# Patient Record
Sex: Female | Born: 1955 | Race: White | Hispanic: No | Marital: Married | Smoking: Never smoker
Health system: Southern US, Community
[De-identification: ages and names within clinical notes are randomized; demographics above are authoritative.]

## PROBLEM LIST (undated history)

## (undated) DIAGNOSIS — O99419 Diseases of the circulatory system complicating pregnancy, unspecified trimester: Secondary | ICD-10-CM

## (undated) DIAGNOSIS — E785 Hyperlipidemia, unspecified: Secondary | ICD-10-CM

## (undated) DIAGNOSIS — Z9889 Other specified postprocedural states: Secondary | ICD-10-CM

## (undated) DIAGNOSIS — R112 Nausea with vomiting, unspecified: Secondary | ICD-10-CM

## (undated) DIAGNOSIS — I341 Nonrheumatic mitral (valve) prolapse: Secondary | ICD-10-CM

## (undated) DIAGNOSIS — C50919 Malignant neoplasm of unspecified site of unspecified female breast: Secondary | ICD-10-CM

## (undated) DIAGNOSIS — M549 Dorsalgia, unspecified: Secondary | ICD-10-CM

## (undated) DIAGNOSIS — C801 Malignant (primary) neoplasm, unspecified: Secondary | ICD-10-CM

## (undated) DIAGNOSIS — M1990 Unspecified osteoarthritis, unspecified site: Secondary | ICD-10-CM

## (undated) DIAGNOSIS — G43109 Migraine with aura, not intractable, without status migrainosus: Secondary | ICD-10-CM

## (undated) DIAGNOSIS — K635 Polyp of colon: Secondary | ICD-10-CM

## (undated) HISTORY — DX: Polyp of colon: K63.5

## (undated) HISTORY — DX: Migraine with aura, not intractable, without status migrainosus: G43.109

## (undated) HISTORY — DX: Hyperlipidemia, unspecified: E78.5

## (undated) HISTORY — DX: Dorsalgia, unspecified: M54.9

## (undated) HISTORY — DX: Unspecified osteoarthritis, unspecified site: M19.90

---
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
## 1975-03-02 HISTORY — PX: RHINOPLASTY: SUR1284

## 1996-03-01 HISTORY — PX: ENDOMETRIAL ABLATION: SHX621

## 2001-05-04 ENCOUNTER — Other Ambulatory Visit: Admission: RE | Admit: 2001-05-04 | Discharge: 2001-05-04 | Payer: Self-pay | Admitting: Internal Medicine

## 2002-04-13 ENCOUNTER — Other Ambulatory Visit: Admission: RE | Admit: 2002-04-13 | Discharge: 2002-04-13 | Payer: Self-pay | Admitting: *Deleted

## 2003-01-28 ENCOUNTER — Ambulatory Visit (HOSPITAL_BASED_OUTPATIENT_CLINIC_OR_DEPARTMENT_OTHER): Admission: RE | Admit: 2003-01-28 | Discharge: 2003-01-28 | Payer: Self-pay | Admitting: Obstetrics and Gynecology

## 2003-01-28 ENCOUNTER — Ambulatory Visit (HOSPITAL_COMMUNITY): Admission: RE | Admit: 2003-01-28 | Discharge: 2003-01-28 | Payer: Self-pay | Admitting: Obstetrics and Gynecology

## 2003-04-18 ENCOUNTER — Other Ambulatory Visit: Admission: RE | Admit: 2003-04-18 | Discharge: 2003-04-18 | Payer: Self-pay | Admitting: *Deleted

## 2004-05-18 ENCOUNTER — Other Ambulatory Visit: Admission: RE | Admit: 2004-05-18 | Discharge: 2004-05-18 | Payer: Self-pay | Admitting: Obstetrics and Gynecology

## 2005-03-01 HISTORY — PX: KNEE SURGERY: SHX244

## 2005-05-19 ENCOUNTER — Other Ambulatory Visit: Admission: RE | Admit: 2005-05-19 | Discharge: 2005-05-19 | Payer: Self-pay | Admitting: Obstetrics & Gynecology

## 2005-06-07 ENCOUNTER — Ambulatory Visit (HOSPITAL_BASED_OUTPATIENT_CLINIC_OR_DEPARTMENT_OTHER): Admission: RE | Admit: 2005-06-07 | Discharge: 2005-06-07 | Payer: Self-pay | Admitting: Orthopedic Surgery

## 2006-06-16 ENCOUNTER — Other Ambulatory Visit: Admission: RE | Admit: 2006-06-16 | Discharge: 2006-06-16 | Payer: Self-pay | Admitting: Obstetrics & Gynecology

## 2006-08-31 ENCOUNTER — Encounter (INDEPENDENT_AMBULATORY_CARE_PROVIDER_SITE_OTHER): Payer: Self-pay | Admitting: Gastroenterology

## 2006-08-31 ENCOUNTER — Ambulatory Visit (HOSPITAL_COMMUNITY): Admission: RE | Admit: 2006-08-31 | Discharge: 2006-08-31 | Payer: Self-pay | Admitting: Gastroenterology

## 2007-06-15 ENCOUNTER — Ambulatory Visit (HOSPITAL_BASED_OUTPATIENT_CLINIC_OR_DEPARTMENT_OTHER): Admission: RE | Admit: 2007-06-15 | Discharge: 2007-06-15 | Payer: Self-pay | Admitting: Orthopedic Surgery

## 2007-09-21 ENCOUNTER — Other Ambulatory Visit: Admission: RE | Admit: 2007-09-21 | Discharge: 2007-09-21 | Payer: Self-pay | Admitting: Obstetrics and Gynecology

## 2010-07-14 NOTE — Op Note (Signed)
[name], [name]              ACCOUNT NO.:  [id number]   MEDICAL RECORD NO.:  [id number]          PATIENT TYPE:  AMB   LOCATION:  ENDO                         FACILITY:  [facility]   PHYSICIAN:  Anselmo Rod, M.D.  DATE OF BIRTH:  1955/08/11   DATE OF PROCEDURE:  08/31/2006  DATE OF DISCHARGE:  08/31/2006                               OPERATIVE REPORT   PROCEDURE PERFORMED:  Colonoscopy with snare polypectomy x 1.   ENDOSCOPIST:  Anselmo Rod MD.   INSTRUMENT USED:  Pentax video colonoscope.   INDICATIONS FOR PROCEDURE:  A 55 year old female undergoing a screening  colonoscopy to rule out colonic polyps, masses, etc.   PREPROCEDURE PREPARATION:  Informed consent was procured from the  patient. The patient was fasted for 8 hours prior to the procedure, and  prepped with a bottle of magnesium citrate and a gallon of NuLytely the  night prior to the procedure.  Risks and benefits of the procedure  including a 10% missed rate of cancer and polyp were discussed with the  patient as well.   PREPROCEDURE PHYSICAL:  VITAL SIGNS:  The patient had stable vital  signs.  NECK:  Supple.  CHEST:  Clear to auscultation.  HEART:  S1-S2 regular.  ABDOMEN:  Soft with normal bowel sounds.   DESCRIPTION OF PROCEDURE:  The patient was placed in the left lateral  decubitus position and sedated with 100 mcg of Fentanyl and 9 mg of  Versed given intravenously in slow incremental doses. Once the patient  was adequately sedated and maintained on low-flow oxygen and continuous  cardiac monitoring, the Pentax video colonoscope was advanced from the  rectum to the cecum. A small sessile polyp was removed by a hot snare  from the proximal right colon.  The appendiceal orifice and the cecal  valve were clearly visualized and photographed. The terminal ileum  appeared healthy without lesions. There was some residual stool in the  right colon and cecum. Multiple washes were done.  There was no  evidence  of diverticulosis. Retroflexion in the rectum revealed no abnormalities.  The patient tolerated the procedure well without complications.   IMPRESSION:  1. Small sessile polyp removed by hot snare from the proximal right      colon.  2. Otherwise unrevealing colonoscopy up to the terminal ileum.  3. Some residual stool in the right colon.  Multiple washes done.  4. No evidence of diverticulosis.   RECOMMENDATIONS:  1. Await pathology results.  2. Avoid all nonsteroidals including Aspirin for the next 2 weeks.  3. Repeat colonoscopy depending on pathology results.  4. Outpatient follow up as need arises in the future.      Anselmo Rod, M.D.  Electronically Signed     JNM/MEDQ  D:  11/15/2006  T:  11/15/2006  Job:  [id number]   cc:   Edwena Felty. Romine, M.D.

## 2010-07-14 NOTE — Op Note (Signed)
[name], [name]              ACCOUNT NO.:  [id number]   MEDICAL RECORD NO.:  [id number]          PATIENT TYPE:  AMB   LOCATION:  DSC                          FACILITY:  [facility]   PHYSICIAN:  Katy Fitch. Sypher, M.D. 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   POSTOPERATIVE DIAGNOSES:  1. 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  2. Identification of osteocartilaginous loose bodies within the right      thumb IP joint and 4+ synovitis.   OPERATION:  1. Debridement of mucoid cyst, dorsal aspect right thumb.  2. Right thumb interphalangeal joint arthrotomy with removal of      osteocartilaginous loose bodies, synovectomy and debridement of      marginal osteophytes.   OPERATING SURGEON:  Dr. Josephine Igo.   ASSISTANT:  No assistant.   ANESTHESIA:  General sedation/2% lidocaine metacarpal head level block  right thumb.   SUPERVISING ANESTHESIOLOGIST:  Dr. Gypsy Balsam.   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  She was referred through the  courtesy of Dr. Frazier Butt of [facility] for evaluation  of a chronic right thumb mucoid cyst.  Clinical examination revealed a  large  cyst measuring nearly 8 mm in diameter that had drained  repeatedly.  She had slight radial deviation of her right thumb IP joint  and signs of degenerative arthritis including palpable marginal  osteophytes.   X-rays at the office demonstrated an osteocartilaginous loose body in  the radial dorsal recess of the IP joint and narrowing of the radial  compartment of the IP joint.   We recommended that she proceed with joint debridement and cyst  excision.  After informed consent, she is brought to the operating room  at this time.   PROCEDURE:  [name] was brought to the operating room and placed  in supine position on the operating table.   Following light sedation, the right thumb was anesthetized with 2%  lidocaine with a block placed at the metacarpal head level.   After 10 minutes, excellent anesthesia was achieved.   The entire right hand and arm were prepped with Betadine soap solution  and sterilely draped.  The thumb was exsanguinated with a gauze wrap and  a 1/2-inch Penrose drain placed at metacarpal head  level as a digital  tourniquet.   Procedure commenced with a curvilinear incision incorporating the sinus  tract of the cyst distally.  Subcutaneous tissues were carefully divided  taking care to identify the extensor tendon, dorsal veins, and dorsal  sensory nerves.   The cyst was circumferentially dissected and removed with a micro  rongeur.  The periosteum on the dorsal aspect of the distal phalanx was  debrided.  The sinus tract was followed back to the radial aspect of the  joint.  The capsule between the extensor tendon, terminal slip, and the  radial collateral ligament was resected followed by use of a micro  rongeur and curette to remove osteocartilaginous loose bodies.  A  complete synovectomy of the dorsal aspect of the IP joint was  accomplished with micro rongeur followed by debridement of marginal  osteophytes.  A 19-gauge blunt dental needle was  then used to power  irrigate the joint with approximately 60 mL of sterile saline.   Thereafter, the wound was inspected for bleeding points followed by  repair of the skin with horizontal mattress sutures of 5-0 nylon.   The tourniquet was released and hemostasis achieved by direct pressure  for approximately 5 minutes.   The thumb was dressed with Xeroflo sterile gauze and a Coban thumb spica  dressing.  There are no apparent complications.   Aftercare, [name] got prescriptions for Keflex 500 mg 1 p.o. q.8 h  x3 days as a prophylactic antibiotic and Percocet 5/325 one p.o. q. 4-6  hours p.r.n. pain 20 tablets without refill.      Katy Fitch Sypher, M.D.  Electronically Signed     RVS/MEDQ  D:  06/15/2007  T:  06/16/2007  Job:  [id number]   cc:   Frazier Butt, D.O.

## 2010-07-17 NOTE — Op Note (Signed)
[name], [name]                          ACCOUNT NO.:  [id number]   MEDICAL RECORD NO.:  [id number]                   PATIENT TYPE:  AMB   LOCATION:  NESC                                 FACILITY:  [facility]   PHYSICIAN:  Cynthia P. Romine, M.D.             DATE OF BIRTH:  [birth year]   DATE OF PROCEDURE:  01/28/2003  DATE OF DISCHARGE:                                 OPERATIVE REPORT   PREOPERATIVE DIAGNOSIS:  Menorrhagia.   POSTOPERATIVE DIAGNOSIS:  Menorrhagia.   PROCEDURE:  Endometrial ablation using hydrothermal ablator.   SURGEON:  Cynthia P. Romine, M.D.   ANESTHESIA:  General by LMA.   ESTIMATED BLOOD LOSS:  Minimal.   COMPLICATIONS:  None.   DESCRIPTION OF PROCEDURE:  The patient was taken to the operating room and  after the induction of adequate general anesthesia was placed in the dorsal  lithotomy position and prepped and draped in the usual fashion.  The bladder  was drained with a red rubber catheter.  Posterior weighted and anterior  skin retractors were placed and the cervix was grasped on the anterior lip  with a single-tooth tenaculum.  The cervix was dilated to a #23 Shawnie Pons, the  hysteroscope was introduced, the tubal ostia were visualized, photographic  documentation was taken.  The scope was withdrawn to just inside the  internal os and endometrial ablation was carried out in the usual fashion.  There were no complications.  Upon completing the procedure, documentation  photography was taken again of the post ablated endometrial cavity and the  instruments removed from the vagina.  The patient tolerated it well and went  in satisfactory condition to post anesthesia recovery.                                               Cynthia P. Romine, M.D.    CPR/MEDQ  D:  01/28/2003  T:  01/28/2003  Job:  [id number]

## 2010-07-17 NOTE — Op Note (Signed)
[name], BALENTINE              ACCOUNT NO.:  [id number]   MEDICAL RECORD NO.:  [id number]          PATIENT TYPE:  AMB   LOCATION:  DSC                          FACILITY:  [facility]   PHYSICIAN:  Robert A. Thurston Hole, M.D. DATE OF BIRTH:  01/19/1956   DATE OF PROCEDURE:  06/07/2005  DATE OF DISCHARGE:                                 OPERATIVE REPORT   PREOPERATIVE DIAGNOSIS:  Right knee medial and lateral meniscal tears.   POSTOPERATIVE DIAGNOSIS:  Right knee medial and lateral meniscal tears.   PROCEDURE:  Right knee EUA followed by arthroscopic partial medial and  lateral meniscectomies.   SURGEON:  Elana Alm. Thurston Hole, M.D.   ASSISTANT:  Julien Girt, P.A.   ANESTHESIA:  Local MAC.   OPERATIVE TIME:  30 minutes.   COMPLICATIONS:  None.   INDICATIONS FOR PROCEDURE:  [name] is a 49-year woman who has had  significant right knee pain for the past 3-4 months increasing in nature  with exam and MRI documenting medial meniscus tear who has failed  conservative care and is now to undergo arthroscopy.   DESCRIPTION:  [name] was brought to operating room on 06/07/2005 after a  knee block was placed in holding room y anesthesia.  She was placed on the  operative table supine position.  Her right knee was examined.  She had a  full range of motion and her knee was stable to ligamentous exam with normal  patellar tracking.  The right leg was prepped using sterile DuraPrep and  draped using sterile technique.  Originally through an anterolateral portal  the arthroscope with a pump attached was placed and through an anteromedial  portal, an arthroscopic probe was placed.  On initial inspection of medial  compartment she had 20% grade 3 chondromalacia which was debrided.  Medial  meniscus tear posterior and medial horn of which 30-40% was resected back to  stable rim.  Intercondylar notch inspected.  Anterior and posterior cruciate  ligaments were normal.  Lateral compartment  inspected.  She had 25-30% grade  3 chondromalacia in the lateral tibial plateau which was debrided.  Lateral  femoral condyle articular cartilage was intact.  Lateral meniscus showed 15%  of partial tear posterolateral corner which was resected back to stable rim.  Patellofemoral joint showed mild grade 1 and 2 chondromalacia.  The patella  tracked normally.  Medial and lateral gutters were free of pathology.  After  this was done, it was felt that all pathology had been satisfactorily  addressed.  The instruments were removed.  Portals closed with 3-0 nylon  suture and injected with 0.25% Marcaine with epinephrine and 4 mg morphine.  Sterile dressings applied and the patient awakened and taken to recovery in  stable condition.   FOLLOW-UP CARE:  [name] will be followed as a outpatient on Percocet and  Naprosyn.  See her back in the office in a week for sutures out and follow-  up.      Robert A. Thurston Hole, M.D.  Electronically Signed     RAW/MEDQ  D:  06/07/2005  T:  06/07/2005  Job:  [id number] 

## 2010-11-24 LAB — POCT HEMOGLOBIN-HEMACUE: Hemoglobin: 15.5 — ABNORMAL HIGH

## 2011-07-29 ENCOUNTER — Other Ambulatory Visit: Payer: Self-pay | Admitting: Family Medicine

## 2011-07-29 DIAGNOSIS — Z1231 Encounter for screening mammogram for malignant neoplasm of breast: Secondary | ICD-10-CM

## 2011-08-09 ENCOUNTER — Ambulatory Visit: Payer: Self-pay

## 2012-05-29 ENCOUNTER — Ambulatory Visit (INDEPENDENT_AMBULATORY_CARE_PROVIDER_SITE_OTHER): Payer: 59 | Admitting: Family Medicine

## 2012-05-29 ENCOUNTER — Encounter: Payer: Self-pay | Admitting: Family Medicine

## 2012-05-29 VITALS — BP 110/75 | HR 60 | Resp 16 | Ht 66.0 in | Wt 149.0 lb

## 2012-05-29 DIAGNOSIS — M1990 Unspecified osteoarthritis, unspecified site: Secondary | ICD-10-CM

## 2012-05-29 NOTE — Progress Notes (Signed)
Subjective:     Patient ID: [name], female   DOB: 1955/12/25, 57 y.o.   MRN: [id number]  HPI [name] is here today to begin the [employer] Program.  She has tried numerous diet programs and has not been successful with them.  She feels that she needs to lose weight to improve her general health.  Her arthritis pain improves when she does the HCG diet.  She would also like to have a prescription for Voltaren Gel.  She has been using some sample tubes and says that it works very well for her. She applies it to her hands and to her back.    Review of Systems  Constitutional: Positive for unexpected weight change.  Musculoskeletal: Positive for myalgias, back pain, joint swelling and arthralgias.       Objective:   Physical Exam  Constitutional: She appears well-nourished. No distress.  Neck: No thyromegaly present.  Cardiovascular: Normal rate, regular rhythm and normal heart sounds.   Pulmonary/Chest: Effort normal and breath sounds normal.  Musculoskeletal: She exhibits tenderness. She exhibits no edema.  Skin: No rash noted.       Assessment:     Osteoarthritis    Plan:     She was given a prescription for Voltaren Gel. She is also going to do a round of the HCG diet.

## 2012-06-04 ENCOUNTER — Encounter: Payer: Self-pay | Admitting: Family Medicine

## 2012-06-04 DIAGNOSIS — M1990 Unspecified osteoarthritis, unspecified site: Secondary | ICD-10-CM | POA: Insufficient documentation

## 2012-06-04 MED ORDER — DICLOFENAC SODIUM 1 % TD GEL: 4.0000 g | Freq: Four times a day (QID) | TRANSDERMAL | Status: DC

## 2012-06-04 NOTE — Patient Instructions (Addendum)
1)  Osteoarthritis - Apply up to 4 grams of Voltaren Gel up to 2 painful areas up to 4 times per day.  2)  HCG Diet - Inject .15 cc IM daily for 30 days and eat 500 calories as per the provided food list.     Osteoarthritis Osteoarthritis is the most common form of arthritis. It is redness, soreness, and swelling (inflammation) affecting the cartilage. Cartilage acts as a cushion, covering the ends of bones where they meet to form a joint. CAUSES  Over time, the cartilage begins to wear away. This causes bone to rub on bone. This produces pain and stiffness in the affected joints. Factors that contribute to this problem are:  Excessive body weight.  Age.  Overuse of joints. SYMPTOMS   People with osteoarthritis usually experience joint pain, swelling, or stiffness.  Over time, the joint may lose its normal shape.  Small deposits of bone (osteophytes) may grow on the edges of the joint.  Bits of bone or cartilage can break off and float inside the joint space. This may cause more pain and damage.  Osteoarthritis can lead to depression, anxiety, feelings of helplessness, and limitations on daily activities. The most commonly affected joints are in the:  Ends of the fingers.  Thumbs.  Neck.  Lower back.  Knees.  Hips. DIAGNOSIS  Diagnosis is mostly based on your symptoms and exam. Tests may be helpful, including:  X-rays of the affected joint.  A computerized magnetic scan (MRI).  Blood tests to rule out other types of arthritis.  Joint fluid tests. This involves using a needle to draw fluid from the joint and examining the fluid under a microscope. TREATMENT  Goals of treatment are to control pain, improve joint function, maintain a normal body weight, and maintain a healthy lifestyle. Treatment approaches may include:  A prescribed exercise program with rest and joint relief.  Weight control with nutritional education.  Pain relief techniques such  as:  Properly applied heat and cold.  Electric pulses delivered to nerve endings under the skin (transcutaneous electrical nerve stimulation, TENS).  Massage.  Certain supplements. Ask your caregiver before using any supplements, especially in combination with prescribed drugs.  Medicines to control pain, such as:  Acetaminophen.  Nonsteroidal anti-inflammatory drugs (NSAIDs), such as naproxen.  Narcotic or central-acting agents, such as tramadol. This drug carries a risk of addiction and is generally prescribed for short-term use.  Corticosteroids. These can be given orally or as injection. This is a short-term treatment, not recommended for routine use.  Surgery to reposition the bones and relieve pain (osteotomy) or to remove loose pieces of bone and cartilage. Joint replacement may be needed in advanced states of osteoarthritis. HOME CARE INSTRUCTIONS  Your caregiver can recommend specific types of exercise. These may include:  Strengthening exercises. These are done to strengthen the muscles that support joints affected by arthritis. They can be performed with weights or with exercise bands to add resistance.  Aerobic activities. These are exercises, such as brisk walking or low-impact aerobics, that get your heart pumping. They can help keep your lungs and circulatory system in shape.  Range-of-motion activities. These keep your joints limber.  Balance and agility exercises. These help you maintain daily living skills. Learning about your condition and being actively involved in your care will help improve the course of your osteoarthritis. SEEK MEDICAL CARE IF:   You feel hot or your skin turns red.  You develop a rash in addition to your joint  pain.  You have an oral temperature above 102 F (38.9 C). FOR MORE INFORMATION  National Institute of Arthritis and Musculoskeletal and Skin Diseases: www.niams.http://www.myers.net/ [employer] on Aging: https://walker.com/ American  College of Rheumatology: www.rheumatology.org Document Released: 02/15/2005 Document Revised: 05/10/2011 Document Reviewed: 05/29/2009 [facility] Patient Information 2013 [place], [place].

## 2012-06-08 ENCOUNTER — Other Ambulatory Visit: Payer: Self-pay | Admitting: Family Medicine

## 2012-06-08 DIAGNOSIS — M1990 Unspecified osteoarthritis, unspecified site: Secondary | ICD-10-CM

## 2012-06-08 MED ORDER — DICLOFENAC SODIUM 1 % TD GEL: 4.0000 g | Freq: Four times a day (QID) | TRANSDERMAL | Status: DC

## 2012-06-23 ENCOUNTER — Telehealth: Payer: Self-pay | Admitting: *Deleted

## 2012-06-23 NOTE — Telephone Encounter (Signed)
PT WAS SEEN IN FEB DR ZANARD PRESCRIBED CREAM AND EXCERSISE FOR SCIATICA  PT SAID THAT IS GETTING DIFFICULT TO USE LEFT LEG CANT BEND OVER TO WASH FACE.  ITS GETTING WORSE PLEASE ADVISE. OR REFER. IF REFER OUT TO NEURO WANTS TO USE DR. ELSNER

## 2012-06-26 NOTE — Telephone Encounter (Signed)
Dr Zanard took care of this. PG  

## 2012-06-26 NOTE — Telephone Encounter (Signed)
Dr Alberteen Sam took care of this. PG

## 2012-06-26 NOTE — Telephone Encounter (Signed)
PT CALLED AGAIN IN REF. TO THIS CALL LAST WEEK. SHE IS WAITING TO HEAR BACK FROM YOU OR DR. ZANARD.

## 2012-07-25 ENCOUNTER — Telehealth: Payer: Self-pay | Admitting: *Deleted

## 2012-07-25 MED ORDER — OMEGA-3-ACID ETHYL ESTERS 1 G PO CAPS: 2.0000 g | ORAL_CAPSULE | Freq: Two times a day (BID) | ORAL | Status: DC

## 2012-07-25 MED ORDER — NAPROXEN 500 MG PO TABS: 500.0000 mg | ORAL_TABLET | Freq: Two times a day (BID) | ORAL | Status: DC

## 2012-07-25 NOTE — Telephone Encounter (Signed)
Patient has been notified that she will need to schedule an appointment for both her lab work and to see Dr. Camie Patience office. Mrs. Prestigiacomo stated that she will call back to schedule.

## 2012-08-21 ENCOUNTER — Other Ambulatory Visit: Payer: 59

## 2012-08-28 ENCOUNTER — Other Ambulatory Visit: Payer: Self-pay | Admitting: *Deleted

## 2012-08-28 ENCOUNTER — Other Ambulatory Visit: Payer: 59 | Admitting: Family Medicine

## 2012-08-28 LAB — CBC WITH DIFFERENTIAL/PLATELET
Basophils Absolute: 0 10*3/uL (ref 0.0–0.1)
Basophils Relative: 0 % (ref 0–1)
Eosinophils Absolute: 0.1 10*3/uL (ref 0.0–0.7)
Eosinophils Relative: 1 % (ref 0–5)
HCT: 38.7 % (ref 36.0–46.0)
Hemoglobin: 13.3 g/dL (ref 12.0–15.0)
Lymphocytes Relative: 28 % (ref 12–46)
Lymphs Abs: 1.3 10*3/uL (ref 0.7–4.0)
MCH: 32.9 pg (ref 26.0–34.0)
MCHC: 34.4 g/dL (ref 30.0–36.0)
MCV: 95.8 fL (ref 78.0–100.0)
Monocytes Absolute: 0.4 10*3/uL (ref 0.1–1.0)
Monocytes Relative: 8 % (ref 3–12)
Neutro Abs: 2.9 10*3/uL (ref 1.7–7.7)
Neutrophils Relative %: 63 % (ref 43–77)
Platelets: 213 10*3/uL (ref 150–400)
RBC: 4.04 MIL/uL (ref 3.87–5.11)
RDW: 12.4 % (ref 11.5–15.5)
WBC: 4.7 10*3/uL (ref 4.0–10.5)

## 2012-08-28 MED ORDER — PROGESTERONE MICRONIZED 100 MG PO CAPS: 100.0000 mg | ORAL_CAPSULE | Freq: Every day | ORAL | Status: DC

## 2012-08-28 NOTE — Telephone Encounter (Signed)
Patient has an appt on 8/11 for her medication refills. PG

## 2012-08-29 LAB — COMPLETE METABOLIC PANEL WITH GFR
ALT: 12 U/L (ref 0–35)
AST: 17 U/L (ref 0–37)
Albumin: 4.5 g/dL (ref 3.5–5.2)
Alkaline Phosphatase: 45 U/L (ref 39–117)
BUN: 10 mg/dL (ref 6–23)
CO2: 31 mEq/L (ref 19–32)
Calcium: 9.4 mg/dL (ref 8.4–10.5)
Chloride: 105 mEq/L (ref 96–112)
Creat: 0.66 mg/dL (ref 0.50–1.10)
GFR, Est African American: >89 mL/min
GFR, Est Non African American: >89 mL/min
Glucose, Bld: 92 mg/dL (ref 70–99)
Potassium: 4.4 mEq/L (ref 3.5–5.3)
Sodium: 141 mEq/L (ref 135–145)
Total Bilirubin: 0.8 mg/dL (ref 0.3–1.2)
Total Protein: 6 g/dL (ref 6.0–8.3)

## 2012-08-29 LAB — LIPID PANEL
Cholesterol: 204 mg/dL — ABNORMAL HIGH (ref 0–200)
HDL: 62 mg/dL (ref >39)
LDL Cholesterol: 124 mg/dL — ABNORMAL HIGH (ref 0–99)
Total CHOL/HDL Ratio: 3.3 Ratio
Triglycerides: 91 mg/dL (ref <150)
VLDL: 18 mg/dL (ref 0–40)

## 2012-08-29 LAB — TSH: TSH: 1.676 u[IU]/mL (ref 0.350–4.500)

## 2012-08-29 LAB — VITAMIN D 25 HYDROXY (VIT D DEFICIENCY, FRACTURES): Vit D, 25-Hydroxy: 36 ng/mL (ref 30–89)

## 2012-09-04 ENCOUNTER — Ambulatory Visit: Payer: 59 | Admitting: Family Medicine

## 2012-09-25 ENCOUNTER — Other Ambulatory Visit: Payer: Self-pay | Admitting: Family Medicine

## 2012-09-25 ENCOUNTER — Other Ambulatory Visit: Payer: 59

## 2012-10-02 ENCOUNTER — Ambulatory Visit (INDEPENDENT_AMBULATORY_CARE_PROVIDER_SITE_OTHER): Payer: 59 | Admitting: Family Medicine

## 2012-10-02 ENCOUNTER — Encounter: Payer: Self-pay | Admitting: Family Medicine

## 2012-10-02 VITALS — BP 103/67 | HR 65 | Wt 141.0 lb

## 2012-10-02 DIAGNOSIS — M1990 Unspecified osteoarthritis, unspecified site: Secondary | ICD-10-CM

## 2012-10-02 DIAGNOSIS — N951 Menopausal and female climacteric states: Secondary | ICD-10-CM

## 2012-10-02 DIAGNOSIS — E785 Hyperlipidemia, unspecified: Secondary | ICD-10-CM

## 2012-10-02 DIAGNOSIS — Z780 Asymptomatic menopausal state: Secondary | ICD-10-CM

## 2012-10-02 DIAGNOSIS — E559 Vitamin D deficiency, unspecified: Secondary | ICD-10-CM

## 2012-10-02 MED ORDER — PROGESTERONE MICRONIZED 100 MG PO CAPS: 100.0000 mg | ORAL_CAPSULE | Freq: Every day | ORAL | Status: DC

## 2012-10-02 MED ORDER — OMEGA-3-ACID ETHYL ESTERS 1 G PO CAPS: 2.0000 g | ORAL_CAPSULE | Freq: Two times a day (BID) | ORAL | Status: DC

## 2012-10-02 MED ORDER — NAPROXEN 500 MG PO TABS: 500.0000 mg | ORAL_TABLET | Freq: Two times a day (BID) | ORAL | Status: DC

## 2012-10-02 MED ORDER — ESTRADIOL 0.1 MG/24HR TD PTTW: 1.0000 | MEDICATED_PATCH | TRANSDERMAL | Status: DC

## 2012-10-02 NOTE — Progress Notes (Signed)
  Subjective:    Patient ID: [name], female    DOB: Oct 06, 1955, 57 y.o.   MRN: [id number]  HPI  [name] is here today to go over her most recent lab results and to discuss the conditions listed below:    1) Hyperlipidemia:  She continues taking her Lovaza and needs a prescription for it.   2) Vitamin D:  She has not been consistent with her Vitamin D OTC.  She needs to know if she should be on a prescription vs OTC.    3)  Osteoarthritis:  She continues to use a combination of Naproxen and Voltaren Gel.    4)  HRT: She is no longer wearing the Vivelle Dot.    Review of Systems  Constitutional: Negative for activity change, fatigue and unexpected weight change.  HENT: Negative.   Eyes: Negative.   Respiratory: Negative for shortness of breath.   Cardiovascular: Negative for chest pain, palpitations and leg swelling.  Gastrointestinal: Negative for diarrhea and constipation.  Endocrine: Negative.   Genitourinary: Negative for difficulty urinating.  Musculoskeletal: Negative.   Skin:       Dry Skin   Neurological: Negative.   Hematological: Negative for adenopathy. Does not bruise/bleed easily.  Psychiatric/Behavioral: Negative for sleep disturbance and dysphoric mood. The patient is not nervous/anxious.        Objective:   Physical Exam  Constitutional: She appears well-nourished. No distress.  HENT:  Head: Normocephalic.  Eyes: No scleral icterus.  Neck: No thyromegaly present.  Cardiovascular: Normal rate, regular rhythm and normal heart sounds.   Pulmonary/Chest: Effort normal and breath sounds normal.  Abdominal: There is no tenderness.  Musculoskeletal: She exhibits no edema and no tenderness.  Neurological: She is alert.  Skin: Skin is warm and dry.  Psychiatric: She has a normal mood and affect. Her behavior is normal. Judgment and thought content normal.          Assessment & Plan:

## 2012-10-08 ENCOUNTER — Encounter: Payer: Self-pay | Admitting: Family Medicine

## 2012-10-08 DIAGNOSIS — Z780 Asymptomatic menopausal state: Secondary | ICD-10-CM | POA: Insufficient documentation

## 2012-10-08 DIAGNOSIS — E785 Hyperlipidemia, unspecified: Secondary | ICD-10-CM | POA: Insufficient documentation

## 2012-10-08 DIAGNOSIS — E559 Vitamin D deficiency, unspecified: Secondary | ICD-10-CM | POA: Insufficient documentation

## 2012-10-08 NOTE — Assessment & Plan Note (Signed)
She will start taking OTC Vitamin D.

## 2012-10-08 NOTE — Assessment & Plan Note (Signed)
Refilled her arthritis medications.

## 2012-10-08 NOTE — Assessment & Plan Note (Signed)
Refilled her Vivelle Patch and the Prometrium.

## 2012-10-08 NOTE — Assessment & Plan Note (Addendum)
Her LDL is elevated at 124. It may improve with her going back on the Vivelle Dot.

## 2012-10-09 ENCOUNTER — Other Ambulatory Visit (HOSPITAL_COMMUNITY)
Admission: RE | Admit: 2012-10-09 | Discharge: 2012-10-09 | Disposition: A | Discharge status: Home / Self Care | Payer: Private Health Insurance - Indemnity | Source: Ambulatory Visit | Attending: Family Medicine | Admitting: Family Medicine

## 2012-10-09 ENCOUNTER — Encounter: Payer: Self-pay | Admitting: Family Medicine

## 2012-10-09 ENCOUNTER — Ambulatory Visit (INDEPENDENT_AMBULATORY_CARE_PROVIDER_SITE_OTHER): Payer: Private Health Insurance - Indemnity | Admitting: Family Medicine

## 2012-10-09 VITALS — BP 120/83 | HR 64 | Resp 16 | Ht 67.0 in | Wt 143.0 lb

## 2012-10-09 DIAGNOSIS — Z124 Encounter for screening for malignant neoplasm of cervix: Secondary | ICD-10-CM

## 2012-10-09 DIAGNOSIS — Z0000 Encounter for general adult medical examination without abnormal findings: Secondary | ICD-10-CM

## 2012-10-09 DIAGNOSIS — Z01419 Encounter for gynecological examination (general) (routine) without abnormal findings: Secondary | ICD-10-CM | POA: Insufficient documentation

## 2012-10-09 DIAGNOSIS — Z1151 Encounter for screening for human papillomavirus (HPV): Secondary | ICD-10-CM | POA: Insufficient documentation

## 2012-10-09 LAB — POCT URINALYSIS DIPSTICK
Bilirubin, UA: NEGATIVE
Blood, UA: NEGATIVE
Glucose, UA: NEGATIVE
Ketones, UA: NEGATIVE
Leukocytes, UA: NEGATIVE
Nitrite, UA: NEGATIVE
Protein, UA: NEGATIVE
Spec Grav, UA: 1.01
Urobilinogen, UA: NEGATIVE
pH, UA: 5

## 2012-10-09 NOTE — Progress Notes (Signed)
Subjective:    Patient ID: [name], female    DOB: Mar 06, 1955, 57 y.o.   MRN: [id number]  HPI  [name] is here today for her annual exam with PAP smear.  Overall, she feels in good health and has no medical concerns today.    Review of Systems  Constitutional: Negative.   HENT: Negative.   Eyes: Negative.   Respiratory: Negative.   Cardiovascular: Negative.   Gastrointestinal: Negative.   Endocrine: Negative.   Genitourinary: Negative.   Musculoskeletal: Positive for back pain.       She takes her naproxen when her pain worsens.   Skin: Negative.   Allergic/Immunologic: Negative.   Neurological: Negative.   Hematological: Negative.   Psychiatric/Behavioral: Negative.     Past Medical History  Diagnosis Date  . Migraine aura without headache   . Colon polyps   . Hyperlipidemia   . Arthritis   . Backache, unspecified    Family History  Problem Relation Age of Onset  . Arthritis Mother   . Cancer Mother     Colon   . Melanoma Father   . Cancer Maternal Grandmother     Breast   . Cancer Maternal Grandfather     Lung (Smoker)   . Hyperlipidemia Paternal Grandmother   . Cancer Paternal Grandfather     Lung (Smoker)     History   Social History Narrative   Marital Status:  Married Midwife)    Children:  G3 P3 Daughters (Lauren, Valma Cava, [place])    Pets:  Poodle [facility]); Cats Billey Gosling, Delorise Shiner)    Living Situation: Lives with spouse.  Her daughters live in [place].    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 She appears well-developed and well-nourished.  HENT:  Head:  Normocephalic and atraumatic.  Right Ear: External ear normal.  Left Ear: External ear normal.  Nose: Nose normal.  Mouth/Throat: Oropharynx is clear and moist.  Eyes: Conjunctivae and EOM are normal. Pupils are equal, round, and reactive to light.  Neck: Normal range of motion. No thyromegaly present.  Cardiovascular: Normal rate, regular rhythm, normal heart sounds and intact distal pulses.  Exam reveals no gallop and no friction rub.   No murmur heard. Pulmonary/Chest: Effort normal and breath sounds normal. Right breast exhibits no inverted nipple, no mass, no nipple discharge, no skin change and no tenderness. Left breast exhibits no inverted nipple, no mass, no nipple discharge, no skin change and no tenderness. Breasts are symmetrical.  Abdominal: Soft. Bowel sounds are normal. Hernia confirmed negative in the right inguinal area and confirmed negative in the left inguinal area.  Genitourinary: Vagina normal and uterus normal. Pelvic exam was performed with patient supine. There is no rash, tenderness or lesion on the right labia. There is no rash, tenderness or lesion on the left labia. No vaginal discharge found.  Musculoskeletal: Normal range of motion. She exhibits no edema and no tenderness.  Lymphadenopathy:    She has no cervical adenopathy.  Right: No inguinal adenopathy present.       Left: No inguinal adenopathy present.  Neurological: She is alert and oriented to person, place, and time. She has normal reflexes.  Skin: Skin is warm and dry.  Psychiatric: She has a normal mood and affect. Her behavior is normal. Judgment and thought content normal.          Assessment & Plan:

## 2012-11-25 DIAGNOSIS — Z124 Encounter for screening for malignant neoplasm of cervix: Secondary | ICD-10-CM | POA: Insufficient documentation

## 2012-11-25 DIAGNOSIS — Z0000 Encounter for general adult medical examination without abnormal findings: Secondary | ICD-10-CM | POA: Insufficient documentation

## 2012-11-25 DIAGNOSIS — Z719 Counseling, unspecified: Secondary | ICD-10-CM | POA: Insufficient documentation

## 2012-11-25 NOTE — Assessment & Plan Note (Signed)
Normal exam, she is up to date on her preventative issues.

## 2012-11-25 NOTE — Assessment & Plan Note (Signed)
Her pap was obtained without difficulty; Checking for HPV.

## 2013-07-03 ENCOUNTER — Ambulatory Visit (INDEPENDENT_AMBULATORY_CARE_PROVIDER_SITE_OTHER): Payer: Private Health Insurance - Indemnity | Admitting: Family Medicine

## 2013-07-03 ENCOUNTER — Encounter (INDEPENDENT_AMBULATORY_CARE_PROVIDER_SITE_OTHER): Payer: Self-pay

## 2013-07-03 ENCOUNTER — Encounter: Payer: Self-pay | Admitting: Family Medicine

## 2013-07-03 VITALS — BP 113/75 | HR 66 | Resp 16 | Ht 66.0 in | Wt 145.0 lb

## 2013-07-03 DIAGNOSIS — Z780 Asymptomatic menopausal state: Secondary | ICD-10-CM

## 2013-07-03 DIAGNOSIS — M159 Polyosteoarthritis, unspecified: Secondary | ICD-10-CM

## 2013-07-03 DIAGNOSIS — M150 Primary generalized (osteo)arthritis: Secondary | ICD-10-CM

## 2013-07-03 DIAGNOSIS — R635 Abnormal weight gain: Secondary | ICD-10-CM

## 2013-07-03 DIAGNOSIS — N951 Menopausal and female climacteric states: Secondary | ICD-10-CM

## 2013-07-03 MED ORDER — PHENDIMETRAZINE TARTRATE 35 MG PO TABS: 1.0000 | ORAL_TABLET | Freq: Three times a day (TID) | ORAL | Status: DC

## 2013-07-03 NOTE — Progress Notes (Signed)
Subjective:    Patient ID: [name], female    DOB: 1955/11/12, 58 y.o.   MRN: [id number]  HPI  [name] is here today to begin another round of the Step by Step program. She has done HCG in the past and has done well with it. She would like to lose 10 lbs.  She also needs to have her medications refilled.     Review of Systems  Constitutional: Negative for activity change, appetite change and unexpected weight change.  Cardiovascular: Negative for chest pain, palpitations and leg swelling.  All other systems reviewed and are negative.    Past Medical History  Diagnosis Date  . Migraine aura without headache   . Colon polyps   . Hyperlipidemia   . Arthritis   . Backache, unspecified      Past Surgical History  Procedure Laterality Date  . Rhinoplasty  1977  . Knee surgery Right 2007  . Tonsillectomy  1961  . Endometrial ablation  1998     History   Social History Narrative   Marital Status:  Married Psychologist, counselling)    Children:  G3 P3 Daughters (Lauren, Sherran Needs, [place])    Pets:  Poodle [facility]); Cats Eduard Clos, Shirlee Limerick)    Living Situation: Lives with spouse.  Her daughters live in [place].    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 Arthritis Mother   . Cancer Mother     Colon   . Melanoma Father   . Cancer Maternal Grandmother     Breast   . Cancer Maternal Grandfather     Lung (Smoker)   . Hyperlipidemia Paternal Grandmother   . Cancer Paternal Grandfather     Lung (Smoker)      Current Outpatient Prescriptions on File Prior to Visit  Medication Sig Dispense Refill  . estradiol (VIVELLE-DOT) 0.1 MG/24HR patch Place  1 patch (0.1 mg total) onto the skin 2 (two) times a week.  8 patch  11  . naproxen (NAPROSYN) 500 MG tablet Take 1 tablet (500 mg total) by mouth 2 (two) times daily with a meal.  60 tablet  11  . omega-3 acid ethyl esters (LOVAZA) 1 G capsule Take 2 capsules (2 g total) by mouth 2 (two) times daily.  120 capsule  11  . progesterone (PROMETRIUM) 100 MG capsule Take 1 capsule (100 mg total) by mouth daily.  30 capsule  11   No current facility-administered medications on file prior to visit.     No Known Allergies   Immunization History  Administered Date(s) Administered  . Tdap 10/13/2009       Objective:   Physical Exam  Nursing note and vitals reviewed. Constitutional: She appears well-nourished. No distress.  HENT:  Head: Normocephalic.  Eyes: No scleral icterus.  Neck: No thyromegaly present.  Cardiovascular: Normal rate, regular rhythm and normal heart sounds.   Pulmonary/Chest: Effort normal and breath sounds normal.  Abdominal: There is no tenderness.  Musculoskeletal: She exhibits no edema and no tenderness.  Neurological: She is alert.  Skin: Skin  is warm and dry.  Psychiatric: She has a normal mood and affect. Her behavior is normal. Judgment and thought content normal.       Assessment & Plan:   [name] was seen today for weight gain.  Diagnoses and associated orders for this visit:  Menopause - progesterone (PROMETRIUM) 100 MG capsule; Take 1 capsule (100 mg total) by mouth at bedtime. - estradiol (VIVELLE-DOT) 0.1 MG/24HR patch; Place 1 patch (0.1 mg total) onto the skin 2 (two) times a week.  Weight gain Comments: A prescription for HCG was faxed to [facility]. She was also given a prescription for phendimetrazine.   - Phendimetrazine Tartrate 35 MG TABS; Take 1 tablet (35 mg total) by mouth 3 (three) times daily.  Primary osteoarthritis involving multiple joints - omega-3 acid ethyl esters (LOVAZA) 1 G capsule; Take 2 capsules (2 g total)  by mouth 2 (two) times daily. - naproxen (NAPROSYN) 500 MG tablet; Take 1 tablet (500 mg total) by mouth 2 (two) times daily with a meal.

## 2013-09-03 LAB — HM MAMMOGRAPHY

## 2013-09-04 ENCOUNTER — Encounter: Payer: Self-pay | Admitting: *Deleted

## 2013-09-09 DIAGNOSIS — R635 Abnormal weight gain: Secondary | ICD-10-CM | POA: Insufficient documentation

## 2013-09-09 MED ORDER — ESTRADIOL 0.1 MG/24HR TD PTTW: 1.0000 | MEDICATED_PATCH | TRANSDERMAL | Status: AC

## 2013-09-09 MED ORDER — NAPROXEN 500 MG PO TABS: 500.0000 mg | ORAL_TABLET | Freq: Two times a day (BID) | ORAL | Status: AC

## 2013-09-09 MED ORDER — PROGESTERONE MICRONIZED 100 MG PO CAPS: 100.0000 mg | ORAL_CAPSULE | Freq: Every day | ORAL | Status: DC

## 2013-09-09 MED ORDER — OMEGA-3-ACID ETHYL ESTERS 1 G PO CAPS: 2.0000 g | ORAL_CAPSULE | Freq: Two times a day (BID) | ORAL | Status: DC

## 2019-01-10 ENCOUNTER — Other Ambulatory Visit: Payer: Self-pay | Admitting: Radiology

## 2019-01-12 ENCOUNTER — Other Ambulatory Visit: Payer: Self-pay | Admitting: *Deleted

## 2019-01-12 ENCOUNTER — Telehealth: Payer: Self-pay | Admitting: Oncology

## 2019-01-12 ENCOUNTER — Encounter: Payer: Self-pay | Admitting: *Deleted

## 2019-01-12 DIAGNOSIS — D0511 Intraductal carcinoma in situ of right breast: Secondary | ICD-10-CM | POA: Insufficient documentation

## 2019-01-12 NOTE — Telephone Encounter (Signed)
Called patient and sent email in reference to [facility] for 11/18, patient wants afternoon appointment, packet sent through [facility], will send reminder letter

## 2019-01-15 ENCOUNTER — Encounter: Payer: Self-pay | Admitting: Licensed Clinical Social Worker

## 2019-01-16 NOTE — Progress Notes (Signed)
[name]  [phone] [phone]) [phone]     ID: [name] DOB: [birth year]  MR#: [id number]  [id number]  Patient Care Team: Jonathon Resides, MD as PCP - General (Family Medicine) Mauro Kaufmann, RN as Oncology Nurse Navigator Rockwell Germany, RN as Oncology Nurse Navigator [name], MD as Consulting Physician (General Surgery) Magrinat, Virgie Dad, MD as Consulting Physician (Oncology) Eppie Gibson, MD as Attending Physician (Radiation Oncology) Juanita Craver, MD as Consulting Physician (Gastroenterology) Allyn Kenner, MD as Consulting Physician (Dermatology) Chauncey Cruel, MD OTHER MD:  CHIEF COMPLAINT: 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 She underwent right diagnostic mammography with tomography at [facility] on 01/02/2019 showing: breast density category C; 0.8 cm grouped amorphous calcifications in the upper-outer right breast; 1.6 cm grouped pleomorphic calcifications at 7 o'clock in the right breast. Right axilla ultrasound performed on 01/10/2019 at [facility] was negative for malignancy.  Accordingly on 01/10/2019 she proceeded to biopsy of the lower-outer right breast 1.6 cm area in question. The pathology from this procedure [id number]) showed: ductal carcinoma in situ with calcifications and necrosis, high grade. Prognostic indicators significant for: estrogen receptor, 0% negative and progesterone receptor, 0% negative.  This second smaller area was normal.  Biopsy and on review it was felt to be "likely benign".  MRI was suggested for further evaluation.  The patient's subsequent history is as detailed below.   INTERVAL HISTORY: Ozell was evaluated in the multidisciplinary breast cancer clinic on 01/17/2019  accompanied by her daughter Raynaldo Opitz. Her case was also presented at the multidisciplinary breast cancer conference on the same day. 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 Danielle Dess.   REVIEW OF SYSTEMS: Kairy reports wearing glasses/contacts and irregular heartbeat. There were no specific symptoms leading to the original mammogram, which was routinely scheduled. The patient denies unusual headaches, visual changes, nausea, vomiting, stiff neck, dizziness, or gait imbalance. There has been no cough, phlegm production, or pleurisy, no chest pain or pressure, and no change in bowel or bladder habits. The patient denies fever, rash, bleeding, unexplained fatigue or unexplained weight loss. A detailed review of systems was otherwise entirely negative.   PAST MEDICAL HISTORY: Past Medical History:  Diagnosis Date  . Arthritis   . Backache, unspecified   . Colon polyps   . Hyperlipidemia   . Migraine aura without headache     PAST SURGICAL HISTORY: Past Surgical History:  Procedure Laterality Date  . ENDOMETRIAL ABLATION  1998  . KNEE SURGERY Right 2007  . RHINOPLASTY  1977  . TONSILLECTOMY  1961    FAMILY HISTORY: Family History  Problem Relation Age of Onset  . Arthritis Mother   . Cancer Mother        Colon   . Melanoma Father   . Cancer Maternal Grandmother        Breast   . Colon cancer Maternal Grandmother   . Cancer Maternal Grandfather        Lung (Smoker)   . Hyperlipidemia Paternal Grandmother   . Cancer Paternal Grandfather  Lung (Smoker)    Patient's parents are both currently living at age 63 (as of 12/2018). The patient reports her maternal grandmother was diagnosed with breast cancer in her late 44s or  early 40s. The patient denies a family hx of ovarian, pancreatic, and prostate cancer. She does report colon cancer in her maternal grandmother (age of dx unknown), colon cancer in her mother at age 38, and melanoma in her father around age 37. She has 2 sisters, no brothers.   GYNECOLOGIC HISTORY:  No LMP recorded. Patient has had an ablation. Menarche: 63 years old Age at first live birth: 63 years old [place] P 3 LMP in 2002 Contraceptive: used for 14-15 years with no complications HRT: used for approximately 2 years and stopped approximately 2010 Hysterectomy? no BSO? no   SOCIAL HISTORY: (updated 12/2018)  Consepcion works as a Biochemist, clinical for [employer], an Location manager. She is married. Husband Eduard Clos "Harrie Jeans" is also a Biochemist, clinical for [employer].  At home is just she and her husband.  Daughter Pearson Grippe, age 10, lives in [place] as a Psychologist, counselling. Daughter Georges Mouse, age 80, also lives in [place] and is studying towards her MS in counseling. Daughter Derian Urdahl, age 53, lives in [place], [place] and is working towards her PhD in [employer] (sharks).  The patient attends [employer] DIRECTIVES: In the absence of any documents to the contrary the patient's husband is her healthcare power of attorney   HEALTH MAINTENANCE: Social History   Tobacco Use  . Smoking status: Never Smoker  . Smokeless tobacco: Never Used  Substance Use Topics  . Alcohol use: Yes    Comment: She drinks occasionally   . Drug use: No     Colonoscopy: 11/2017, Dr. Collene Mares, repeat in 5 years  PAP: 12/2018  Bone density: 12/2016, normal   No Known Allergies  Current Outpatient Medications  Medication Sig Dispense Refill  . Cholecalciferol (VITAMIN D-3) 125 MCG (5000 UT) TABS Take by mouth daily.    . famotidine (PEPCID) 40 MG tablet Take 40 mg by mouth daily.    Marland Kitchen omega-3 acid ethyl esters (LOVAZA) 1 g capsule Take 1 g by mouth 2 (two) times daily.    . Multiple  Vitamin (MULTI-VITAMIN DAILY PO) Take by mouth.    . omega-3 acid ethyl esters (LOVAZA) 1 G capsule Take 2 capsules (2 g total) by mouth 2 (two) times daily. 120 capsule 11   No current facility-administered medications for this visit.     OBJECTIVE: Middle-aged white woman in no acute distress  Vitals:   01/17/19 1316  BP: 128/80  Pulse: 99  Resp: 18  Temp: 98 F (36.7 C)  SpO2: 100%     Body mass index is 23.89 kg/m.   Wt Readings from Last 3 Encounters:  01/17/19 148 lb (67.1 kg)  07/03/13 145 lb (65.8 kg)  10/09/12 143 lb (64.9 kg)      ECOG FS:1 - Symptomatic but completely ambulatory  Ocular: Sclerae unicteric, pupils round and equal Ear-nose-throat: Wearing a mask Lymphatic: No cervical or supraclavicular adenopathy Lungs no rales or rhonchi Heart regular rate and rhythm Abd soft, nontender, positive bowel sounds MSK no focal spinal tenderness, no joint edema Neuro: non-focal, well-oriented, appropriate affect Breasts: The right breast is status post recent biopsy.  There is a mild ecchymosis.  The left breast is lumpy particularly in the upper inner quadrant, but not otherwise remarkable.  Both axillae are  benign   LAB RESULTS:  CMP     Component Value Date/Time   NA 143 01/17/2019 1301   K 4.4 01/17/2019 1301   CL 103 01/17/2019 1301   CO2 28 01/17/2019 1301   GLUCOSE 93 01/17/2019 1301   BUN 15 01/17/2019 1301   CREATININE 0.91 01/17/2019 1301   CREATININE 0.66 08/28/2012 1400   CALCIUM 9.8 01/17/2019 1301   PROT 7.2 01/17/2019 1301   ALBUMIN 4.6 01/17/2019 1301   AST 22 01/17/2019 1301   ALT 17 01/17/2019 1301   ALKPHOS 69 01/17/2019 1301   BILITOT 0.9 01/17/2019 1301   GFRNONAA >60 01/17/2019 1301   GFRNONAA >89 08/28/2012 1400   GFRAA >60 01/17/2019 1301   GFRAA >89 08/28/2012 1400    No results found for: TOTALPROTELP, ALBUMINELP, A1GS, A2GS, BETS, BETA2SER, GAMS, MSPIKE, SPEI  No results found for: KPAFRELGTCHN, LAMBDASER, KAPLAMBRATIO   Lab Results  Component Value Date   WBC 8.8 01/17/2019   NEUTROABS 6.6 01/17/2019   HGB 14.3 01/17/2019   HCT 41.7 01/17/2019   MCV 101.0 (H) 01/17/2019   PLT 224 01/17/2019    No results found for: LABCA2  No components found for: [id number]  No results for input(s): INR in the last 168 hours.  No results found for: LABCA2  No results found for: [id number]  No results found for: [id number]  No results found for: [id number]  No results found for: CA2729  No components found for: HGQUANT  No results found for: CEA1 / No results found for: CEA1   No results found for: AFPTUMOR  No results found for: [place]  No results found for: PSA1  Appointment on 01/17/2019  Component Date Value Ref Range Status  . Sodium 01/17/2019 143  135 - 145 mmol/L Final  . Potassium 01/17/2019 4.4  3.5 - 5.1 mmol/L Final  . Chloride 01/17/2019 103  98 - 111 mmol/L Final  . CO2 01/17/2019 28  22 - 32 mmol/L Final  . Glucose, Bld 01/17/2019 93  70 - 99 mg/dL Final  . BUN 01/17/2019 15  8 - 23 mg/dL Final  . Creatinine 01/17/2019 0.91  0.44 - 1.00 mg/dL Final  . Calcium 01/17/2019 9.8  8.9 - 10.3 mg/dL Final  . Total Protein 01/17/2019 7.2  6.5 - 8.1 g/dL Final  . Albumin 01/17/2019 4.6  3.5 - 5.0 g/dL Final  . AST 01/17/2019 22  15 - 41 U/L Final  . ALT 01/17/2019 17  0 - 44 U/L Final  . Alkaline Phosphatase 01/17/2019 69  38 - 126 U/L Final  . Total Bilirubin 01/17/2019 0.9  0.3 - 1.2 mg/dL Final  . GFR, Est Non Af Am 01/17/2019 >60  >60 mL/min Final  . GFR, Est AFR Am 01/17/2019 >60  >60 mL/min Final  . Anion gap 01/17/2019 12  5 - 15 Final   Performed at [facility], [place] [address], [place], [place] [address]  . WBC Count 01/17/2019 8.8  4.0 - 10.5 K/uL Final  . RBC 01/17/2019 4.13  3.87 - 5.11 MIL/uL Final  . Hemoglobin 01/17/2019 14.3  12.0 - 15.0 g/dL Final  . HCT 01/17/2019 41.7  36.0 - 46.0 % Final  . MCV 01/17/2019 101.0* 80.0 - 100.0 fL Final  . MCH  01/17/2019 34.6* 26.0 - 34.0 pg Final  . MCHC 01/17/2019 34.3  30.0 - 36.0 g/dL Final  . RDW 01/17/2019 11.7  11.5 - 15.5 % Final  . Platelet Count 01/17/2019 224  150 -  400 K/uL Final  . nRBC 01/17/2019 0.0  0.0 - 0.2 % Final  . Neutrophils Relative % 01/17/2019 76  % Final  . Neutro Abs 01/17/2019 6.6  1.7 - 7.7 K/uL Final  . Lymphocytes Relative 01/17/2019 17  % Final  . Lymphs Abs 01/17/2019 1.5  0.7 - 4.0 K/uL Final  . Monocytes Relative 01/17/2019 7  % Final  . Monocytes Absolute 01/17/2019 0.6  0.1 - 1.0 K/uL Final  . Eosinophils Relative 01/17/2019 0  % Final  . Eosinophils Absolute 01/17/2019 0.0  0.0 - 0.5 K/uL Final  . Basophils Relative 01/17/2019 0  % Final  . Basophils Absolute 01/17/2019 0.0  0.0 - 0.1 K/uL Final  . Immature Granulocytes 01/17/2019 0  % Final  . Abs Immature Granulocytes 01/17/2019 0.02  0.00 - 0.07 K/uL Final   Performed at [facility], [address] [place], [place] [address]    (this displays the last labs from the last 3 days)  No results found for: TOTALPROTELP, ALBUMINELP, A1GS, A2GS, BETS, BETA2SER, GAMS, MSPIKE, SPEI (this displays SPEP labs)  No results found for: KPAFRELGTCHN, LAMBDASER, KAPLAMBRATIO (kappa/lambda light chains)  No results found for: HGBA, HGBA2QUANT, HGBFQUANT, HGBSQUAN (Hemoglobinopathy evaluation)   No results found for: LDH  No results found for: IRON, TIBC, IRONPCTSAT (Iron and TIBC)  No results found for: FERRITIN  Urinalysis    Component Value Date/Time   BILIRUBINUR Neg 10/09/2012 1206   PROTEINUR Neg 10/09/2012 1206   UROBILINOGEN negative 10/09/2012 1206   NITRITE Neg 10/09/2012 1206   LEUKOCYTESUR Negative 10/09/2012 1206     STUDIES: No results found.  ELIGIBLE FOR AVAILABLE RESEARCH PROTOCOL: no  ASSESSMENT: 63 y.o. 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 Specifically we reviewed the biology of the patient's diagnosis and the specifics of her situation.  Chanika understands that in noninvasive ductal carcinoma, also called ductal carcinoma in situ ("DCIS") the breast cancer cells remain trapped in the ducts were they started. They cannot travel to a vital organ. For that reason these cancers in themselves are not life-threatening.  If the whole breast is removed then all the ducts are removed and since the cancer cells are trapped in the ducts, the cure rate with mastectomy for noninvasive breast cancer is approximately 99%. Nevertheless we usually recommend lumpectomy, because there is no survival advantage to mastectomy and because the cosmetic result is generally superior with breast conservation.  However in Christinea's case, with the second area that may need to be removed, it may be that for cosmesis reasons a mastectomy will be necessary  If the patient does keep her breast, there will be some risk of recurrence. The recurrence can only be in the same breast since, again, the cells are trapped in the ducts. There is no connection from one breast to the other. The risk of local recurrence is cut by more than half with radiation, which is standard in this situation.  In estrogen receptor positive cancers anti-estrogens can also be considered.  That is  not the case here however.  The patient also understands that we never use chemotherapy and noninvasive breast cancer.  Accordingly the overall plan is for surgery, followed by radiation, then observation.  I  plan to see her at least 1 more time sometime in May 2021, to clear out any residual questions she may also discussed the adjustments that even patients with noninvasive disease experience following the diagnosis and treatment of breast cancer.  Zuleimy has a good understanding of the overall plan. She agrees with it. She knows the goal of treatment in her case is cure. She will call with any problems that may develop before her next visit here.   Chauncey Cruel, MD   01/17/2019 2:37 PM Medical Oncology and Hematology [facility] [place], [place] [address] Tel. [phone]    Fax. [phone]   This document serves as a record of services personally performed by Lurline Del, MD. It was created on his behalf by Wilburn Mylar, a trained medical scribe. The creation of this record is based on the scribe's personal observations and the provider's statements to them.   I, Lurline Del MD, have reviewed the above documentation for accuracy and completeness, and I agree with the above.

## 2019-01-17 ENCOUNTER — Ambulatory Visit: Payer: Private Health Insurance - Indemnity | Admitting: Radiation Oncology

## 2019-01-17 ENCOUNTER — Telehealth: Payer: Self-pay | Admitting: Oncology

## 2019-01-17 ENCOUNTER — Inpatient Hospital Stay: Payer: PRIVATE HEALTH INSURANCE | Attending: Oncology | Admitting: Oncology

## 2019-01-17 ENCOUNTER — Ambulatory Visit: Payer: Self-pay | Admitting: Physical Therapy

## 2019-01-17 ENCOUNTER — Inpatient Hospital Stay: Payer: PRIVATE HEALTH INSURANCE

## 2019-01-17 ENCOUNTER — Ambulatory Visit
Admission: RE | Admit: 2019-01-17 | Discharge: 2019-01-17 | Disposition: A | Discharge status: Home / Self Care | Payer: PRIVATE HEALTH INSURANCE | Source: Ambulatory Visit | Attending: Radiation Oncology | Admitting: Radiation Oncology

## 2019-01-17 ENCOUNTER — Encounter: Payer: Self-pay | Admitting: Radiation Oncology

## 2019-01-17 ENCOUNTER — Other Ambulatory Visit: Payer: Self-pay

## 2019-01-17 ENCOUNTER — Other Ambulatory Visit: Payer: Self-pay | Admitting: *Deleted

## 2019-01-17 ENCOUNTER — Encounter: Payer: Self-pay | Admitting: Oncology

## 2019-01-17 ENCOUNTER — Encounter: Payer: Self-pay | Admitting: General Practice

## 2019-01-17 VITALS — BP 128/80 | HR 99 | Temp 98.0°F | Resp 18 | Ht 66.0 in | Wt 148.0 lb

## 2019-01-17 DIAGNOSIS — D0511 Intraductal carcinoma in situ of right breast: Secondary | ICD-10-CM

## 2019-01-17 LAB — CBC WITH DIFFERENTIAL (CANCER CENTER ONLY)
Abs Immature Granulocytes: 0.02 10*3/uL (ref 0.00–0.07)
Basophils Absolute: 0 10*3/uL (ref 0.0–0.1)
Basophils Relative: 0 %
Eosinophils Absolute: 0 10*3/uL (ref 0.0–0.5)
Eosinophils Relative: 0 %
HCT: 41.7 % (ref 36.0–46.0)
Hemoglobin: 14.3 g/dL (ref 12.0–15.0)
Immature Granulocytes: 0 %
Lymphocytes Relative: 17 %
Lymphs Abs: 1.5 10*3/uL (ref 0.7–4.0)
MCH: 34.6 pg — ABNORMAL HIGH (ref 26.0–34.0)
MCHC: 34.3 g/dL (ref 30.0–36.0)
MCV: 101 fL — ABNORMAL HIGH (ref 80.0–100.0)
Monocytes Absolute: 0.6 10*3/uL (ref 0.1–1.0)
Monocytes Relative: 7 %
Neutro Abs: 6.6 10*3/uL (ref 1.7–7.7)
Neutrophils Relative %: 76 %
Platelet Count: 224 10*3/uL (ref 150–400)
RBC: 4.13 MIL/uL (ref 3.87–5.11)
RDW: 11.7 % (ref 11.5–15.5)
WBC Count: 8.8 10*3/uL (ref 4.0–10.5)
nRBC: 0 % (ref 0.0–0.2)

## 2019-01-17 LAB — CMP (CANCER CENTER ONLY)
ALT: 17 U/L (ref 0–44)
AST: 22 U/L (ref 15–41)
Albumin: 4.6 g/dL (ref 3.5–5.0)
Alkaline Phosphatase: 69 U/L (ref 38–126)
Anion gap: 12 (ref 5–15)
BUN: 15 mg/dL (ref 8–23)
CO2: 28 mmol/L (ref 22–32)
Calcium: 9.8 mg/dL (ref 8.9–10.3)
Chloride: 103 mmol/L (ref 98–111)
Creatinine: 0.91 mg/dL (ref 0.44–1.00)
GFR, Est AFR Am: >60 mL/min (ref >60)
GFR, Estimated: >60 mL/min (ref >60)
Glucose, Bld: 93 mg/dL (ref 70–99)
Potassium: 4.4 mmol/L (ref 3.5–5.1)
Sodium: 143 mmol/L (ref 135–145)
Total Bilirubin: 0.9 mg/dL (ref 0.3–1.2)
Total Protein: 7.2 g/dL (ref 6.5–8.1)

## 2019-01-17 NOTE — Progress Notes (Signed)
Radiation Oncology         [phone] ________________________________  Initial outpatient Consultation  Name: [name] MRN: [id number]  Date: 01/17/2019  DOB: 07-15-1955  [id number], [name], MD  [name], MD   REFERRING PHYSICIAN: [name], MD  DIAGNOSIS:    ICD-10-CM   1. Ductal carcinoma in situ (DCIS) of right breast  D05.11   Cancer Staging Ductal carcinoma in situ (DCIS) of right breast Staging form: Breast, AJCC 8th Edition - Clinical stage from 01/17/2019: Stage 0 (cTis (DCIS), cN0, cM0, ER-, PR-) - Unsigned   CHIEF COMPLAINT: Here to discuss management of pre-breast cancer  HISTORY OF PRESENT ILLNESS::[name] is a 63 y.o. female who presented with 2 areas of suspicious calcifications in the right breast on mammography, the area in the right breast upper outer quadrant measuring 8 mm, and the area at 7:00 of the right breast measures 1.6 centimeters.  Right axillary ultrasound was negative. Biopsy in the lower outer quadrant revealed DCIS with necrosis which is ER/PR negative, grade 3.     PREVIOUS RADIATION THERAPY: No  PAST MEDICAL HISTORY:  has a past medical history of Arthritis, Backache, unspecified, Colon polyps, Hyperlipidemia, and Migraine aura without headache.    PAST SURGICAL HISTORY: Past Surgical History:  Procedure Laterality Date  . ENDOMETRIAL ABLATION  1998  . KNEE SURGERY Right 2007  . RHINOPLASTY  1977  . TONSILLECTOMY  1961    FAMILY HISTORY: family history includes Arthritis in her mother; Cancer in her maternal grandfather, maternal grandmother, mother, and paternal grandfather; Colon cancer in her maternal grandmother; Hyperlipidemia in her paternal grandmother; Melanoma in her father.  Patient reports that her maternal grandmother had breast cancer postmenopausally;  her mother and her maternal great grandmother had colon cancer postmenopausally.  Our nurse navigators are discussing this with genetics to see if the  patient is eligible for consultation/testing.  SOCIAL HISTORY:  reports that she has never smoked. She has never used smokeless tobacco. She reports current alcohol use. She reports that she does not use drugs.  ALLERGIES: Patient has no known allergies.  MEDICATIONS:  Current Outpatient Medications  Medication Sig Dispense Refill  . Cholecalciferol (VITAMIN D-3) 125 MCG (5000 UT) TABS Take by mouth daily.    . famotidine (PEPCID) 40 MG tablet Take 40 mg by mouth daily.    . Multiple Vitamin (MULTI-VITAMIN DAILY PO) Take by mouth.    . omega-3 acid ethyl esters (LOVAZA) 1 G capsule Take 2 capsules (2 g total) by mouth 2 (two) times daily. 120 capsule 11  . omega-3 acid ethyl esters (LOVAZA) 1 g capsule Take 1 g by mouth 2 (two) times daily.     No current facility-administered medications for this encounter.     REVIEW OF SYSTEMS: As above  PHYSICAL EXAM:   General: Alert and oriented, in no acute distress Psychiatric: Judgment and insight are intact. Affect is appropriate. Breasts: post-biopsy mass palpated at lower outer right breast . No other palpable masses appreciated in the breasts or axillae bilaterally.  ECOG = 0  0 - Asymptomatic (Fully active, able to carry on all predisease activities without restriction)  1 - Symptomatic but completely ambulatory (Restricted in physically strenuous activity but ambulatory and able to carry out work of a light or sedentary nature. For example, light housework, office work)  2 - Symptomatic, <50% in bed during the day (Ambulatory and capable of all self care but unable to carry out any work activities. Up  and about more than 50% of waking hours)  3 - Symptomatic, >50% in bed, but not bedbound (Capable of only limited self-care, confined to bed or chair 50% or more of waking hours)  4 - Bedbound (Completely disabled. Cannot carry on any self-care. Totally confined to bed or chair)  5 - Death   Eustace Pen MM, Creech RH, Tormey DC, et al.  [phone]). "Toxicity and response criteria of the [facility]". [place] Oncol. 5 (6): 649-55   LABORATORY DATA:  Lab Results  Component Value Date   WBC 8.8 01/17/2019   HGB 14.3 01/17/2019   HCT 41.7 01/17/2019   MCV 101.0 (H) 01/17/2019   PLT 224 01/17/2019   CMP     Component Value Date/Time   NA 143 01/17/2019 1301   K 4.4 01/17/2019 1301   CL 103 01/17/2019 1301   CO2 28 01/17/2019 1301   GLUCOSE 93 01/17/2019 1301   BUN 15 01/17/2019 1301   CREATININE 0.91 01/17/2019 1301   CREATININE 0.66 08/28/2012 1400   CALCIUM 9.8 01/17/2019 1301   PROT 7.2 01/17/2019 1301   ALBUMIN 4.6 01/17/2019 1301   AST 22 01/17/2019 1301   ALT 17 01/17/2019 1301   ALKPHOS 69 01/17/2019 1301   BILITOT 0.9 01/17/2019 1301   GFRNONAA >60 01/17/2019 1301   GFRNONAA >89 08/28/2012 1400   GFRAA >60 01/17/2019 1301   GFRAA >89 08/28/2012 1400      RADIOGRAPHY:  As above  IMPRESSION/PLAN: DCIS, right breast, ER negative  It was a pleasure meeting the patient today. She is undergoing MRI to figure out if breast conserving surgery will be feasible. She is interested in this, if possible, but also still considering mastectomy.    Our nurse navigators will circle back to genetics to see if a consulation is warranted, given her family history.  After I answered her questions today, she seems more comfortable about considering breast conservation and adjuvant RT.  We discussed the risks, benefits, and side effects of radiotherapy after lumpectomy(s).  We discussed that radiation would take approximately 4 weeks to complete and that I would give the patient a few weeks to heal following surgery before starting treatment planning. We spoke about acute effects including skin irritation and fatigue as well as much less common late effects including internal organ injury or irritation. We spoke about the latest technology that is used to minimize the risk of late effects for  patients undergoing radiotherapy to the breast or chest wall. No guarantees of treatment were given.   I will await her referral back to me for postoperative follow-up and eventual CT simulation/treatment planning as warranted.  I spent 20 minutes  face to face with the patient and more than 50% of that time was spent in counseling and/or coordination of care.   __________________________________________   Eppie Gibson, MD

## 2019-01-17 NOTE — Telephone Encounter (Signed)
I left a message regarding schedule  

## 2019-01-17 NOTE — Progress Notes (Signed)
[place] Psychosocial Distress Screening Spiritual Care  Met with [name] and her daughter [name] (sp?), who lives in [place], in [facility] to introduce [facility] team/resources, reviewing distress screen per protocol.  The patient scored a 7 on the Psychosocial Distress Thermometer which indicates severe distress. Also assessed for distress and other psychosocial needs.   ONCBCN DISTRESS SCREENING 01/17/2019  Screening Type Initial Screening  Distress experienced in past week (1-10) 7  Practical problem type Insurance  Emotional problem type Adjusting to illness;Adjusting to appearance changes  Information Concerns Type Lack of info about diagnosis;Lack of info about treatment;Lack of info about complementary therapy choices;Lack of info about maintaining fitness  Referral to support programs Yes   Ms Deboy and her daughter were very impressed with [facility], noting significantly reduced distress now that they have met team and understand plan. Provided emotional support, normalization of feelings, introduction to [facility] team/programming resources.   Follow up needed: No. Placing referral for [facility] [employer] peer mentor per patient request. She plans to contact team as needed, but please also page if needs arise or circumstances change. Thank you.   [place], [place], [facility] Pager [phone] Voicemail [phone]

## 2019-01-18 LAB — GENETIC SCREENING ORDER

## 2019-01-19 ENCOUNTER — Other Ambulatory Visit: Payer: Self-pay

## 2019-01-19 ENCOUNTER — Encounter: Payer: Self-pay | Admitting: Plastic Surgery

## 2019-01-19 ENCOUNTER — Telehealth: Payer: Self-pay | Admitting: *Deleted

## 2019-01-19 ENCOUNTER — Ambulatory Visit (INDEPENDENT_AMBULATORY_CARE_PROVIDER_SITE_OTHER): Payer: PRIVATE HEALTH INSURANCE | Admitting: Plastic Surgery

## 2019-01-19 VITALS — BP 128/87 | HR 59 | Temp 97.5°F | Ht 67.0 in | Wt 145.0 lb

## 2019-01-19 DIAGNOSIS — D0511 Intraductal carcinoma in situ of right breast: Secondary | ICD-10-CM | POA: Diagnosis not present

## 2019-01-19 NOTE — Telephone Encounter (Signed)
Left vm for pt regarding [place] from 01/17/19. Contact information provided for questions or concerns.

## 2019-01-19 NOTE — Progress Notes (Addendum)
Patient ID: [name], female    DOB: December 22, 1955, 63 y.o.   MRN: [id number]   Chief Complaint  Patient presents with  . Breast Cancer    The patient is a 63 years old female here for a consultation for breast reconstruction.  She was recently diagnosed with right breast cancer.  There is 1 area positive and another area of concern.  She is scheduled for an MRI the Friday after Thanksgiving.  She is 5 feet 7 inches tall and weighs 145 pounds.  Her preoperative bra is a 34 A/B.  She would like to be around the same size or thicker if possible.  She has a maternal grandmother that had breast cancer in her 53s.  She saw Dr. Marlou Starks in the breast clinic.  She is thinking about a lumpectomy if possible pending the MRI.  Otherwise she would like to have a right mastectomy.  She has several daughters who will be support to her.  She has had several surgeries in the past including knee surgery, rhinoplasty and tonsillectomy.   Review of Systems  Constitutional: Negative for activity change and appetite change.  HENT: Negative.   Eyes: Negative.   Respiratory: Negative.  Negative for chest tightness and shortness of breath.   Cardiovascular: Negative.   Gastrointestinal: Negative.  Negative for abdominal distention and abdominal pain.  Endocrine: Negative.   Genitourinary: Negative.   Musculoskeletal: Negative.   Hematological: Negative.   Psychiatric/Behavioral: Negative.     Past Medical History:  Diagnosis Date  . Arthritis   . Backache, unspecified   . Colon polyps   . Hyperlipidemia   . Migraine aura without headache     Past Surgical History:  Procedure Laterality Date  . ENDOMETRIAL ABLATION  1998  . KNEE SURGERY Right 2007  . RHINOPLASTY  1977  . TONSILLECTOMY  1961      Current Outpatient Medications:  .  Cholecalciferol (VITAMIN D-3) 125 MCG (5000 UT) TABS, Take by mouth daily., Disp: , Rfl:  .  famotidine (PEPCID) 40 MG tablet, Take 40 mg by mouth daily., Disp: ,  Rfl:  .  Multiple Vitamin (MULTI-VITAMIN DAILY PO), Take by mouth., Disp: , Rfl:  .  omega-3 acid ethyl esters (LOVAZA) 1 G capsule, Take 2 capsules (2 g total) by mouth 2 (two) times daily., Disp: 120 capsule, Rfl: 11 .  omega-3 acid ethyl esters (LOVAZA) 1 g capsule, Take 1 g by mouth 2 (two) times daily., Disp: , Rfl:    Objective:   Vitals:   01/19/19 1258  BP: 128/87  Pulse: (!) 59  Temp: (!) 97.5 F (36.4 C)  SpO2: 100%    Physical Exam Vitals signs and nursing note reviewed.  Constitutional:      Appearance: Normal appearance.  HENT:     Head: Normocephalic and atraumatic.  Eyes:     Extraocular Movements: Extraocular movements intact.  Neck:     Musculoskeletal: Normal range of motion.  Cardiovascular:     Rate and Rhythm: Normal rate.     Pulses: Normal pulses.  Pulmonary:     Effort: Pulmonary effort is normal. No respiratory distress.  Abdominal:     General: Abdomen is flat. There is no distension.  Skin:    General: Skin is warm.     Capillary Refill: Capillary refill takes less than 2 seconds.  Neurological:     General: No focal deficit present.     Mental Status: She is alert and oriented to  person, place, and time.  Psychiatric:        Mood and Affect: Mood normal.        Behavior: Behavior normal.        Thought Content: Thought content normal.     Assessment & Plan:  Ductal carcinoma in situ (DCIS) of right breast We had a detailed conversation about the patient's options for breast reconstruction. Several reconstruction options were explained to the patient.  It is important to remember that breast reconstruction is an optional procedure. Reconstruction often requires several stages of surgery and this means more than one operation.  The surgeries are often done several months apart.  The entire process from start to finish can take a year or more. The major goal of breast reconstruction is to look normal in clothing. There will always be scars and  a difference noticeable without clothes.  This is true for asymmetries where both breasts will not be identical.  Surgery may be needed or desired to the non-cancerous breast in order to achieve better symmetry and satisfactory results.  Regardless of the reconstructive method, there is always risks and the possibility that the procedure will fail or have complications.  This couls required additional surgeries.    We discussed the available methods of breast reconstruction and included:  1. Tissue expander with Acellular dermal matrix followed by implant based reconstruction. This can be done as one surgery or multiple surgeries.  2. Autologous reconstruction can include using a muscle or tissue from another area of the body for the reconstruction.  3. Combined procedures like the latissismus dorsi flaps that often uses the muscle with an expander or implant.  For each of the method discussed the risks, benefits, scars and recovery time were discussed in detail. Specific risks included bleeding, infection, hematoma, seroma, scarring, pain, wound healing complications, flap loss, fat necrosis, capsular contracture, need for implant removal, donor site complications, bulge, hernia, umbilical necrosis, need for urgent reoperation, and need for dressing changes.   After the options were discussed we focused on the patient's desires and the procedure that was best for her based on all the information.  A total of 50 minutes of face-to-face time was spent in this encounter, of which >50% was spent in counseling.    The patient has an MRI scheduled for the day after Thanksgiving.  If anything is revealed making her cancer more extensive she will have a right mastectomy.  Otherwise she will go with a lumpectomy and follow-up with [place] as needed.  If she has the mastectomy she would like to have an expander placed at the same time.  I wait to hear back from her once she has the mammogram and possible biopsy.   Pictures were obtained of the patient and placed in the chart with the patient's or guardian's permission. I have spoken with Dr. Marlou Starks about the above information.  I have also looked at her chart and reviewed the pertinent information and included above.  [place], DO

## 2019-01-26 ENCOUNTER — Other Ambulatory Visit: Payer: Self-pay

## 2019-01-26 ENCOUNTER — Ambulatory Visit (HOSPITAL_COMMUNITY)
Admission: RE | Admit: 2019-01-26 | Discharge: 2019-01-26 | Disposition: A | Discharge status: Home / Self Care | Payer: PRIVATE HEALTH INSURANCE | Source: Ambulatory Visit | Attending: General Surgery | Admitting: General Surgery

## 2019-01-26 DIAGNOSIS — D0511 Intraductal carcinoma in situ of right breast: Secondary | ICD-10-CM | POA: Diagnosis not present

## 2019-01-26 MED ORDER — GADOBUTROL 1 MMOL/ML IV SOLN
7.0000 mL | Freq: Once | INTRAVENOUS | Status: AC | PRN
  Administered 2019-01-26: 7 mL via INTRAVENOUS

## 2019-01-29 ENCOUNTER — Other Ambulatory Visit: Payer: Self-pay | Admitting: General Surgery

## 2019-01-29 ENCOUNTER — Other Ambulatory Visit: Payer: Self-pay | Admitting: Oncology

## 2019-01-29 DIAGNOSIS — R9389 Abnormal findings on diagnostic imaging of other specified body structures: Secondary | ICD-10-CM

## 2019-02-01 ENCOUNTER — Telehealth: Payer: Self-pay | Admitting: *Deleted

## 2019-02-01 NOTE — Telephone Encounter (Signed)
Spoke to pt concerning MRI bx and post mammo appt for 02/02/19. Denies further needs or questions at this time.

## 2019-02-02 ENCOUNTER — Ambulatory Visit
Admission: RE | Admit: 2019-02-02 | Discharge: 2019-02-02 | Disposition: A | Discharge status: Home / Self Care | Payer: PRIVATE HEALTH INSURANCE | Source: Ambulatory Visit | Attending: General Surgery | Admitting: General Surgery

## 2019-02-02 ENCOUNTER — Other Ambulatory Visit: Payer: Self-pay | Admitting: General Surgery

## 2019-02-02 ENCOUNTER — Ambulatory Visit: Payer: PRIVATE HEALTH INSURANCE

## 2019-02-02 ENCOUNTER — Telehealth: Payer: PRIVATE HEALTH INSURANCE | Admitting: Plastic Surgery

## 2019-02-02 ENCOUNTER — Encounter: Payer: Self-pay | Admitting: Plastic Surgery

## 2019-02-02 ENCOUNTER — Telehealth (INDEPENDENT_AMBULATORY_CARE_PROVIDER_SITE_OTHER): Payer: PRIVATE HEALTH INSURANCE | Admitting: Plastic Surgery

## 2019-02-02 ENCOUNTER — Other Ambulatory Visit: Payer: Self-pay

## 2019-02-02 DIAGNOSIS — D0511 Intraductal carcinoma in situ of right breast: Secondary | ICD-10-CM | POA: Diagnosis not present

## 2019-02-02 DIAGNOSIS — R9389 Abnormal findings on diagnostic imaging of other specified body structures: Secondary | ICD-10-CM

## 2019-02-02 MED ORDER — GADOBUTROL 1 MMOL/ML IV SOLN
7.0000 mL | Freq: Once | INTRAVENOUS | Status: AC | PRN
  Administered 2019-02-02: 7 mL via INTRAVENOUS

## 2019-02-06 ENCOUNTER — Encounter: Payer: Self-pay | Admitting: Plastic Surgery

## 2019-02-06 NOTE — Progress Notes (Signed)
The patient is a 63 year old female joining me by a telemetry visit for further discussion on breast reconstruction.  She was diagnosed with right breast cancer with concerns of a couple other areas.  She was getting the MRI the day after Thanksgiving when she had nausea and vomiting.  The MRI was stopped.  She will now have to repeat this.  Her decision for treatment is still based on the results of the MRI.  She will let us know what her decision is after she knows if the other areas of concern are cancerous or not.  She will give [place] a call if the office is that she gets those results.  We remain available for any questions or further discussion.  The patient gave consent to have this visit done by telemedicine / virtual visit.  This is also consent for access the chart and treat the patient via this visit. The patient is located at home.  I, the provider, am at the office.  We spent 10 minutes together for the visit.

## 2019-02-08 ENCOUNTER — Telehealth: Payer: Self-pay | Admitting: *Deleted

## 2019-02-08 NOTE — Telephone Encounter (Signed)
Received call from pt stating she is covid +. She is currently scheduled for MR bx on 12/15. Informed pt that the bx will need to be moved to 2 wks after the + covid test. GI informed- will call pt with new appt. Dr. Marlou Starks informed.

## 2019-02-12 ENCOUNTER — Encounter: Payer: Self-pay | Admitting: *Deleted

## 2019-02-13 ENCOUNTER — Other Ambulatory Visit: Payer: PRIVATE HEALTH INSURANCE

## 2019-02-16 ENCOUNTER — Other Ambulatory Visit: Payer: PRIVATE HEALTH INSURANCE

## 2019-02-21 ENCOUNTER — Ambulatory Visit
Admission: RE | Admit: 2019-02-21 | Discharge: 2019-02-21 | Disposition: A | Discharge status: Home / Self Care | Payer: PRIVATE HEALTH INSURANCE | Source: Ambulatory Visit | Attending: General Surgery | Admitting: General Surgery

## 2019-02-21 ENCOUNTER — Ambulatory Visit
Admission: RE | Admit: 2019-02-21 | Discharge: 2019-02-21 | Disposition: A | Discharge status: Home / Self Care | Payer: PRIVATE HEALTH INSURANCE | Source: Ambulatory Visit

## 2019-02-21 ENCOUNTER — Other Ambulatory Visit (HOSPITAL_COMMUNITY): Payer: Self-pay | Admitting: Diagnostic Radiology

## 2019-02-21 ENCOUNTER — Ambulatory Visit: Admission: RE | Admit: 2019-02-21 | Payer: PRIVATE HEALTH INSURANCE | Source: Ambulatory Visit

## 2019-02-21 ENCOUNTER — Other Ambulatory Visit: Payer: Self-pay

## 2019-02-21 ENCOUNTER — Other Ambulatory Visit: Payer: Self-pay | Admitting: General Surgery

## 2019-02-21 DIAGNOSIS — R9389 Abnormal findings on diagnostic imaging of other specified body structures: Secondary | ICD-10-CM

## 2019-02-21 MED ORDER — GADOBUTROL 1 MMOL/ML IV SOLN
7.0000 mL | Freq: Once | INTRAVENOUS | Status: AC | PRN
  Administered 2019-02-21: 7 mL via INTRAVENOUS

## 2019-02-27 ENCOUNTER — Ambulatory Visit: Payer: Self-pay | Admitting: General Surgery

## 2019-02-27 DIAGNOSIS — D0511 Intraductal carcinoma in situ of right breast: Secondary | ICD-10-CM

## 2019-03-06 ENCOUNTER — Encounter: Payer: Self-pay | Admitting: *Deleted

## 2019-03-08 ENCOUNTER — Encounter: Payer: Self-pay | Admitting: *Deleted

## 2019-03-09 ENCOUNTER — Other Ambulatory Visit: Payer: Self-pay

## 2019-03-09 ENCOUNTER — Ambulatory Visit: Payer: PRIVATE HEALTH INSURANCE | Admitting: Plastic Surgery

## 2019-03-09 ENCOUNTER — Ambulatory Visit (INDEPENDENT_AMBULATORY_CARE_PROVIDER_SITE_OTHER): Payer: PRIVATE HEALTH INSURANCE | Admitting: Plastic Surgery

## 2019-03-09 ENCOUNTER — Telehealth: Payer: Self-pay | Admitting: *Deleted

## 2019-03-09 ENCOUNTER — Encounter: Payer: Self-pay | Admitting: Plastic Surgery

## 2019-03-09 VITALS — BP 122/85 | HR 56 | Temp 98.0°F | Ht 67.0 in | Wt 149.6 lb

## 2019-03-09 DIAGNOSIS — D0511 Intraductal carcinoma in situ of right breast: Secondary | ICD-10-CM | POA: Diagnosis not present

## 2019-03-09 NOTE — Telephone Encounter (Signed)
Pt called to discuss final sx plan. Would like to speak to Dr. Marlou Starks regarding L NSM and Right Mastectomy with nipple tattooing.  Msg sent to Dr. Marlou Starks and his nurse.

## 2019-03-09 NOTE — Progress Notes (Addendum)
Subjective:    Patient ID: [name], female    DOB: 24-Dec-1955, 64 y.o.   MRN: [id number]  The patient is a 64 year old female here for further discussion on her breast reconstruction.  She was able to get the MRI and has decided on bilateral mastectomies.  Her diagnosis is still right sided ductal carcinoma in situ.  She is 5 feet 7 inches tall weighs 145 pounds.  Her preoperative bra is a 34 A/B.  She would like to be around the same size.  She does have a family history of breast cancer.  She has good family support with her husband and her daughters.  She has had surgery in the past including knee, rhinoplasty and tonsillectomy.  She had Covid exposure and is doing well since then.     Review of Systems  Constitutional: Negative.   HENT: Negative.   Eyes: Negative.   Respiratory: Negative.  Negative for chest tightness.   Cardiovascular: Negative.  Negative for leg swelling.  Gastrointestinal: Negative.  Negative for abdominal pain.  Endocrine: Negative.   Genitourinary: Negative.   Musculoskeletal: Negative.  Negative for back pain.  Hematological: Negative.   Psychiatric/Behavioral: Negative.        Objective:   Physical Exam Vitals and nursing note reviewed.  Constitutional:      Appearance: Normal appearance.  HENT:     Head: Normocephalic and atraumatic.  Cardiovascular:     Rate and Rhythm: Normal rate.     Pulses: Normal pulses.  Pulmonary:     Effort: Pulmonary effort is normal.  Musculoskeletal:     Cervical back: Normal range of motion.  Skin:    General: Skin is warm.  Neurological:     General: No focal deficit present.     Mental Status: She is alert and oriented to person, place, and time.  Psychiatric:        Mood and Affect: Mood normal.        Behavior: Behavior normal.        Assessment & Plan:     ICD-10-CM   1. Ductal carcinoma in situ (DCIS) of right breast  D05.11     We discussed the risks of surgery again and the  limitations.  She does have the option of salvaging her left nipple areola.  She is not sure what she wants to do yet.  She is going to think about it and let us know before surgery.  She is aware that the right nipple areola will not be salvaged.  So she will have bilateral mastectomies with right nipple areola resected.  She is in agreement for expander placement with Flex HD.  She understands she will have the drains for 2 weeks.  The reconstruction will be approximately 3 months after the first surgery.  The nipple areola tattooing will happen after the second surgery.  It is advised that she take about 2 weeks off from work if possible.  So the plan will be bilateral immediate breast reconstruction with skin sparing on the right and possible nipple sparing on the left.  The [place] was signed into law in 2016 which includes the topic of electronic health records.  This provides immediate access to information in MyChart.  This includes consultation notes, operative notes, office notes, lab results and pathology reports.  If you have any questions about what you read please let us know at your next visit or call us at the office.  We  are right here with you.   03/20/2019 Patient has decided on bilateral standard mastectomies.

## 2019-03-15 ENCOUNTER — Encounter: Payer: Self-pay | Admitting: *Deleted

## 2019-03-29 ENCOUNTER — Encounter (HOSPITAL_BASED_OUTPATIENT_CLINIC_OR_DEPARTMENT_OTHER): Payer: Self-pay | Admitting: General Surgery

## 2019-03-29 ENCOUNTER — Other Ambulatory Visit: Payer: Self-pay

## 2019-03-29 ENCOUNTER — Encounter: Payer: PRIVATE HEALTH INSURANCE | Admitting: Surgical

## 2019-04-02 ENCOUNTER — Other Ambulatory Visit (HOSPITAL_COMMUNITY): Payer: PRIVATE HEALTH INSURANCE

## 2019-04-02 ENCOUNTER — Other Ambulatory Visit: Payer: Self-pay

## 2019-04-02 ENCOUNTER — Encounter: Payer: Self-pay | Admitting: Surgical

## 2019-04-02 ENCOUNTER — Ambulatory Visit (INDEPENDENT_AMBULATORY_CARE_PROVIDER_SITE_OTHER): Payer: BC Managed Care – PPO | Admitting: Surgical

## 2019-04-02 VITALS — BP 127/87 | HR 69 | Temp 98.6°F | Ht 67.0 in | Wt 145.0 lb

## 2019-04-02 DIAGNOSIS — D0511 Intraductal carcinoma in situ of right breast: Secondary | ICD-10-CM

## 2019-04-02 MED ORDER — DIAZEPAM 2 MG PO TABS: 2.0000 mg | ORAL_TABLET | Freq: Two times a day (BID) | ORAL | 0 refills | Status: DC | PRN

## 2019-04-02 MED ORDER — CEPHALEXIN 500 MG PO CAPS: 500.0000 mg | ORAL_CAPSULE | Freq: Four times a day (QID) | ORAL | 0 refills | Status: AC

## 2019-04-02 MED ORDER — HYDROCODONE-ACETAMINOPHEN 5-325 MG PO TABS: 1.0000 | ORAL_TABLET | Freq: Four times a day (QID) | ORAL | 0 refills | Status: AC | PRN

## 2019-04-02 MED ORDER — ONDANSETRON HCL 4 MG PO TABS: 4.0000 mg | ORAL_TABLET | Freq: Three times a day (TID) | ORAL | 0 refills | Status: DC | PRN

## 2019-04-02 NOTE — Progress Notes (Signed)

## 2019-04-02 NOTE — Progress Notes (Signed)
Patient ID: [name], female    DOB: 1955-06-26, 64 y.o.   MRN: [id number]  Chief Complaint  Patient presents with   Pre-op Exam    Pre op for bilateral reconstruction with immediate expander       ICD-10-CM   1. Ductal carcinoma in situ (DCIS) of right breast  D05.11    History of Present Illness: [name] is a 64 y.o.  female  With right sided ductal carcinoma in situ. She has decided to move forward with bilateral mastectomies, non-nipple sparing. She presents for preoperative evaluation for upcoming procedure, bilateral breast reconstruction with placement of tissue expanders and flex HD ADM, scheduled for 04/05/19 with Dr. Marla Roe after bilateral mastectomies and right sentinel lymph node bx by Dr. Marlou Starks .  She reports a hx of PONV, but reports it did not occur with her more recent surgery in 2007.  She does not smoke. No HRT. At this time, no plan for chemo/radiation, pending lymph node biopsy.  She reports her mother had a DVT when she was taking OCP, but reports no personal hx or any other famhx.  No recent colds. No hx of MI, CVA, lung disorders. She is generally healthy.   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 She will have help at home from family and friends.  Past Medical History: Allergies: No Known Allergies  Current Medications:  Current Outpatient Medications:    Cholecalciferol (VITAMIN D-3) 125 MCG (5000 UT) TABS, Take by mouth daily., Disp: , Rfl:    famotidine (PEPCID) 40 MG tablet, Take 40 mg by mouth daily., Disp: , Rfl:    Multiple Vitamin (MULTI-VITAMIN DAILY PO), Take by mouth., Disp: , Rfl:    omega-3 acid ethyl esters (LOVAZA) 1 g capsule, Take 1 g by mouth 2 (two) times daily., Disp: , Rfl:   Past Medical Problems: Past Medical History:  Diagnosis Date   Arthritis    Backache, unspecified    Colon polyps    Hyperlipidemia    PONV (postoperative nausea and  vomiting)     Past Surgical History: Past Surgical History:  Procedure Laterality Date   ENDOMETRIAL ABLATION  1998   KNEE SURGERY Right 2007   [place]    Social History: Social History   Socioeconomic History   Marital status: Married    Spouse name: Psychologist, prison and probation services   Number of children: 3   Years of education: 16+   Highest education level: Not on file  Occupational History    Employer: [employer]  Tobacco Use   Smoking status: Never Smoker   Smokeless tobacco: Never Used  Substance and Sexual Activity   Alcohol use: Yes    Comment: 2-3/week   Drug use: No   Sexual activity: Yes    Partners: Male  Other Topics Concern   Not on file  Social History Narrative   Marital Status:  Married Psychologist, counselling)    Children:  G3 P3 Daughters ([name], [name], [place])    Pets:  Poodle ([name]); Cats [name], Shirlee Limerick)    Living Situation: Lives with spouse.  Her daughters live in [place].    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[employer])    Education:  Facilities manager)    Tobacco Use/Exposure:  None    Alcohol Use:  Occasional   Drug Use:  None   Diet:  Regular   Exercise:  Walking   Hobbies:  Gardening, Actor              Social Determinants of Health   Financial Resource Strain:    Difficulty of Paying Living Expenses: Not on file  Food Insecurity:    Worried About Charity fundraiser in the Last Year: Not on file   [employer] of Food in the Last Year: Not on file  Transportation Waters:    Lack of Transportation (Medical): Not on file   Lack of Transportation (Non-Medical): Not on file  Physical Activity:    Days of Exercise per Week: Not on file   Minutes of Exercise per Session: Not on file  Stress:    Feeling of Stress : Not on file  Social Connections:    Frequency of Communication with Friends and Family: Not on file   Frequency of  Social Gatherings with Friends and Family: Not on file   Attends Religious Services: Not on file   Active Member of Clubs or Organizations: Not on file   Attends Archivist Meetings: Not on file   Marital Status: Not on file  Intimate Partner Violence:    Fear of Current or Ex-Partner: Not on file   Emotionally Abused: Not on file   Physically Abused: Not on file   Sexually Abused: Not on file    Family History: Family History  Problem Relation Age of Onset   Arthritis Mother    Cancer Mother        Colon    Melanoma Father    Cancer Maternal Grandmother        Breast    Colon cancer Maternal Grandmother    Cancer Maternal Grandfather        Lung (Smoker)    Hyperlipidemia Paternal Grandmother    Cancer Paternal Grandfather        Lung (Smoker)     Review of Systems: Review of Systems  Constitutional: Negative.   Respiratory: Negative.   Cardiovascular: Negative.   Gastrointestinal: Negative.   Musculoskeletal: Negative.   Neurological: Negative.    Physical Exam: Vital Signs BP 127/87 (BP Location: Left Arm, Patient Position: Sitting, Cuff Size: Normal)    Pulse 69    Temp 98.6 F (37 C) (Temporal)    Ht 5\' 7"  (1.702 m)    Wt 145 lb (65.8 kg)    SpO2 100%    BMI 22.71 kg/m  Physical Exam Exam conducted with a chaperone present.  Constitutional:      General: She is not in acute distress.    Appearance: Normal appearance. She is not ill-appearing.  HENT:     Head: Normocephalic and atraumatic.  Eyes:     Pupils: Pupils are equal, round Neck:     Musculoskeletal: Normal range of motion.  Cardiovascular:     Rate and Rhythm: Normal rate and regular rhythm.     Pulses: Normal pulses.     Heart sounds: Normal heart sounds. No murmur.  Pulmonary:     Effort: Pulmonary effort is normal. No respiratory distress.     Breath sounds: Normal breath sounds. No wheezing.  Abdominal:     General: Abdomen is flat. There is no distension.      Palpations: Abdomen is soft.     Tenderness: There is no abdominal tenderness.  Musculoskeletal: Normal range of motion.  Skin:    General: Skin is warm and dry.  Findings: No erythema or rash.  Neurological:     General: No focal deficit present.     Mental Status: She is alert and oriented to person, place, and time. Mental status is at baseline.     Motor: No weakness.  Psychiatric:        Mood and Affect: Mood normal.        Behavior: Behavior normal.    Assessment/Plan: The patient is scheduled for bilateral breast reconstruction with placement of tissue expanders and flex hd (ADM) with Dr. Marla Roe on 04/05/19.   Caprini score of 6, recommend mechanical ppx, early ambulation, possible chemoppx with LDH or LMWH.  Rx sent to pharmacy. All of patients questions were answered thoroughly, recommended calling if she has any further questions or concerns.  The risks that can be encountered with and after placement of a breast expander placement were discussed and include the following but not limited to these: bleeding, infection, delayed healing, anesthesia risks, skin sensation changes, injury to structures including nerves, blood vessels, and muscles which may be temporary or permanent, allergies to tape, suture materials and glues, blood products, topical preparations or injected agents, skin contour irregularities, skin discoloration and swelling, deep vein thrombosis, cardiac and pulmonary complications, pain, which may persist, fluid accumulation, wrinkling of the skin over the expander, changes in nipple or breast sensation, expander leakage or rupture, faulty position of the expander, persistent pain, formation of tight scar tissue around the expander (capsular contracture), possible need for revisional surgery or staged procedures.   Electronically signed by: Carola Rhine Maleiyah Releford, PA-C 04/02/2019 9:22 AM

## 2019-04-02 NOTE — H&P (View-Only) (Signed)
Patient ID: [name], female    DOB: [birth year], 64 y.o.   MRN: [id number]  Chief Complaint  Patient presents with   Pre-op Exam    Pre op for bilateral reconstruction with immediate expander       ICD-10-CM   1. Ductal carcinoma in situ (DCIS) of right breast  D05.11    History of Present Illness: [name] is a 64 y.o.  female  With right sided ductal carcinoma in situ. She has decided to move forward with bilateral mastectomies, non-nipple sparing. She presents for preoperative evaluation for upcoming procedure, bilateral breast reconstruction with placement of tissue expanders and flex HD ADM, scheduled for 04/05/19 with Dr. Marla Roe after bilateral mastectomies and right sentinel lymph node bx by Dr. Marlou Starks .  She reports a hx of PONV, but reports it did not occur with her more recent surgery in 2007.  She does not smoke. No HRT. At this time, no plan for chemo/radiation, pending lymph node biopsy.  She reports her mother had a DVT when she was taking OCP, but reports no personal hx or any other famhx.  No recent colds. No hx of MI, CVA, lung disorders. She is generally healthy.   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 She will have help at home from family and friends.  Past Medical History: Allergies: No Known Allergies  Current Medications:  Current Outpatient Medications:    Cholecalciferol (VITAMIN D-3) 125 MCG (5000 UT) TABS, Take by mouth daily., Disp: , Rfl:    famotidine (PEPCID) 40 MG tablet, Take 40 mg by mouth daily., Disp: , Rfl:    Multiple Vitamin (MULTI-VITAMIN DAILY PO), Take by mouth., Disp: , Rfl:    omega-3 acid ethyl esters (LOVAZA) 1 g capsule, Take 1 g by mouth 2 (two) times daily., Disp: , Rfl:   Past Medical Problems: Past Medical History:  Diagnosis Date   Arthritis    Backache, unspecified    Colon polyps    Hyperlipidemia    PONV (postoperative nausea and  vomiting)     Past Surgical History: Past Surgical History:  Procedure Laterality Date   ENDOMETRIAL ABLATION  1998   KNEE SURGERY Right 2007   [place]    Social History: Social History   Socioeconomic History   Marital status: Married    Spouse name: Psychologist, prison and probation services   Number of children: 3   Years of education: 16+   Highest education level: Not on file  Occupational History    Employer: [employer]  Tobacco Use   Smoking status: Never Smoker   Smokeless tobacco: Never Used  Substance and Sexual Activity   Alcohol use: Yes    Comment: 2-3/week   Drug use: No   Sexual activity: Yes    Partners: Male  Other Topics Concern   Not on file  Social History Narrative   Marital Status:  Married Psychologist, counselling)    Children:  G3 P3 Daughters (Lauren, Sherran Needs, [place])    Pets:  Poodle (Mollie); Cats Eduard Clos, Shirlee L[place])    Living Situation: Lives with spouse.  Her daughters live in [place].    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[employer])    Education:  Facilities manager)    Tobacco Use/Exposure:  None    Alcohol Use:  Occasional   Drug Use:  None   Diet:  Regular   Exercise:  Walking   Hobbies:  Gardening, Actor              Social Determinants of Health   Financial Resource Strain:    Difficulty of Paying Living Expenses: Not on file  Food Insecurity:    Worried About Charity fundraiser in the Last Year: Not on file   [employer] of Food in the Last Year: Not on file  Transportation Needs:    Lack of Transportation (Medical): Not on file   Lack of Transportation (Non-Medical): Not on file  Physical Activity:    Days of Exercise per Week: Not on file   Minutes of Exercise per Session: Not on file  Stress:    Feeling of Stress : Not on file  Social Connections:    Frequency of Communication with Friends and Family: Not on file   Frequency of  Social Gatherings with Friends and Family: Not on file   Attends Religious Services: Not on file   Active Member of Clubs or Organizations: Not on file   Attends Archivist Meetings: Not on file   Marital Status: Not on file  Intimate Partner Violence:    Fear of Current or Ex-Partner: Not on file   Emotionally Abused: Not on file   Physically Abused: Not on file   Sexually Abused: Not on file    Family History: Family History  Problem Relation Age of Onset   Arthritis Mother    Cancer Mother        Colon    Melanoma Father    Cancer Maternal Grandmother        Breast    Colon cancer Maternal Grandmother    Cancer Maternal Grandfather        Lung (Smoker)    Hyperlipidemia Paternal Grandmother    Cancer Paternal Grandfather        Lung (Smoker)     Review of Systems: Review of Systems  Constitutional: Negative.   Respiratory: Negative.   Cardiovascular: Negative.   Gastrointestinal: Negative.   Musculoskeletal: Negative.   Neurological: Negative.    Physical Exam: Vital Signs BP 127/87 (BP Location: Left Arm, Patient Position: Sitting, Cuff Size: Normal)    Pulse 69    Temp 98.6 F (37 C) (Temporal)    Ht 5\' 7"  (1.702 m)    Wt 145 lb (65.8 kg)    SpO2 100%    BMI 22.71 kg/m  Physical Exam Exam conducted with a chaperone present.  Constitutional:      General: She is not in acute distress.    Appearance: Normal appearance. She is not ill-appearing.  HENT:     Head: Normocephalic and atraumatic.  Eyes:     Pupils: Pupils are equal, round Neck:     Musculoskeletal: Normal range of motion.  Cardiovascular:     Rate and Rhythm: Normal rate and regular rhythm.     Pulses: Normal pulses.     Heart sounds: Normal heart sounds. No murmur.  Pulmonary:     Effort: Pulmonary effort is normal. No respiratory distress.     Breath sounds: Normal breath sounds. No wheezing.  Abdominal:     General: Abdomen is flat. There is no distension.      Palpations: Abdomen is soft.     Tenderness: There is no abdominal tenderness.  Musculoskeletal: Normal range of motion.  Skin:    General: Skin is warm and dry.  Findings: No erythema or rash.  Neurological:     General: No focal deficit present.     Mental Status: She is alert and oriented to person, place, and time. Mental status is at baseline.     Motor: No weakness.  Psychiatric:        Mood and Affect: Mood normal.        Behavior: Behavior normal.    Assessment/Plan: The patient is scheduled for bilateral breast reconstruction with placement of tissue expanders and flex hd (ADM) with Dr. Marla Roe on 04/05/19.   Caprini score of 6, recommend mechanical ppx, early ambulation, possible chemoppx with LDH or LMWH.  Rx sent to pharmacy. All of patients questions were answered thoroughly, recommended calling if she has any further questions or concerns.  The risks that can be encountered with and after placement of a breast expander placement were discussed and include the following but not limited to these: bleeding, infection, delayed healing, anesthesia risks, skin sensation changes, injury to structures including nerves, blood vessels, and muscles which may be temporary or permanent, allergies to tape, suture materials and glues, blood products, topical preparations or injected agents, skin contour irregularities, skin discoloration and swelling, deep vein thrombosis, cardiac and pulmonary complications, pain, which may persist, fluid accumulation, wrinkling of the skin over the expander, changes in nipple or breast sensation, expander leakage or rupture, faulty position of the expander, persistent pain, formation of tight scar tissue around the expander (capsular contracture), possible need for revisional surgery or staged procedures.   Electronically signed by: Carola Rhine Aldena Worm, PA-C 04/02/2019 9:22 AM

## 2019-04-04 NOTE — Anesthesia Preprocedure Evaluation (Addendum)
Anesthesia Evaluation  Patient identified by MRN, date of birth, ID band Patient awake    Reviewed: Allergy & Precautions, H&P , NPO status , Patient's Chart, lab work & pertinent test results  History of Anesthesia Complications (+) PONV and history of anesthetic complications  Airway Mallampati: I  TM Distance: >3 FB Neck ROM: Full    Dental no notable dental hx. (+) Teeth Intact, Caps, Dental Advisory Given   Pulmonary neg pulmonary ROS,    Pulmonary exam normal breath sounds clear to auscultation       Cardiovascular Exercise Tolerance: Good negative cardio ROS Normal cardiovascular exam Rhythm:Regular Rate:Normal     Neuro/Psych negative neurological ROS  negative psych ROS   GI/Hepatic negative GI ROS, Neg liver ROS,   Endo/Other  negative endocrine ROS  Renal/GU negative Renal ROS  negative genitourinary   Musculoskeletal negative musculoskeletal ROS (+) Arthritis , Osteoarthritis,    Abdominal   Peds negative pediatric ROS (+)  Hematology negative hematology ROS (+)   Anesthesia Other Findings   Reproductive/Obstetrics negative OB ROS                            Anesthesia Physical Anesthesia Plan  ASA: II  Anesthesia Plan: General   Post-op Pain Management:    Induction: Intravenous  PONV Risk Score and Plan: 3 and Ondansetron, Dexamethasone, Treatment may vary due to age or medical condition, Midazolam and Propofol infusion  Airway Management Planned: Oral ETT and LMA  Additional Equipment:   Intra-op Plan:   Post-operative Plan: Extubation in OR  Informed Consent: I have reviewed the patients History and Physical, chart, labs and discussed the procedure including the risks, benefits and alternatives for the proposed anesthesia with the patient or authorized representative who has indicated his/her understanding and acceptance.       Plan Discussed with:  Anesthesiologist  Anesthesia Plan Comments: (  )        Anesthesia Quick Evaluation

## 2019-04-05 ENCOUNTER — Encounter (HOSPITAL_COMMUNITY)
Admission: RE | Admit: 2019-04-05 | Discharge: 2019-04-05 | Disposition: A | Discharge status: Home / Self Care | Payer: BC Managed Care – PPO | Source: Ambulatory Visit | Attending: General Surgery | Admitting: General Surgery

## 2019-04-05 ENCOUNTER — Encounter (HOSPITAL_BASED_OUTPATIENT_CLINIC_OR_DEPARTMENT_OTHER): Payer: Self-pay | Admitting: General Surgery

## 2019-04-05 ENCOUNTER — Ambulatory Visit (HOSPITAL_BASED_OUTPATIENT_CLINIC_OR_DEPARTMENT_OTHER): Payer: BC Managed Care – PPO | Admitting: Anesthesiology

## 2019-04-05 ENCOUNTER — Other Ambulatory Visit: Payer: Self-pay

## 2019-04-05 ENCOUNTER — Encounter (HOSPITAL_BASED_OUTPATIENT_CLINIC_OR_DEPARTMENT_OTHER)
Admission: RE | Disposition: A | Discharge status: Home / Self Care | Payer: Self-pay | Source: Home / Self Care | Attending: Plastic Surgery

## 2019-04-05 ENCOUNTER — Observation Stay (HOSPITAL_BASED_OUTPATIENT_CLINIC_OR_DEPARTMENT_OTHER)
Admission: RE | Admit: 2019-04-05 | Discharge: 2019-04-06 | Disposition: A | Discharge status: Home / Self Care | Payer: BC Managed Care – PPO | Attending: Plastic Surgery | Admitting: Plastic Surgery

## 2019-04-05 DIAGNOSIS — N6012 Diffuse cystic mastopathy of left breast: Secondary | ICD-10-CM | POA: Diagnosis not present

## 2019-04-05 DIAGNOSIS — E785 Hyperlipidemia, unspecified: Secondary | ICD-10-CM | POA: Insufficient documentation

## 2019-04-05 DIAGNOSIS — C50919 Malignant neoplasm of unspecified site of unspecified female breast: Secondary | ICD-10-CM | POA: Diagnosis present

## 2019-04-05 DIAGNOSIS — Z79899 Other long term (current) drug therapy: Secondary | ICD-10-CM | POA: Diagnosis not present

## 2019-04-05 DIAGNOSIS — C50911 Malignant neoplasm of unspecified site of right female breast: Secondary | ICD-10-CM | POA: Diagnosis present

## 2019-04-05 DIAGNOSIS — M1990 Unspecified osteoarthritis, unspecified site: Secondary | ICD-10-CM | POA: Insufficient documentation

## 2019-04-05 DIAGNOSIS — D0511 Intraductal carcinoma in situ of right breast: Secondary | ICD-10-CM

## 2019-04-05 DIAGNOSIS — K219 Gastro-esophageal reflux disease without esophagitis: Secondary | ICD-10-CM | POA: Diagnosis not present

## 2019-04-05 HISTORY — PX: MASTECTOMY W/ SENTINEL NODE BIOPSY: SHX2001

## 2019-04-05 HISTORY — DX: Nausea with vomiting, unspecified: R11.2

## 2019-04-05 HISTORY — PX: BREAST RECONSTRUCTION WITH PLACEMENT OF TISSUE EXPANDER AND FLEX HD (ACELLULAR HYDRATED DERMIS): SHX6295

## 2019-04-05 HISTORY — DX: Other specified postprocedural states: Z98.890

## 2019-04-05 SURGERY — MASTECTOMY WITH SENTINEL LYMPH NODE BIOPSY
Anesthesia: General | Site: Breast | Laterality: Bilateral

## 2019-04-05 MED ORDER — SENNA 8.6 MG PO TABS
1.0000 | ORAL_TABLET | Freq: Two times a day (BID) | ORAL | Status: DC
  Administered 2019-04-05: 8.6 mg via ORAL
  Filled 2019-04-05: qty 1

## 2019-04-05 MED ORDER — CHLORHEXIDINE GLUCONATE CLOTH 2 % EX PADS: 6.0000 | MEDICATED_PAD | Freq: Once | CUTANEOUS | Status: DC

## 2019-04-05 MED ORDER — FENTANYL CITRATE (PF) 100 MCG/2ML IJ SOLN
INTRAMUSCULAR | Status: AC
  Filled 2019-04-05: qty 2

## 2019-04-05 MED ORDER — FENTANYL CITRATE (PF) 100 MCG/2ML IJ SOLN
50.0000 ug | INTRAMUSCULAR | Status: DC | PRN
  Administered 2019-04-05 (×2): 100 ug via INTRAVENOUS

## 2019-04-05 MED ORDER — DIPHENHYDRAMINE HCL 12.5 MG/5ML PO ELIX: 12.5000 mg | ORAL_SOLUTION | Freq: Four times a day (QID) | ORAL | Status: DC | PRN

## 2019-04-05 MED ORDER — POLYETHYLENE GLYCOL 3350 17 G PO PACK: 17.0000 g | PACK | Freq: Every day | ORAL | Status: DC | PRN

## 2019-04-05 MED ORDER — PHENYLEPHRINE 40 MCG/ML (10ML) SYRINGE FOR IV PUSH (FOR BLOOD PRESSURE SUPPORT)
PREFILLED_SYRINGE | INTRAVENOUS | Status: AC
  Filled 2019-04-05: qty 10

## 2019-04-05 MED ORDER — SUCCINYLCHOLINE CHLORIDE 200 MG/10ML IV SOSY
PREFILLED_SYRINGE | INTRAVENOUS | Status: AC
  Filled 2019-04-05: qty 10

## 2019-04-05 MED ORDER — IBUPROFEN 600 MG PO TABS
600.0000 mg | ORAL_TABLET | Freq: Four times a day (QID) | ORAL | Status: DC
  Administered 2019-04-05 – 2019-04-06 (×3): 600 mg via ORAL
  Filled 2019-04-05 (×3): qty 1

## 2019-04-05 MED ORDER — HYDROMORPHONE HCL 1 MG/ML IJ SOLN
INTRAMUSCULAR | Status: AC
  Filled 2019-04-05: qty 0.5

## 2019-04-05 MED ORDER — SUGAMMADEX SODIUM 200 MG/2ML IV SOLN
INTRAVENOUS | Status: DC | PRN
  Administered 2019-04-05: 200 mg via INTRAVENOUS

## 2019-04-05 MED ORDER — EPHEDRINE SULFATE 50 MG/ML IJ SOLN
INTRAMUSCULAR | Status: DC | PRN
  Administered 2019-04-05 (×4): 10 mg via INTRAVENOUS

## 2019-04-05 MED ORDER — CEFAZOLIN SODIUM-DEXTROSE 2-4 GM/100ML-% IV SOLN
INTRAVENOUS | Status: AC
  Filled 2019-04-05: qty 100

## 2019-04-05 MED ORDER — PHENYLEPHRINE HCL (PRESSORS) 10 MG/ML IV SOLN
INTRAVENOUS | Status: DC | PRN
  Administered 2019-04-05: 40 ug via INTRAVENOUS

## 2019-04-05 MED ORDER — ACETAMINOPHEN 500 MG PO TABS
ORAL_TABLET | ORAL | Status: AC
  Filled 2019-04-05: qty 2

## 2019-04-05 MED ORDER — ACETAMINOPHEN 500 MG PO TABS
1000.0000 mg | ORAL_TABLET | ORAL | Status: AC
  Administered 2019-04-05: 1000 mg via ORAL

## 2019-04-05 MED ORDER — MEPERIDINE HCL 25 MG/ML IJ SOLN: 6.2500 mg | INTRAMUSCULAR | Status: DC | PRN

## 2019-04-05 MED ORDER — SODIUM CHLORIDE 0.9 % IV SOLN
INTRAVENOUS | Status: DC | PRN
  Administered 2019-04-05: 11:00:00 500 mL

## 2019-04-05 MED ORDER — OXYCODONE HCL 5 MG/5ML PO SOLN: 5.0000 mg | Freq: Once | ORAL | Status: DC | PRN

## 2019-04-05 MED ORDER — MIDAZOLAM HCL 2 MG/2ML IJ SOLN
INTRAMUSCULAR | Status: AC
  Filled 2019-04-05: qty 2

## 2019-04-05 MED ORDER — BUPIVACAINE-EPINEPHRINE (PF) 0.25% -1:200000 IJ SOLN
INTRAMUSCULAR | Status: DC | PRN
  Administered 2019-04-05: 30 mL

## 2019-04-05 MED ORDER — KCL IN DEXTROSE-NACL 20-5-0.45 MEQ/L-%-% IV SOLN
INTRAVENOUS | Status: DC
  Filled 2019-04-05: qty 1000

## 2019-04-05 MED ORDER — MIDAZOLAM HCL 2 MG/2ML IJ SOLN
1.0000 mg | INTRAMUSCULAR | Status: DC | PRN
  Administered 2019-04-05 (×2): 2 mg via INTRAVENOUS

## 2019-04-05 MED ORDER — EPHEDRINE 5 MG/ML INJ
INTRAVENOUS | Status: AC
  Filled 2019-04-05: qty 10

## 2019-04-05 MED ORDER — DEXAMETHASONE SODIUM PHOSPHATE 4 MG/ML IJ SOLN
INTRAMUSCULAR | Status: DC | PRN
  Administered 2019-04-05: 10 mg via INTRAVENOUS

## 2019-04-05 MED ORDER — DIAZEPAM 2 MG PO TABS
2.0000 mg | ORAL_TABLET | Freq: Two times a day (BID) | ORAL | Status: DC | PRN
  Administered 2019-04-05: 2 mg via ORAL
  Filled 2019-04-05: qty 1

## 2019-04-05 MED ORDER — CELECOXIB 200 MG PO CAPS
ORAL_CAPSULE | ORAL | Status: AC
  Filled 2019-04-05: qty 1

## 2019-04-05 MED ORDER — GABAPENTIN 300 MG PO CAPS
300.0000 mg | ORAL_CAPSULE | ORAL | Status: AC
  Administered 2019-04-05: 300 mg via ORAL

## 2019-04-05 MED ORDER — DEXAMETHASONE SODIUM PHOSPHATE 10 MG/ML IJ SOLN
INTRAMUSCULAR | Status: AC
  Filled 2019-04-05: qty 1

## 2019-04-05 MED ORDER — OXYCODONE HCL 5 MG PO TABS: 5.0000 mg | ORAL_TABLET | Freq: Once | ORAL | Status: DC | PRN

## 2019-04-05 MED ORDER — PROPOFOL 500 MG/50ML IV EMUL
INTRAVENOUS | Status: AC
  Filled 2019-04-05: qty 50

## 2019-04-05 MED ORDER — ONDANSETRON HCL 4 MG/2ML IJ SOLN: 4.0000 mg | Freq: Once | INTRAMUSCULAR | Status: DC | PRN

## 2019-04-05 MED ORDER — ROCURONIUM BROMIDE 10 MG/ML (PF) SYRINGE
PREFILLED_SYRINGE | INTRAVENOUS | Status: AC
  Filled 2019-04-05: qty 10

## 2019-04-05 MED ORDER — CEFAZOLIN SODIUM-DEXTROSE 2-4 GM/100ML-% IV SOLN
2.0000 g | Freq: Three times a day (TID) | INTRAVENOUS | Status: DC
  Administered 2019-04-05 – 2019-04-06 (×2): 2 g via INTRAVENOUS
  Filled 2019-04-05: qty 100

## 2019-04-05 MED ORDER — HYDROMORPHONE HCL 1 MG/ML IJ SOLN
0.5000 mg | INTRAMUSCULAR | Status: DC | PRN
  Administered 2019-04-05 – 2019-04-06 (×2): 0.5 mg via INTRAVENOUS
  Filled 2019-04-05: qty 0.5

## 2019-04-05 MED ORDER — ACETAMINOPHEN 160 MG/5ML PO SOLN: 325.0000 mg | ORAL | Status: DC | PRN

## 2019-04-05 MED ORDER — PROPOFOL 10 MG/ML IV BOLUS
INTRAVENOUS | Status: DC | PRN
  Administered 2019-04-05: 150 mg via INTRAVENOUS

## 2019-04-05 MED ORDER — BUPIVACAINE LIPOSOME 1.3 % IJ SUSP
INTRAMUSCULAR | Status: DC | PRN
  Administered 2019-04-05: 10 mL

## 2019-04-05 MED ORDER — GABAPENTIN 300 MG PO CAPS
ORAL_CAPSULE | ORAL | Status: AC
  Filled 2019-04-05: qty 1

## 2019-04-05 MED ORDER — LIDOCAINE 2% (20 MG/ML) 5 ML SYRINGE
INTRAMUSCULAR | Status: AC
  Filled 2019-04-05: qty 5

## 2019-04-05 MED ORDER — LIDOCAINE HCL (CARDIAC) PF 100 MG/5ML IV SOSY
PREFILLED_SYRINGE | INTRAVENOUS | Status: DC | PRN
  Administered 2019-04-05: 75 mg via INTRAVENOUS

## 2019-04-05 MED ORDER — ONDANSETRON HCL 4 MG/2ML IJ SOLN
INTRAMUSCULAR | Status: AC
  Filled 2019-04-05: qty 2

## 2019-04-05 MED ORDER — CELECOXIB 200 MG PO CAPS
200.0000 mg | ORAL_CAPSULE | ORAL | Status: AC
  Administered 2019-04-05: 200 mg via ORAL

## 2019-04-05 MED ORDER — ACETAMINOPHEN 325 MG PO TABS: 325.0000 mg | ORAL_TABLET | ORAL | Status: DC | PRN

## 2019-04-05 MED ORDER — CEFAZOLIN SODIUM-DEXTROSE 2-4 GM/100ML-% IV SOLN
2.0000 g | INTRAVENOUS | Status: AC
  Administered 2019-04-05: 2 g via INTRAVENOUS

## 2019-04-05 MED ORDER — FENTANYL CITRATE (PF) 100 MCG/2ML IJ SOLN
25.0000 ug | INTRAMUSCULAR | Status: DC | PRN
  Administered 2019-04-05: 25 ug via INTRAVENOUS
  Administered 2019-04-05: 50 ug via INTRAVENOUS
  Administered 2019-04-05: 25 ug via INTRAVENOUS

## 2019-04-05 MED ORDER — ONDANSETRON HCL 4 MG/2ML IJ SOLN: 4.0000 mg | Freq: Four times a day (QID) | INTRAMUSCULAR | Status: DC | PRN

## 2019-04-05 MED ORDER — PROPOFOL 500 MG/50ML IV EMUL
INTRAVENOUS | Status: DC | PRN
  Administered 2019-04-05: 15 ug/kg/min via INTRAVENOUS

## 2019-04-05 MED ORDER — ACETAMINOPHEN 325 MG PO TABS
325.0000 mg | ORAL_TABLET | Freq: Four times a day (QID) | ORAL | Status: DC
  Administered 2019-04-05 – 2019-04-06 (×3): 325 mg via ORAL
  Filled 2019-04-05 (×3): qty 1

## 2019-04-05 MED ORDER — ONDANSETRON 4 MG PO TBDP: 4.0000 mg | ORAL_TABLET | Freq: Four times a day (QID) | ORAL | Status: DC | PRN

## 2019-04-05 MED ORDER — ONDANSETRON HCL 4 MG/2ML IJ SOLN
INTRAMUSCULAR | Status: DC | PRN
  Administered 2019-04-05: 4 mg via INTRAVENOUS

## 2019-04-05 MED ORDER — TECHNETIUM TC 99M SULFUR COLLOID FILTERED
1.0000 | Freq: Once | INTRAVENOUS | Status: AC | PRN
  Administered 2019-04-05: 1 via INTRADERMAL

## 2019-04-05 MED ORDER — ROCURONIUM BROMIDE 100 MG/10ML IV SOLN
INTRAVENOUS | Status: DC | PRN
  Administered 2019-04-05: 50 mg via INTRAVENOUS

## 2019-04-05 MED ORDER — HYDROCODONE-ACETAMINOPHEN 5-325 MG PO TABS
1.0000 | ORAL_TABLET | ORAL | Status: DC | PRN
  Administered 2019-04-05 – 2019-04-06 (×2): 1 via ORAL
  Filled 2019-04-05 (×2): qty 1

## 2019-04-05 MED ORDER — LACTATED RINGERS IV SOLN: INTRAVENOUS | Status: DC

## 2019-04-05 MED ORDER — DIPHENHYDRAMINE HCL 50 MG/ML IJ SOLN: 12.5000 mg | Freq: Four times a day (QID) | INTRAMUSCULAR | Status: DC | PRN

## 2019-04-05 SURGICAL SUPPLY — 87 items
ADH SKN CLS APL DERMABOND .7 (GAUZE/BANDAGES/DRESSINGS) ×2
APL PRP STRL LF DISP 70% ISPRP (MISCELLANEOUS) ×2
APPLIER CLIP 9.375 MED OPEN (MISCELLANEOUS) ×3
APR CLP MED 9.3 20 MLT OPN (MISCELLANEOUS) ×1
BAG DECANTER FOR FLEXI CONT (MISCELLANEOUS) ×3 IMPLANT
BINDER BREAST LRG (GAUZE/BANDAGES/DRESSINGS) ×2 IMPLANT
BINDER BREAST MEDIUM (GAUZE/BANDAGES/DRESSINGS) IMPLANT
BINDER BREAST XLRG (GAUZE/BANDAGES/DRESSINGS) IMPLANT
BINDER BREAST XXLRG (GAUZE/BANDAGES/DRESSINGS) IMPLANT
BIOPATCH RED 1 DISK 7.0 (GAUZE/BANDAGES/DRESSINGS) ×3 IMPLANT
BIOPATCH RED 1IN DISK 7.0MM (GAUZE/BANDAGES/DRESSINGS) ×2
BLADE HEX COATED 2.75 (ELECTRODE) ×3 IMPLANT
BLADE SURG 10 STRL SS (BLADE) ×3 IMPLANT
BLADE SURG 15 STRL LF DISP TIS (BLADE) ×1 IMPLANT
BLADE SURG 15 STRL SS (BLADE) ×3
BNDG GAUZE ELAST 4 BULKY (GAUZE/BANDAGES/DRESSINGS) ×6 IMPLANT
CANISTER SUCT 1200ML W/VALVE (MISCELLANEOUS) ×3 IMPLANT
CHLORAPREP W/TINT 26 (MISCELLANEOUS) ×5 IMPLANT
CLIP APPLIE 9.375 MED OPEN (MISCELLANEOUS) ×1 IMPLANT
COVER BACK TABLE 60X90IN (DRAPES) ×3 IMPLANT
COVER MAYO STAND STRL (DRAPES) ×5 IMPLANT
COVER PROBE W GEL 5X96 (DRAPES) ×3 IMPLANT
COVER WAND RF STERILE (DRAPES) IMPLANT
DECANTER SPIKE VIAL GLASS SM (MISCELLANEOUS) IMPLANT
DERMABOND ADVANCED (GAUZE/BANDAGES/DRESSINGS) ×4
DERMABOND ADVANCED .7 DNX12 (GAUZE/BANDAGES/DRESSINGS) ×1 IMPLANT
DEVICE DISSECT PLASMABLAD 3.0S (MISCELLANEOUS) ×1 IMPLANT
DRAIN CHANNEL 19F RND (DRAIN) ×5 IMPLANT
DRAPE LAPAROSCOPIC ABDOMINAL (DRAPES) ×3 IMPLANT
DRAPE SURG 17X23 STRL (DRAPES) ×12 IMPLANT
DRAPE UTILITY XL STRL (DRAPES) ×3 IMPLANT
DRSG PAD ABDOMINAL 8X10 ST (GAUZE/BANDAGES/DRESSINGS) ×6 IMPLANT
ELECT BLADE 4.0 EZ CLEAN MEGAD (MISCELLANEOUS) ×3
ELECT COATED BLADE 2.86 ST (ELECTRODE) ×3 IMPLANT
ELECT REM PT RETURN 9FT ADLT (ELECTROSURGICAL) ×6
ELECTRODE BLDE 4.0 EZ CLN MEGD (MISCELLANEOUS) ×1 IMPLANT
ELECTRODE REM PT RTRN 9FT ADLT (ELECTROSURGICAL) ×1 IMPLANT
EVACUATOR SILICONE 100CC (DRAIN) ×5 IMPLANT
GAUZE SPONGE 4X4 12PLY STRL LF (GAUZE/BANDAGES/DRESSINGS) ×3 IMPLANT
GLOVE BIO SURGEON STRL SZ 6.5 (GLOVE) ×7 IMPLANT
GLOVE BIO SURGEON STRL SZ7.5 (GLOVE) ×3 IMPLANT
GLOVE BIO SURGEONS STRL SZ 6.5 (GLOVE) ×5
GOWN STRL REUS W/ TWL LRG LVL3 (GOWN DISPOSABLE) ×3 IMPLANT
GOWN STRL REUS W/TWL LRG LVL3 (GOWN DISPOSABLE) ×15
GRAFT FLEX HD 6X16 PLIABLE (Tissue) ×4 IMPLANT
ILLUMINATOR WAVEGUIDE N/F (MISCELLANEOUS) IMPLANT
IMPL BREAST 300CC (Breast) IMPLANT
IMPLANT BREAST 300CC (Breast) ×6 IMPLANT
IV NS 1000ML (IV SOLUTION)
IV NS 1000ML BAXH (IV SOLUTION) IMPLANT
IV NS 500ML (IV SOLUTION) ×3
IV NS 500ML BAXH (IV SOLUTION) ×1 IMPLANT
KIT FILL SYSTEM UNIVERSAL (SET/KITS/TRAYS/PACK) IMPLANT
LIGHT WAVEGUIDE WIDE FLAT (MISCELLANEOUS) IMPLANT
NDL HYPO 25X1 1.5 SAFETY (NEEDLE) ×1 IMPLANT
NEEDLE HYPO 25X1 1.5 SAFETY (NEEDLE) ×3 IMPLANT
NS IRRIG 1000ML POUR BTL (IV SOLUTION) ×3 IMPLANT
PACK BASIN DAY SURGERY FS (CUSTOM PROCEDURE TRAY) ×3 IMPLANT
PENCIL SMOKE EVACUATOR (MISCELLANEOUS) ×3 IMPLANT
PIN SAFETY STERILE (MISCELLANEOUS) ×3 IMPLANT
PLASMABLADE 3.0S (MISCELLANEOUS) ×6
SET ASEPTIC TRANSFER (MISCELLANEOUS) ×4 IMPLANT
SLEEVE SCD COMPRESS KNEE MED (MISCELLANEOUS) ×3 IMPLANT
SPONGE LAP 18X18 RF (DISPOSABLE) ×6 IMPLANT
STRIP SUTURE WOUND CLOSURE 1/2 (MISCELLANEOUS) IMPLANT
SUT ETHILON 2 0 FS 18 (SUTURE) ×3 IMPLANT
SUT MNCRL AB 4-0 PS2 18 (SUTURE) ×5 IMPLANT
SUT MON AB 3-0 SH 27 (SUTURE) ×6
SUT MON AB 3-0 SH27 (SUTURE) ×1 IMPLANT
SUT MON AB 5-0 PS2 18 (SUTURE) ×5 IMPLANT
SUT PDS 3-0 CT2 (SUTURE)
SUT PDS AB 2-0 CT2 27 (SUTURE) ×12 IMPLANT
SUT PDS II 3-0 CT2 27 ABS (SUTURE) IMPLANT
SUT SILK 2 0 SH (SUTURE) IMPLANT
SUT SILK 3 0 PS 1 (SUTURE) ×5 IMPLANT
SUT VIC AB 3-0 SH 27 (SUTURE)
SUT VIC AB 3-0 SH 27X BRD (SUTURE) IMPLANT
SUT VICRYL 3-0 CR8 SH (SUTURE) ×2 IMPLANT
SYR BULB IRRIGATION 50ML (SYRINGE) ×3 IMPLANT
SYR CONTROL 10ML LL (SYRINGE) ×3 IMPLANT
TOWEL GREEN STERILE FF (TOWEL DISPOSABLE) ×6 IMPLANT
TRAY DSU PREP LF (CUSTOM PROCEDURE TRAY) ×3 IMPLANT
TRAY FOLEY W/BAG SLVR 14FR LF (SET/KITS/TRAYS/PACK) IMPLANT
TUBE CONNECTING 20'X1/4 (TUBING) ×1
TUBE CONNECTING 20X1/4 (TUBING) ×2 IMPLANT
UNDERPAD 30X36 HEAVY ABSORB (UNDERPADS AND DIAPERS) ×6 IMPLANT
YANKAUER SUCT BULB TIP NO VENT (SUCTIONS) ×3 IMPLANT

## 2019-04-05 NOTE — Progress Notes (Signed)
Assisted Dr. Oddono with right, ultrasound guided, pectoralis block. Side rails up, monitors on throughout procedure. See vital signs in flow sheet. Tolerated Procedure well. 

## 2019-04-05 NOTE — Op Note (Signed)
04/05/2019  10:21 AM  PATIENT:  [name]  64 y.o. female  PRE-OPERATIVE DIAGNOSIS:  RIGHT BREAST DCIS  POST-OPERATIVE DIAGNOSIS:  RIGHT BREAST DCIS  PROCEDURE:  Procedure(s): RIGHT MASTECTOMY WITH DEEP RIGHT AXILLARY SENTINEL LYMPH NODE BIOPSY  LEFT PROPHYLACTIC MASTECTOMY  SURGEON:  Surgeon(s) and Role: Panel 1:    * [name], MD - Primary   PHYSICIAN ASSISTANT:   ASSISTANTS: none   ANESTHESIA:   general  EBL:  5 mL   BLOOD ADMINISTERED:none  DRAINS: none   LOCAL MEDICATIONS USED:  NONE  SPECIMEN:  Source of Specimen:  LEFT MASTECTOMY, RIGHT MASTECTOMY AND SENTINEL NODE  DISPOSITION OF SPECIMEN:  PATHOLOGY  COUNTS:  YES  TOURNIQUET:  * No tourniquets in log *  DICTATION: .Dragon Dictation   After informed consent was obtained the patient was brought to the operating room and placed in the supine position on the operating table.  After adequate induction of general anesthesia the patient's bilateral chest, breast, and axillary areas were prepped with ChloraPrep, allowed to dry, and draped in usual sterile manner.  An appropriate timeout was performed.  Earlier in the day the patient underwent injection of 1 mCi of technetium sulfur colloid in the subareolar position on the right.  The neoprobe was set to technetium and an area of radioactivity was readily identified in the right axilla.  Attention was first turned to the left breast.  An elliptical incision was made around the nipple and areola complex in order to spare the skin.  The incision was carried through the skin and subcutaneous tissue sharply with the PlasmaBlade.  Skin hooks were then used to elevate the skin flaps anteriorly towards the ceiling.  Thin skin flaps were then created circumferentially by dissecting between the breast tissue and the subcutaneous fat.  This dissection was carried all the way to the chest wall.  Next the breast was removed from the pectoralis muscle with the  pectoralis fascia.  Once this was accomplished the entire breast was removed.  It was oriented with a stitch on the lateral skin and sent to pathology for further evaluation.  Hemostasis was achieved using the PlasmaBlade.  The wound was irrigated with saline and packed with a moistened lap sponge.  Attention was then turned to the right breast.  A similar elliptical incision was made around the nipple and areole out to spare the skin.  The incision was carried through the skin and subcutaneous tissue sharply with the PlasmaBlade.  Skin hooks were then used to elevate the skin flaps anteriorly towards the saline and thin skin flaps were then created circumferentially by dissection between the breast tissue and the subcutaneous fat.  This dissection was carried all the way to the chest wall.  Next the breast was removed from the pectoralis muscle with the pectoralis fashion.  Once this was accomplished the entire right breast was removed and was oriented with a stitch in the lateral skin.  It was sent to pathology for further evaluation.  Dissection was then carried into the deep right axillary space under the direction of the neoprobe.  I was able to identify a hot lymph node.  This lymph node was excised sharply with the PlasmaBlade and the surrounding lymphatics and small vessels were controlled with clips.  Ex vivo counts on this node were approximately 300.  No other hot or palpable lymph nodes were identified in the right axilla.  Hemostasis was achieved using the PlasmaBlade.  The wound was  irrigated with saline and packed with a moistened lap sponge.  At this point the operation was turned over to Dr. Wynona Luna for the reconstruction.  Her portion will be dictated separately.  The patient was tolerating the operation well.  All needle sponge and instrument counts were correct.  The patient was in stable condition.  PLAN OF CARE: Admit for overnight observation  PATIENT DISPOSITION:  PACU -  hemodynamically stable.   Delay start of Pharmacological VTE agent (>24hrs) due to surgical blood loss or risk of bleeding: not applicable

## 2019-04-05 NOTE — Addendum Note (Signed)
Addendum  created 04/05/19 1542 by Willa Frater, CRNA   Charge Capture section accepted

## 2019-04-05 NOTE — Interval H&P Note (Signed)
History and Physical Interval Note:  04/05/2019 8:14 AM  [name]  has presented today for surgery, with the diagnosis of RIGHT BREAST DCIS.  The various methods of treatment have been discussed with the patient and family. After consideration of risks, benefits and other options for treatment, the patient has consented to  Procedure(s): BILATERAL MASTECTOMIES WITH RIGHT SENTINEL LYMPH NODE BIOPSY (Bilateral) BILATERAL BREAST RECONSTRUCTION WITH PLACEMENT OF TISSUE EXPANDER AND FLEX HD (ACELLULAR HYDRATED DERMIS) (Bilateral) as a surgical intervention.  The patient's history has been reviewed, patient examined, no change in status, stable for surgery.  I have reviewed the patient's chart and labs.  Questions were answered to the patient's satisfaction.     Loel Lofty Cailen Mihalik

## 2019-04-05 NOTE — Anesthesia Procedure Notes (Signed)
Anesthesia Regional Block: Pectoralis block   Pre-Anesthetic Checklist: ,, timeout performed, Correct Patient, Correct Site, Correct Laterality, Correct Procedure, Correct Position, site marked, Risks and benefits discussed,  Surgical consent,  Pre-op evaluation,  At surgeon's request and post-op pain management  Laterality: Right  Prep: chloraprep       Needles:  Injection technique: Single-shot  Needle Type: Echogenic Stimulator Needle     Needle Length: 5cm  Needle Gauge: 22     Additional Needles:   Procedures:, nerve stimulator,,, ultrasound used (permanent image in chart),,,,  Narrative:  Start time: 04/05/2019 7:35 AM End time: 04/05/2019 7:40 AM Injection made incrementally with aspirations every 5 mL.  Performed by: Personally  Anesthesiologist: Janeece Riggers, MD  Additional Notes: Functioning IV was confirmed and monitors were applied.  A 31mm 22ga Arrow echogenic stimulator needle was used. Sterile prep and drape,hand hygiene and sterile gloves were used. Ultrasound guidance: relevant anatomy identified, needle position confirmed, local anesthetic spread visualized around nerve(s)., vascular puncture avoided.  Image printed for medical record. Negative aspiration and negative test dose prior to incremental administration of local anesthetic. The patient tolerated the procedure well.

## 2019-04-05 NOTE — Anesthesia Procedure Notes (Signed)
Procedure Name: Intubation Date/Time: 04/05/2019 8:47 AM Performed by: Willa Frater, CRNA Pre-anesthesia Checklist: Patient identified, Emergency Drugs available, Suction available and Patient being monitored Patient Re-evaluated:Patient Re-evaluated prior to induction Oxygen Delivery Method: Circle system utilized Preoxygenation: Pre-oxygenation with 100% oxygen Induction Type: IV induction Ventilation: Mask ventilation without difficulty Laryngoscope Size: Mac and 3 Grade View: Grade I Tube type: Oral Number of attempts: 1 Airway Equipment and Method: Stylet and Oral airway Placement Confirmation: ETT inserted through vocal cords under direct vision,  positive ETCO2 and breath sounds checked- equal and bilateral Tube secured with: Tape Dental Injury: Teeth and Oropharynx as per pre-operative assessment

## 2019-04-05 NOTE — Interval H&P Note (Signed)
History and Physical Interval Note:  04/05/2019 8:10 AM  [name]  has presented today for surgery, with the diagnosis of RIGHT BREAST DCIS.  The various methods of treatment have been discussed with the patient and family. After consideration of risks, benefits and other options for treatment, the patient has consented to  Procedure(s): BILATERAL MASTECTOMIES WITH RIGHT SENTINEL LYMPH NODE BIOPSY (Bilateral) BILATERAL BREAST RECONSTRUCTION WITH PLACEMENT OF TISSUE EXPANDER AND FLEX HD (ACELLULAR HYDRATED DERMIS) (Bilateral) as a surgical intervention.  The patient's history has been reviewed, patient examined, no change in status, stable for surgery.  I have reviewed the patient's chart and labs.  Questions were answered to the patient's satisfaction.     [name]

## 2019-04-05 NOTE — Op Note (Signed)
Op report    DATE OF OPERATION:  04/05/2019  LOCATION: [facility]  SURGICAL DIVISION: Plastic Surgery  PREOPERATIVE DIAGNOSES:  1. Right Breast cancer.    POSTOPERATIVE DIAGNOSES:  1. Right Breast cancer.   PROCEDURE:  1. Bilateral immediate breast reconstruction with placement of Acellular Dermal Matrix and tissue expanders.  SURGEON: Sameeha Rockefeller Sanger Edilia Ghuman, DO  ASSISTANT: Phoebe Sharps, PA  ANESTHESIA:  General.   COMPLICATIONS: None.   IMPLANTS: Left - Mentor 300 cc. Ref [id number].  Serial Number [id number], 200 cc of injectable saline placed in the expander. Right - Mentor 300 cc. Ref [id number].  Serial Number [id number], 200 cc of injectable saline placed in the expander. Acellular Dermal Matrix 6 x 16 cm x two Flex HD  INDICATIONS FOR PROCEDURE:  The patient, [name], is a 64 y.o. female born on 1955-09-25, is here for  immediate first stage breast reconstruction with placement of bilateral tissue expander and Acellular dermal matrix. MRN: [id number]  CONSENT:  Informed consent was obtained directly from the patient. Risks, benefits and alternatives were fully discussed. Specific risks including but not limited to bleeding, infection, hematoma, seroma, scarring, pain, implant infection, implant extrusion, capsular contracture, asymmetry, wound healing problems, and need for further surgery were all discussed. The patient did have an ample opportunity to have her questions answered to her satisfaction.   DESCRIPTION OF PROCEDURE:  The patient was taken to the operating room by the general surgery team. SCDs were placed and IV antibiotics were given. The patient's chest was prepped and draped in a sterile fashion. A time out was performed and the implants to be used were identified.  Bilateral mastectomies were performed.  Once the general surgery team had completed their portion of the case the patient was rendered to the plastic and  reconstructive surgery team.  Left:  The pectoralis major muscle was lifted from the chest wall with release of the lateral edge and lateral inframammary fold.  The pocket was irrigated with antibiotic solution and hemostasis was achieved with electrocautery.  The ADM was then prepared according to the manufacture guidelines and slits placed to help with postoperative fluid management.  The ADM was then sutured to the inferior and lateral edge of the inframammary fold with 2-0 PDS starting with an interrupted stitch and then a running stitch.  The lateral portion was sutured to with interrupted sutures after the expander was placed.  The expander was prepared according to the manufacture guidelines, the air evacuated and then it was placed under the ADM and pectoralis major muscle.  The inferior and lateral tabs were used to secure the expander to the chest wall with 2-0 PDS.  The drain was placed at the inframammary fold over the ADM and secured to the skin with 3-0 Silk.  The deep layers were closed with 3-0 Monocryl followed by 4-0 Monocryl.  The skin was closed with 5-0 Monocryl and then dermabond was applied.    Right:  The pectoralis major muscle was lifted from the chest wall with release of the lateral edge and lateral inframammary fold.  The pocket was irrigated with antibiotic solution and hemostasis was achieved with electrocautery.  The ADM was then prepared according to the manufacture guidelines and slits placed to help with postoperative fluid management.  The ADM was then sutured to the inferior and lateral edge of the inframammary fold with 2-0 PDS starting with an interrupted stitch and then a running stitch.  The lateral portion was sutured to  with interrupted sutures after the expander was placed.  The expander was prepared according to the manufacture guidelines, the air evacuated and then it was placed under the ADM and pectoralis major muscle.  The inferior and lateral tabs were used to  secure the expander to the chest wall with 2-0 PDS.  The drain was placed at the inframammary fold over the ADM and secured to the skin with 3-0 Silk.    The deep layers were closed with 3-0 Monocryl followed by 4-0 Monocryl.  The skin was closed with 5-0 Monocryl and then dermabond was applied.  The ABDs and breast binder were placed.  The patient tolerated the procedure well and there were no complications.  The patient was allowed to wake from anesthesia and taken to the recovery room in satisfactory condition.

## 2019-04-05 NOTE — Anesthesia Postprocedure Evaluation (Signed)
Anesthesia Post Note  Patient: Kyanne Eskin  Procedure(s) Performed: BILATERAL MASTECTOMIES WITH RIGHT SENTINEL LYMPH NODE BIOPSY (Bilateral Breast) BILATERAL BREAST RECONSTRUCTION WITH PLACEMENT OF TISSUE EXPANDER AND FLEX HD (ACELLULAR HYDRATED DERMIS) (Bilateral Breast)     Patient location during evaluation: PACU Anesthesia Type: General Level of consciousness: awake and alert Pain management: pain level controlled Vital Signs Assessment: post-procedure vital signs reviewed and stable Respiratory status: spontaneous breathing, nonlabored ventilation, respiratory function stable and patient connected to nasal cannula oxygen Cardiovascular status: blood pressure returned to baseline and stable Postop Assessment: no apparent nausea or vomiting Anesthetic complications: no    Last Vitals:  Vitals:   04/05/19 1400 04/05/19 1510  BP:    Pulse: 98 94  Resp: 20 16  Temp:    SpO2: 98% 97%    Last Pain:  Vitals:   04/05/19 1400  TempSrc:   PainSc: 4                  Leeam Cedrone

## 2019-04-05 NOTE — Transfer of Care (Signed)
Immediate Anesthesia Transfer of Care Note  Patient: Sharyle Vanderberg  Procedure(s) Performed: BILATERAL MASTECTOMIES WITH RIGHT SENTINEL LYMPH NODE BIOPSY (Bilateral Breast) BILATERAL BREAST RECONSTRUCTION WITH PLACEMENT OF TISSUE EXPANDER AND FLEX HD (ACELLULAR HYDRATED DERMIS) (Bilateral Breast)  Patient Location: PACU  Anesthesia Type:General  Level of Consciousness: sedated  Airway & Oxygen Therapy: Patient Spontanous Breathing and Patient connected to face mask oxygen  Post-op Assessment: Report given to RN and Post -op Vital signs reviewed and stable  Post vital signs: Reviewed and stable  Last Vitals:  Vitals Value Taken Time  BP    Temp    Pulse 91 04/05/19 1125  Resp 15 04/05/19 1125  SpO2 99 % 04/05/19 1125  Vitals shown include unvalidated device data.  Last Pain:  Vitals:   04/05/19 0718  TempSrc: Temporal  PainSc: 0-No pain      Patients Stated Pain Goal: 7 ([id number])  Complications: No apparent anesthesia complications

## 2019-04-05 NOTE — Discharge Instructions (Signed)
INSTRUCTIONS FOR AFTER BREAST SURGERY   You are getting ready to undergo breast surgery.  You will likely have some questions about what to expect following your operation.  The following information will help you and your family understand what to expect when you are discharged from the hospital.  Following these guidelines will help ensure a smooth recovery and reduce risks of complications.   Postoperative instructions include information on: diet, wound care, medications and physical activity.  AFTER SURGERY Expect to go home after the procedure.  In some cases, you may need to spend one night in the hospital for observation.  DIET Breast surgery does not require a specific diet.  However, the healthier you eat the better your body can start healing. It is important to increasing your protein intake.  This means limiting the foods with sugar and carbohydrates.  Focus on vegetables and some meat.  If you have any liposuction during your procedure be sure to drink water.  If your urine is bright yellow, then it is concentrated, and you need to drink more water.  As a general rule after surgery, you should have 8 ounces of water every hour while awake.  If you find you are persistently nauseated or unable to take in liquids let us know.  NO TOBACCO USE or EXPOSURE.  This will slow your healing process and increase the risk of a wound.  WOUND CARE If you have a drain: You can shower five days after surgery.  Clean with baby wipes until the drain is removed.    If you have steri-strips / tape directly attached to your skin leave them in place. It is OK to get these wet.  No baths, pools or hot tubs for two weeks. We close your incision to leave the smallest and best-looking scar. No ointment or creams on your incisions until given the go ahead.  Especially not Neosporin (Too many skin reactions with this one).  A few weeks after surgery you can use Mederma and start massaging the scar. We ask you to  wear your binder or sports bra for the first 6 weeks around the clock, including while sleeping. This provides added comfort and helps reduce the fluid accumulation at the surgery site.  ACTIVITY No heavy lifting until cleared by the doctor.  This usually means no more than a half-gallon of milk.  It is OK to walk and climb stairs. In fact, moving your legs is very important to decrease your risk of a blood clot.  It will also help keep you from getting deconditioned.  Every 1 to 2 hours get up and walk for 5 minutes. This will help with a quicker recovery back to normal.  Let pain be your guide so you don't do too much.  This is not the time for spring cleaning and don't plan on taking care of anyone else.  This time is for you to recover,  You will be more comfortable if you sleep and rest with your head elevated either with a few pillows under you or in a recliner.  No stomach sleeping for a three months.  WORK Everyone returns to work at different times. As a rough guide, most people take at least 1 - 2 weeks off prior to returning to work. If you need documentation for your job, bring the forms to your postoperative follow up visit.  DRIVING Arrange for someone to bring you home from the hospital.  You may be able to drive a  few days after surgery but not while taking any narcotics or valium.  BOWEL MOVEMENTS Constipation can occur after anesthesia and while taking pain medication.  It is important to stay ahead for your comfort.  We recommend taking Milk of Magnesia (2 tablespoons; twice a day) while taking the pain pills.  SEROMA This is fluid your body tried to put in the surgical site.  This is normal but if it creates tight skinny skin let us know.  It usually decreases in a few weeks.  MEDICATIONS and PAIN CONTROL At your preoperative visit for you history and physical you were given the following medications: 1. An antibiotic: Start this medication when you get home and take according  to the instructions on the bottle. 2. Zofran 4 mg:  This is to treat nausea and vomiting.  You can take this every 6 hours as needed and only if needed. 3. Valium 2 mg: This is for muscle tightness if you have an implant or expander. This will help relax your muscle which also helps with pain control.  This can be taken every 12 hours as needed.  Don't drive after taking this medication. 4. Norco (hydrocodone/acetaminophen) 5/325 mg:  This is only to be used after you have taken the motrin or the tylenol. Every 8 hours as needed. Over the counter Medication to take: 5. Ibuprofen (Motrin) 600 mg:  Take this every 6 hours.  If you have additional pain then take 500 mg of the tylenol every 8 hours.  Only take the Norco after you have tried these two. 6. Miralax or stool softener of choice: Take this according to the bottle if you take the [place] Call your surgeon's office if any of the following occur: . Fever 101 degrees F or greater . Excessive bleeding or fluid from the incision site. . Pain that increases over time without aid from the medications . Redness, warmth, or pus draining from incision sites . Persistent nausea or inability to take in liquids . Severe misshapen area that underwent the operation.  Here are some resources:  1. Plastic surgery website: https://www.plasticsurgery.org/for-medical-professionals/education-and-resources/publications/breast-reconstruction-magazine 2. Breast Reconstruction Awareness Campaign:  [employer]co[employer]nz 3. Plastic surgery Implant information:  https://www.plasticsurgery.org/patient-safety/breast-implant-safety  About my Jackson-Pratt Bulb Drain  What is a Jackson-Pratt bulb? A Jackson-Pratt is a soft, round device used to collect drainage. It is connected to a long, thin drainage catheter, which is held in place by one or two small stiches near your surgical incision site. When the bulb is squeezed, it forms a vacuum,  forcing the drainage to empty into the bulb.  Emptying the Jackson-Pratt bulb- To empty the bulb: 1. Release the plug on the top of the bulb. 2. Pour the bulb's contents into a measuring container which your nurse will provide. 3. Record the time emptied and amount of drainage. Empty the drain(s) as often as your     doctor or nurse recommends.  Date                  Time                    Amount (Drain 1)                 Amount (Drain 2)  _____________________________________________________________________  _____________________________________________________________________  _____________________________________________________________________  _____________________________________________________________________  _____________________________________________________________________  _____________________________________________________________________  _____________________________________________________________________  _____________________________________________________________________  Squeezing the Jackson-Pratt Bulb- To squeeze the bulb: 1. Make sure the plug at the top of the bulb is  open. 2. Squeeze the bulb tightly in your fist. You will hear air squeezing from the bulb. 3. Replace the plug while the bulb is squeezed. 4. Use a safety pin to attach the bulb to your clothing. This will keep the catheter from     pulling at the bulb insertion site.  When to call your doctor- Call your doctor if:  Drain site becomes red, swollen or hot.  You have a fever greater than 101 degrees F.  There is oozing at the drain site.  Drain falls out (apply a guaze bandage over the drain hole and secure it with tape).  Drainage increases daily not related to activity patterns. (You will usually have more drainage when you are active than when you are resting.)  Drainage has a bad odor.  Information for Discharge Teaching: EXPAREL (bupivacaine liposome injectable  suspension)   Your surgeon or anesthesiologist gave you EXPAREL(bupivacaine) to help control your pain after surgery.   EXPAREL is a local anesthetic that provides pain relief by numbing the tissue around the surgical site.  EXPAREL is designed to release pain medication over time and can control pain for up to 72 hours.  Depending on how you respond to EXPAREL, you may require less pain medication during your recovery.  Possible side effects:  Temporary loss of sensation or ability to move in the area where bupivacaine was injected.  Nausea, vomiting, constipation  Rarely, numbness and tingling in your mouth or lips, lightheadedness, or anxiety may occur.  Call your doctor right away if you think you may be experiencing any of these sensations, or if you have other questions regarding possible side effects.  Follow all other discharge instructions given to you by your surgeon or nurse. Eat a healthy diet and drink plenty of water or other fluids.  If you return to the hospital for any reason within 96 hours following the administration of EXPAREL, it is important for health care providers to know that you have received this anesthetic. A teal colored band has been placed on your arm with the date, time and amount of EXPAREL you have received in order to alert and inform your health care providers. Please leave this armband in place for the full 96 hours following administration, and then you may remove the band.

## 2019-04-05 NOTE — H&P (Signed)
[name]  Location: [facility] Surgery Patient #: [id number] DOB: Jul 28, 1955 Undefined / Language: [name] / Race: White Female   History of Present Illness  The patient is a 64 year old female who presents with breast cancer. We are asked to see the patient in consultation by Dr. Jana Hakim to evaluate her for a new right breast cancer. The patient is a 64 year old white female who presents with a 1.6cm area of calcs in the LOQ of the right breast. There was a second 37mm area of calcs in the UOQ that was felt to look benign on followup imaging. The larger area was biopsied and came back as high grade DCIS that was ER and PR-. The axilla looked neg. She has no family history of breast cancer and is otherwise healthy. she does not smoke   Past Surgical History  Breast Biopsy  Right. Colon Polyp Removal - Colonoscopy  Oral Surgery  Tonsillectomy   Diagnostic Studies History  Colonoscopy  1-5 years ago Mammogram  within last year Pap Smear  1-5 years ago  Medication History  Medications Reconciled  Social History  Alcohol use  Moderate alcohol use. No caffeine use  No drug use  Tobacco use  Never smoker.  Family History  Arthritis  Mother. Colon Cancer  Mother.  Pregnancy / Birth History Age at menarche  72 years. Age of menopause  [age] Contraceptive History  Oral contraceptives. Gravida  3 Length (months) of breastfeeding  3-6 Maternal age  42-30 Para  3  Other Problems  Arthritis  Breast Cancer  Gastroesophageal Reflux Disease  Heart murmur     Review of Systems General Not Present- Appetite Loss, Chills, Fatigue, Fever, Night Sweats, Weight Gain and Weight Loss. Skin Not Present- Change in Wart/Mole, Dryness, Hives, Jaundice, New Lesions, Non-Healing Wounds, Rash and Ulcer. HEENT Present- Wears glasses/contact lenses. Not Present- Earache, Hearing Loss, Hoarseness, Nose Bleed, Oral Ulcers, Ringing in the Ears, Seasonal Allergies,  Sinus Pain, Sore Throat, Visual Disturbances and Yellow Eyes. Respiratory Not Present- Bloody sputum, Chronic Cough, Difficulty Breathing, Snoring and Wheezing. Cardiovascular Not Present- Chest Pain, Difficulty Breathing Lying Down, Leg Cramps, Palpitations, Rapid Heart Rate, Shortness of Breath and Swelling of Extremities. Gastrointestinal Not Present- Abdominal Pain, Bloating, Bloody Stool, Change in Bowel Habits, Chronic diarrhea, Constipation, Difficulty Swallowing, Excessive gas, Gets full quickly at meals, Hemorrhoids, Indigestion, Nausea, Rectal Pain and Vomiting. Female Genitourinary Not Present- Frequency, Nocturia, Painful Urination, Pelvic Pain and Urgency. Musculoskeletal Not Present- Back Pain, Joint Pain, Joint Stiffness, Muscle Pain, Muscle Weakness and Swelling of Extremities. Neurological Not Present- Decreased Memory, Fainting, Headaches, Numbness, Seizures, Tingling, Tremor, Trouble walking and Weakness. Psychiatric Not Present- Anxiety, Bipolar, Change in Sleep Pattern, Depression, Fearful and Frequent crying. Endocrine Not Present- Cold Intolerance, Excessive Hunger, Hair Changes, Heat Intolerance, Hot flashes and New Diabetes. Hematology Not Present- Blood Thinners, Easy Bruising, Excessive bleeding, Gland problems, HIV and Persistent Infections.   Physical Exam  General Mental Status-Alert. General Appearance-Consistent with stated age. Hydration-Well hydrated. Voice-Normal.  Head and Neck Head-normocephalic, atraumatic with no lesions or palpable masses. Trachea-midline. Thyroid Gland Characteristics - normal size and consistency.  Eye Eyeball - Bilateral-Extraocular movements intact. Sclera/Conjunctiva - Bilateral-No scleral icterus.  Chest and Lung Exam Chest and lung exam reveals -quiet, even and easy respiratory effort with no use of accessory muscles and on auscultation, normal breath sounds, no adventitious sounds and normal vocal  resonance. Inspection Chest Wall - Normal. Back - normal.  Breast Note: She does have a smaller  breast. There is no palpable mass in either breast. There is no palpable axillary, supraclavicular, or cervical lymphadenopathy   Cardiovascular Cardiovascular examination reveals -normal heart sounds, regular rate and rhythm with no murmurs and normal pedal pulses bilaterally.  Abdomen Inspection Inspection of the abdomen reveals - No Hernias. Skin - Scar - no surgical scars. Palpation/Percussion Palpation and Percussion of the abdomen reveal - Soft, Non Tender, No Rebound tenderness, No Rigidity (guarding) and No hepatosplenomegaly. Auscultation Auscultation of the abdomen reveals - Bowel sounds normal.  Neurologic Neurologic evaluation reveals -alert and oriented x 3 with no impairment of recent or remote memory. Mental Status-Normal.  Musculoskeletal Normal Exam - Left-Upper Extremity Strength Normal and Lower Extremity Strength Normal. Normal Exam - Right-Upper Extremity Strength Normal and Lower Extremity Strength Normal.  Lymphatic Head & Neck  General Head & Neck Lymphatics: Bilateral - Description - Normal. Axillary  General Axillary Region: Bilateral - Description - Normal. Tenderness - Non Tender. Femoral & Inguinal  Generalized Femoral & Inguinal Lymphatics: Bilateral - Description - Normal. Tenderness - Non Tender.    Assessment & Plan  DUCTAL CARCINOMA IN SITU (DCIS) OF RIGHT BREAST (D05.11) Impression: The patient appears to have a 1.6cm area of dcis in the LOQ of the right breast. I have discussed with her the different options for treatment and at this point she is undecided about lumpectomy vs mastectomy. I will plan to evaluate her with MRI since there was a second area of calcs in the breast. I will also refer her to Dr. Marla Roe in Plastics for consultation about reconstruction. I will call her with the results of the MRI. Current Plans Referred  to Oncology, for evaluation and follow up (Oncology). Routine.   DUCTAL CARCINOMA IN SITU (DCIS) OF RIGHT BREAST (D05.11) Impression: I reviewed her MRI and the other two areas biopsied were positive for dcis with enhancement that approaches to back of the nipple. Given these findings we discussed right mastectomy with sentinel node biopsy as a better option for surgery. She also had some enhancement show up on the left side which they could not find at the time of second look. Because of this she also favors left mastectomy. Reconstruction will be coordinated with Dr. Marla Roe. Risks and benefits of the surgery were discussed in detail and she understands and wishes to proceed

## 2019-04-06 DIAGNOSIS — D0511 Intraductal carcinoma in situ of right breast: Secondary | ICD-10-CM | POA: Diagnosis not present

## 2019-04-06 NOTE — Discharge Summary (Signed)
Physician Discharge Summary  Patient ID: [name] MRN: [id number] DOB/AGE: 1955-11-06 64 y.o.  Admit date: 04/05/2019 Discharge date: 04/06/2019  Admission Diagnoses: Breast Cancer  Discharge Diagnoses:  Active Problems:   Breast cancer [facility])   Discharged Condition: good  Hospital Course: [name] is doing very well this morning laying in bed comfortably with her husband by her bedside.  No overnight events.  Denies chest pain, shortness of breath, nausea/vomiting.  She has been able to eat, drink, and ambulate.  Bilateral drains in place with serous sanguinous fluid in the bulb.  Incisions are C/D/I.  No redness, drainage, seroma/hematoma.  Dermabond, gauze, ABD, and binder in place.  Consults: None  Significant Diagnostic Studies: none  Treatments: surgery:   Discharge Exam: Blood pressure 116/77, pulse 77, temperature 98.7 F (37.1 C), resp. rate 16, height 5\' 7"  (1.702 m), weight 68 kg, SpO2 100 %. General appearance: alert, cooperative and no distress Head: Normocephalic, without obvious abnormality, atraumatic Eyes: EOMs intact Resp: normal effort Chest wall: symmetrci rise and fall Breasts: Bilateral incisions are c/d/i. No signs of redness, drainage, or seroma/hematoma. Bilateral drains in place with serosanguinous fluid in the bulb.  Disposition: Discharge disposition: 01-Home or Self Care       Discharge Instructions    Call MD for:  difficulty breathing, headache or visual disturbances   Complete by: As directed    Call MD for:  extreme fatigue   Complete by: As directed    Call MD for:  hives   Complete by: As directed    Call MD for:  persistant dizziness or light-headedness   Complete by: As directed    Call MD for:  persistant nausea and vomiting   Complete by: As directed    Call MD for:  redness, tenderness, or signs of infection (pain, swelling, redness, odor or green/yellow discharge around incision site)   Complete by: As directed    Call MD for:  severe uncontrolled pain   Complete by: As directed    Call MD for:  temperature >100.4   Complete by: As directed    Diet - low sodium heart healthy   Complete by: As directed    Increase activity slowly   Complete by: As directed      Allergies as of 04/06/2019   No Known Allergies     Medication List    TAKE these medications   cephALEXin 500 MG capsule Commonly known as: KEFLEX Take 1 capsule (500 mg total) by mouth 4 (four) times daily for 5 days.   diazepam 2 MG tablet Commonly known as: Valium Take 1 tablet (2 mg total) by mouth every 12 (twelve) hours as needed for anxiety.   famotidine 40 MG tablet Commonly known as: PEPCID Take 40 mg by mouth daily.   HYDROcodone-acetaminophen 5-325 MG tablet Commonly known as: Norco Take 1 tablet by mouth every 6 (six) hours as needed for up to 5 days for severe pain.   MULTI-VITAMIN DAILY PO Take by mouth.   omega-3 acid ethyl esters 1 g capsule Commonly known as: LOVAZA Take 1 g by mouth 2 (two) times daily.   ondansetron 4 MG tablet Commonly known as: Zofran Take 1 tablet (4 mg total) by mouth every 8 (eight) hours as needed for nausea or vomiting.   Vitamin D-3 125 MCG (5000 UT) Tabs Take by mouth daily.      Follow-up Information    Dillingham, Loel Lofty, DO In 1 week.   Specialty:  Plastic Surgery Contact information: [address] [place] [address] [phone]          Dr. Lyndee Leo [facility] Dillingham [address] [place], [place]  [address]   Signed: Threasa Heads 04/06/2019, 7:28 AM

## 2019-04-10 ENCOUNTER — Telehealth: Payer: Self-pay | Admitting: Plastic Surgery

## 2019-04-10 ENCOUNTER — Encounter: Payer: Self-pay | Admitting: *Deleted

## 2019-04-10 LAB — SURGICAL PATHOLOGY

## 2019-04-10 NOTE — Telephone Encounter (Signed)
Pt lvm requesting call back at earliest convenience. I was unsure as to whether this call needed to be from front desk or clinical staff so I called back get more info. Call went straight to vm, but unable to lvm as vm is full.

## 2019-04-11 ENCOUNTER — Encounter: Payer: Self-pay | Admitting: *Deleted

## 2019-04-12 NOTE — Progress Notes (Signed)
Subjective:     Patient ID: [name], female    DOB: October 22, 1955, 64 y.o.   MRN: [id number]  Chief Complaint  Patient presents with  . Breast Cancer    Patient here for PO follow up from [place]: 2/4 BL breast reconstruction immediate with expander and flex HD placement    HPI: The patient is a 64 y.o. female here for follow-up after right mastectomy with deep right axillary sentinel lymph node biopsy and left prophylactic mastectomy with Dr. Marlou Starks and immediate bilateral reconstruction with placement of tissue expanders and Flex HD with Dr. Marla Roe on 04/05/2019.  Today she reports overall she's doing well.  Some sharp pain in the right axilla region where lymph nodes were removed. General tenderness bilaterally. Reports overall pain is well controlled with tylenol & ibuprofen.  Bilateral incisions c/d/i. Dermabond present. No signs of infection, redness, or seroma/hematoma.  Bilateral drains in place. Mild irritation redness around drain sites. Left drain daily output < 10-15 cc serosanguinous fluid for the last several days. Right drain daily output ~ 20-35cc serosanguinous fluid for the last several days. Wearing sports bra 24/7.  Review of Systems  Constitutional: Negative for chills and fever.  HENT: Negative for congestion, rhinorrhea, sneezing and sore throat.   Respiratory: Negative for cough and shortness of breath.   Cardiovascular: Negative for chest pain.  Gastrointestinal: Negative for constipation, diarrhea, nausea and vomiting.  Skin: Negative for color change, pallor and rash.       Sharp pains in right axilla region where lymph nodes were removed. General tenderness of breasts bilaterally.      Objective:   Vital Signs BP 107/76 (BP Location: Left Arm, Patient Position: Sitting, Cuff Size: Normal)   Pulse 72   Temp 98 F (36.7 C) (Temporal)   Ht 5\' 7"  (1.702 m)   Wt 154 lb (69.9 kg)   SpO2 99%   BMI 24.12 kg/m  Vital Signs and Nursing Note Reviewed   Physical Exam  Constitutional: She is oriented to person, place, and time and well-developed, well-nourished, and in no distress.  HENT:  Head: Normocephalic and atraumatic.  Eyes: EOM are normal.  Pulmonary/Chest: Effort normal.  Bilateral incisions c/d/i. No signs of redness, infection, drainage, seroma/hematoma. Drains in place bilaterally with serosanguinous fluid in the bulb, right ~20-35 cc daily, left < 10-15 cc daily for the last several days. Tenderness upon palpation bilateral breasts.   Musculoskeletal:        General: Normal range of motion.     Cervical back: Normal range of motion.  Neurological: She is alert and oriented to person, place, and time. Gait normal.  Skin: Skin is warm and dry. No rash noted. No erythema. No pallor.  Psychiatric: Mood, memory, affect and judgment normal.      Assessment/Plan:     ICD-10-CM   1. S/P mastectomy, bilateral  Z90.13     [name] is doing very well overall.  Bilateral incisions C/D/I.  No signs of infection, redness, drainage, seroma/hematoma.  Bilateral drains in place with serosanguineous fluid in the bulb, right daily output approximately 20 to 35 cc, left daily output less than 10 to 15 cc.  Left drain removed today anticipate being able to remove right drain next week.  Mild irritation redness at drain sites.  No signs of infection.   We did not place injectable saline in the Expander using a sterile technique due to patient tenderness/comfort: Right: 0 cc for a total of 200 /300  cc Left: 0 cc for a total of 200 /300 cc  Continue wearing sports bra or binder 24/7 may use padding to make more comfortable in axilla region.  May shower avoiding direct spray on incisions and right drain site.  Do daily dressing changes on left drain site until closed, Vaseline and gauze.  Continue to avoid heavy lifting greater than 15 pounds.  Follow up 1 week. Call office with any questions/concerns.  The [place] was signed  into law in 2016 which includes the topic of electronic health records.  This provides immediate access to information in MyChart.  This includes consultation notes, operative notes, office notes, lab results and pathology reports.  If you have any questions about what you read please let us know at your next visit or call us at the office.  We are right here with you.   Threasa Heads, PA-C 04/13/2019, 12:31 PM

## 2019-04-13 ENCOUNTER — Ambulatory Visit (INDEPENDENT_AMBULATORY_CARE_PROVIDER_SITE_OTHER): Payer: BC Managed Care – PPO | Admitting: Plastic Surgery

## 2019-04-13 ENCOUNTER — Other Ambulatory Visit: Payer: Self-pay

## 2019-04-13 ENCOUNTER — Encounter: Payer: Self-pay | Admitting: Plastic Surgery

## 2019-04-13 DIAGNOSIS — Z9013 Acquired absence of bilateral breasts and nipples: Secondary | ICD-10-CM

## 2019-04-20 ENCOUNTER — Ambulatory Visit (INDEPENDENT_AMBULATORY_CARE_PROVIDER_SITE_OTHER): Payer: BC Managed Care – PPO | Admitting: Plastic Surgery

## 2019-04-20 ENCOUNTER — Encounter: Payer: Self-pay | Admitting: Plastic Surgery

## 2019-04-20 ENCOUNTER — Other Ambulatory Visit: Payer: Self-pay

## 2019-04-20 VITALS — BP 123/81 | HR 78 | Temp 97.7°F | Ht 67.0 in | Wt 156.6 lb

## 2019-04-20 DIAGNOSIS — Z9013 Acquired absence of bilateral breasts and nipples: Secondary | ICD-10-CM

## 2019-04-20 MED ORDER — DIAZEPAM 2 MG PO TABS: 2.0000 mg | ORAL_TABLET | Freq: Two times a day (BID) | ORAL | 0 refills | Status: AC | PRN

## 2019-04-20 NOTE — Progress Notes (Signed)
   Subjective:    Patient ID: [name], female    DOB: 1956-02-23, 64 y.o.   MRN: [id number]  The patient is a 64 year old female here for follow-up after undergoing a right mastectomy with sentinel lymph node biopsy and a left prophylactic mastectomy.  She had bilateral reconstruction with expander placement on February 4.  Her drain output has been minimal on the right.  Her left drain was already removed.  There is no sign of seroma or hematoma.  Her skin incisions are healing nicely.     Review of Systems  Constitutional: Negative.   HENT: Negative.   Respiratory: Negative.   Genitourinary: Negative.        Objective:   Physical Exam Vitals and nursing note reviewed.  Constitutional:      Appearance: Normal appearance.  Cardiovascular:     Rate and Rhythm: Normal rate.  Pulmonary:     Effort: Pulmonary effort is normal.  Neurological:     General: No focal deficit present.     Mental Status: She is alert and oriented to person, place, and time.  Psychiatric:        Mood and Affect: Mood normal.        Assessment & Plan:     ICD-10-CM   1. S/P mastectomy, bilateral  Z90.13     We placed injectable saline in the Expander using a sterile technique: Right: 50 cc for a total of 250 / 300 cc Left: 50 cc for a total of 250 / 300 cc  The right drain was removed.  We will plan on expansion again in 2 to 3 weeks.

## 2019-05-03 NOTE — Progress Notes (Signed)
   Subjective:     Patient ID: [name], female    DOB: 12-22-1955, 64 y.o.   MRN: [id number]  Chief Complaint  Patient presents with  . Breast Cancer    2 weeks for s/p mastectomy (B)    HPI: The patient is a 64 y.o. female here for follow-up after right mastectomy and left prophylactic mastectomy with Dr. Marlou Starks and immediate bilateral reconstruction with placement of tissue expanders and Flex HD with Dr. Marla Roe on 04/05/2019.  Overall [name] is doing well.  She reports some pain in the pec area and under arm that is worse with activity.  Reports no issues with her last fill and is interested in additional fill today.  Incisions are healing very well C/D/I.  Small amount of Dermabond remains.  Review of Systems  Constitutional: Negative for chills and fever.  HENT: Negative for congestion, rhinorrhea, sneezing and sore throat.   Respiratory: Negative for cough and shortness of breath.   Cardiovascular: Negative for chest pain.  Gastrointestinal: Negative for constipation, diarrhea, nausea and vomiting.  Skin: Negative for color change, pallor and rash.     Objective:   Vital Signs BP 128/79 (BP Location: Left Arm, Patient Position: Sitting, Cuff Size: Normal)   Pulse 79   Temp (!) 97.5 F (36.4 C) (Temporal)   Ht 5\' 7"  (1.702 m)   Wt 152 lb (68.9 kg)   SpO2 100%   BMI 23.81 kg/m  Vital Signs and Nursing Note Reviewed  Physical Exam  Constitutional: She is oriented to person, place, and time and well-developed, well-nourished, and in no distress.  HENT:  Head: Normocephalic and atraumatic.  Eyes: EOM are normal.  Cardiovascular: Normal rate.  Pulmonary/Chest: Effort normal. No respiratory distress.  Bilaterally incisions healing very well, C/D/I.  No signs of infection, drainage, redness, seroma/hematoma. Suture knots present. Some dermabond present.  Musculoskeletal:        General: Normal range of motion.     Cervical back: Normal range of motion.    Neurological: She is alert and oriented to person, place, and time. Gait normal.  Skin: Skin is warm and dry. No rash noted. No erythema. No pallor.  Psychiatric: Mood, memory, affect and judgment normal.      Assessment/Plan:     ICD-10-CM   1. S/P mastectomy, bilateral  Z90.13   2. Ductal carcinoma in situ (DCIS) of right breast  D05.11    Incisions healing well, c/d/i. Removed suture knots. Referral sent to PT. Sent Valium Rx to pharmacy.  We placed injectable saline in the Expander using a sterile technique: Right: 50 cc for a total of 300 / 300 cc Left: 60 cc for a total of 310 / 300 cc  Follow up in 2-3 weeks for additional fill, if desired.  The [place] was signed into law in 2016 which includes the topic of electronic health records.  This provides immediate access to information in MyChart.  This includes consultation notes, operative notes, office notes, lab results and pathology reports.  If you have any questions about what you read please let us know at your next visit or call us at the office.  We are right here with you.   Threasa Heads, PA-C 05/04/2019, 12:31 PM

## 2019-05-04 ENCOUNTER — Other Ambulatory Visit: Payer: Self-pay

## 2019-05-04 ENCOUNTER — Ambulatory Visit (INDEPENDENT_AMBULATORY_CARE_PROVIDER_SITE_OTHER): Payer: BC Managed Care – PPO | Admitting: Plastic Surgery

## 2019-05-04 ENCOUNTER — Encounter: Payer: Self-pay | Admitting: Plastic Surgery

## 2019-05-04 VITALS — BP 128/79 | HR 79 | Temp 97.5°F | Ht 67.0 in | Wt 152.0 lb

## 2019-05-04 DIAGNOSIS — Z9013 Acquired absence of bilateral breasts and nipples: Secondary | ICD-10-CM

## 2019-05-04 DIAGNOSIS — D0511 Intraductal carcinoma in situ of right breast: Secondary | ICD-10-CM

## 2019-05-04 MED ORDER — DIAZEPAM 2 MG PO TABS: 2.0000 mg | ORAL_TABLET | Freq: Two times a day (BID) | ORAL | 0 refills | Status: DC | PRN

## 2019-05-17 NOTE — Progress Notes (Signed)
Patient is a 64 year old female with a history of right-sided breast cancer.  She is here for follow-up after bilateral mastectomies with Dr. Marlou Starks and immediate bilateral reconstruction with placement of tissue expanders and Flex HD with Dr. Marla Roe on 04/05/2019.  Patient reports she is doing well today.  Some mild pain/tenderness of the right breast lateral side into the axilla.  Incisions are healing well, C/D/I.  No signs of infection, redness, drainage.  Right breast and axilla area have some swelling.  Patient reports she has not been wearing sports bra/binder 24/7.  Right breast has limited skin laxity.  For this reason we will do only a small fill today.  Right breast appears larger than left breast.  Patient would like to move forward with additional fill today as she is like to be a C cup if possible.  We placed injectable saline in the Expander using a sterile technique: Right: 20 cc for a total of 320 / 300 cc Left: 30 cc for a total of 340 / 300 cc  Follow-up in 2 weeks for additional fill.  Wear sports bra or binder 24/7 for 2 more weeks.  May then wear sports bra/binder just during the daytime.  May slowly begin to increase activity.  The [place] was signed into law in 2016 which includes the topic of electronic health records.  This provides immediate access to information in MyChart.  This includes consultation notes, operative notes, office notes, lab results and pathology reports.  If you have any questions about what you read please let us know at your next visit or call us at the office.  We are right here with you.

## 2019-05-18 ENCOUNTER — Other Ambulatory Visit: Payer: Self-pay

## 2019-05-18 ENCOUNTER — Encounter: Payer: Self-pay | Admitting: Plastic Surgery

## 2019-05-18 ENCOUNTER — Ambulatory Visit (INDEPENDENT_AMBULATORY_CARE_PROVIDER_SITE_OTHER): Payer: BC Managed Care – PPO | Admitting: Plastic Surgery

## 2019-05-18 VITALS — BP 126/84 | Temp 97.7°F | Ht 67.0 in | Wt 156.6 lb

## 2019-05-18 DIAGNOSIS — Z9013 Acquired absence of bilateral breasts and nipples: Secondary | ICD-10-CM

## 2019-05-18 DIAGNOSIS — D0511 Intraductal carcinoma in situ of right breast: Secondary | ICD-10-CM

## 2019-05-30 NOTE — Progress Notes (Signed)
Patient is a 64 year old female with a history of right-sided breast cancer.  She is here for follow-up after bilateral mastectomies with Dr. Marlou Starks and immediate bilateral reconstruction with placement of tissue expanders with Dr. Marla Roe on 04/05/2019.   Ms. Zell reports she is doing well today.  Denies fever, chest pain, shortness of breath, nausea/vomiting.  She reports some pain after the last fill but was well controlled with the Valium.  She is interested in additional fill today.  Right breast limited laxity.  Incisions are healing well, C/D/I.  No signs of infection, redness, drainage, seroma/hematoma.  Reports that she has not been contacted yet by physical therapy.  Recent referral today.  Sent Rx to pharmacy for Valium.  We placed injectable saline in the Expander using a sterile technique: Right: 25 cc for a total of 345 / 300 cc Left: 25 cc for a total of 365 / 300 cc  ~8weeks PO Follow-up in 2 weeks for additional fill.  Call office with any questions/concerns. The [place] was signed into law in 2016 which includes the topic of electronic health records.  This provides immediate access to information in MyChart.  This includes consultation notes, operative notes, office notes, lab results and pathology reports.  If you have any questions about what you read please let us know at your next visit or call us at the office.  We are right here with you.

## 2019-05-31 ENCOUNTER — Ambulatory Visit (INDEPENDENT_AMBULATORY_CARE_PROVIDER_SITE_OTHER): Payer: BC Managed Care – PPO | Admitting: Plastic Surgery

## 2019-05-31 ENCOUNTER — Encounter: Payer: Self-pay | Admitting: Plastic Surgery

## 2019-05-31 ENCOUNTER — Ambulatory Visit: Payer: BC Managed Care – PPO | Admitting: Surgical

## 2019-05-31 ENCOUNTER — Other Ambulatory Visit: Payer: Self-pay

## 2019-05-31 VITALS — BP 121/85 | HR 68 | Temp 97.4°F | Ht 67.0 in | Wt 152.0 lb

## 2019-05-31 DIAGNOSIS — D0511 Intraductal carcinoma in situ of right breast: Secondary | ICD-10-CM

## 2019-05-31 DIAGNOSIS — Z9013 Acquired absence of bilateral breasts and nipples: Secondary | ICD-10-CM

## 2019-05-31 MED ORDER — DIAZEPAM 2 MG PO TABS: 2.0000 mg | ORAL_TABLET | Freq: Two times a day (BID) | ORAL | 0 refills | Status: DC | PRN

## 2019-06-13 NOTE — Progress Notes (Signed)
Patient is a 63 year old female with a history of right-sided breast cancer.  She presents for follow-up after bilateral mastectomies with Dr. Marlou Starks and immediate bilateral reconstruction with placement of tissue expanders with Dr. Marla Roe on 04/05/2019.  ~ 10 weeks PO   Today she reports she is doing well overall.  Reports she still has some pain and discomfort on the right side.  She is still not heard from PT.  Incisions are healing well, C/D/I.  No signs of infection, redness, drainage, seroma/hematoma.  There is limited skin laxity on the right side.  Patient would still like to be larger and so would prefer to have some additional fills if possible.  We will hold off on fills today to allow some additional time for the tissue expander to work.  We did not place injectable saline in the Expander using a sterile technique: Right: 0 cc for a total of 345 / 300 cc Left: 0 cc for a total of 365 / 300 cc  Re-sent PT referral.  Follow-up in 2 weeks for additional fill.  Call office with any questions/concerns.  The [place] was signed into law in 2016 which includes the topic of electronic health records.  This provides immediate access to information in MyChart.  This includes consultation notes, operative notes, office notes, lab results and pathology reports.  If you have any questions about what you read please let us know at your next visit or call us at the office.  We are right here with you.

## 2019-06-14 ENCOUNTER — Ambulatory Visit (INDEPENDENT_AMBULATORY_CARE_PROVIDER_SITE_OTHER): Payer: BC Managed Care – PPO | Admitting: Plastic Surgery

## 2019-06-14 ENCOUNTER — Encounter: Payer: Self-pay | Admitting: Plastic Surgery

## 2019-06-14 ENCOUNTER — Other Ambulatory Visit: Payer: Self-pay

## 2019-06-14 VITALS — BP 120/74 | HR 73 | Temp 97.8°F | Ht 67.0 in | Wt 148.0 lb

## 2019-06-14 DIAGNOSIS — Z9013 Acquired absence of bilateral breasts and nipples: Secondary | ICD-10-CM

## 2019-06-14 DIAGNOSIS — D0511 Intraductal carcinoma in situ of right breast: Secondary | ICD-10-CM

## 2019-06-26 NOTE — Progress Notes (Signed)
Patient is a 64 year old female with a history of right-sided breast cancer.  She presents for follow-up after bilateral mastectomies with Dr. Marlou Starks and immediate first stage reconstruction with placement of tissue expanders with Dr. Marla Roe on 04/05/2019.  ~12 weeks PO. No fill at last visit due to limited skin laxity on the right.   Re-sent PT referral on 4/15.  Patient reports she is doing very well today.  No complaints.  She has been using cocoa butter on her breasts since her last visit.  Her incisions look very good today, C/D/I.  Some improvement in skin laxity on the right side.  We placed injectable saline in the Expander using a sterile technique: Right: 30 cc for a total of 375 / 300 cc Left: 30 cc for a total of 395 / 300 cc  Follow-up in 3 weeks for additional fill.  Call office with any questions/concerns.  The [place] was signed into law in 2016 which includes the topic of electronic health records.  This provides immediate access to information in MyChart.  This includes consultation notes, operative notes, office notes, lab results and pathology reports.  If you have any questions about what you read please let us know at your next visit or call us at the office.  We are right here with you.

## 2019-06-27 ENCOUNTER — Other Ambulatory Visit: Payer: Self-pay

## 2019-06-27 ENCOUNTER — Ambulatory Visit (INDEPENDENT_AMBULATORY_CARE_PROVIDER_SITE_OTHER): Payer: BC Managed Care – PPO | Admitting: Plastic Surgery

## 2019-06-27 ENCOUNTER — Encounter: Payer: Self-pay | Admitting: Plastic Surgery

## 2019-06-27 ENCOUNTER — Inpatient Hospital Stay: Payer: BC Managed Care – PPO | Attending: Oncology | Admitting: Oncology

## 2019-06-27 VITALS — BP 126/93 | HR 77 | Temp 98.7°F | Resp 18 | Ht 67.0 in | Wt 154.2 lb

## 2019-06-27 VITALS — BP 123/85 | HR 79 | Temp 97.8°F | Ht 67.0 in | Wt 148.0 lb

## 2019-06-27 DIAGNOSIS — Z9013 Acquired absence of bilateral breasts and nipples: Secondary | ICD-10-CM | POA: Insufficient documentation

## 2019-06-27 DIAGNOSIS — Z86000 Personal history of in-situ neoplasm of breast: Secondary | ICD-10-CM | POA: Diagnosis present

## 2019-06-27 DIAGNOSIS — D0511 Intraductal carcinoma in situ of right breast: Secondary | ICD-10-CM

## 2019-06-27 NOTE — Progress Notes (Deleted)
[place]  [phone] [phone]) [phone]     ID: [name] DOB: 04/01/1955  MR#: [id number]  TMA#:[id number]  Patient Care Team: Jonathon Resides, MD as PCP - General (Family Medicine) Mauro Kaufmann, RN as Oncology Nurse Navigator Rockwell Germany, RN as Oncology Nurse Navigator [name], MD as Consulting Physician (General Surgery) Martrice Apt, Virgie Dad, MD as Consulting Physician (Oncology) Eppie Gibson, MD as Attending Physician (Radiation Oncology) Juanita Craver, MD as Consulting Physician (Gastroenterology) Allyn Kenner, MD as Consulting Physician (Dermatology) Chauncey Cruel, MD OTHER MD:  CHIEF COMPLAINT: 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 She underwent right diagnostic mammography with tomography at [facility] on 01/02/2019 showing: breast density category C; 0.8 cm grouped amorphous calcifications in the upper-outer right breast; 1.6 cm grouped pleomorphic calcifications at 7 o'clock in the right breast. Right axilla ultrasound performed on 01/10/2019 at [facility] was negative for malignancy.  Accordingly on 01/10/2019 she proceeded to biopsy of the lower-outer right breast 1.6 cm area in question. The pathology from this procedure ([id number]) showed: ductal carcinoma in situ with calcifications and necrosis, high grade. Prognostic indicators significant for: estrogen receptor, 0% negative and progesterone receptor, 0% negative.  This second smaller area was normal.  Biopsy and on review it was felt to be "likely benign".  MRI was suggested for further evaluation.  The patient's subsequent history is as detailed below.   INTERVAL HISTORY: [name] was evaluated in the multidisciplinary breast cancer clinic on 01/17/2019  accompanied by her daughter [name]. Her case was also presented at the multidisciplinary breast cancer conference on the same day. 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 Danielle Dess.   REVIEW OF SYSTEMS: [name] reports wearing glasses/contacts and irregular heartbeat. There were no specific symptoms leading to the original mammogram, which was routinely scheduled. The patient denies unusual headaches, visual changes, nausea, vomiting, stiff neck, dizziness, or gait imbalance. There has been no cough, phlegm production, or pleurisy, no chest pain or pressure, and no change in bowel or bladder habits. The patient denies fever, rash, bleeding, unexplained fatigue or unexplained weight loss. A detailed review of systems was otherwise entirely negative.   PAST MEDICAL HISTORY: Past Medical History:  Diagnosis Date  . Arthritis   . Backache, unspecified   . Colon polyps   . Hyperlipidemia   . PONV (postoperative nausea and vomiting)     PAST SURGICAL HISTORY: Past Surgical History:  Procedure Laterality Date  . BREAST RECONSTRUCTION WITH PLACEMENT OF TISSUE EXPANDER AND FLEX HD (ACELLULAR HYDRATED DERMIS) Bilateral 04/05/2019   Procedure: BILATERAL BREAST RECONSTRUCTION WITH PLACEMENT OF TISSUE EXPANDER AND FLEX HD (ACELLULAR HYDRATED DERMIS);  Surgeon: Wallace Going, DO;  Location: [place];  Service: Plastics;  Laterality: Bilateral;  . ENDOMETRIAL ABLATION  1998  . KNEE SURGERY Right 2007  . MASTECTOMY W/ SENTINEL NODE BIOPSY Bilateral 04/05/2019   Procedure: BILATERAL MASTECTOMIES WITH RIGHT SENTINEL LYMPH NODE BIOPSY;  Surgeon: [name], MD;  Location: [place];  Service: General;  Laterality:  Bilateral;  . RHINOPLASTY  1977  .  TONSILLECTOMY  1961    FAMILY HISTORY: Family History  Problem Relation Age of Onset  . Arthritis Mother   . Cancer Mother        Colon   . Melanoma Father   . Cancer Maternal Grandmother        Breast   . Colon cancer Maternal Grandmother   . Cancer Maternal Grandfather        Lung (Smoker)   . Hyperlipidemia Paternal Grandmother   . Cancer Paternal Grandfather        Lung (Smoker)    Patient's parents are both currently living at age 51 (as of 12/2018). The patient reports her maternal grandmother was diagnosed with breast cancer in her late 36s or early 25s. The patient denies a family hx of ovarian, pancreatic, and prostate cancer. She does report colon cancer in her maternal grandmother (age of dx unknown), colon cancer in her mother at age 20, and melanoma in her father around age 79. She has 2 sisters, no brothers.   GYNECOLOGIC HISTORY:  No LMP recorded. Patient is postmenopausal. Menarche: 64 years old Age at first live birth: 64 years old [place] P 3 LMP in 2002 Contraceptive: used for 14-15 years with no complications HRT: used for approximately 2 years and stopped approximately 2010 Hysterectomy? no BSO? no   SOCIAL HISTORY: (updated 12/2018)  Aziyah works as a Biochemist, clinical for [employer], an Location manager. She is married. Husband [name] "Harrie Jeans" is also a Biochemist, clinical for [employer].  At home is just she and her husband.  Daughter [name], age 23, lives in [place] as a Psychologist, counselling. Daughter [name], age 76, also lives in [place] and is studying towards her MS in counseling. Daughter [name], age 41, lives in [place], [place] and is working towards her PhD in [employer] (sh[employer]).  The patient attends [employer] DIRECTIVES: In the absence of any documents to the contrary the patient's husband is her healthcare power of attorney   HEALTH MAINTENANCE: Social History   Tobacco Use  .  Smoking status: Never Smoker  . Smokeless tobacco: Never Used  Substance Use Topics  . Alcohol use: Yes    Comment: 2-3/week  . Drug use: No     Colonoscopy: 11/2017, Dr. Collene Mares, repeat in 5 years  PAP: 12/2018  Bone density: 12/2016, normal   No Known Allergies  Current Outpatient Medications  Medication Sig Dispense Refill  . Cholecalciferol (VITAMIN D-3) 125 MCG (5000 UT) TABS Take by mouth daily.    . diazepam (VALIUM) 2 MG tablet Take 1 tablet (2 mg total) by mouth every 12 (twelve) hours as needed for muscle spasms. 20 tablet 0  . famotidine (PEPCID) 40 MG tablet Take 40 mg by mouth daily.    . Multiple Vitamin (MULTI-VITAMIN DAILY PO) Take by mouth.    . omega-3 acid ethyl esters (LOVAZA) 1 g capsule Take 1 g by mouth 2 (two) times daily.     No current facility-administered medications for this visit.    OBJECTIVE: Middle-aged white woman in no acute distress  There were no vitals filed for this visit.   There is no height or weight on file to calculate BMI.   Wt Readings from Last 3 Encounters:  06/14/19 148 lb (67.1 kg)  05/31/19 152 lb (68.9 kg)  05/18/19 156 lb 9.6 oz (71 kg)      ECOG FS:1 - Symptomatic but completely ambulatory  Ocular: Sclerae unicteric,  pupils round and equal Ear-nose-throat: Wearing a mask Lymphatic: No cervical or supraclavicular adenopathy Lungs no rales or rhonchi Heart regular rate and rhythm Abd soft, nontender, positive bowel sounds MSK no focal spinal tenderness, no joint edema Neuro: non-focal, well-oriented, appropriate affect Breasts: The right breast is status post recent biopsy.  There is a mild ecchymosis.  The left breast is lumpy particularly in the upper inner quadrant, but not otherwise remarkable.  Both axillae are benign   LAB RESULTS:  CMP     Component Value Date/Time   NA 143 01/17/2019 1301   K 4.4 01/17/2019 1301   CL 103 01/17/2019 1301   CO2 28 01/17/2019 1301   GLUCOSE 93 01/17/2019 1301   BUN 15  01/17/2019 1301   CREATININE 0.91 01/17/2019 1301   CREATININE 0.66 08/28/2012 1400   CALCIUM 9.8 01/17/2019 1301   PROT 7.2 01/17/2019 1301   ALBUMIN 4.6 01/17/2019 1301   AST 22 01/17/2019 1301   ALT 17 01/17/2019 1301   ALKPHOS 69 01/17/2019 1301   BILITOT 0.9 01/17/2019 1301   GFRNONAA >60 01/17/2019 1301   GFRNONAA >89 08/28/2012 1400   GFRAA >60 01/17/2019 1301   GFRAA >89 08/28/2012 1400    No results found for: TOTALPROTELP, ALBUMINELP, A1GS, A2GS, BETS, BETA2SER, GAMS, MSPIKE, SPEI  No results found for: KPAFRELGTCHN, LAMBDASER, KAPLAMBRATIO  Lab Results  Component Value Date   WBC 8.8 01/17/2019   NEUTROABS 6.6 01/17/2019   HGB 14.3 01/17/2019   HCT 41.7 01/17/2019   MCV 101.0 (H) 01/17/2019   PLT 224 01/17/2019    No results found for: LABCA2  No components found for: [id number]  No results for input(s): INR in the last 168 hours.  No results found for: LABCA2  No results found for: RCV893  No results found for: YBO175  No results found for: ZWC585  No results found for: CA2729  No components found for: HGQUANT  No results found for: CEA1 / No results found for: CEA1   No results found for: AFPTUMOR  No results found for: CHROMOGRNA  No results found for: PSA1  No visits with results within 3 Day(s) from this visit.  Latest known visit with results is:  Admission on 04/05/2019, Discharged on 04/06/2019  Component Date Value Ref Range Status  . SURGICAL PATHOLOGY 04/05/2019    Final-Edited                   Value:SURGICAL PATHOLOGY CASE: [id number] PATIENT: Vernia Buff Surgical Pathology Report     Clinical History: right breast DCIS (cm)     FINAL MICROSCOPIC DIAGNOSIS:  A. BREAST, LEFT, MASTECTOMY: - Fibrocystic changes with focal calcifications. - No evidence of malignancy.  B. BREAST, RIGHT, MASTECTOMY: - Microinvasive ductal carcinoma, less than 0.1 cm. - High-grade ductal carcinoma in situ with necrosis and  calcifications, 2 cm. - Margins not involved. - See oncology table and comment.  C. LYMPH NODE, RIGHT #1, SENTINEL, BIOPSY: - One lymph node with no metastatic carcinoma (0/1).  D. LYMPH NODE, RIGHT, SENTINEL, BIOPSY: - One lymph node with no metastatic carcinoma (0/1).  E. LYMPH NODE, RIGHT, SENTINEL, BIOPSY: - One lymph node with no metastatic carcinoma (0/1).  F. LYMPH NODE, RIGHT, SENTINEL, BIOPSY: - One lymph node with no metastatic carcinoma (0/1).  ONCOLOGY TABLE: INVASIVE CARCINOMA OF THE BREAST:  Resection Procedure: Bilateral mast  ectomies and four right sentinel lymph nodes. Specimen Laterality: Right breast. Tumor Size: Invasive = less than 0.1 cm.  DCIS = 2 cm. Histologic Type: Ductal. Histologic Grade:      Glandular (Acinar)/Tubular Differentiation: 2      Nuclear Pleomorphism: 1      Mitotic Rate: 1      Overall Grade: 1 Ductal Carcinoma In Situ: Present) Margins: Free of invasive carcinoma.      Distance from closest margin (millimeters): 7 mm.      Specify closest margin (required only if <35m): Anterior DCIS Margins: Free of DCIS.      Distance from closest margin (millimeters): 7 mm.      Specify closest margin (required only if <171m: Anterior. Regional Lymph Nodes:      Number of Lymph Nodes Examined: 4      Number of Sentinel Nodes Examined (if applicable): 4      Number of Lymph Nodes with Macrometastases (>2 mm): 0      Number of Lymph Nodes with Micrometastases: 0      Number of Lymph Nodes with Isolated Tumor Cells (=0.2 mm or =200 cells): 0 Treatment Effect:  No known p                         resurgical therapy Breast Biomarker Testing Performed on Previous Biopsy:       Testing Performed on Case Number: [id number]           Estrogen Receptor: 0%, negative.            Progesterone Receptor: 0%, negative.            HER2: N/A.            Ki-67: N/A. Representative Tumor Block: B9. Pathologic Stage  Classification (pTNM, AJCC 8th Edition): [id number] Comments: The DCIS focally has irregular contours and immunohistochemistry for calponin, p63 and smooth muscle myosin supports a diagnosis.  Dr. BulOrlena Sheldonviewed a selected slide (B9) from this case. (v4.3.0.2)  GROSS DESCRIPTION: A: Specimen: Left breast simple mastectomy, received fresh.  The specimen is placed in formalin at 10:40 AM on 04/05/2019. Specimen integrity (intact/disrupted): Intact Weight: 292 g Size: 17.5 x 13.5 x 4 cm Skin: There is an attached 9 x 4 cm ellipse of skin with a central unremarkable nipple.  There is a suture attached identifying the lateral edge. Tumor/cavity: There is no tum                         or present. Uninvolved parenchyma: The breast tissue consists of soft yellow adipose tissue and focally dense white fibrous tissue. Prognostic indicators: Obtained from paraffin blocks if needed Lymph nodes: There are no lymph nodes present. Block summary: 9 blocks submitted 1 = nipple 2, 3 = upper lateral 4, 5 = lower lateral 6, 7 = upper medial 8, 9 = lower medial B: Specimen: Right simple mastectomy, received fresh.  The specimen is placed in formalin at 10:40 AM on 04/05/2019. Specimen integrity (intact/disrupted): Intact Weight: 291 g Size: 17.5 x 15 x 4.5 cm Skin: There is an attached 8.5 x 5 cm ellipse of skin with a central unremarkable nipple.  There is a suture attached identifying the lateral edge. Tumor/cavity: In the lower mid-lateral breast there is a stellate area of hemorrhage and fat necrosis measuring 2 cm in greatest dimension. There are 3 biopsy clips present in this region, 2 cylindrical  clips and a barbell-shaped clip.  A discrete mass is not                          grossly identified.  The biopsy site is located 1 cm from the deep margin extends to within 0.7 cm of the anterior margin just inferior to the skin. Uninvolved parenchyma: The remainder the breast tissue consists  of soft yellow adipose tissue and focally dense white fibrous tissue. Prognostic indicators: Obtained from paraffin blocks if needed Lymph nodes: There are no lymph nodes present. Block summary: 14 blocks submitted 1 = nipple 2 = deep margin at biopsy site 3-10 = entire biopsy site with some sections to include the anterior margin (green ink) 11 = upper lateral 12 = lower lateral 13 = upper medial 14 = lower medial C: Received fresh is a 0.8 x 0.7 x 0.6 cm rubbery tan-pink nodule.  The specimen is bisected and entirely submitted in 1 cassette. D: Received fresh is a 0.6 x 0.5 x 0.5 cm rubbery tan-pink nodule.  The specimen is bisected and entirely submitted in 1 cassette. D: Received fresh is a 1.0 x 0.7 x 0.6 cm rubbery tan-pink nodule.  The specimen is                          bisected and entirely submitted in 1 cassette. F: Received fresh is a 1.6 x 1.0 x 0.7 cm rubbery tan-pink nodule.  The specimen is sectioned and entirely submitted in 1 cassette.  [facility] 04/06/2019)  Final Diagnosis performed by Claudette Laws, MD.   Electronically signed 04/10/2019 Technical component performed at [facility]. [facility], [place] [address], [place], [place] [address].  Professional component performed at [facility], [place] [address] [place], [place] [address].  Immunohistochemistry Technical component (if applicable) was performed at [facility] [address] [place], [place], [place] [address].   IMMUNOHISTOCHEMISTRY DISCLAIMER (if applicable): Some of these immunohistochemical stains may have been developed and the performance characteristics determine by [facility]. Some may not have been cleared or approved by the U.S. Food and Drug Administration. The FDA has determined that such clearance or app                         roval is not necessary. This test is used for clinical purposes. It should not be regarded as investigational  or for research. This laboratory is certified under the [place] (CLIA-88) as qualified to perform high complexity clinical laboratory testing.  The controls stained appropriately.     (this displays the last labs from the last 3 days)  No results found for: TOTALPROTELP, ALBUMINELP, A1GS, A2GS, BETS, BETA2SER, GAMS, MSPIKE, SPEI (this displays SPEP labs)  No results found for: KPAFRELGTCHN, LAMBDASER, KAPLAMBRATIO (kappa/lambda light chains)  No results found for: HGBA, HGBA2QUANT, HGBFQUANT, HGBSQUAN (Hemoglobinopathy evaluation)   No results found for: LDH  No results found for: IRON, TIBC, IRONPCTSAT (Iron and TIBC)  No results found for: FERRITIN  Urinalysis    Component Value Date/Time   BILIRUBINUR Neg 10/09/2012 1206   PROTEINUR Neg 10/09/2012 1206   UROBILINOGEN negative 10/09/2012 1206   NITRITE Neg 10/09/2012 1206   LEUKOCYTESUR Negative 10/09/2012 1206     STUDIES: No results found.  ELIGIBLE FOR AVAILABLE RESEARCH PROTOCOL: no  ASSESSMENT: 64 y.o. [name] woman status post  right breast biopsy 01/10/2019 for a clinically 1.6 cm ductal carcinoma in situ, high-grade, estrogen and progesterone receptor negative  (a) a second, smaller area of suspicious calcifications could not be safely biopsied  (1) definitive surgery pending  (2) adjuvant radiation if breast conservation  (3) no role for antiestrogens except but possibly for prophylaxis  PLAN: I spent approximately 60 minutes face to face with Georga with more than 50% of that time spent in counseling and coordination of care. Specifically we reviewed the biology of the patient's diagnosis and the specifics of her situation.  Amandalee understands that in noninvasive ductal carcinoma, also called ductal carcinoma in situ ("DCIS") the breast cancer cells remain trapped in the ducts were they started. They cannot travel to a vital organ. For that reason these  cancers in themselves are not life-threatening.  If the whole breast is removed then all the ducts are removed and since the cancer cells are trapped in the ducts, the cure rate with mastectomy for noninvasive breast cancer is approximately 99%. Nevertheless we usually recommend lumpectomy, because there is no survival advantage to mastectomy and because the cosmetic result is generally superior with breast conservation.  However in Shloka's case, with the second area that may need to be removed, it may be that for cosmesis reasons a mastectomy will be necessary  If the patient does keep her breast, there will be some risk of recurrence. The recurrence can only be in the same breast since, again, the cells are trapped in the ducts. There is no connection from one breast to the other. The risk of local recurrence is cut by more than half with radiation, which is standard in this situation.  In estrogen receptor positive cancers anti-estrogens can also be considered.  That is not the case here however.  The patient also understands that we never use chemotherapy and noninvasive breast cancer.  Accordingly the overall plan is for surgery, followed by radiation, then observation.  I plan to see her at least 1 more time sometime in May 2021, to clear out any residual questions she may also discussed the adjustments that even patients with noninvasive disease experience following the diagnosis and treatment of breast cancer.  Shahana has a good understanding of the overall plan. She agrees with it. She knows the goal of treatment in her case is cure. She will call with any problems that may develop before her next visit here.   Chauncey Cruel, MD   06/27/2019 9:46 AM Medical Oncology and Hematology [facility] [place], [place] [address] Tel. [phone]    Fax. [phone]   This document serves as a record of services personally performed by Lurline Del, MD. It  was created on his behalf by Wilburn Mylar, a trained medical scribe. The creation of this record is based on the scribe's personal observations and the provider's statements to them.   I, Lurline Del MD, have reviewed the above documentation for accuracy and completeness, and I agree with the above.

## 2019-06-27 NOTE — Progress Notes (Signed)
[facility]  [phone] [phone]     ID: [name] DOB: [birth year]  MR#: [id number]  [id number]  Patient Care Team: Jonathon Resides, MD as PCP - General (Family Medicine) Mauro Kaufmann, RN as Oncology Nurse Navigator Rockwell Germany, RN as Oncology Nurse Navigator [name], MD as Consulting Physician (General Surgery) Zakira Ressel, Virgie Dad, MD as Consulting Physician (Oncology) Eppie Gibson, MD as Attending Physician (Radiation Oncology) Juanita Craver, MD as Consulting Physician (Gastroenterology) Allyn Kenner, MD as Consulting Physician (Dermatology) Chauncey Cruel, MD OTHER MD:  CHIEF COMPLAINT: 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 She was evaluated in the multidisciplinary breast cancer clinic on 01/17/2019.  Since consultation, she underwent breast MRI on 01/26/2019 showing: breast composition C; suspicious non-mass enhancement extends anteriorly from biopsy site towards nipple by approximately 3 cm; suspicious enhancing masses/foci extending posteriorly from biopsy site toward chest wall, including an 8 mm mass and additional 4-5 mm foci abutting chest wall; indeterminate 5 mm mass in upper-outer left breast; no suspicious lymphadenopathy.  She was scheduled for biopsy of the suspicious breast areas on 02/02/2019, but she ate prior to her scan and also moved during the scan. As such, this was rescheduled.  She tested positive for Covid-19 on 02/05/2019.  She returned for biopsies of the suspicious right breast areas on 02/21/2019. The left breast mass seen on prior MRI was no longer visible. 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 Prognostic indicators  significant for: estrogen receptor 0% negative, and progesterone receptor 0% negative.  She opted to proceed with bilateral mastectomies and right sentinel lymph node biopsy on 04/05/2019 under Dr. Marlou Starks, with immediate reconstruction performed by Dr. Marla Roe. Pathology from the procedure [id number]) showed: 1. 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 Right Axillary Lymph Nodes, 4             - negative for metastatic carcinoma (0/4)  The left breast was negative for malignancy, showing fibrocystic changes with focal calcifications.   REVIEW OF SYSTEMS: [name] did well with her mastectomies.  She did not have significant issues with bleeding, fever, or pain.  She is encouraged by the initial appearance of the reconstruction although she feels the breasts seem to be very separate.  She has been reassured by Dr. Marla Roe regarding this.  Currently [name] has no specific activity restrictions and she is looking to start an exercise program.  A detailed review of systems today was otherwise stable   HISTORY OF CURRENT ILLNESS: From the original intake note:  "[name]" had routine screening mammography on 12/22/2018 at [facility] showing two groups of calcifications in the right breast. She underwent right diagnostic mammography with tomography at [facility] on 01/02/2019 showing: breast density category C; 0.8 cm grouped amorphous calcifications in the upper-outer right breast; 1.6 cm grouped pleomorphic calcifications at 7 o'clock in the right breast. Right axilla ultrasound performed on 01/10/2019 at [facility] was negative for malignancy.  Accordingly on 01/10/2019  she proceeded to biopsy of the lower-outer right breast 1.6 cm area in question. The pathology from this procedure [id number]) showed: ductal carcinoma in situ with  calcifications and necrosis, high grade. Prognostic indicators significant for: estrogen receptor, 0% negative and progesterone receptor, 0% negative.  This second smaller area was normal.  Biopsy and on review it was felt to be "likely benign".  MRI was suggested for further evaluation.  The patient's subsequent history is as detailed below.   PAST MEDICAL HISTORY: Past Medical History:  Diagnosis Date  . Arthritis   . Backache, unspecified   . Colon polyps   . Hyperlipidemia   . PONV (postoperative nausea and vomiting)     PAST SURGICAL HISTORY: Past Surgical History:  Procedure Laterality Date  . BREAST RECONSTRUCTION WITH PLACEMENT OF TISSUE EXPANDER AND FLEX HD (ACELLULAR HYDRATED DERMIS) Bilateral 04/05/2019   Procedure: BILATERAL BREAST RECONSTRUCTION WITH PLACEMENT OF TISSUE EXPANDER AND FLEX HD (ACELLULAR HYDRATED DERMIS);  Surgeon: Wallace Going, DO;  Location: [facility];  Service: Plastics;  Laterality: Bilateral;  . ENDOMETRIAL ABLATION  1998  . KNEE SURGERY Right 2007  . MASTECTOMY W/ SENTINEL NODE BIOPSY Bilateral 04/05/2019   Procedure: BILATERAL MASTECTOMIES WITH RIGHT SENTINEL LYMPH NODE BIOPSY;  Surgeon: [name], MD;  Location: [facility];  Service: General;  Laterality: Bilateral;  . RHINOPLASTY  1977  . 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 Danielle Dess.   FAMILY HISTORY: Family History  Problem Relation Age of Onset  . Arthritis Mother   . Cancer Mother        Colon   . Melanoma Father   . Cancer Maternal Grandmother        Breast   . Colon cancer Maternal Grandmother   . Cancer Maternal Grandfather        Lung (Smoker)   . Hyperlipidemia Paternal Grandmother   . Cancer Paternal Grandfather        Lung (Smoker)    Patient's parents are both age 62 (as of 12/2018). The patient reports her maternal grandmother was diagnosed with breast cancer in her late 24s  or early 62s. The patient denies a family hx of ovarian, pancreatic, and prostate cancer. She does report colon cancer in her maternal grandmother (age of dx unknown), colon cancer in her mother at age 57, and melanoma in her father around age 25. She has 2 sisters, no brothers.   GYNECOLOGIC HISTORY:  No LMP recorded. Patient is postmenopausal. Menarche: 64 years old Age at first live birth: 64 years old [place] P 3 LMP in 2002 Contraceptive: used for 14-15 years with no complications HRT: used for approximately 2 years and stopped approximately 2010 Hysterectomy? no BSO? no   SOCIAL HISTORY: (updated 12/2018)  [name] works as a Biochemist, clinical for [employer], an Location manager.  Husband Eduard Clos "Harrie Jeans" is also a Biochemist, clinical for [employer].  At home is just she and her husband.  Daughter [name], age 63, lives in [place] and works as a Psychologist, counselling. Daughter [name], age 28, also lives in [place] and is studying towards her MS in counseling. Daughter [name], age 26, lives in [place], [place] and is working towards her PhD in [employer] ([employer]).  The patient attends [employer] DIRECTIVES: In the absence of any documents to the contrary the patient's husband is her healthcare power of attorney  HEALTH MAINTENANCE: Social History   Tobacco Use  . Smoking status: Never Smoker  . Smokeless tobacco: Never Used  Substance Use Topics  . Alcohol use: Yes    Comment: 2-3/week  . Drug use: No     Colonoscopy: 11/2017, Dr. Collene Mares, repeat in 5 years  PAP: 12/2018  Bone density: 12/2016, normal   No Known Allergies  Current Outpatient Medications  Medication Sig Dispense Refill  . Cholecalciferol (VITAMIN D-3) 125 MCG (5000 UT) TABS Take by mouth daily.    . diazepam (VALIUM) 2 MG tablet Take 1 tablet (2 mg total) by mouth every 12 (twelve) hours as needed for muscle spasms. 20 tablet 0  . famotidine (PEPCID) 40 MG tablet Take 40 mg by mouth  daily.    . Multiple Vitamin (MULTI-VITAMIN DAILY PO) Take by mouth.    . omega-3 acid ethyl esters (LOVAZA) 1 g capsule Take 1 g by mouth 2 (two) times daily.     No current facility-administered medications for this visit.    OBJECTIVE:  white woman who appears younger than stated age  48:   06/27/19 1414  BP: (!) 126/93  Pulse: 77  Resp: 18  Temp: 98.7 F (37.1 C)  SpO2: 100%     Body mass index is 24.15 kg/m.   Wt Readings from Last 3 Encounters:  06/27/19 154 lb 3.2 oz (69.9 kg)  06/27/19 148 lb (67.1 kg)  06/14/19 148 lb (67.1 kg)      ECOG FS:1 - Symptomatic but completely ambulatory  Sclerae unicteric, EOMs intact Wearing a mask No cervical or supraclavicular adenopathy Lungs no rales or rhonchi Heart regular rate and rhythm Abd soft, nontender, positive bowel sounds MSK no focal spinal tenderness, no upper extremity lymphedema Neuro: nonfocal, well oriented, appropriate affect Breasts: Status post bilateral mastectomies with bilateral expanders in place.  There is no dehiscence, erythema or swelling.  The initial cosmetic appearance is symmetrical.  Both axillae are benign.   LAB RESULTS:  CMP     Component Value Date/Time   NA 143 01/17/2019 1301   K 4.4 01/17/2019 1301   CL 103 01/17/2019 1301   CO2 28 01/17/2019 1301   GLUCOSE 93 01/17/2019 1301   BUN 15 01/17/2019 1301   CREATININE 0.91 01/17/2019 1301   CREATININE 0.66 08/28/2012 1400   CALCIUM 9.8 01/17/2019 1301   PROT 7.2 01/17/2019 1301   ALBUMIN 4.6 01/17/2019 1301   AST 22 01/17/2019 1301   ALT 17 01/17/2019 1301   ALKPHOS 69 01/17/2019 1301   BILITOT 0.9 01/17/2019 1301   GFRNONAA >60 01/17/2019 1301   GFRNONAA >89 08/28/2012 1400   GFRAA >60 01/17/2019 1301   GFRAA >89 08/28/2012 1400    No results found for: TOTALPROTELP, ALBUMINELP, A1GS, A2GS, BETS, BETA2SER, GAMS, MSPIKE, SPEI  No results found for: KPAFRELGTCHN, LAMBDASER, KAPLAMBRATIO  Lab Results  Component Value  Date   WBC 8.8 01/17/2019   NEUTROABS 6.6 01/17/2019   HGB 14.3 01/17/2019   HCT 41.7 01/17/2019   MCV 101.0 (H) 01/17/2019   PLT 224 01/17/2019    No results found for: LABCA2  No components found for: [id number]  No results for input(s): INR in the last 168 hours.  No results found for: LABCA2  No results found for: [id number]  No results found for: [id number]  No results found for: [id number]  No results found for: CA2729  No components found for: HGQUANT  No results found for: CEA1 / No results found for: CEA1  No results found for: AFPTUMOR  No results found for: CHROMOGRNA  No results found for: PSA1    (this displays the last labs from the last 3 days)  No results found for: TOTALPROTELP, ALBUMINELP, A1GS, A2GS, BETS, BETA2SER, GAMS, MSPIKE, SPEI (this displays SPEP labs)  No results found for: KPAFRELGTCHN, LAMBDASER, KAPLAMBRATIO (kappa/lambda light chains)  No results found for: HGBA, HGBA2QUANT, HGBFQUANT, HGBSQUAN (Hemoglobinopathy evaluation)   No results found for: LDH  No results found for: IRON, TIBC, IRONPCTSAT (Iron and TIBC)  No results found for: FERRITIN  Urinalysis    Component Value Date/Time   BILIRUBINUR Neg 10/09/2012 1206   PROTEINUR Neg 10/09/2012 1206   UROBILINOGEN negative 10/09/2012 1206   NITRITE Neg 10/09/2012 1206   LEUKOCYTESUR Negative 10/09/2012 1206    STUDIES: No results found.   ELIGIBLE FOR AVAILABLE RESEARCH PROTOCOL: no  ASSESSMENT: 64 y.o. [place] woman status post right breast biopsy 01/10/2019 for a clinically 1.6 cm ductal carcinoma in situ, high-grade, estrogen and progesterone receptor negative  (a) a second, smaller area of suspicious calcifications could not be safely biopsied  (1) status post bilateral mastectomies and right sentinel lymph node sampling 04/05/2019, showing:  (a) left breast, no evidence of malignancy  (b) right breast, a microinvasive ductal carcinoma measuring less than 0.1 cm,  grade 1, in the setting of DCIS, with negative margins: pT1(mic) pN0, pTis   (i) a total of 4 right axillary lymph nodes were removed, all benign  (2) breast reconstruction in progress   PLAN: Jaela did very well with her surgery.  We reviewed the pathology.  She understands that the cure of noninvasive breast cancer with mastectomy approaches 100%.  She had a 1 mm area of invasive disease.  The chance of this already having spread outside the breast in her case is vanishingly small probably in the 1 or 2% range at the most.  Accordingly she is done with this breast cancer.  She does not need antiestrogens or radiation.  She had many questions regarding reconstruction and she continues to work closely with Dr. Marla Roe regarding that.  She is aware of the fact that on the right side she had 4 lymph nodes removed.  This puts her at some risk of lymphedema there.  She should use the left arm for blood pressure and blood draws if available and she can consider obtaining a compression sleeve if she is planning very long flights for example to the [place].  At this point I am comfortable releasing her from follow-up.  All she will need in terms of breast cancer screening in the future is a physician physical exam of the reconstructed breasts and chest wall.  I will be glad to see her again at any point in the future if and when the need arises but as of now are making no further routine appointments for her here.  Total encounter time 25 minutes.Chauncey Cruel, MD   06/27/2019 4:56 PM Medical Oncology and Hematology [facility] [place], [place] [address] Tel. [phone]    Fax. [phone]   This document serves as a record of services personally performed by Lurline Del, MD. It was created on his behalf by Wilburn Mylar, a trained medical scribe. The creation of this record is based on the scribe's personal observations and the provider's  statements to them.   I, Lurline Del MD, have reviewed the above documentation for accuracy and completeness, and I agree with the  above.   *Total Encounter Time as defined by the Centers for Medicare and Medicaid Services includes, in addition to the face-to-face time of a patient visit (documented in the note above) non-face-to-face time: obtaining and reviewing outside history, ordering and reviewing medications, tests or procedures, care coordination (communications with other health care professionals or caregivers) and documentation in the medical record.

## 2019-06-28 ENCOUNTER — Telehealth: Payer: Self-pay | Admitting: Oncology

## 2019-06-28 ENCOUNTER — Ambulatory Visit: Payer: BC Managed Care – PPO | Admitting: Plastic Surgery

## 2019-06-28 NOTE — Telephone Encounter (Signed)
No 4/28 los. No changes made to pt's schedule.

## 2019-06-29 ENCOUNTER — Encounter: Payer: Self-pay | Admitting: Rehabilitation

## 2019-06-29 ENCOUNTER — Ambulatory Visit: Payer: BC Managed Care – PPO | Attending: Plastic Surgery | Admitting: Rehabilitation

## 2019-06-29 ENCOUNTER — Other Ambulatory Visit: Payer: Self-pay

## 2019-06-29 DIAGNOSIS — M25611 Stiffness of right shoulder, not elsewhere classified: Secondary | ICD-10-CM

## 2019-06-29 DIAGNOSIS — M25612 Stiffness of left shoulder, not elsewhere classified: Secondary | ICD-10-CM | POA: Insufficient documentation

## 2019-06-29 DIAGNOSIS — Z483 Aftercare following surgery for neoplasm: Secondary | ICD-10-CM | POA: Insufficient documentation

## 2019-06-29 NOTE — Therapy (Signed)
[place], [place], [address] Phone: [phone]   Fax:  [phone]  Physical Therapy Evaluation  Patient Details  Name: [name] MRN: [id number] Date of Birth: Apr 08, 1955 Referring Provider (PT): Phoebe Sharps PA-C   Encounter Date: 06/29/2019  PT End of Session - 06/29/19 0958    Visit Number  1    Number of Visits  9    Date for PT Re-Evaluation  07/27/19    PT Start Time  0900    PT Stop Time  0945    PT Time Calculation (min)  45 min    Activity Tolerance  Patient tolerated treatment well    Behavior During Therapy  [facility] for tasks assessed/performed       Past Medical History:  Diagnosis Date  . Arthritis   . Backache, unspecified   . Colon polyps   . Hyperlipidemia   . PONV (postoperative nausea and vomiting)     Past Surgical History:  Procedure Laterality Date  . BREAST RECONSTRUCTION WITH PLACEMENT OF TISSUE EXPANDER AND FLEX HD (ACELLULAR HYDRATED DERMIS) Bilateral 04/05/2019   Procedure: BILATERAL BREAST RECONSTRUCTION WITH PLACEMENT OF TISSUE EXPANDER AND FLEX HD (ACELLULAR HYDRATED DERMIS);  Surgeon: Wallace Going, DO;  Location: [facility];  Service: Plastics;  Laterality: Bilateral;  . ENDOMETRIAL ABLATION  1998  . KNEE SURGERY Right 2007  . MASTECTOMY W/ SENTINEL NODE BIOPSY Bilateral 04/05/2019   Procedure: BILATERAL MASTECTOMIES WITH RIGHT SENTINEL LYMPH NODE BIOPSY;  Surgeon: [name], MD;  Location: [facility];  Service: General;  Laterality: Bilateral;  . RHINOPLASTY  1977  . TONSILLECTOMY  1961    There were no vitals filed for this visit.   Subjective Assessment - 06/29/19 0902    Subjective  I can still feel some range of motion issues.  reaching the top shelf. Rt side > Lt.  Still getting fills in the implants.    Pertinent History  bilateral mastectomy and immediate reconstruction on 04/05/19 by. Dr. Marlou Starks and  Dillingham.  4 lymph nodes removed all negative.  Rt. DCIS.  No radiation or Chemotherapy. No other health problems. Implant switch not yet scheduled.    Limitations  Lifting    Patient Stated Goals  get my ROM back    Currently in Pain?  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   Pain Score  2    at the worst   Pain Location  Chest    Pain Orientation  Right;Left    Pain Descriptors / Indicators  Aching    Pain Type  Surgical pain    Pain Onset  More than a month ago    Pain Frequency  Intermittent    Aggravating Factors   fills, driving all day         [facility] PT Assessment - 06/29/19 0001      Assessment   Medical Diagnosis  Rt breast cancer    Referring Provider (PT)  Phoebe Sharps PA-C    Onset Date/Surgical Date  04/05/19    Hand Dominance  Right    Prior Therapy  no      Precautions   Precaution Comments  lymphedema      Balance Screen   Has the patient fallen in the past 6 months  No    Has the patient had a decrease in activity level because of a fear  of falling?   No    Is the patient reluctant to leave their home because of a fear of falling?   No      Home Film/video editor residence    Living Arrangements  Spouse/significant other      Prior Function   Level of Independence  Independent    Vocation  Full time employment    Programme researcher, broadcasting/film/video rep for eyewear    Leisure  walking, I want to get back to arm weights       Cognition   Overall Cognitive Status  Within Functional Limits for tasks assessed      Observation/Other Assessments   Observations  I started wearing my sports bra more recently and it helps the Rt side;   Bil healed incisions over the expanders mid breast  Rt expander sitting higher and more lateral than the left and Rt lateral edge sharp and poking patient which Dr. Marla Roe is aware      Sensation   Additional Comments  mild numbness in the Rt axilla but I don't notice it        Coordination   Gross Motor Movements are Fluid and Coordinated  Yes      Posture/Postural Control   Posture/Postural Control  Postural limitations    Postural Limitations  Rounded Shoulders;Forward head      ROM / Strength   AROM / PROM / Strength  AROM;PROM      AROM   Overall AROM Comments  no pain just pulling in the pectoralis anterior shoulder region end range    AROM Assessment Site  Shoulder    Right/Left Shoulder  Right;Left    Right Shoulder Flexion  138 Degrees    Right Shoulder ABduction  165 Degrees    Right Shoulder Internal Rotation  80 Degrees    Right Shoulder External Rotation  80 Degrees    Right Shoulder Horizontal ABduction  30 Degrees    Left Shoulder Flexion  140 Degrees    Left Shoulder ABduction  162 Degrees    Left Shoulder Internal Rotation  80 Degrees    Left Shoulder External Rotation  85 Degrees    Left Shoulder Horizontal ABduction  26 Degrees      PROM   PROM Assessment Site  Shoulder    Right/Left Shoulder  Right;Left    Right Shoulder Flexion  116 Degrees    Right Shoulder ABduction  115 Degrees    Right Shoulder External Rotation  80 Degrees    Left Shoulder Flexion  150 Degrees    Left Shoulder ABduction  162 Degrees    Left Shoulder External Rotation  90 Degrees        LYMPHEDEMA/ONCOLOGY QUESTIONNAIRE - 06/29/19 0927      Type   Cancer Type  Rt DCIS      Surgeries   Mastectomy Date  04/05/19    Sentinel Lymph Node Biopsy Date  04/05/19    Number Lymph Nodes Removed  4      Treatment   Active Chemotherapy Treatment  No    Past Chemotherapy Treatment  No    Active Radiation Treatment  No    Past Radiation Treatment  No    Current Hormone Treatment  No    Past Hormone Therapy  No      What other symptoms do you have   Are you Having Heaviness or Tightness  Yes      Lymphedema Assessments  Lymphedema Assessments  Upper extremities      Right Upper Extremity Lymphedema   10 cm Proximal to Olecranon Process  25.5 cm     Olecranon Process  24 cm    10 cm Proximal to Ulnar Styloid Process  19.5 cm    Just Proximal to Ulnar Styloid Process  16 cm    Across Hand at [employer]  19.5 cm    At [place] of 2nd Digit  5.8 cm      Left Upper Extremity Lymphedema   10 cm Proximal to Olecranon Process  26.3 cm    Olecranon Process  24 cm    10 cm Proximal to Ulnar Styloid Process  18.2 cm    Just Proximal to Ulnar Styloid Process  15.8 cm    Across Hand at [employer]  20.5 cm    At [place] of 2nd Digit  6 cm          Quick Dash - 06/29/19 0001    Open a tight or new jar  Unable    Do heavy household chores (wash walls, wash floors)  Moderate difficulty    Carry a shopping bag or briefcase  Mild difficulty    Wash your back  Mild difficulty    Use a knife to cut food  No difficulty    Recreational activities in which you take some force or impact through your arm, shoulder, or hand (golf, hammering, tennis)  Moderate difficulty    During the past week, to what extent has your arm, shoulder or hand problem interfered with your normal social activities with family, friends, neighbors, or groups?  Not at all    During the past week, to what extent has your arm, shoulder or hand problem limited your work or other regular daily activities  Modererately    Arm, shoulder, or hand pain.  Mild    Tingling (pins and needles) in your arm, shoulder, or hand  None    Difficulty Sleeping  No difficulty    DASH Score  29.55 %        Objective measurements completed on examination: See above findings.      [facility] Adult PT Treatment/Exercise - 06/29/19 0001      Exercises   Exercises  Shoulder      Shoulder Exercises: Stretch   Corner Stretch  1 rep;20 seconds    Corner Stretch Limitations  single arm doorway stretch changing from stepping through the doorway to taking steps away     External Rotation Stretch  1 rep;20 seconds   supine behind the head   Other Shoulder Stretches  supine LTR with goalpost  arms 5" x 5 bil    Other Shoulder Stretches  seated mermaid stretch 5" x 3 bil              PT Education - 06/29/19 0956    Education Details  Turning point stretches handout indicating: mermaid stretch, LTR with goalpost arms. single arm pectoralis stretch, wall walking, and butterfly stretch added on, lymphedema risk reduction and getting a sleeve    Person(s) Educated  Patient    Methods  Explanation;Demonstration;Tactile cues;Verbal cues;Handout    Comprehension  Verbalized understanding;Returned demonstration;Verbal cues required;Tactile cues required          PT Long Term Goals - 06/29/19 1003      PT LONG TERM GOAL #1   Title  Pt will improve shoulder AROM to allow for no pulling in the  axilla when reaching into a cupboard    Time  4    Period  Weeks    Status  New      PT LONG TERM GOAL #2   Title  Pt will improve Rt PROM into flexion to at least 150degrees without axillary discomfort    Baseline  116    Time  4    Period  Weeks    Status  New      PT LONG TERM GOAL #3   Title  Pt will be ind with strength ABC program and education on return to weight lifting    Time  4    Period  Weeks    Status  New      PT LONG TERM GOAL #4   Title  Pt will be educated on lymphedema and risk reduction practices    Time  4    Period  Weeks    Status  Achieved             Plan - 06/29/19 6945    Clinical Impression Statement  Pt presents almost 8 weeks post bilateral mastectomy and expander placement with lymph node removal x 4 on the Rt with decreased shoulder ROM bilaterally.  AROM is slightly more limited on the left but PROM is more limited on the Rt with pectoralis and axillary tightness evident with flexion and abduction.  No pain overall unless getting sore from driving all day.  No evidence of lymphedema but pt was educated on risk reduction and pt may get a sleeve now for flying to [place] as her deductible has been met.  Pt is ready to maximize shoulder  AROM bilaterally and transition to home strength activities.    Personal Factors and Comorbidities  Comorbidity 1    Comorbidities  SLNB    Examination-Activity Limitations  Lift    Examination-Participation Restrictions  Cleaning    Stability/Clinical Decision Making  Stable/Uncomplicated    Clinical Decision Making  Low    Rehab Potential  Excellent    PT Frequency  2x / week    PT Duration  4 weeks    PT Treatment/Interventions  ADLs/Self Care Home Management;Therapeutic activities;Therapeutic exercise;Patient/family education;Manual techniques;Manual lymph drainage;Scar mobilization;Passive range of motion    PT Next Visit Plan  give signed script from Dr. Jana Hakim for sleeve, any questions about stretches?, bil PROM AAROM activities, manual release to the Rt axillary region, eventually strength ABC    PT Home Exercise Plan  Turning point stretches handout indicating: mermaid stretch, LTR with goalpost arms. single arm pectoralis stretch, wall walking, and butterfly stretch added    Consulted and Agree with Plan of Care  Patient       Patient will benefit from skilled therapeutic intervention in order to improve the following deficits and impairments:  Postural dysfunction, Decreased activity tolerance, Decreased range of motion, Impaired UE functional use  Visit Diagnosis: Aftercare following surgery for neoplasm  Stiffness of left shoulder, not elsewhere classified  Stiffness of right shoulder, not elsewhere classified     Problem List Patient Active Problem List   Diagnosis Date Noted  . S/P mastectomy, bilateral 04/13/2019  . 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  . Weight gain 09/09/2013  . Routine general medical examination at a health care facility 11/25/2012  . Screening for malignant neoplasm of the cervix 11/25/2012  . Menopause 10/08/2012  . Unspecified vitamin D deficiency 10/08/2012  . Hyperlipemia 10/08/2012  . Osteoarthritis 06/04/2012  Stark Bray 06/29/2019, 10:07 AM  [place], [place], [address] Phone: [phone]   Fax:  [phone]  Name: [name] MRN: [id number] Date of Birth: [birth year]

## 2019-07-06 ENCOUNTER — Other Ambulatory Visit: Payer: Self-pay

## 2019-07-06 ENCOUNTER — Encounter: Payer: Self-pay | Admitting: Rehabilitation

## 2019-07-06 ENCOUNTER — Ambulatory Visit: Payer: BC Managed Care – PPO | Attending: Plastic Surgery | Admitting: Rehabilitation

## 2019-07-06 DIAGNOSIS — M25611 Stiffness of right shoulder, not elsewhere classified: Secondary | ICD-10-CM | POA: Diagnosis present

## 2019-07-06 DIAGNOSIS — M25612 Stiffness of left shoulder, not elsewhere classified: Secondary | ICD-10-CM | POA: Insufficient documentation

## 2019-07-06 DIAGNOSIS — Z483 Aftercare following surgery for neoplasm: Secondary | ICD-10-CM | POA: Insufficient documentation

## 2019-07-06 NOTE — Therapy (Signed)
[place], [place], [address] Phone: [phone]   Fax:  [phone]  Physical Therapy Treatment  Patient Details  Name: [name] MRN: [id number] Date of Birth: Feb 22, 1956 Referring Provider (PT): Phoebe Sharps PA-C   Encounter Date: 07/06/2019  PT End of Session - 07/06/19 1219    Visit Number  2    Number of Visits  9    Date for PT Re-Evaluation  07/27/19    PT Start Time  1102    PT Stop Time  1152    PT Time Calculation (min)  50 min    Activity Tolerance  Patient tolerated treatment well    Behavior During Therapy  [facility] for tasks assessed/performed       Past Medical History:  Diagnosis Date  . Arthritis   . Backache, unspecified   . Colon polyps   . Hyperlipidemia   . PONV (postoperative nausea and vomiting)     Past Surgical History:  Procedure Laterality Date  . BREAST RECONSTRUCTION WITH PLACEMENT OF TISSUE EXPANDER AND FLEX HD (ACELLULAR HYDRATED DERMIS) Bilateral 04/05/2019   Procedure: BILATERAL BREAST RECONSTRUCTION WITH PLACEMENT OF TISSUE EXPANDER AND FLEX HD (ACELLULAR HYDRATED DERMIS);  Surgeon: Wallace Going, DO;  Location: [facility];  Service: Plastics;  Laterality: Bilateral;  . ENDOMETRIAL ABLATION  1998  . KNEE SURGERY Right 2007  . MASTECTOMY W/ SENTINEL NODE BIOPSY Bilateral 04/05/2019   Procedure: BILATERAL MASTECTOMIES WITH RIGHT SENTINEL LYMPH NODE BIOPSY;  Surgeon: [name], MD;  Location: [facility];  Service: General;  Laterality: Bilateral;  . RHINOPLASTY  1977  . TONSILLECTOMY  1961    There were no vitals filed for this visit.  Subjective Assessment - 07/06/19 1102    Subjective  I am feeling good.  I am stretching at least 1x per day.  I just feel the tightness more in the chest    Pertinent History  bilateral mastectomy and immediate reconstruction on 04/05/19 by. Dr. Marlou Starks and Dillingham.  4 lymph nodes  removed all negative.  Rt. DCIS.  No radiation or Chemotherapy. No other health problems. Implant switch not yet scheduled.    Currently in Pain?  No/denies                       [facility] Adult PT Treatment/Exercise - 07/06/19 0001      Shoulder Exercises: Pulleys   Flexion  2 minutes    Flexion Limitations  with cueing on how to perform for first session    ABduction  2 minutes    ABduction Limitations  with cueing for first session       Shoulder Exercises: Therapy Ball   Flexion  Both;10 reps      Manual Therapy   Manual Therapy  Passive ROM;Myofascial release;Edema management    Edema Management  issued pt script for bras and compression garment which pt would like to get for flying.  Scheduled pt for 07/20/19 with [facility] for the sleeve and will send info to [facility]    Myofascial Release  to the Rt axilla with arm propped with pillow and towel in overhead position.  Possible cording evident vs soft tissue tightness    Passive ROM  to bil shoulders Rt>Lt all directions to tolerance                  PT Long Term Goals - 06/29/19 1003  PT LONG TERM GOAL #1   Title  Pt will improve shoulder AROM to allow for no pulling in the axilla when reaching into a cupboard    Time  4    Period  Weeks    Status  New      PT LONG TERM GOAL #2   Title  Pt will improve Rt PROM into flexion to at least 150degrees without axillary discomfort    Baseline  116    Time  4    Period  Weeks    Status  New      PT LONG TERM GOAL #3   Title  Pt will be ind with strength ABC program and education on return to weight lifting    Time  4    Period  Weeks    Status  New      PT LONG TERM GOAL #4   Title  Pt will be educated on lymphedema and risk reduction practices    Time  4    Period  Weeks    Status  Achieved            Plan - 07/06/19 1219    Clinical Impression Statement  Pt returns for first PT visit after evaluation reporting that her mobility is already  improving with her HEP.  Pt now experiences more discomfort and tightness in the chest/expanders especially the right.  Pt tolerated all treatment well still with decrease PROM/AROM bilateral shoulders and axillary restrictions / possible cording on the right side improved greatly with PROM and MFR.    PT Frequency  2x / week    PT Duration  4 weeks    PT Treatment/Interventions  ADLs/Self Care Home Management;Therapeutic activities;Therapeutic exercise;Patient/family education;Manual techniques;Manual lymph drainage;Scar mobilization;Passive range of motion    PT Next Visit Plan  bil PROM AAROM activities, manual release to the Rt axillary region, eventually strength ABC    PT Home Exercise Plan  Turning point stretches handout indicating: mermaid stretch, LTR with goalpost arms. single arm pectoralis stretch, wall walking, and butterfly stretch added       Patient will benefit from skilled therapeutic intervention in order to improve the following deficits and impairments:     Visit Diagnosis: Aftercare following surgery for neoplasm  Stiffness of left shoulder, not elsewhere classified  Stiffness of right shoulder, not elsewhere classified     Problem List Patient Active Problem List   Diagnosis Date Noted  . S/P mastectomy, bilateral 04/13/2019  . 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  . Weight gain 09/09/2013  . Routine general medical examination at a health care facility 11/25/2012  . Screening for malignant neoplasm of the cervix 11/25/2012  . Menopause 10/08/2012  . Unspecified vitamin D deficiency 10/08/2012  . Hyperlipemia 10/08/2012  . Osteoarthritis 06/04/2012    Shan Levans, PT 07/06/2019, 12:22 PM  [place], [place], [address] Phone: [phone]   Fax:  [phone]  Name: [name] MRN: [id number] Date of Birth: December 23, 1955

## 2019-07-09 ENCOUNTER — Ambulatory Visit: Payer: BC Managed Care – PPO | Admitting: Rehabilitation

## 2019-07-09 ENCOUNTER — Encounter: Payer: Self-pay | Admitting: Rehabilitation

## 2019-07-09 ENCOUNTER — Other Ambulatory Visit: Payer: Self-pay

## 2019-07-09 DIAGNOSIS — M25612 Stiffness of left shoulder, not elsewhere classified: Secondary | ICD-10-CM

## 2019-07-09 DIAGNOSIS — Z483 Aftercare following surgery for neoplasm: Secondary | ICD-10-CM | POA: Diagnosis not present

## 2019-07-09 DIAGNOSIS — M25611 Stiffness of right shoulder, not elsewhere classified: Secondary | ICD-10-CM

## 2019-07-09 NOTE — Patient Instructions (Signed)
Access Code: [email or web address] URL: [email or web address]: 05/10/2021Prepared by: Marcene Brawn TevisExercises  Doorway Pec Stretch at 90 Degrees Abduction - 1 x daily - 7 x weekly - 10 reps - 1-3 sets - 20-30 seconds hold  Standing Overhead Triceps Stretch - 1 x daily - 7 x weekly - 10 reps - 1-3 sets - 20-30 seconds hold  Standing Row with Anchored Resistance - 1 x daily - 3-4 x weekly - 10 reps - 1-3 sets  Supine Shoulder Horizontal Abduction with Resistance - 1 x daily - 7 x weekly - 10 reps - 1-3 sets  Supine Shoulder External Rotation with Resistance - 1 x daily - 7 x weekly - 10 reps - 1-3 sets  Supine PNF D2 Flexion with Resistance - 1 x daily - 7 x weekly - 10 reps - 1-3 sets

## 2019-07-09 NOTE — Therapy (Signed)
[place], [place], [address] Phone: [phone]   Fax:  [phone]  Physical Therapy Treatment  Patient Details  Name: [name] MRN: [id number] Date of Birth: 10/17/1955 Referring Provider (PT): Phoebe Sharps PA-C   Encounter Date: 07/09/2019  PT End of Session - 07/09/19 1712    Visit Number  3    Number of Visits  9    Date for PT Re-Evaluation  07/27/19    PT Start Time  1501    PT Stop Time  1554    PT Time Calculation (min)  53 min    Activity Tolerance  Patient tolerated treatment well    Behavior During Therapy  [facility] for tasks assessed/performed       Past Medical History:  Diagnosis Date  . Arthritis   . Backache, unspecified   . Colon polyps   . Hyperlipidemia   . PONV (postoperative nausea and vomiting)     Past Surgical History:  Procedure Laterality Date  . BREAST RECONSTRUCTION WITH PLACEMENT OF TISSUE EXPANDER AND FLEX HD (ACELLULAR HYDRATED DERMIS) Bilateral 04/05/2019   Procedure: BILATERAL BREAST RECONSTRUCTION WITH PLACEMENT OF TISSUE EXPANDER AND FLEX HD (ACELLULAR HYDRATED DERMIS);  Surgeon: Wallace Going, DO;  Location: [facility];  Service: Plastics;  Laterality: Bilateral;  . ENDOMETRIAL ABLATION  1998  . KNEE SURGERY Right 2007  . MASTECTOMY W/ SENTINEL NODE BIOPSY Bilateral 04/05/2019   Procedure: BILATERAL MASTECTOMIES WITH RIGHT SENTINEL LYMPH NODE BIOPSY;  Surgeon: [name], MD;  Location: [facility];  Service: General;  Laterality: Bilateral;  . RHINOPLASTY  1977  . TONSILLECTOMY  1961    There were no vitals filed for this visit.  Subjective Assessment - 07/09/19 1504    Subjective  I am feeling good.  I will get a sleeve at [facility] due to the schedule    Pertinent History  bilateral mastectomy and immediate reconstruction on 04/05/19 by. Dr. Marlou Starks and Dillingham.  4 lymph nodes removed all negative.   Rt. DCIS.  No radiation or Chemotherapy. No other health problems. Implant switch not yet scheduled.    Patient Stated Goals  get my ROM back    Currently in Pain?  No/denies                       [facility] Adult PT Treatment/Exercise - 07/09/19 0001      Shoulder Exercises: Supine   Horizontal ABduction  Both;10 reps    Theraband Level (Shoulder Horizontal ABduction)  Level 1 (Yellow)    External Rotation  Both;10 reps    Theraband Level (Shoulder External Rotation)  Level 1 (Yellow)    Diagonals  Both;10 reps    Theraband Level (Shoulder Diagonals)  Level 1 (Yellow)      Shoulder Exercises: Standing   Row  Both;10 reps    Theraband Level (Shoulder Row)  Level 1 (Yellow)      Shoulder Exercises: Pulleys   Flexion  2 minutes    ABduction  2 minutes      Shoulder Exercises: Therapy Ball   Flexion  Both;10 reps    Flexion Limitations  cueing for correct hand placement     ABduction  10 reps;Both    ABduction Limitations  cueing for initial performance       Manual Therapy   Manual therapy comments  most likely lymph node nodule noted in Rt axilla near implant  border which pt will note to her MD next week    Myofascial Release  to the Rt axilla with arm propped with pillow in overhead position.      Passive ROM  to bil shoulders Rt>Lt all directions to tolerance                  PT Long Term Goals - 06/29/19 1003      PT LONG TERM GOAL #1   Title  Pt will improve shoulder AROM to allow for no pulling in the axilla when reaching into a cupboard    Time  4    Period  Weeks    Status  New      PT LONG TERM GOAL #2   Title  Pt will improve Rt PROM into flexion to at least 150degrees without axillary discomfort    Baseline  116    Time  4    Period  Weeks    Status  New      PT LONG TERM GOAL #3   Title  Pt will be ind with strength ABC program and education on return to weight lifting    Time  4    Period  Weeks    Status  New      PT LONG TERM  GOAL #4   Title  Pt will be educated on lymphedema and risk reduction practices    Time  4    Period  Weeks    Status  Achieved            Plan - 07/09/19 1713    Clinical Impression Statement  Pt reporting continued improved mobility and feeling good after last session.  Lt PROM now WNL with some limitations still in the Rt axilla and pectoralis border with flexion, and overhead radiation position.  Pt with hard nodule around the size of a grape in the Rt axilla near the implant which pt palpated and had not noticed before.  Pt will let her MD know about this next visit next week.  Was able to advance to supine scapular exercises without pain.    PT Frequency  2x / week    PT Duration  4 weeks    PT Treatment/Interventions  ADLs/Self Care Home Management;Therapeutic activities;Therapeutic exercise;Patient/family education;Manual techniques;Manual lymph drainage;Scar mobilization;Passive range of motion    PT Next Visit Plan  bil PROM AAROM activities, manual release to the Rt axillary region, eventually strength ABC    PT Home Exercise Plan  Turning point stretches handout indicating: mermaid stretch, LTR with goalpost arms. single arm pectoralis stretch, wall walking, and butterfly stretch added, supine scap and row every other day yellow band    Consulted and Agree with Plan of Care  Patient       Patient will benefit from skilled therapeutic intervention in order to improve the following deficits and impairments:     Visit Diagnosis: Aftercare following surgery for neoplasm  Stiffness of left shoulder, not elsewhere classified  Stiffness of right shoulder, not elsewhere classified     Problem List Patient Active Problem List   Diagnosis Date Noted  . S/P mastectomy, bilateral 04/13/2019  . 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  . Weight gain 09/09/2013  . Routine general medical examination at a health care facility 11/25/2012  . Screening for  malignant neoplasm of the cervix 11/25/2012  . Menopause 10/08/2012  . Unspecified vitamin D deficiency 10/08/2012  . Hyperlipemia 10/08/2012  .  Osteoarthritis 06/04/2012    Stark Bray 07/09/2019, 5:17 PM  [place], [place], [address] Phone: [phone]   Fax:  [phone]  Name: Argelia Thurow MRN: [id number] Date of Birth: January 21, 1956

## 2019-07-16 ENCOUNTER — Other Ambulatory Visit: Payer: Self-pay

## 2019-07-16 ENCOUNTER — Ambulatory Visit: Payer: BC Managed Care – PPO | Admitting: Rehabilitation

## 2019-07-16 ENCOUNTER — Encounter: Payer: Self-pay | Admitting: Rehabilitation

## 2019-07-16 DIAGNOSIS — M25612 Stiffness of left shoulder, not elsewhere classified: Secondary | ICD-10-CM

## 2019-07-16 DIAGNOSIS — Z483 Aftercare following surgery for neoplasm: Secondary | ICD-10-CM | POA: Diagnosis not present

## 2019-07-16 DIAGNOSIS — M25611 Stiffness of right shoulder, not elsewhere classified: Secondary | ICD-10-CM

## 2019-07-16 NOTE — Therapy (Signed)
[place], [place], [address] Phone: [phone]   Fax:  [phone]  Physical Therapy Treatment  Patient Details  Name: [name] MRN: [id number] Date of Birth: Jan 06, 1956 Referring Provider (PT): Phoebe Sharps PA-C   Encounter Date: 07/16/2019  PT End of Session - 07/16/19 2159    Visit Number  4    Number of Visits  9    Date for PT Re-Evaluation  07/27/19    PT Start Time  1603    PT Stop Time  1650    PT Time Calculation (min)  47 min    Activity Tolerance  Patient tolerated treatment well    Behavior During Therapy  [facility] for tasks assessed/performed       Past Medical History:  Diagnosis Date  . Arthritis   . Backache, unspecified   . Colon polyps   . Hyperlipidemia   . PONV (postoperative nausea and vomiting)     Past Surgical History:  Procedure Laterality Date  . BREAST RECONSTRUCTION WITH PLACEMENT OF TISSUE EXPANDER AND FLEX HD (ACELLULAR HYDRATED DERMIS) Bilateral 04/05/2019   Procedure: BILATERAL BREAST RECONSTRUCTION WITH PLACEMENT OF TISSUE EXPANDER AND FLEX HD (ACELLULAR HYDRATED DERMIS);  Surgeon: Wallace Going, DO;  Location: [place];  Service: Plastics;  Laterality: Bilateral;  . ENDOMETRIAL ABLATION  1998  . KNEE SURGERY Right 2007  . MASTECTOMY W/ SENTINEL NODE BIOPSY Bilateral 04/05/2019   Procedure: BILATERAL MASTECTOMIES WITH RIGHT SENTINEL LYMPH NODE BIOPSY;  Surgeon: [name], MD;  Location: [place];  Service: General;  Laterality: Bilateral;  . RHINOPLASTY  1977  . TONSILLECTOMY  1961    There were no vitals filed for this visit.  Subjective Assessment - 07/16/19 1602    Subjective  I was able to pick my grandchild up last weekend. I have not talked to the MD yet.  I feel like I have more work to do on the right side    Pertinent History  bilateral mastectomy and immediate reconstruction on 04/05/19 by. Dr.  Marlou Starks and Dillingham.  4 lymph nodes removed all negative.  Rt. DCIS.  No radiation or Chemotherapy. No other health problems. Implant switch not yet scheduled.    Patient Stated Goals  get my ROM back    Currently in Pain?  No/denies         [facility] PT Assessment - 07/16/19 0001      AROM   Right Shoulder Flexion  146 Degrees    Right Shoulder ABduction  172 Degrees    Right Shoulder External Rotation  80 Degrees    Right Shoulder Horizontal ABduction  35 Degrees    Left Shoulder Flexion  145 Degrees    Left Shoulder ABduction  168 Degrees    Left Shoulder Internal Rotation  90 Degrees    Left Shoulder Horizontal ABduction  25 Degrees                    [facility] Adult PT Treatment/Exercise - 07/16/19 0001      Shoulder Exercises: Standing   Protraction  Both;10 reps    Protraction Limitations  straight arm push up plus at the wall       Shoulder Exercises: Pulleys   Flexion  2 minutes    ABduction  2 minutes      Shoulder Exercises: Therapy Ball   Flexion  Both;10 reps    ABduction  5 reps;Both  Manual Therapy   Manual Therapy  Soft tissue mobilization    Soft tissue mobilization  to the Rt pectoralis, latissimus with PROM    Myofascial Release  to the Rt axilla with arm propped with pillow in overhead position.      Passive ROM  to bil shoulders Rt>Lt all directions to tolerance                  PT Long Term Goals - 06/29/19 1003      PT LONG TERM GOAL #1   Title  Pt will improve shoulder AROM to allow for no pulling in the axilla when reaching into a cupboard    Time  4    Period  Weeks    Status  New      PT LONG TERM GOAL #2   Title  Pt will improve Rt PROM into flexion to at least 150degrees without axillary discomfort    Baseline  116    Time  4    Period  Weeks    Status  New      PT LONG TERM GOAL #3   Title  Pt will be ind with strength ABC program and education on return to weight lifting    Time  4    Period  Weeks     Status  New      PT LONG TERM GOAL #4   Title  Pt will be educated on lymphedema and risk reduction practices    Time  4    Period  Weeks    Status  Achieved            Plan - 07/16/19 2200    Clinical Impression Statement  Pt was seen about 1 week ago and reports not doing much with all of the family activities with some decreased AROM/PROM of the left shoulder especially into flexion which is normally WNL.  Added more PROM of the Lt shoulder which returned to [facility] after MT and reminded pt to keep including the left shoulder with daily stretches.  Rt UE demonstrates improving AROM in all directions. Pt is scheduled for one more visit only but would like to add more visits to maximize ROM    PT Frequency  2x / week    PT Duration  4 weeks    PT Treatment/Interventions  ADLs/Self Care Home Management;Therapeutic activities;Therapeutic exercise;Patient/family education;Manual techniques;Manual lymph drainage;Scar mobilization;Passive range of motion    PT Next Visit Plan  bil PROM/AAROM activities, manual release to the Rt axillary region, eventually strength ABC    PT Home Exercise Plan  Turning point stretches handout indicating: mermaid stretch, LTR with goalpost arms. single arm pectoralis stretch, wall walking, and butterfly stretch added, supine scap and row every other day yellow band    Consulted and Agree with Plan of Care  Patient       Patient will benefit from skilled therapeutic intervention in order to improve the following deficits and impairments:  Postural dysfunction, Decreased activity tolerance, Decreased range of motion, Impaired UE functional use  Visit Diagnosis: Aftercare following surgery for neoplasm  Stiffness of left shoulder, not elsewhere classified  Stiffness of right shoulder, not elsewhere classified     Problem List Patient Active Problem List   Diagnosis Date Noted  . S/P mastectomy, bilateral 04/13/2019  . Ductal carcinoma in situ (DCIS) of  right breast 01/12/2019  . Weight gain 09/09/2013  . Routine general medical examination at a health care facility 11/25/2012  .  Screening for malignant neoplasm of the cervix 11/25/2012  . Menopause 10/08/2012  . Unspecified vitamin D deficiency 10/08/2012  . Hyperlipemia 10/08/2012  . Osteoarthritis 06/04/2012    Shan Levans PT 07/16/2019, 10:07 PM  [place], [place], [address] Phone: [phone]   Fax:  [phone]  Name: Ayrabella Vink MRN: [id number] Date of Birth: [birth year]

## 2019-07-19 ENCOUNTER — Ambulatory Visit: Payer: BC Managed Care – PPO | Admitting: Plastic Surgery

## 2019-07-24 ENCOUNTER — Other Ambulatory Visit: Payer: Self-pay

## 2019-07-24 ENCOUNTER — Ambulatory Visit: Payer: BC Managed Care – PPO

## 2019-07-24 DIAGNOSIS — M25612 Stiffness of left shoulder, not elsewhere classified: Secondary | ICD-10-CM

## 2019-07-24 DIAGNOSIS — M25611 Stiffness of right shoulder, not elsewhere classified: Secondary | ICD-10-CM

## 2019-07-24 DIAGNOSIS — Z483 Aftercare following surgery for neoplasm: Secondary | ICD-10-CM | POA: Diagnosis not present

## 2019-07-24 NOTE — Therapy (Signed)
[place], [place], [address] Phone: [phone]   Fax:  [phone]  Physical Therapy Treatment  Patient Details  Name: [name] MRN: [id number] Date of Birth: Jul 10, 1955 Referring Provider (PT): Phoebe Sharps PA-C   Encounter Date: 07/24/2019  PT End of Session - 07/24/19 1006    Visit Number  5    Number of Visits  9    Date for PT Re-Evaluation  07/27/19    PT Start Time  0907   pt arrived late   PT Stop Time  1005    PT Time Calculation (min)  58 min    Activity Tolerance  Patient tolerated treatment well    Behavior During Therapy  [facility] for tasks assessed/performed       Past Medical History:  Diagnosis Date  . Arthritis   . Backache, unspecified   . Colon polyps   . Hyperlipidemia   . PONV (postoperative nausea and vomiting)     Past Surgical History:  Procedure Laterality Date  . BREAST RECONSTRUCTION WITH PLACEMENT OF TISSUE EXPANDER AND FLEX HD (ACELLULAR HYDRATED DERMIS) Bilateral 04/05/2019   Procedure: BILATERAL BREAST RECONSTRUCTION WITH PLACEMENT OF TISSUE EXPANDER AND FLEX HD (ACELLULAR HYDRATED DERMIS);  Surgeon: Wallace Going, DO;  Location: [place];  Service: Plastics;  Laterality: Bilateral;  . ENDOMETRIAL ABLATION  1998  . KNEE SURGERY Right 2007  . MASTECTOMY W/ SENTINEL NODE BIOPSY Bilateral 04/05/2019   Procedure: BILATERAL MASTECTOMIES WITH RIGHT SENTINEL LYMPH NODE BIOPSY;  Surgeon: [name], MD;  Location: [place];  Service: General;  Laterality: Bilateral;  . RHINOPLASTY  1977  . TONSILLECTOMY  1961    There were no vitals filed for this visit.  Subjective Assessment - 07/24/19 0910    Subjective  I have been doing better with being consistent with my HEP and can tell my Rt shoulder really is improving.    Pertinent History  bilateral mastectomy and immediate reconstruction on 04/05/19 by. Dr. Marlou Starks and  Dillingham.  4 lymph nodes removed all negative.  Rt. DCIS.  No radiation or Chemotherapy. No other health problems. Implant switch not yet scheduled.    Patient Stated Goals  get my ROM back    Currently in Pain?  No/denies                        [facility] Adult PT Treatment/Exercise - 07/24/19 0001      Shoulder Exercises: Pulleys   Flexion  2 minutes    ABduction  2 minutes      Shoulder Exercises: Therapy Ball   Flexion  Both;10 reps   added 1 lb to Rt wrist with forward lean into end of stretch   ABduction  Right;10 reps   1 lb on Rt wrist with same side lean into end of stretch     Manual Therapy   Manual Therapy  Soft tissue mobilization;Passive ROM    Soft tissue mobilization  --    Myofascial Release  to the Rt axilla    Passive ROM  to bil shoulders Rt>Lt all directions to tolerance                  PT Long Term Goals - 06/29/19 1003      PT LONG TERM GOAL #1   Title  Pt will improve shoulder AROM to allow for no pulling in the axilla when reaching into  a cupboard    Time  4    Period  Weeks    Status  New      PT LONG TERM GOAL #2   Title  Pt will improve Rt PROM into flexion to at least 150degrees without axillary discomfort    Baseline  116    Time  4    Period  Weeks    Status  New      PT LONG TERM GOAL #3   Title  Pt will be ind with strength ABC program and education on return to weight lifting    Time  4    Period  Weeks    Status  New      PT LONG TERM GOAL #4   Title  Pt will be educated on lymphedema and risk reduction practices    Time  4    Period  Weeks    Status  Achieved            Plan - 07/24/19 1141    Clinical Impression Statement  Since last session pt has been very compliant with HEP and reports since last session her shoulders feeling much better. Still very tight at Rt end motion from expander, but in general can tell improvement. After AA/ROM focused on manual therapy at Rt>Lt shoulders. She is  still tight at border of expander but pt reports this has much improved since starting PT. She has an appt for potentially another fill after next appt with [place] and it will be determined then if she will require more fills or if done and can decide when to have implant exchange. Pt will benefit from continued physcial therapy at this time to further progress end ROM better preparing pt for upcoming surgery.    Personal Factors and Comorbidities  Comorbidity 1    Comorbidities  SLNB    Examination-Activity Limitations  Lift    Examination-Participation Restrictions  Cleaning    Stability/Clinical Decision Making  Stable/Uncomplicated    Rehab Potential  Excellent    PT Frequency  2x / week    PT Duration  4 weeks    PT Treatment/Interventions  ADLs/Self Care Home Management;Therapeutic activities;Therapeutic exercise;Patient/family education;Manual techniques;Manual lymph drainage;Scar mobilization;Passive range of motion    PT Next Visit Plan  If pt to cont renewal in next 1-2 sessions; bil PROM/AAROM activities, manual release to the Rt axillary region, eventually strength ABC    PT Home Exercise Plan  Turning point stretches handout indicating: mermaid stretch, LTR with goalpost arms. single arm pectoralis stretch, wall walking, and butterfly stretch added, supine scap and row every other day yellow band    Consulted and Agree with Plan of Care  Patient       Patient will benefit from skilled therapeutic intervention in order to improve the following deficits and impairments:  Postural dysfunction, Decreased activity tolerance, Decreased range of motion, Impaired UE functional use  Visit Diagnosis: Aftercare following surgery for neoplasm  Stiffness of left shoulder, not elsewhere classified  Stiffness of right shoulder, not elsewhere classified     Problem List Patient Active Problem List   Diagnosis Date Noted  . S/P mastectomy, bilateral 04/13/2019  . Ductal carcinoma in situ  (DCIS) of right breast 01/12/2019  . Weight gain 09/09/2013  . Routine general medical examination at a health care facility 11/25/2012  . Screening for malignant neoplasm of the cervix 11/25/2012  . Menopause 10/08/2012  . Unspecified vitamin D deficiency 10/08/2012  . Hyperlipemia 10/08/2012  .  Osteoarthritis 06/04/2012    Otelia Limes, PTA 07/24/2019, 11:45 AM  [place], [place], [address] Phone: [phone]   Fax:  [phone]  Name: [name] MRN: [id number] Date of Birth: 09-04-1955

## 2019-07-25 NOTE — Progress Notes (Signed)
Patient is a 64 year old female with a history of right-sided breast cancer.  She presents for follow-up after bilateral mastectomies with Dr. Marlou Starks and reconstruction with placement of tissue expanders with Dr. Marla Roe on 04/05/2019.  ~ 16 weeks PO Patient is doing very well.  Incisions are healing nicely, C/D/I.  No signs of infection, redness, drainage, seroma/hematoma.  There is good skin laxity bilaterally.  Patient would like to be a little larger so would like to do a fill today.  Denies fever, recent cold symptoms, nausea/vomiting.  We placed injectable saline in the Expander using a sterile technique: Right: 50 cc for a total of 425 / 300 cc Left: 50 cc for a total of 445 / 300 cc  Follow-up in 3 weeks for possible fill.  Call office with any questions/concerns. New Rx for Valium sent to pharmacy as patient is out.  The [place] was signed into law in 2016 which includes the topic of electronic health records.  This provides immediate access to information in MyChart.  This includes consultation notes, operative notes, office notes, lab results and pathology reports.  If you have any questions about what you read please let us know at your next visit or call us at the office.  We are right here with you.

## 2019-07-26 ENCOUNTER — Other Ambulatory Visit: Payer: Self-pay

## 2019-07-26 ENCOUNTER — Ambulatory Visit: Payer: BC Managed Care – PPO | Admitting: Physical Therapy

## 2019-07-26 ENCOUNTER — Encounter: Payer: Self-pay | Admitting: Physical Therapy

## 2019-07-26 ENCOUNTER — Encounter: Payer: Self-pay | Admitting: Plastic Surgery

## 2019-07-26 ENCOUNTER — Ambulatory Visit (INDEPENDENT_AMBULATORY_CARE_PROVIDER_SITE_OTHER): Payer: BC Managed Care – PPO | Admitting: Plastic Surgery

## 2019-07-26 VITALS — BP 113/77 | HR 64 | Temp 97.3°F | Ht 67.0 in | Wt 148.0 lb

## 2019-07-26 DIAGNOSIS — Z483 Aftercare following surgery for neoplasm: Secondary | ICD-10-CM

## 2019-07-26 DIAGNOSIS — D0511 Intraductal carcinoma in situ of right breast: Secondary | ICD-10-CM

## 2019-07-26 DIAGNOSIS — M25612 Stiffness of left shoulder, not elsewhere classified: Secondary | ICD-10-CM

## 2019-07-26 DIAGNOSIS — M25611 Stiffness of right shoulder, not elsewhere classified: Secondary | ICD-10-CM

## 2019-07-26 DIAGNOSIS — Z9013 Acquired absence of bilateral breasts and nipples: Secondary | ICD-10-CM

## 2019-07-26 MED ORDER — DIAZEPAM 2 MG PO TABS: 2.0000 mg | ORAL_TABLET | Freq: Two times a day (BID) | ORAL | 0 refills | Status: DC | PRN

## 2019-07-26 NOTE — Therapy (Signed)
[place], [place], [address] Phone: [phone]   Fax:  [phone]  Physical Therapy Treatment  Patient Details  Name: [name] MRN: [id number] Date of Birth: September 27, 1955 Referring Provider (PT): Phoebe Sharps PA-C   Encounter Date: 07/26/2019  PT End of Session - 07/26/19 1456    Visit Number  6    Number of Visits  9    Date for PT Re-Evaluation  07/27/19    PT Start Time  [id number]    PT Stop Time  1455    PT Time Calculation (min)  52 min    Activity Tolerance  Patient tolerated treatment well    Behavior During Therapy  [facility] for tasks assessed/performed       Past Medical History:  Diagnosis Date  . Arthritis   . Backache, unspecified   . Colon polyps   . Hyperlipidemia   . PONV (postoperative nausea and vomiting)     Past Surgical History:  Procedure Laterality Date  . BREAST RECONSTRUCTION WITH PLACEMENT OF TISSUE EXPANDER AND FLEX HD (ACELLULAR HYDRATED DERMIS) Bilateral 04/05/2019   Procedure: BILATERAL BREAST RECONSTRUCTION WITH PLACEMENT OF TISSUE EXPANDER AND FLEX HD (ACELLULAR HYDRATED DERMIS);  Surgeon: Wallace Going, DO;  Location: [place];  Service: Plastics;  Laterality: Bilateral;  . ENDOMETRIAL ABLATION  1998  . KNEE SURGERY Right 2007  . MASTECTOMY W/ SENTINEL NODE BIOPSY Bilateral 04/05/2019   Procedure: BILATERAL MASTECTOMIES WITH RIGHT SENTINEL LYMPH NODE BIOPSY;  Surgeon: [name], MD;  Location: [place];  Service: General;  Laterality: Bilateral;  . RHINOPLASTY  1977  . TONSILLECTOMY  1961    There were no vitals filed for this visit.  Subjective Assessment - 07/26/19 1408    Subjective  I have trouble holding the cell phone between my shoulder and my ear due to pain in the area of the expander in my armpit.    Pertinent History  bilateral mastectomy and immediate reconstruction on 04/05/19 by. Dr. Marlou Starks and  Dillingham.  4 lymph nodes removed all negative.  Rt. DCIS.  No radiation or Chemotherapy. No other health problems. Implant switch not yet scheduled.    Patient Stated Goals  get my ROM back    Currently in Pain?  No/denies    Pain Score  0-No pain                        [facility] Adult PT Treatment/Exercise - 07/26/19 0001      Shoulder Exercises: Pulleys   Flexion  2 minutes    ABduction  2 minutes      Shoulder Exercises: Therapy Ball   Flexion  Both;10 reps   added 1 lb to Rt wrist with forward lean into end of stretch   ABduction  Right;10 reps   1 lb on Rt wrist with same side lean into end of stretch     Manual Therapy   Myofascial Release  to the Rt axilla and across pec insertion to help decrease tightness    Passive ROM  to bil shoulders Rt>Lt all directions to tolerance                  PT Long Term Goals - 06/29/19 1003      PT LONG TERM GOAL #1   Title  Pt will improve shoulder AROM to allow for no pulling in the axilla when reaching into a  cupboard    Time  4    Period  Weeks    Status  New      PT LONG TERM GOAL #2   Title  Pt will improve Rt PROM into flexion to at least 150degrees without axillary discomfort    Baseline  116    Time  4    Period  Weeks    Status  New      PT LONG TERM GOAL #3   Title  Pt will be ind with strength ABC program and education on return to weight lifting    Time  4    Period  Weeks    Status  New      PT LONG TERM GOAL #4   Title  Pt will be educated on lymphedema and risk reduction practices    Time  4    Period  Weeks    Status  Achieved            Plan - 07/26/19 1457    Clinical Impression Statement  Pt is progressing towards goals in therapy. She is demonstrating improved bilateral shoulder ROM with less tightness at end range. She has discomfort at lateral border of right expander where it is more superficial. Will assess goals at next session.    PT Frequency  2x / week    PT  Duration  4 weeks    PT Treatment/Interventions  ADLs/Self Care Home Management;Therapeutic activities;Therapeutic exercise;Patient/family education;Manual techniques;Manual lymph drainage;Scar mobilization;Passive range of motion    PT Next Visit Plan  assess goals, d/c or renew; bil PROM/AAROM activities, manual release to the Rt axillary region, eventually strength ABC    PT Home Exercise Plan  Turning point stretches handout indicating: mermaid stretch, LTR with goalpost arms. single arm pectoralis stretch, wall walking, and butterfly stretch added, supine scap and row every other day yellow band    Consulted and Agree with Plan of Care  Patient       Patient will benefit from skilled therapeutic intervention in order to improve the following deficits and impairments:  Postural dysfunction, Decreased activity tolerance, Decreased range of motion, Impaired UE functional use  Visit Diagnosis: Stiffness of right shoulder, not elsewhere classified  Stiffness of left shoulder, not elsewhere classified  Aftercare following surgery for neoplasm     Problem List Patient Active Problem List   Diagnosis Date Noted  . S/P mastectomy, bilateral 04/13/2019  . 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  . Weight gain 09/09/2013  . Routine general medical examination at a health care facility 11/25/2012  . Screening for malignant neoplasm of the cervix 11/25/2012  . Menopause 10/08/2012  . Unspecified vitamin D deficiency 10/08/2012  . Hyperlipemia 10/08/2012  . Osteoarthritis 06/04/2012    [name] [facility] 07/26/2019, 2:59 PM  [place], [place], [address] Phone: [phone]   Fax:  [phone]  Name: [name] MRN: [id number] Date of Birth: 11-06-1955  Manus Gunning, PT 07/26/19 2:59 PM

## 2019-08-02 ENCOUNTER — Encounter: Payer: BC Managed Care – PPO | Admitting: Physical Therapy

## 2019-08-15 ENCOUNTER — Other Ambulatory Visit: Payer: Self-pay

## 2019-08-15 ENCOUNTER — Encounter: Payer: Self-pay | Admitting: Rehabilitation

## 2019-08-15 ENCOUNTER — Ambulatory Visit: Payer: BC Managed Care – PPO | Attending: Plastic Surgery | Admitting: Rehabilitation

## 2019-08-15 DIAGNOSIS — M25612 Stiffness of left shoulder, not elsewhere classified: Secondary | ICD-10-CM | POA: Insufficient documentation

## 2019-08-15 DIAGNOSIS — Z483 Aftercare following surgery for neoplasm: Secondary | ICD-10-CM | POA: Diagnosis present

## 2019-08-15 DIAGNOSIS — M25611 Stiffness of right shoulder, not elsewhere classified: Secondary | ICD-10-CM | POA: Insufficient documentation

## 2019-08-15 NOTE — Therapy (Signed)
[place], [place], [address] Phone: [phone]   Fax:  [phone]  Physical Therapy Treatment  Patient Details  Name: [name] MRN: [id number] Date of Birth: 12/18/1955 Referring Provider (PT): Phoebe Sharps PA-C   Encounter Date: 08/15/2019   PT End of Session - 08/15/19 1647    Visit Number 7    Number of Visits 9    PT Start Time 1600    PT Stop Time 1642    PT Time Calculation (min) 42 min    Activity Tolerance Patient tolerated treatment well    Behavior During Therapy [facility] for tasks assessed/performed           Past Medical History:  Diagnosis Date  . Arthritis   . Backache, unspecified   . Colon polyps   . Hyperlipidemia   . PONV (postoperative nausea and vomiting)     Past Surgical History:  Procedure Laterality Date  . BREAST RECONSTRUCTION WITH PLACEMENT OF TISSUE EXPANDER AND FLEX HD (ACELLULAR HYDRATED DERMIS) Bilateral 04/05/2019   Procedure: BILATERAL BREAST RECONSTRUCTION WITH PLACEMENT OF TISSUE EXPANDER AND FLEX HD (ACELLULAR HYDRATED DERMIS);  Surgeon: Wallace Going, DO;  Location: [facility];  Service: Plastics;  Laterality: Bilateral;  . ENDOMETRIAL ABLATION  1998  . KNEE SURGERY Right 2007  . MASTECTOMY W/ SENTINEL NODE BIOPSY Bilateral 04/05/2019   Procedure: BILATERAL MASTECTOMIES WITH RIGHT SENTINEL LYMPH NODE BIOPSY;  Surgeon: [name], MD;  Location: [facility];  Service: General;  Laterality: Bilateral;  . RHINOPLASTY  1977  . TONSILLECTOMY  1961    There were no vitals filed for this visit.   Subjective Assessment - 08/15/19 1602    Subjective I may do a couple more fills but it is getting close.    Pertinent History bilateral mastectomy and immediate reconstruction on 04/05/19 by. Dr. Marlou Starks and [place].  4 lymph nodes removed all negative.  Rt. DCIS.  No radiation or Chemotherapy. No other health problems.  Implant switch not yet scheduled.    Patient Stated Goals get my ROM back    Currently in Pain? No/denies              [facility] PT Assessment - 08/15/19 0001      AROM   Right Shoulder Flexion 142 Degrees                         [facility] Adult PT Treatment/Exercise - 08/15/19 0001      Exercises   Exercises Other Exercises    Other Exercises  Education on strength ABC for final HEP and use after implant switch.  Performed each stretch x 15 seconds:; single arm wall, anterior deltoid, tricep stretch, then with 2# weights scaption, bicep curls, heel raises, dead lifts, row and tricep extension x 10 each bil, supine chest press 2# x 10 bil, bridgex 10 and mini crunch x 10       Manual Therapy   Manual therapy comments Reassessed AROM and PROM for DC visit                       PT Long Term Goals - 08/15/19 1603      PT LONG TERM GOAL #1   Title Pt will improve shoulder AROM to allow for no pulling in the axilla when reaching into a cupboard    Baseline No pull    Status  Achieved      PT LONG TERM GOAL #2   Title Pt will improve Rt PROM into flexion to at least 150degrees without axillary discomfort    Baseline 116   08/15/19: 140 with no pull and equal between sides    Status Partially Met      PT LONG TERM GOAL #3   Title Pt will be ind with strength ABC program and education on return to weight lifting      PT LONG TERM GOAL #4   Title Pt will be educated on lymphedema and risk reduction practices    Baseline Educated on this    Status Achieved                 Plan - 08/15/19 1647    Clinical Impression Statement Pt arrives 2 weeks after last PT session reporting feeling great and no limitations with ROM and doing well.  No limitations in PROM bilaterally and AROM did not quite meet the expected goal but pt is equal bilaterally and WNL without pain.  Pt is now ind with HEP for stretching and strengthening.    PT Frequency 2x / week    PT  Duration 4 weeks    PT Treatment/Interventions ADLs/Self Care Home Management;Therapeutic activities;Therapeutic exercise;Patient/family education;Manual techniques;Manual lymph drainage;Scar mobilization;Passive range of motion           Patient will benefit from skilled therapeutic intervention in order to improve the following deficits and impairments:     Visit Diagnosis: Stiffness of right shoulder, not elsewhere classified  Stiffness of left shoulder, not elsewhere classified  Aftercare following surgery for neoplasm     Problem List Patient Active Problem List   Diagnosis Date Noted  . S/P mastectomy, bilateral 04/13/2019  . 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  . Weight gain 09/09/2013  . Routine general medical examination at a health care facility 11/25/2012  . Screening for malignant neoplasm of the cervix 11/25/2012  . Menopause 10/08/2012  . Unspecified vitamin D deficiency 10/08/2012  . Hyperlipemia 10/08/2012  . Osteoarthritis 06/04/2012    Shan Levans, PT 08/15/2019, 4:52 PM  [place], [place], [address] Phone: [phone]   Fax:  [phone]  Name: [name] MRN: [id number] Date of Birth: 23-Jul-1955   PHYSICAL THERAPY DISCHARGE SUMMARY  Visits from Start of Care: 7  Current functional level related to goals / functional outcomes: See above   Remaining deficits: Need for implant switch   Education / Equipment: Final HEP Plan: Patient agrees to discharge.  Patient goals were partially met. Patient is being discharged due to being pleased with the current functional level.  ?????    Shan Levans, PT

## 2019-08-16 ENCOUNTER — Ambulatory Visit: Payer: BC Managed Care – PPO | Admitting: Plastic Surgery

## 2019-08-17 ENCOUNTER — Ambulatory Visit: Payer: BC Managed Care – PPO | Admitting: Rehabilitation

## 2019-08-19 NOTE — Progress Notes (Signed)
Patient is a 64 year old female with a history of right-sided breast cancer.  She presents for follow-up after bilateral mastectomies with Dr. Marlou Starks and reconstruction with placement of tissue expanders with Dr. Marla Roe on 04/05/2019.  ~ 19 weeks PO Patient reports she is doing well overall.  No issues with her last fill.  Incisions have healed very nicely, C/D/I.  No signs of infection, drainage, redness, seroma/hematoma.  She has been applying cocoa butter twice daily to her breasts which has helped with improving skin laxity.  She would like to be a little bit larger and so would like to do a fill today.  We placed injectable saline in the Expander using a sterile technique: Right: 50 cc for a total of 475 / 300 cc Left: 50 cc for a total of 495 / 300 cc  Follow-up in 3 weeks fill. Patient is very close to her desired size so will try to schedule one of her next appointments with Dr. Marla Roe to discuss exchange to implants. Call office with any questions/concerns.  The [place] was signed into law in 2016 which includes the topic of electronic health records.  This provides immediate access to information in MyChart.  This includes consultation notes, operative notes, office notes, lab results and pathology reports.  If you have any questions about what you read please let us know at your next visit or call us at the office.  We are right here with you.

## 2019-08-20 ENCOUNTER — Encounter: Payer: Self-pay | Admitting: Plastic Surgery

## 2019-08-20 ENCOUNTER — Other Ambulatory Visit: Payer: Self-pay

## 2019-08-20 ENCOUNTER — Ambulatory Visit (INDEPENDENT_AMBULATORY_CARE_PROVIDER_SITE_OTHER): Payer: BC Managed Care – PPO | Admitting: Plastic Surgery

## 2019-08-20 VITALS — BP 124/82 | HR 71 | Temp 98.2°F | Ht 67.0 in | Wt 148.0 lb

## 2019-08-20 DIAGNOSIS — D0511 Intraductal carcinoma in situ of right breast: Secondary | ICD-10-CM

## 2019-08-20 DIAGNOSIS — Z9013 Acquired absence of bilateral breasts and nipples: Secondary | ICD-10-CM

## 2019-08-20 MED ORDER — DIAZEPAM 2 MG PO TABS: 2.0000 mg | ORAL_TABLET | Freq: Two times a day (BID) | ORAL | 0 refills | Status: DC | PRN

## 2019-09-10 NOTE — H&P (View-Only) (Signed)
Patient is a 64 year old female here for follow-up after undergoing bilateral mastectomies with Dr. Marlou Starks and placement of tissue expanders with Dr. Marla Roe on 04/05/2019.  ~ 23 weeks PO Patient reports she is doing very well today. No issues with her last fill. She has limited laxity of her skin bilaterally, but would like an additional fill if possible. Incisions healing well. C/d/i. No signs of infection, redness, drainage, seroma/hematoma.  Dr. Marla Roe saw patient today and both agree she is ready to move forward with exchange of expanders to implants. Breasts sit laterally, to be addressed at time of surgery.  We placed injectable saline in the Expander using a sterile technique: Right: 20 cc for a total of 495 / 300 cc Left: 20 cc for a total of 515 / 300 cc  Next appt will be pre-op appointment.  Call office with any questions/concerns.   The [place] was signed into law in 2016 which includes the topic of electronic health records.  This provides immediate access to information in MyChart.  This includes consultation notes, operative notes, office notes, lab results and pathology reports.  If you have any questions about what you read please let us know at your next visit or call us at the office.  We are right here with you.

## 2019-09-10 NOTE — Progress Notes (Signed)
Patient is a 64 year old female here for follow-up after undergoing bilateral mastectomies with Dr. Marlou Starks and placement of tissue expanders with Dr. Marla Roe on 04/05/2019.  ~ 23 weeks PO Patient reports she is doing very well today. No issues with her last fill. She has limited laxity of her skin bilaterally, but would like an additional fill if possible. Incisions healing well. C/d/i. No signs of infection, redness, drainage, seroma/hematoma.  Dr. Marla Roe saw patient today and both agree she is ready to move forward with exchange of expanders to implants. Breasts sit laterally, to be addressed at time of surgery.  We placed injectable saline in the Expander using a sterile technique: Right: 20 cc for a total of 495 / 300 cc Left: 20 cc for a total of 515 / 300 cc  Next appt will be pre-op appointment.  Call office with any questions/concerns.   The [place] was signed into law in 2016 which includes the topic of electronic health records.  This provides immediate access to information in MyChart.  This includes consultation notes, operative notes, office notes, lab results and pathology reports.  If you have any questions about what you read please let us know at your next visit or call us at the office.  We are right here with you.

## 2019-09-11 ENCOUNTER — Other Ambulatory Visit: Payer: Self-pay

## 2019-09-11 ENCOUNTER — Encounter: Payer: Self-pay | Admitting: Plastic Surgery

## 2019-09-11 ENCOUNTER — Ambulatory Visit (INDEPENDENT_AMBULATORY_CARE_PROVIDER_SITE_OTHER): Payer: BC Managed Care – PPO | Admitting: Plastic Surgery

## 2019-09-11 VITALS — BP 119/91 | HR 67 | Temp 97.8°F

## 2019-09-11 DIAGNOSIS — Z9013 Acquired absence of bilateral breasts and nipples: Secondary | ICD-10-CM

## 2019-09-11 DIAGNOSIS — D0511 Intraductal carcinoma in situ of right breast: Secondary | ICD-10-CM

## 2019-09-11 MED ORDER — DIAZEPAM 2 MG PO TABS: 2.0000 mg | ORAL_TABLET | Freq: Two times a day (BID) | ORAL | 0 refills | Status: DC | PRN

## 2019-09-21 ENCOUNTER — Encounter (HOSPITAL_BASED_OUTPATIENT_CLINIC_OR_DEPARTMENT_OTHER): Payer: Self-pay | Admitting: Plastic Surgery

## 2019-09-21 ENCOUNTER — Ambulatory Visit (INDEPENDENT_AMBULATORY_CARE_PROVIDER_SITE_OTHER): Payer: BC Managed Care – PPO | Admitting: Plastic Surgery

## 2019-09-21 ENCOUNTER — Other Ambulatory Visit: Payer: Self-pay

## 2019-09-21 ENCOUNTER — Encounter: Payer: Self-pay | Admitting: Plastic Surgery

## 2019-09-21 VITALS — BP 126/88 | HR 65 | Temp 98.1°F

## 2019-09-21 DIAGNOSIS — Z9013 Acquired absence of bilateral breasts and nipples: Secondary | ICD-10-CM

## 2019-09-21 DIAGNOSIS — D0511 Intraductal carcinoma in situ of right breast: Secondary | ICD-10-CM

## 2019-09-24 ENCOUNTER — Other Ambulatory Visit (HOSPITAL_COMMUNITY)
Admission: RE | Admit: 2019-09-24 | Discharge: 2019-09-24 | Disposition: A | Discharge status: Home / Self Care | Payer: BC Managed Care – PPO | Source: Ambulatory Visit | Attending: Plastic Surgery | Admitting: Plastic Surgery

## 2019-09-24 ENCOUNTER — Encounter: Payer: Self-pay | Admitting: Plastic Surgery

## 2019-09-24 ENCOUNTER — Encounter (HOSPITAL_BASED_OUTPATIENT_CLINIC_OR_DEPARTMENT_OTHER): Payer: Self-pay | Admitting: Plastic Surgery

## 2019-09-24 ENCOUNTER — Other Ambulatory Visit: Payer: Self-pay

## 2019-09-24 DIAGNOSIS — Z20822 Contact with and (suspected) exposure to covid-19: Secondary | ICD-10-CM | POA: Insufficient documentation

## 2019-09-24 DIAGNOSIS — Z01812 Encounter for preprocedural laboratory examination: Secondary | ICD-10-CM | POA: Insufficient documentation

## 2019-09-24 LAB — SARS CORONAVIRUS 2 (TAT 6-24 HRS): SARS Coronavirus 2: NEGATIVE

## 2019-09-24 NOTE — Progress Notes (Signed)
Patient is a 64 yr-old female here or follow-up after undergoing bilateral mastectomies with Dr. Marlou Starks and placement of tissue expanders with Dr. Marla Roe on 04/05/19.   ~ 25 weeks PO She presents today due to concerns that her right breast tissue expander feels "mushy". Upon exam, tissue expander is no longer taught. Right breast is visibly smaller than left breast. Free fluid can be felt upon palpation in the capsule. Suspect ruptured tissue expander. Incision is intact and well healed. No signs of redness, drainage, infection. Patient denies F, CP, N/V, SOB. Patient reports she has been urinating more than usual today.  Patient is at the size she would like to be, so will plan to exchange tissue expanders for bilateral implants within the week. Call office with any questions/concerns.  The [place] was signed into law in 2016 which includes the topic of electronic health records.  This provides immediate access to information in MyChart.  This includes consultation notes, operative notes, office notes, lab results and pathology reports.  If you have any questions about what you read please let us know at your next visit or call us at the office.  We are right here with you.

## 2019-09-24 NOTE — H&P (View-Only) (Signed)
Patient is a 64 yr-old female here or follow-up after undergoing bilateral mastectomies with Dr. Marlou Starks and placement of tissue expanders with Dr. Marla Roe on 04/05/19.   ~ 25 weeks PO She presents today due to concerns that her right breast tissue expander feels "mushy". Upon exam, tissue expander is no longer taught. Right breast is visibly smaller than left breast. Free fluid can be felt upon palpation in the capsule. Suspect ruptured tissue expander. Incision is intact and well healed. No signs of redness, drainage, infection. Patient denies F, CP, N/V, SOB. Patient reports she has been urinating more than usual today.  Patient is at the size she would like to be, so will plan to exchange tissue expanders for bilateral implants within the week. Call office with any questions/concerns.  The [place] was signed into law in 2016 which includes the topic of electronic health records.  This provides immediate access to information in MyChart.  This includes consultation notes, operative notes, office notes, lab results and pathology reports.  If you have any questions about what you read please let us know at your next visit or call us at the office.  We are right here with you.

## 2019-09-25 ENCOUNTER — Telehealth: Payer: Self-pay | Admitting: Plastic Surgery

## 2019-09-25 NOTE — Telephone Encounter (Signed)
Spoke with patient and answered all her questions to her satisfaction.

## 2019-09-25 NOTE — Telephone Encounter (Signed)
Patient called to see if she could reschedule her post op appt to earlier in the morning and to see if she could speak with Venetia Night before tomorrow's surgery because she has a few questions she didn't think of at her last appt. Appt rescheduled. Please call patient on cell to discuss additional surgery related questions.

## 2019-09-26 ENCOUNTER — Encounter (HOSPITAL_BASED_OUTPATIENT_CLINIC_OR_DEPARTMENT_OTHER)
Admission: RE | Disposition: A | Discharge status: Home / Self Care | Payer: Self-pay | Source: Home / Self Care | Attending: Plastic Surgery

## 2019-09-26 ENCOUNTER — Other Ambulatory Visit: Payer: Self-pay

## 2019-09-26 ENCOUNTER — Other Ambulatory Visit (INDEPENDENT_AMBULATORY_CARE_PROVIDER_SITE_OTHER): Payer: BC Managed Care – PPO | Admitting: Plastic Surgery

## 2019-09-26 ENCOUNTER — Ambulatory Visit (HOSPITAL_BASED_OUTPATIENT_CLINIC_OR_DEPARTMENT_OTHER): Payer: BC Managed Care – PPO | Admitting: Anesthesiology

## 2019-09-26 ENCOUNTER — Encounter (HOSPITAL_BASED_OUTPATIENT_CLINIC_OR_DEPARTMENT_OTHER): Payer: Self-pay | Admitting: Plastic Surgery

## 2019-09-26 ENCOUNTER — Ambulatory Visit (HOSPITAL_BASED_OUTPATIENT_CLINIC_OR_DEPARTMENT_OTHER)
Admission: RE | Admit: 2019-09-26 | Discharge: 2019-09-26 | Disposition: A | Discharge status: Home / Self Care | Payer: BC Managed Care – PPO | Attending: Plastic Surgery | Admitting: Plastic Surgery

## 2019-09-26 DIAGNOSIS — Z79899 Other long term (current) drug therapy: Secondary | ICD-10-CM | POA: Insufficient documentation

## 2019-09-26 DIAGNOSIS — Z9013 Acquired absence of bilateral breasts and nipples: Secondary | ICD-10-CM | POA: Insufficient documentation

## 2019-09-26 DIAGNOSIS — Z853 Personal history of malignant neoplasm of breast: Secondary | ICD-10-CM | POA: Diagnosis not present

## 2019-09-26 DIAGNOSIS — M1990 Unspecified osteoarthritis, unspecified site: Secondary | ICD-10-CM | POA: Insufficient documentation

## 2019-09-26 DIAGNOSIS — D0511 Intraductal carcinoma in situ of right breast: Secondary | ICD-10-CM

## 2019-09-26 HISTORY — DX: Malignant (primary) neoplasm, unspecified: C80.1

## 2019-09-26 HISTORY — PX: REMOVAL OF BILATERAL TISSUE EXPANDERS WITH PLACEMENT OF BILATERAL BREAST IMPLANTS: SHX6431

## 2019-09-26 SURGERY — REMOVAL, TISSUE EXPANDER, BREAST, BILATERAL, WITH BILATERAL IMPLANT IMPLANT INSERTION
Anesthesia: General | Site: Breast | Laterality: Bilateral

## 2019-09-26 MED ORDER — EPHEDRINE SULFATE 50 MG/ML IJ SOLN
INTRAMUSCULAR | Status: DC | PRN
  Administered 2019-09-26: 10 mg via INTRAVENOUS
  Administered 2019-09-26 (×2): 5 mg via INTRAVENOUS

## 2019-09-26 MED ORDER — CEPHALEXIN 500 MG PO CAPS: 500.0000 mg | ORAL_CAPSULE | Freq: Four times a day (QID) | ORAL | 0 refills | Status: AC

## 2019-09-26 MED ORDER — CHLORHEXIDINE GLUCONATE CLOTH 2 % EX PADS: 6.0000 | MEDICATED_PAD | Freq: Once | CUTANEOUS | Status: DC

## 2019-09-26 MED ORDER — OXYCODONE HCL 5 MG PO TABS
ORAL_TABLET | ORAL | Status: AC
  Filled 2019-09-26: qty 1

## 2019-09-26 MED ORDER — FENTANYL CITRATE (PF) 100 MCG/2ML IJ SOLN
INTRAMUSCULAR | Status: DC | PRN
  Administered 2019-09-26: 25 ug via INTRAVENOUS
  Administered 2019-09-26: 50 ug via INTRAVENOUS
  Administered 2019-09-26: 25 ug via INTRAVENOUS

## 2019-09-26 MED ORDER — FENTANYL CITRATE (PF) 100 MCG/2ML IJ SOLN
INTRAMUSCULAR | Status: AC
  Filled 2019-09-26: qty 2

## 2019-09-26 MED ORDER — ONDANSETRON HCL 4 MG/2ML IJ SOLN: 4.0000 mg | Freq: Four times a day (QID) | INTRAMUSCULAR | Status: DC | PRN

## 2019-09-26 MED ORDER — ACETAMINOPHEN 325 MG PO TABS: 650.0000 mg | ORAL_TABLET | ORAL | Status: DC | PRN

## 2019-09-26 MED ORDER — DEXAMETHASONE SODIUM PHOSPHATE 10 MG/ML IJ SOLN
INTRAMUSCULAR | Status: AC
  Filled 2019-09-26: qty 1

## 2019-09-26 MED ORDER — MIDAZOLAM HCL 5 MG/5ML IJ SOLN
INTRAMUSCULAR | Status: DC | PRN
  Administered 2019-09-26: 2 mg via INTRAVENOUS

## 2019-09-26 MED ORDER — SODIUM CHLORIDE 0.9% FLUSH: 3.0000 mL | INTRAVENOUS | Status: DC | PRN

## 2019-09-26 MED ORDER — HYDROCODONE-ACETAMINOPHEN 5-325 MG PO TABS: 1.0000 | ORAL_TABLET | Freq: Three times a day (TID) | ORAL | 0 refills | Status: AC | PRN

## 2019-09-26 MED ORDER — DEXAMETHASONE SODIUM PHOSPHATE 10 MG/ML IJ SOLN
INTRAMUSCULAR | Status: DC | PRN
  Administered 2019-09-26: 5 mg via INTRAVENOUS

## 2019-09-26 MED ORDER — MORPHINE SULFATE (PF) 4 MG/ML IV SOLN: 2.0000 mg | INTRAVENOUS | Status: DC | PRN

## 2019-09-26 MED ORDER — CEFAZOLIN SODIUM-DEXTROSE 2-4 GM/100ML-% IV SOLN
INTRAVENOUS | Status: AC
  Filled 2019-09-26: qty 100

## 2019-09-26 MED ORDER — OXYCODONE HCL 5 MG PO TABS
5.0000 mg | ORAL_TABLET | Freq: Once | ORAL | Status: AC | PRN
  Administered 2019-09-26: 5 mg via ORAL

## 2019-09-26 MED ORDER — PROPOFOL 500 MG/50ML IV EMUL
INTRAVENOUS | Status: AC
  Filled 2019-09-26: qty 50

## 2019-09-26 MED ORDER — SCOPOLAMINE 1 MG/3DAYS TD PT72
1.0000 | MEDICATED_PATCH | TRANSDERMAL | Status: DC
  Administered 2019-09-26: 1.5 mg via TRANSDERMAL

## 2019-09-26 MED ORDER — LIDOCAINE 2% (20 MG/ML) 5 ML SYRINGE
INTRAMUSCULAR | Status: AC
  Filled 2019-09-26: qty 10

## 2019-09-26 MED ORDER — PROPOFOL 500 MG/50ML IV EMUL
INTRAVENOUS | Status: DC | PRN
Start: 2019-09-26 — End: 2019-09-26
  Administered 2019-09-26: 25 ug/kg/min via INTRAVENOUS

## 2019-09-26 MED ORDER — PROPOFOL 10 MG/ML IV BOLUS
INTRAVENOUS | Status: AC
  Filled 2019-09-26: qty 20

## 2019-09-26 MED ORDER — SODIUM CHLORIDE 0.9 % IV SOLN: 250.0000 mL | INTRAVENOUS | Status: DC | PRN

## 2019-09-26 MED ORDER — CEFAZOLIN SODIUM-DEXTROSE 2-4 GM/100ML-% IV SOLN
2.0000 g | INTRAVENOUS | Status: AC
  Administered 2019-09-26: 2 g via INTRAVENOUS

## 2019-09-26 MED ORDER — ACETAMINOPHEN 325 MG RE SUPP: 650.0000 mg | RECTAL | Status: DC | PRN

## 2019-09-26 MED ORDER — PROPOFOL 10 MG/ML IV BOLUS
INTRAVENOUS | Status: DC | PRN
  Administered 2019-09-26: 140 mg via INTRAVENOUS

## 2019-09-26 MED ORDER — SODIUM CHLORIDE 0.9 % IV SOLN
INTRAVENOUS | Status: DC | PRN
  Administered 2019-09-26: 500 mL

## 2019-09-26 MED ORDER — OXYCODONE HCL 5 MG/5ML PO SOLN: 5.0000 mg | Freq: Once | ORAL | Status: AC | PRN

## 2019-09-26 MED ORDER — SODIUM CHLORIDE 0.9% FLUSH: 3.0000 mL | Freq: Two times a day (BID) | INTRAVENOUS | Status: DC

## 2019-09-26 MED ORDER — ONDANSETRON HCL 4 MG/2ML IJ SOLN
INTRAMUSCULAR | Status: DC | PRN
  Administered 2019-09-26: 4 mg via INTRAVENOUS

## 2019-09-26 MED ORDER — LIDOCAINE HCL (CARDIAC) PF 100 MG/5ML IV SOSY
PREFILLED_SYRINGE | INTRAVENOUS | Status: DC | PRN
  Administered 2019-09-26: 60 mg via INTRAVENOUS

## 2019-09-26 MED ORDER — MIDAZOLAM HCL 2 MG/2ML IJ SOLN
INTRAMUSCULAR | Status: AC
  Filled 2019-09-26: qty 2

## 2019-09-26 MED ORDER — OXYCODONE HCL 5 MG PO TABS: 5.0000 mg | ORAL_TABLET | ORAL | Status: DC | PRN

## 2019-09-26 MED ORDER — LACTATED RINGERS IV SOLN: INTRAVENOUS | Status: DC

## 2019-09-26 MED ORDER — FENTANYL CITRATE (PF) 100 MCG/2ML IJ SOLN
25.0000 ug | INTRAMUSCULAR | Status: DC | PRN
  Administered 2019-09-26 (×2): 25 ug via INTRAVENOUS

## 2019-09-26 MED ORDER — LIDOCAINE-EPINEPHRINE 1 %-1:100000 IJ SOLN
INTRAMUSCULAR | Status: DC | PRN
  Administered 2019-09-26: 7 mL

## 2019-09-26 SURGICAL SUPPLY — 79 items
ADH SKN CLS APL DERMABOND .7 (GAUZE/BANDAGES/DRESSINGS) ×2
BAG DECANTER FOR FLEXI CONT (MISCELLANEOUS) ×3 IMPLANT
BINDER BREAST LRG (GAUZE/BANDAGES/DRESSINGS) ×2 IMPLANT
BINDER BREAST MEDIUM (GAUZE/BANDAGES/DRESSINGS) IMPLANT
BINDER BREAST XLRG (GAUZE/BANDAGES/DRESSINGS) IMPLANT
BINDER BREAST XXLRG (GAUZE/BANDAGES/DRESSINGS) IMPLANT
BIOPATCH RED 1 DISK 7.0 (GAUZE/BANDAGES/DRESSINGS) IMPLANT
BIOPATCH RED 1IN DISK 7.0MM (GAUZE/BANDAGES/DRESSINGS)
BLADE HEX COATED 2.75 (ELECTRODE) ×3 IMPLANT
BLADE SURG 15 STRL LF DISP TIS (BLADE) ×2 IMPLANT
BLADE SURG 15 STRL SS (BLADE) ×6
CANISTER SUCT 1200ML W/VALVE (MISCELLANEOUS) ×3 IMPLANT
CORD BIPOLAR FORCEPS 12FT (ELECTRODE) IMPLANT
COVER BACK TABLE 60X90IN (DRAPES) ×3 IMPLANT
COVER MAYO STAND STRL (DRAPES) ×3 IMPLANT
COVER WAND RF STERILE (DRAPES) IMPLANT
DECANTER SPIKE VIAL GLASS SM (MISCELLANEOUS) IMPLANT
DERMABOND ADVANCED (GAUZE/BANDAGES/DRESSINGS) ×4
DERMABOND ADVANCED .7 DNX12 (GAUZE/BANDAGES/DRESSINGS) IMPLANT
DRAIN CHANNEL 19F RND (DRAIN) IMPLANT
DRAPE LAPAROSCOPIC ABDOMINAL (DRAPES) ×3 IMPLANT
DRSG OPSITE POSTOP 4X6 (GAUZE/BANDAGES/DRESSINGS) ×4 IMPLANT
DRSG PAD ABDOMINAL 8X10 ST (GAUZE/BANDAGES/DRESSINGS) ×6 IMPLANT
ELECT BLADE 4.0 EZ CLEAN MEGAD (MISCELLANEOUS) ×3
ELECT REM PT RETURN 9FT ADLT (ELECTROSURGICAL) ×3
ELECTRODE BLDE 4.0 EZ CLN MEGD (MISCELLANEOUS) ×1 IMPLANT
ELECTRODE REM PT RTRN 9FT ADLT (ELECTROSURGICAL) ×1 IMPLANT
EVACUATOR SILICONE 100CC (DRAIN) IMPLANT
FUNNEL KELLER 2 DISP (MISCELLANEOUS) IMPLANT
GAUZE SPONGE 4X4 12PLY STRL LF (GAUZE/BANDAGES/DRESSINGS) IMPLANT
GLOVE BIO SURGEON STRL SZ 6.5 (GLOVE) ×7 IMPLANT
GLOVE BIO SURGEON STRL SZ7 (GLOVE) ×1 IMPLANT
GLOVE BIO SURGEONS STRL SZ 6.5 (GLOVE) ×4
GLOVE BIOGEL PI IND STRL 6.5 (GLOVE) IMPLANT
GLOVE BIOGEL PI IND STRL 7.0 (GLOVE) IMPLANT
GLOVE BIOGEL PI INDICATOR 6.5 (GLOVE) ×2
GLOVE BIOGEL PI INDICATOR 7.0 (GLOVE) ×2
GLOVE ECLIPSE 6.5 STRL STRAW (GLOVE) ×2 IMPLANT
GOWN STRL REUS W/ TWL LRG LVL3 (GOWN DISPOSABLE) ×2 IMPLANT
GOWN STRL REUS W/TWL LRG LVL3 (GOWN DISPOSABLE) ×12
IMPL BREAST SMOOTH UH 455CC (Breast) IMPLANT
IMPLANT BREAST SMOOTH UH 455CC (Breast) ×6 IMPLANT
IV NS 1000ML (IV SOLUTION)
IV NS 1000ML BAXH (IV SOLUTION) IMPLANT
IV NS 500ML (IV SOLUTION)
IV NS 500ML BAXH (IV SOLUTION) IMPLANT
KIT FILL SYSTEM UNIVERSAL (SET/KITS/TRAYS/PACK) IMPLANT
NDL HYPO 25X1 1.5 SAFETY (NEEDLE) ×1 IMPLANT
NDL SAFETY ECLIPSE 18X1.5 (NEEDLE) ×1 IMPLANT
NEEDLE HYPO 18GX1.5 SHARP (NEEDLE) ×3
NEEDLE HYPO 25X1 1.5 SAFETY (NEEDLE) ×3 IMPLANT
PACK BASIN DAY SURGERY FS (CUSTOM PROCEDURE TRAY) ×3 IMPLANT
PENCIL SMOKE EVACUATOR (MISCELLANEOUS) ×3 IMPLANT
PIN SAFETY STERILE (MISCELLANEOUS) IMPLANT
SIZER BREAST GEL REUSE 480CC (SIZER) ×3
SIZER BREAST REUSE 455CC (SIZER) ×3
SIZER BREAST REUSE 535CC (SIZER) ×3
SIZER BRST GEL REUSE 480CC (SIZER) IMPLANT
SIZER BRST REUSE 455CC (SIZER) IMPLANT
SIZER BRST REUSE ULT HI 535CC (SIZER) IMPLANT
SLEEVE SCD COMPRESS KNEE MED (MISCELLANEOUS) ×3 IMPLANT
SPONGE LAP 18X18 RF (DISPOSABLE) ×6 IMPLANT
STRIP SUTURE WOUND CLOSURE 1/2 (MISCELLANEOUS) IMPLANT
SUT MNCRL AB 4-0 PS2 18 (SUTURE) ×8 IMPLANT
SUT MON AB 3-0 SH 27 (SUTURE) ×12
SUT MON AB 3-0 SH27 (SUTURE) ×4 IMPLANT
SUT MON AB 5-0 PS2 18 (SUTURE) ×5 IMPLANT
SUT PDS AB 2-0 CT2 27 (SUTURE) IMPLANT
SUT VIC AB 3-0 SH 27 (SUTURE)
SUT VIC AB 3-0 SH 27X BRD (SUTURE) IMPLANT
SUT VICRYL 4-0 PS2 18IN ABS (SUTURE) IMPLANT
SYR BULB IRRIG 60ML STRL (SYRINGE) ×3 IMPLANT
SYR CONTROL 10ML LL (SYRINGE) ×3 IMPLANT
TOWEL GREEN STERILE FF (TOWEL DISPOSABLE) ×6 IMPLANT
TRAY DSU PREP LF (CUSTOM PROCEDURE TRAY) ×3 IMPLANT
TUBE CONNECTING 20'X1/4 (TUBING) ×1
TUBE CONNECTING 20X1/4 (TUBING) ×2 IMPLANT
UNDERPAD 30X36 HEAVY ABSORB (UNDERPADS AND DIAPERS) ×6 IMPLANT
YANKAUER SUCT BULB TIP NO VENT (SUCTIONS) ×3 IMPLANT

## 2019-09-26 NOTE — Interval H&P Note (Signed)
History and Physical Interval Note:  09/26/2019 3:24 PM  [name]  has presented today for surgery, with the diagnosis of status post mastectomy, history of breast cancer.  The various methods of treatment have been discussed with the patient and family. After consideration of risks, benefits and other options for treatment, the patient has consented to  Procedure(s) with comments: REMOVAL OF BILATERAL TISSUE EXPANDERS WITH PLACEMENT OF BILATERAL BREAST IMPLANTS (Bilateral) - 90 min, please as a surgical intervention.  The patient's history has been reviewed, patient examined, no change in status, stable for surgery.  I have reviewed the patient's chart and labs.  Questions were answered to the patient's satisfaction.     Loel Lofty Anely Spiewak

## 2019-09-26 NOTE — Progress Notes (Signed)
Sent Post Op medications

## 2019-09-26 NOTE — Discharge Instructions (Signed)
INSTRUCTIONS FOR AFTER SURGERY   You will likely have some questions about what to expect following your operation.  The following information will help you and your family understand what to expect when you are discharged from the hospital.  Following these guidelines will help ensure a smooth recovery and reduce risks of complications.  Postoperative instructions include information on: diet, wound care, medications and physical activity.  AFTER SURGERY Expect to go home after the procedure.  In some cases, you may need to spend one night in the hospital for observation.  DIET This surgery does not require a specific diet.  However, I have to mention that the healthier you eat the better your body can start healing. It is important to increasing your protein intake.  This means limiting the foods with added sugar.  Focus on fruits and vegetables and some meat.  If you have any liposuction during your procedure be sure to drink water.  If your urine is bright yellow, then it is concentrated, and you need to drink more water.  As a general rule after surgery, you should have 8 ounces of water every hour while awake.  If you find you are persistently nauseated or unable to take in liquids let us know.  NO TOBACCO USE or EXPOSURE.  This will slow your healing process and increase the risk of a wound.  WOUND CARE You can shower the day after surgery.  Use fragrance free soap.  Dial, [place], [place] and Cetaphil are usually mild on the skin.  If you have steri-strips / tape directly attached to your skin leave them in place. It is OK to get these wet.  No baths, pools or hot tubs for two weeks. We close your incision to leave the smallest and best-looking scar. No ointment or creams on your incisions until given the go ahead.  Especially not Neosporin (Too many skin reactions with this one).  A few weeks after surgery you can use Mederma and start massaging the scar. We ask you to wear your binder or sports  bra for the first 6 weeks around the clock, including while sleeping. This provides added comfort and helps reduce the fluid accumulation at the surgery site.  ACTIVITY No heavy lifting until cleared by the doctor.  It is OK to walk and climb stairs. In fact, moving your legs is very important to decrease your risk of a blood clot.  It will also help keep you from getting deconditioned.  Every 1 to 2 hours get up and walk for 5 minutes. This will help with a quicker recovery back to normal.  Let pain be your guide so you don't do too much.  NO, you cannot do the spring cleaning and don't plan on taking care of anyone else.  This is your time for TLC.   WORK Everyone returns to work at different times. As a rough guide, most people take at least 1 - 2 weeks off prior to returning to work. If you need documentation for your job, bring the forms to your postoperative follow up visit.  DRIVING Arrange for someone to bring you home from the hospital.  You may be able to drive a few days after surgery but not while taking any narcotics or valium.  BOWEL MOVEMENTS Constipation can occur after anesthesia and while taking pain medication.  It is important to stay ahead for your comfort.  We recommend taking Milk of Magnesia (2 tablespoons; twice a day) while taking the pain  pills.  SEROMA This is fluid your body tried to put in the surgical site.  This is normal but if it creates excessive pain and swelling let us know.  It usually decreases in a few weeks.  MEDICATIONS and PAIN CONTROL At your preoperative visit for you history and physical you were given the following medications: 1. An antibiotic: Start this medication when you get home and take according to the instructions on the bottle. 2. Zofran 4 mg:  This is to treat nausea and vomiting.  You can take this every 6 hours as needed and only if needed. 3. Norco (hydrocodone/acetaminophen) 5/325 mg:  This is only to be used after you have taken the  motrin or the tylenol. Every 8 hours as needed. Over the counter Medication to take: 4. Ibuprofen (Motrin) 600 mg:  Take this every 6 hours.  If you have additional pain then take 500 mg of the tylenol.  Only take the Norco after you have tried these two. 5. Miralax or stool softener of choice: Take this according to the bottle if you take the [place] Call your surgeon's office if any of the following occur: . Fever 101 degrees F or greater . Excessive bleeding or fluid from the incision site. . Pain that increases over time without aid from the medications . Redness, warmth, or pus draining from incision sites . Persistent nausea or inability to take in liquids . Severe misshapen area that underwent the operation.     Post Anesthesia Home Care Instructions  Activity: Get plenty of rest for the remainder of the day. A responsible individual must stay with you for 24 hours following the procedure.  For the next 24 hours, DO NOT: -Drive a car -Paediatric nurse -Drink alcoholic beverages -Take any medication unless instructed by your physician -Make any legal decisions or sign important papers.  Meals: Start with liquid foods such as gelatin or soup. Progress to regular foods as tolerated. Avoid greasy, spicy, heavy foods. If nausea and/or vomiting occur, drink only clear liquids until the nausea and/or vomiting subsides. Call your physician if vomiting continues.  Special Instructions/Symptoms: Your throat may feel dry or sore from the anesthesia or the breathing tube placed in your throat during surgery. If this causes discomfort, gargle with warm salt water. The discomfort should disappear within 24 hours.  If you had a scopolamine patch placed behind your ear for the management of post- operative nausea and/or vomiting:  1. The medication in the patch is effective for 72 hours, after which it should be removed.  Wrap patch in a tissue and discard in the trash. Wash  hands thoroughly with soap and water. 2. You may remove the patch earlier than 72 hours if you experience unpleasant side effects which may include dry mouth, dizziness or visual disturbances. 3. Avoid touching the patch. Wash your hands with soap and water after contact with the patch.

## 2019-09-26 NOTE — Op Note (Signed)
Op report Bilateral Exchange   DATE OF OPERATION: 09/26/2019  LOCATION: [place]  SURGICAL DIVISION: Plastic Surgery  PREOPERATIVE DIAGNOSES:  1.History of breast cancer.  2. Acquired absence of bilateral breast.   POSTOPERATIVE DIAGNOSES:  1. History of breast cancer.  2. Acquired absence of bilateral breast.   PROCEDURE:  1. Bilateral exchange of tissue expanders for implants.  2. Bilateral extensive capsulotomies for implant respositioning.  SURGEON: Teressa Mcglocklin Sanger March Joos, DO  ASSISTANT: Phoebe Sharps, PA  ANESTHESIA:  General.   COMPLICATIONS: None.   IMPLANTS: Left - Mentor Smooth Round Ultra High Profile Gel 455 cc. Ref #[id number].  Serial Number [id number] Right - Mentor Smooth Round Ultra High Profile Gel 455 cc. Ref #[id number].  Serial Number [id number]  INDICATIONS FOR PROCEDURE:  The patient, [name], is a 64 y.o. female born on 1955-12-29, is here for treatment after bilateral mastectomies.  She had tissue expanders placed at the time of mastectomies. She now presents for exchange of her expanders for implants.  She requires capsulotomies to better position the implants. MRN: [id number]  CONSENT:  Informed consent was obtained directly from the patient. Risks, benefits and alternatives were fully discussed. Specific risks including but not limited to bleeding, infection, hematoma, seroma, scarring, pain, implant infection, implant extrusion, capsular contracture, asymmetry, wound healing problems, and need for further surgery were all discussed. The patient did have an ample opportunity to have her questions answered to her satisfaction.   DESCRIPTION OF PROCEDURE:  The patient was taken to the operating room. SCDs were placed and IV antibiotics were given. The patient's chest was prepped and draped in a sterile fashion. A time out was performed and the implants to be used were identified.    On the right breast: One percent  Lidocaine with epinephrine was used to infiltrate at the incision site. The old mastectomy scar was incised.  The mastectomy flaps from the superior and inferior flaps were raised over the pectoralis major muscle for several centimeters to minimize tension for the closure. The pectoralis was split inferior to the skin incision at the ADM juncture to expose and remove the tissue expander.  Inspection of the pocket showed a normal healthy capsule and good integration of the biologic matrix.  The pocket was irrigated with antibiotic solution. I tried a 535 cc sizer which was not going to work at all.  I tried a 480 cc sizer and that was also too big.   Circumferential capsulotomies were performed to allow for breast pocket expansion.  Measurements were made and a sizer used to confirm adequate pocket size for the implant dimensions.  Hemostasis was ensured with electrocautery. New gloves were placed. The implant was soaked in antibiotic solution and then placed in the pocket and oriented appropriately. The pectoralis major muscle and capsule on the anterior surface were re-closed with a 3-0 Monocryl suture. The remaining skin was closed with 4-0 Monocryl deep dermal and 5-0 Monocryl subcuticular stitches.   On the left breast: The old mastectomy scar was incised.  The mastectomy flaps from the superior and inferior flaps were raised over the pectoralis major muscle for several centimeters to minimize tension for the closure. The pectoralis was split inferior to the skin incision at the ADM juncture to expose and remove the tissue expander.  Inspection of the pocket showed a normal healthy capsule and good integration of the biologic matrix.   Circumferential capsulotomies were performed to allow for breast pocket expansion.  Measurements were made and  a sizer utilized to confirm adequate pocket size for the implant dimensions.  Hemostasis was ensured with the electrocautery.  New gloves were applied. The implant  was soaked in antibiotic solution and placed in the pocket and oriented appropriately. The pectoralis major muscle and capsule on the anterior surface were re-closed with a 3-0 Monocryl suture. The remaining skin was closed with 4-0 Monocryl deep dermal and 5-0 Monocryl subcuticular stitches.  Dermabond was applied to the incision site. A breast binder and ABDs were placed.  The patient was allowed to wake from anesthesia and taken to the recovery room in satisfactory condition.   The advanced practice practitioner (APP) assisted throughout the case.  The APP was essential in retraction and counter traction when needed to make the case progress smoothly.  This retraction and assistance made it possible to see the tissue plans for the procedure.  The assistance was needed for blood control, tissue re-approximation and assisted with closure of the incision site.

## 2019-09-26 NOTE — Interval H&P Note (Signed)
History and Physical Interval Note:  09/26/2019 3:24 PM  [name]  has presented today for surgery, with the diagnosis of status post mastectomy, history of breast cancer.  The various methods of treatment have been discussed with the patient and family. After consideration of risks, benefits and other options for treatment, the patient has consented to  Procedure(s) with comments: REMOVAL OF BILATERAL TISSUE EXPANDERS WITH PLACEMENT OF BILATERAL BREAST IMPLANTS (Bilateral) - 90 min, please as a surgical intervention.  The patient's history has been reviewed, patient examined, no change in status, stable for surgery.  I have reviewed the patient's chart and labs.  Questions were answered to the patient's satisfaction.     Loel Lofty Yee Joss

## 2019-09-26 NOTE — Anesthesia Procedure Notes (Signed)
Procedure Name: LMA Insertion Date/Time: 09/26/2019 3:47 PM Performed by: Lavonia Dana, CRNA Pre-anesthesia Checklist: Patient identified, Emergency Drugs available, Suction available and Patient being monitored Patient Re-evaluated:Patient Re-evaluated prior to induction Oxygen Delivery Method: Circle system utilized Preoxygenation: Pre-oxygenation with 100% oxygen Induction Type: IV induction Ventilation: Mask ventilation without difficulty LMA: LMA inserted LMA Size: 4.0 Number of attempts: 1 Airway Equipment and Method: Bite block Placement Confirmation: positive ETCO2 Tube secured with: Tape Dental Injury: Teeth and Oropharynx as per pre-operative assessment

## 2019-09-26 NOTE — Anesthesia Preprocedure Evaluation (Addendum)
Anesthesia Evaluation  Patient identified by MRN, date of birth, ID band Patient awake    Reviewed: Allergy & Precautions, H&P , NPO status , Patient's Chart, lab work & pertinent test results  History of Anesthesia Complications (+) PONV and history of anesthetic complications  Airway Mallampati: II   Neck ROM: full    Dental   Pulmonary neg pulmonary ROS,    breath sounds clear to auscultation       Cardiovascular negative cardio ROS   Rhythm:regular Rate:Normal     Neuro/Psych    GI/Hepatic   Endo/Other    Renal/GU      Musculoskeletal  (+) Arthritis ,   Abdominal   Peds  Hematology   Anesthesia Other Findings   Reproductive/Obstetrics                             Anesthesia Physical Anesthesia Plan  ASA: II  Anesthesia Plan: General   Post-op Pain Management:    Induction: Intravenous  PONV Risk Score and Plan: 4 or greater and Ondansetron, Dexamethasone, Midazolam, Treatment may vary due to age or medical condition and Scopolamine patch - Pre-op  Airway Management Planned: Oral ETT  Additional Equipment:   Intra-op Plan:   Post-operative Plan: Extubation in OR  Informed Consent: I have reviewed the patients History and Physical, chart, labs and discussed the procedure including the risks, benefits and alternatives for the proposed anesthesia with the patient or authorized representative who has indicated his/her understanding and acceptance.       Plan Discussed with: CRNA, Anesthesiologist and Surgeon  Anesthesia Plan Comments:        Anesthesia Quick Evaluation

## 2019-09-26 NOTE — Interval H&P Note (Signed)
History and Physical Interval Note:  09/26/2019 3:24 PM  [name]  has presented today for surgery, with the diagnosis of status post mastectomy, history of breast cancer.  The various methods of treatment have been discussed with the patient and family. After consideration of risks, benefits and other options for treatment, the patient has consented to  Procedure(s) with comments: REMOVAL OF BILATERAL TISSUE EXPANDERS WITH PLACEMENT OF BILATERAL BREAST IMPLANTS (Bilateral) - 90 min, please as a surgical intervention.  The patient's history has been reviewed, patient examined, no change in status, stable for surgery.  I have reviewed the patient's chart and labs.  Questions were answered to the patient's satisfaction.     Loel Lofty Nyzier Boivin

## 2019-09-26 NOTE — Transfer of Care (Signed)
Immediate Anesthesia Transfer of Care Note  Patient: [name]  Procedure(s) Performed: REMOVAL OF BILATERAL TISSUE EXPANDERS WITH PLACEMENT OF BILATERAL BREAST IMPLANTS (Bilateral Breast)  Patient Location: PACU  Anesthesia Type:General  Level of Consciousness: drowsy and patient cooperative  Airway & Oxygen Therapy: Patient Spontanous Breathing and Patient connected to face mask oxygen  Post-op Assessment: Report given to RN and Post -op Vital signs reviewed and stable  Post vital signs: Reviewed and stable  Last Vitals:  Vitals Value Taken Time  BP 107/68 09/26/19 1735  Temp 36.4 C 09/26/19 1735  Pulse 75 09/26/19 1736  Resp 16 09/26/19 1736  SpO2 100 % 09/26/19 1736    Last Pain:  Vitals:   09/26/19 1325  TempSrc: Oral  PainSc: 0-No pain         Complications: No complications documented.

## 2019-09-27 NOTE — Anesthesia Postprocedure Evaluation (Signed)
Anesthesia Post Note  Patient: Daphne Karrer  Procedure(s) Performed: REMOVAL OF BILATERAL TISSUE EXPANDERS WITH PLACEMENT OF BILATERAL BREAST IMPLANTS (Bilateral Breast)     Patient location during evaluation: PACU Anesthesia Type: General Level of consciousness: awake and alert Pain management: pain level controlled Vital Signs Assessment: post-procedure vital signs reviewed and stable Respiratory status: spontaneous breathing, nonlabored ventilation, respiratory function stable and patient connected to nasal cannula oxygen Cardiovascular status: blood pressure returned to baseline and stable Postop Assessment: no apparent nausea or vomiting Anesthetic complications: no   No complications documented.  Last Vitals:  Vitals:   09/26/19 1825 09/26/19 1830  BP:  (!) 123/88  Pulse: 91 85  Resp: 12 20  Temp:  (!) 36.4 C  SpO2: 98% 99%    Last Pain:  Vitals:   09/26/19 1830  TempSrc:   PainSc: 5                  Lillias Difrancesco S

## 2019-09-28 ENCOUNTER — Encounter (HOSPITAL_BASED_OUTPATIENT_CLINIC_OR_DEPARTMENT_OTHER): Payer: Self-pay | Admitting: Plastic Surgery

## 2019-09-30 DIAGNOSIS — Z9889 Other specified postprocedural states: Secondary | ICD-10-CM | POA: Insufficient documentation

## 2019-09-30 NOTE — Progress Notes (Signed)
Subjective:     Patient ID: [name], female    DOB: 07/01/1955, 64 y.o.   MRN: [id number]  Chief Complaint  Patient presents with  . Post-op Follow-up    HPI: The patient is a 64 y.o. female here for follow-up after undergoing bilateral breast reconstruction with removal of tissue expanders and placement of silicone implants. Mentor Smooth Round Ultra High Profile Gel 455 cc implants were placed bilaterally.  Patient reports she is doing well today.  Mild pain right>>left. She has been using ibuprofen and Tylenol occassionally.    Review of Systems  Constitutional: Negative for chills and fever.  Respiratory: Negative for shortness of breath.   Cardiovascular: Negative for chest pain.  Gastrointestinal: Negative for constipation, diarrhea, nausea and vomiting.  Skin: Negative for color change, pallor and rash.     Objective:   Vital Signs BP 126/86   Pulse 85   Temp 98.1 F (36.7 C) (Oral)   SpO2 98%  Vital Signs and Nursing Note Reviewed  Physical Exam Constitutional:      General: She is not in acute distress.    Appearance: Normal appearance. She is normal weight. She is not ill-appearing.  HENT:     Head: Normocephalic and atraumatic.  Eyes:     Extraocular Movements: Extraocular movements intact.  Cardiovascular:     Rate and Rhythm: Normal rate.  Pulmonary:     Effort: Pulmonary effort is normal.  Chest:       Comments: Honeycomb dressing in place; removed today. Bilateral incisions intact, clean and dry. Steristrips in place. No signs of infection, redness, drainage, seroma/hematoma.  Breasts sit a bit lateral bilaterally.  Musculoskeletal:        General: Normal range of motion.     Cervical back: Normal range of motion.  Skin:    General: Skin is warm and dry.     Coloration: Skin is not pale.     Findings: No erythema or rash.  Neurological:     Mental Status: She is alert and oriented to person, place, and time.     Gait: Gait is  intact.  Psychiatric:        Mood and Affect: Mood and affect normal.        Behavior: Behavior normal.        Thought Content: Thought content normal.        Cognition and Memory: Memory normal.        Judgment: Judgment normal.       Assessment/Plan:     ICD-10-CM   1. S/P breast reconstruction, bilateral  Z98.890     [name] is doing very well today.  Incisions are healing nicely, C/D/I.  Steri-Strips are in place.  Honeycomb dressing was removed today.  No signs of infection, redness, drainage, seroma/hematoma.  Overall she is pleased with her results, but would like a little more fullness medially as a sit a bit lateral bilaterally.  She will give it some time to heal and discuss options with Dr. Marla Waters in approximately a month.  May shower.  Continue to wear sports bra 24/7 for 6 weeks.  Do daily massage pushing gently downward and inward.  Continue to avoid heavy lifting until at least 4 weeks postop and then increase gradually.  Follow-up appointment scheduled for early September with Dr. Marla Waters.  Call office with any questions/concerns.  Pictures were obtained of the patient and placed in the chart with the patient's or guardian's permission.  The  [place] was signed into law in 2016 which includes the topic of electronic health records.  This provides immediate access to information in MyChart.  This includes consultation notes, operative notes, office notes, lab results and pathology reports.  If you have any questions about what you read please let us know at your next visit or call us at the office.  We are right here with you.   Threasa Heads, PA-C 10/02/2019, 9:18 AM

## 2019-10-02 ENCOUNTER — Other Ambulatory Visit: Payer: Self-pay

## 2019-10-02 ENCOUNTER — Encounter: Payer: Self-pay | Admitting: Plastic Surgery

## 2019-10-02 ENCOUNTER — Ambulatory Visit (INDEPENDENT_AMBULATORY_CARE_PROVIDER_SITE_OTHER): Payer: BC Managed Care – PPO | Admitting: Plastic Surgery

## 2019-10-02 ENCOUNTER — Encounter: Payer: BC Managed Care – PPO | Admitting: Plastic Surgery

## 2019-10-02 DIAGNOSIS — Z9889 Other specified postprocedural states: Secondary | ICD-10-CM

## 2019-10-06 NOTE — H&P (Signed)
[name] is an 64 y.o. female.   Chief Complaint: Acquired absence of bilateral breasts HPI: The patient is a 64 year old female here for treatment of her acquired absence of breast.  She had breast cancer was treated with mastectomies bilaterally.  She has been undergoing fills in her expanders.  She came to the office recently with concerns about a possible leak in her right breast expander.  On exam this is certainly possible but not clear.  Decision was made to move ahead with exchange she has approximately 495 and 515 cc in her right and left expander respectively.  Past Medical History:  Diagnosis Date  . Arthritis   . Backache, unspecified   . Cancer [facility])    breast s/p BL mastectomies  . Colon polyps   . Hyperlipidemia   . PONV (postoperative nausea and vomiting)     Past Surgical History:  Procedure Laterality Date  . BREAST RECONSTRUCTION WITH PLACEMENT OF TISSUE EXPANDER AND FLEX HD (ACELLULAR HYDRATED DERMIS) Bilateral 04/05/2019   Procedure: BILATERAL BREAST RECONSTRUCTION WITH PLACEMENT OF TISSUE EXPANDER AND FLEX HD (ACELLULAR HYDRATED DERMIS);  Surgeon: Wallace Going, DO;  Location: [place];  Service: Plastics;  Laterality: Bilateral;  . ENDOMETRIAL ABLATION  1998  . KNEE SURGERY Right 2007  . MASTECTOMY W/ SENTINEL NODE BIOPSY Bilateral 04/05/2019   Procedure: BILATERAL MASTECTOMIES WITH RIGHT SENTINEL LYMPH NODE BIOPSY;  Surgeon: [name], MD;  Location: [place];  Service: General;  Laterality: Bilateral;  . REMOVAL OF BILATERAL TISSUE EXPANDERS WITH PLACEMENT OF BILATERAL BREAST IMPLANTS Bilateral 09/26/2019   Procedure: REMOVAL OF BILATERAL TISSUE EXPANDERS WITH PLACEMENT OF BILATERAL BREAST IMPLANTS;  Surgeon: Wallace Going, DO;  Location: [place];  Service: Plastics;  Laterality: Bilateral;  . RHINOPLASTY  1977  . TONSILLECTOMY  1961    Family History  Problem Relation Age of Onset   . Arthritis Mother   . Cancer Mother        Colon   . Melanoma Father   . Cancer Maternal Grandmother        Breast   . Colon cancer Maternal Grandmother   . Cancer Maternal Grandfather        Lung (Smoker)   . Hyperlipidemia Paternal Grandmother   . Cancer Paternal Grandfather        Lung (Smoker)    Social History:  reports that she has never smoked. She has never used smokeless tobacco. She reports current alcohol use. She reports that she does not use drugs.  Allergies: No Known Allergies  No medications prior to admission.    No results found for this or any previous visit (from the past 48 hour(s)). No results found.  Review of Systems  Constitutional: Negative.   HENT: Negative.   Eyes: Negative.   Respiratory: Negative.   Cardiovascular: Negative.   Gastrointestinal: Negative.   Endocrine: Negative.   Genitourinary: Negative.   Musculoskeletal: Negative.   Skin: Negative.   Hematological: Negative.   Psychiatric/Behavioral: Negative.     Blood pressure (!) 123/88, pulse 85, temperature (!) 97.5 F (36.4 C), resp. rate 20, height 5\' 7"  (1.702 m), weight 69.2 kg, SpO2 99 %. Physical Exam Vitals and nursing note reviewed.  Constitutional:      Appearance: Normal appearance.  HENT:     Head: Normocephalic and atraumatic.  Eyes:     Extraocular Movements: Extraocular movements intact.  Cardiovascular:     Rate and Rhythm: Normal rate.  Pulses: Normal pulses.  Pulmonary:     Effort: Pulmonary effort is normal. No respiratory distress.  Abdominal:     General: Abdomen is flat. There is no distension.  Skin:    General: Skin is warm.  Neurological:     General: No focal deficit present.     Mental Status: She is alert.  Psychiatric:        Mood and Affect: Mood normal.        Behavior: Behavior normal.        Thought Content: Thought content normal.      Assessment/Plan Acquired absence of bilateral breasts.  Patient is ready for exchange.  She  understands that she will likely be smaller.  Her skin is very tight and then.  We will not be able to make her any larger without additional surgery for muscle flaps.  She would like to proceed with surgery.  [place], DO 10/06/2019, 8:43 PM

## 2019-10-26 ENCOUNTER — Encounter: Payer: BC Managed Care – PPO | Admitting: Plastic Surgery

## 2019-11-05 ENCOUNTER — Encounter: Payer: Self-pay | Admitting: Plastic Surgery

## 2019-11-05 NOTE — Progress Notes (Signed)
   Subjective:    Patient ID: [name], female    DOB: August 22, 1955, 64 y.o.   MRN: [id number]  The patient is a 64 year old female here for follow-up after undergoing bilateral breast expander removal and placement of implants 09/26/2019.  She has Mentor smooth round ultra high profile gel 455 cc implant placed bilaterally.  Overall she is doing well.  She is pleased with her results.  The right side on the upper lateral breast has some capsule scarring.  This is also a little bit tender.  She has been doing some massage which seems to help.  She is interested in nipple areola tattooing.  She has some volume loss in the upper medial aspect of both breasts.     Review of Systems  Constitutional: Negative.   HENT: Negative.   Eyes: Negative.   Respiratory: Negative.   Cardiovascular: Negative.   Endocrine: Negative.   Genitourinary: Negative.        Objective:   Physical Exam Vitals and nursing note reviewed.  Constitutional:      Appearance: Normal appearance.  Cardiovascular:     Rate and Rhythm: Normal rate.     Pulses: Normal pulses.  Pulmonary:     Effort: Pulmonary effort is normal.  Neurological:     General: No focal deficit present.     Mental Status: She is alert and oriented to person, place, and time.  Psychiatric:        Mood and Affect: Mood normal.        Behavior: Behavior normal.        Assessment & Plan:     ICD-10-CM   1. Ductal carcinoma in situ (DCIS) of right breast  D05.11   2. S/P breast reconstruction, bilateral  [id number]     Plan for nipple areola tattoo in January with [facility].  Recommend bilateral lipofilling of both breasts for improved symmetry.

## 2019-11-06 ENCOUNTER — Encounter: Payer: Self-pay | Admitting: Plastic Surgery

## 2019-11-06 ENCOUNTER — Encounter: Payer: BC Managed Care – PPO | Admitting: Plastic Surgery

## 2019-11-06 ENCOUNTER — Ambulatory Visit (INDEPENDENT_AMBULATORY_CARE_PROVIDER_SITE_OTHER): Payer: BC Managed Care – PPO | Admitting: Plastic Surgery

## 2019-11-06 ENCOUNTER — Other Ambulatory Visit: Payer: Self-pay

## 2019-11-06 VITALS — BP 133/78 | HR 72 | Temp 98.5°F

## 2019-11-06 DIAGNOSIS — D0511 Intraductal carcinoma in situ of right breast: Secondary | ICD-10-CM

## 2019-11-06 DIAGNOSIS — Z9889 Other specified postprocedural states: Secondary | ICD-10-CM

## 2019-11-19 ENCOUNTER — Other Ambulatory Visit (HOSPITAL_COMMUNITY): Payer: BC Managed Care – PPO

## 2019-11-27 ENCOUNTER — Encounter: Payer: BC Managed Care – PPO | Admitting: Plastic Surgery

## 2019-12-04 ENCOUNTER — Encounter: Payer: BC Managed Care – PPO | Admitting: Plastic Surgery

## 2019-12-24 ENCOUNTER — Telehealth: Payer: Self-pay | Admitting: Plastic Surgery

## 2019-12-24 NOTE — Telephone Encounter (Signed)
Patient found out that she has a regional meeting on 12/9 & 12/10 and wanted to know if she would be okay to travel. Her sx is scheduled for 12/1. Please call her to advise if she should be okay to travel post lipo/contouring after mastectomy surgery.

## 2019-12-25 NOTE — Telephone Encounter (Signed)
Returned patients call, LMVM. Per instructions from [facility], PA-Patient is ok to travel on 02/07/20 and 02/08/20. However, she will need to reschedule her PO follow up on 02/08/20 after her trip.

## 2020-01-08 ENCOUNTER — Ambulatory Visit (INDEPENDENT_AMBULATORY_CARE_PROVIDER_SITE_OTHER): Payer: BC Managed Care – PPO | Admitting: Surgical

## 2020-01-08 ENCOUNTER — Other Ambulatory Visit: Payer: Self-pay

## 2020-01-08 ENCOUNTER — Encounter: Payer: Self-pay | Admitting: Surgical

## 2020-01-08 VITALS — BP 149/97 | HR 71 | Temp 98.0°F | Ht 67.0 in | Wt 144.6 lb

## 2020-01-08 DIAGNOSIS — Z9013 Acquired absence of bilateral breasts and nipples: Secondary | ICD-10-CM

## 2020-01-08 DIAGNOSIS — Z9889 Other specified postprocedural states: Secondary | ICD-10-CM

## 2020-01-08 MED ORDER — ONDANSETRON HCL 4 MG PO TABS: 4.0000 mg | ORAL_TABLET | Freq: Three times a day (TID) | ORAL | 0 refills | Status: DC | PRN

## 2020-01-08 MED ORDER — CEPHALEXIN 500 MG PO CAPS: 500.0000 mg | ORAL_CAPSULE | Freq: Four times a day (QID) | ORAL | 0 refills | Status: AC

## 2020-01-08 MED ORDER — HYDROCODONE-ACETAMINOPHEN 5-325 MG PO TABS: 1.0000 | ORAL_TABLET | Freq: Four times a day (QID) | ORAL | 0 refills | Status: AC | PRN

## 2020-01-08 NOTE — Progress Notes (Signed)
Patient ID: [name], female    DOB: [birth year], 64 y.o.   MRN: [id number]  Chief Complaint  Patient presents with  . Pre-op Exam      ICD-10-CM   1. S/P breast reconstruction, bilateral  Z98.890   2. S/P mastectomy, bilateral  Z90.13    History of Present Illness: [name] is a 64 y.o.  female  with a history of right-sided ductal carcinoma in situ.  She underwent bilateral mastectomies by Dr. Marlou Starks followed by bilateral breast reconstruction with Dr. Marla Roe on 04/05/2019, she subsequently had placement of bilateral breast implants on 09/26/2019 by Dr. Marla Roe.  She presents for preoperative evaluation for upcoming procedure, liposuction of abdomen and Lipo filling of bilateral breasts, scheduled for 01/30/2020 with Dr. Marla Roe  The patient has not had problems with anesthesia. No history of DVT/PE. No family or personal history of bleeding or clotting disorders.  Patient is not currently taking any blood thinners.  No history of CVA/MI.   Patient has a history of postoperative nausea and vomiting Reports her mother has a history of a DVT while on OCPs.  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  Past Medical History: Allergies: No Known Allergies  Current Medications:  Current Outpatient Medications:  .  diazepam (VALIUM) 2 MG tablet, Take 1 tablet (2 mg total) by mouth every 12 (twelve) hours as needed for muscle spasms., Disp: 20 tablet, Rfl: 0 .  famotidine (PEPCID) 40 MG tablet, Take 40 mg by mouth daily., Disp: , Rfl:  .  Multiple Vitamin (MULTI-VITAMIN DAILY PO), Take by mouth., Disp: , Rfl:  .  cephALEXin (KEFLEX) 500 MG capsule, Take 1 capsule (500 mg total) by mouth 4 (four) times daily for 5 days., Disp: 20 capsule, Rfl: 0 .  Cholecalciferol (VITAMIN D-3) 125 MCG (5000 UT) TABS, Take by mouth daily. (Patient not taking: Reported on 01/08/2020), Disp: , Rfl:  .  HYDROcodone-acetaminophen (NORCO) 5-325 MG tablet,  Take 1 tablet by mouth every 6 (six) hours as needed for up to 5 days for severe pain., Disp: 20 tablet, Rfl: 0 .  omega-3 acid ethyl esters (LOVAZA) 1 g capsule, Take 1 g by mouth 2 (two) times daily. (Patient not taking: Reported on 01/08/2020), Disp: , Rfl:  .  ondansetron (ZOFRAN) 4 MG tablet, Take 1 tablet (4 mg total) by mouth every 8 (eight) hours as needed for nausea or vomiting., Disp: 20 tablet, Rfl: 0  Past Medical Problems: Past Medical History:  Diagnosis Date  . Arthritis   . Backache, unspecified   . Cancer [facility])    breast s/p BL mastectomies  . Colon polyps   . Hyperlipidemia   . PONV (postoperative nausea and vomiting)     Past Surgical History: Past Surgical History:  Procedure Laterality Date  . BREAST RECONSTRUCTION WITH PLACEMENT OF TISSUE EXPANDER AND FLEX HD (ACELLULAR HYDRATED DERMIS) Bilateral 04/05/2019   Procedure: BILATERAL BREAST RECONSTRUCTION WITH PLACEMENT OF TISSUE EXPANDER AND FLEX HD (ACELLULAR HYDRATED DERMIS);  Surgeon: Wallace Going, DO;  Location: [place];  Service: Plastics;  Laterality: Bilateral;  . ENDOMETRIAL ABLATION  1998  . KNEE SURGERY Right 2007  . MASTECTOMY W/ SENTINEL NODE BIOPSY Bilateral 04/05/2019   Procedure: BILATERAL MASTECTOMIES WITH RIGHT SENTINEL LYMPH NODE BIOPSY;  Surgeon: [name], MD;  Location: [place];  Service: General;  Laterality: Bilateral;  . REMOVAL OF BILATERAL TISSUE EXPANDERS WITH PLACEMENT OF BILATERAL BREAST IMPLANTS  Bilateral 09/26/2019   Procedure: REMOVAL OF BILATERAL TISSUE EXPANDERS WITH PLACEMENT OF BILATERAL BREAST IMPLANTS;  Surgeon: Wallace Going, DO;  Location: [place];  Service: Plastics;  Laterality: Bilateral;  . RHINOPLASTY  1977  . TONSILLECTOMY  1961    Social History: Social History   Socioeconomic History  . Marital status: Married    Spouse name: Harrie Jeans  . Number of children: 3  . Years of education: 16+  . Highest  education level: Not on file  Occupational History    Employer: [employer]  Tobacco Use  . Smoking status: Never Smoker  . Smokeless tobacco: Never Used  Vaping Use  . Vaping Use: Never used  Substance and Sexual Activity  . Alcohol use: Yes    Comment: 2-3/week  . Drug use: No  . Sexual activity: Yes    Partners: Male  Other Topics Concern  . Not on file  Social History Narrative   Marital Status:  Married Psychologist, counselling)    Children:  G3 P3 Daughters (Lauren, Sherran Needs, [place])    Pets:  Poodle [facility]); Cats Eduard Clos, Shirlee Limer[place])    Living Situation: Lives with spouse.  Her daughters live in [place].    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 Difficulty of Paying Living Expenses: Not on file  Food Insecurity:   . Worried About Charity fundraiser in the Last Year: Not on file  . Ran Out of Food in the Last Year: Not on file  Transportation Needs:   . Lack of Transportation (Medical): Not on file  . Lack of Transportation (Non-Medical): Not on file  Physical Activity:   . Days of Exercise per Week: Not on file  . Minutes of Exercise per Session: Not on file  Stress:   . Feeling of Stress : Not on file  Social Connections:   . Frequency of Communication with Friends and Family: Not on file  . Frequency of Social Gatherings with Friends and Family: Not on file  . Attends Religious Services: Not on file  . Active Member of Clubs or Organizations: Not on file  . Attends Archivist Meetings: Not on file  . Marital Status: Not on file  Intimate Partner Violence:   . Fear of Current or Ex-Partner: Not on file  . Emotionally  Abused: Not on file  . Physically Abused: Not on file  . Sexually Abused: Not on file    Family History: Family History  Problem Relation Age of Onset  . Arthritis Mother   . Cancer Mother        Colon   . Melanoma Father   . Cancer Maternal Grandmother        Breast   . Colon cancer Maternal Grandmother   . Cancer Maternal Grandfather        Lung (Smoker)   . Hyperlipidemia Paternal Grandmother   . Cancer Paternal Grandfather        Lung (Smoker)     Review of Systems: Review of Systems  Constitutional: Negative.  Respiratory: Negative.   Cardiovascular: Negative.   Gastrointestinal: Negative.   Musculoskeletal: Negative.     Physical Exam: Vital Signs BP (!) 149/97 (BP Location: Left Arm, Patient Position: Sitting, Cuff Size: Large)   Pulse 71   Temp 98 F (36.7 C) (Oral)   Ht 5\' 7"  (1.702 m)   Wt 144 lb 9.6 oz (65.6 kg)   SpO2 100%   BMI 22.65 kg/m  Physical Exam Exam conducted with a chaperone present.  Constitutional:      General: She is not in acute distress.    Appearance: Normal appearance. She is not ill-appearing.  HENT:     Head: Normocephalic and atraumatic.  Eyes:     Pupils: Pupils are equal, round Neck:     Musculoskeletal: Normal range of motion.  Cardiovascular:     Rate and Rhythm: Normal rate and regular rhythm.     Pulses: Normal pulses.     Heart sounds: Normal heart sounds. No murmur.  Pulmonary:     Effort: Pulmonary effort is normal. No respiratory distress.     Breath sounds: Normal breath sounds. No wheezing.  Abdominal:     General: Abdomen is flat. There is no distension.     Palpations: Abdomen is soft.     Tenderness: There is no abdominal tenderness.  Musculoskeletal: Normal range of motion.  Skin:    General: Skin is warm and dry.     Findings: No erythema or rash.  Neurological:     General: No focal deficit present.     Mental Status: She is alert and oriented to person, place, and time. Mental status is at  baseline.     Motor: No weakness.  Psychiatric:        Mood and Affect: Mood normal.        Behavior: Behavior normal.    Assessment/Plan: The patient is scheduled for liposuction of abdomen for Lipo filling of bilateral breasts with Dr. Marla Roe.  Risks, benefits, and alternatives of procedure discussed, questions answered and consent obtained.  We discussed the patient's anatomy is a limiting factor for her having medial breast fullness.  The implants were not able to be placed more proximal along the midline region as she has a fairly wide sternum.  Patient is aware of this and understands limitations due to this.  She is also interested in having the scarring along the right lateral superior breast broken up as it is causing her some pain.  Smoking Status: Non-smoker; Counseling Given?  N/A  Caprini Score: 9, high risk; Risk Factors include: Age, mother with a history of DVT while on OCP, history of breast cancer, and length of planned surgery. Recommendation for mechanical and pharmacological prophylaxis. Encourage early ambulation.   Pictures obtained: 11/06/2019  Post-op Rx sent to pharmacy: Norco, Zofran, Keflex  Patient was provided with the  General Surgical Risk consent document and Pain Medication Agreement prior to their appointment.  They had adequate time to read through the risk consent documents and Pain Medication Agreement. We also discussed them in person together during this preop appointment. All of their questions were answered to their satisfaction.  Recommended calling if they have any further questions.  Risk consent form and Pain Medication Agreement to be scanned into patient's chart.  The risks that can be encountered with and after liposuction were discussed and include the following but no limited to these:  Asymmetry, fluid accumulation, firmness of the area, fat necrosis with death of fat tissue, bleeding, infection,  delayed healing, anesthesia risks, skin  sensation changes, injury to structures including nerves, blood vessels, and muscles which may be temporary or permanent, allergies to tape, suture materials and glues, blood products, topical preparations or injected agents, skin and contour irregularities, skin discoloration and swelling, deep vein thrombosis, cardiac and pulmonary complications, pain, which may persist, persistent pain, recurrence of the lesion, poor healing of the incision, possible need for revisional surgery or staged procedures. Thiere can also be persistent swelling, poor wound healing, rippling or loose skin, worsening of cellulite, swelling, and thermal burn or heat injury from ultrasound with the ultrasound-assisted lipoplasty technique. Any change in weight fluctuations can alter the outcome.  Electronically signed by: Carola Rhine Hugh Kamara, PA-C 01/08/2020 3:41 PM

## 2020-01-08 NOTE — H&P (View-Only) (Signed)
Patient ID: [name], female    DOB: 04-Jun-1955, 64 y.o.   MRN: [id number]  Chief Complaint  Patient presents with  . Pre-op Exam      ICD-10-CM   1. S/P breast reconstruction, bilateral  Z98.890   2. S/P mastectomy, bilateral  Z90.13    History of Present Illness: [name] is a 64 y.o.  female  with a history of right-sided ductal carcinoma in situ.  She underwent bilateral mastectomies by Dr. Marlou Starks followed by bilateral breast reconstruction with Dr. Marla Roe on 04/05/2019, she subsequently had placement of bilateral breast implants on 09/26/2019 by Dr. Marla Roe.  She presents for preoperative evaluation for upcoming procedure, liposuction of abdomen and Lipo filling of bilateral breasts, scheduled for 01/30/2020 with Dr. Marla Roe  The patient has not had problems with anesthesia. No history of DVT/PE. No family or personal history of bleeding or clotting disorders.  Patient is not currently taking any blood thinners.  No history of CVA/MI.   Patient has a history of postoperative nausea and vomiting Reports her mother has a history of a DVT while on OCPs.  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  Past Medical History: Allergies: No Known Allergies  Current Medications:  Current Outpatient Medications:  .  diazepam (VALIUM) 2 MG tablet, Take 1 tablet (2 mg total) by mouth every 12 (twelve) hours as needed for muscle spasms., Disp: 20 tablet, Rfl: 0 .  famotidine (PEPCID) 40 MG tablet, Take 40 mg by mouth daily., Disp: , Rfl:  .  Multiple Vitamin (MULTI-VITAMIN DAILY PO), Take by mouth., Disp: , Rfl:  .  cephALEXin (KEFLEX) 500 MG capsule, Take 1 capsule (500 mg total) by mouth 4 (four) times daily for 5 days., Disp: 20 capsule, Rfl: 0 .  Cholecalciferol (VITAMIN D-3) 125 MCG (5000 UT) TABS, Take by mouth daily. (Patient not taking: Reported on 01/08/2020), Disp: , Rfl:  .  HYDROcodone-acetaminophen (NORCO) 5-325 MG tablet,  Take 1 tablet by mouth every 6 (six) hours as needed for up to 5 days for severe pain., Disp: 20 tablet, Rfl: 0 .  omega-3 acid ethyl esters (LOVAZA) 1 g capsule, Take 1 g by mouth 2 (two) times daily. (Patient not taking: Reported on 01/08/2020), Disp: , Rfl:  .  ondansetron (ZOFRAN) 4 MG tablet, Take 1 tablet (4 mg total) by mouth every 8 (eight) hours as needed for nausea or vomiting., Disp: 20 tablet, Rfl: 0  Past Medical Problems: Past Medical History:  Diagnosis Date  . Arthritis   . Backache, unspecified   . Cancer [facility])    breast s/p BL mastectomies  . Colon polyps   . Hyperlipidemia   . PONV (postoperative nausea and vomiting)     Past Surgical History: Past Surgical History:  Procedure Laterality Date  . BREAST RECONSTRUCTION WITH PLACEMENT OF TISSUE EXPANDER AND FLEX HD (ACELLULAR HYDRATED DERMIS) Bilateral 04/05/2019   Procedure: BILATERAL BREAST RECONSTRUCTION WITH PLACEMENT OF TISSUE EXPANDER AND FLEX HD (ACELLULAR HYDRATED DERMIS);  Surgeon: Wallace Going, DO;  Location: [place];  Service: Plastics;  Laterality: Bilateral;  . ENDOMETRIAL ABLATION  1998  . KNEE SURGERY Right 2007  . MASTECTOMY W/ SENTINEL NODE BIOPSY Bilateral 04/05/2019   Procedure: BILATERAL MASTECTOMIES WITH RIGHT SENTINEL LYMPH NODE BIOPSY;  Surgeon: [name], MD;  Location: [place];  Service: General;  Laterality: Bilateral;  . REMOVAL OF BILATERAL TISSUE EXPANDERS WITH PLACEMENT OF BILATERAL BREAST IMPLANTS  Bilateral 09/26/2019   Procedure: REMOVAL OF BILATERAL TISSUE EXPANDERS WITH PLACEMENT OF BILATERAL BREAST IMPLANTS;  Surgeon: Wallace Going, DO;  Location: [place];  Service: Plastics;  Laterality: Bilateral;  . RHINOPLASTY  1977  . TONSILLECTOMY  1961    Social History: Social History   Socioeconomic History  . Marital status: Married    Spouse name: [name]  . Number of children: 3  . Years of education: 16+  . Highest  education level: Not on file  Occupational History    Employer: [employer]  Tobacco Use  . Smoking status: Never Smoker  . Smokeless tobacco: Never Used  Vaping Use  . Vaping Use: Never used  Substance and Sexual Activity  . Alcohol use: Yes    Comment: 2-3/week  . Drug use: No  . Sexual activity: Yes    Partners: Male  Other Topics Concern  . Not on file  Social History Narrative   Marital Status:  Married Psychologist, counselling)    Children:  G3 P3 Daughters (Lauren, Sherran Needs, [place])    Pets:  Poodle [facility]); Cats Eduard Clos, Shirlee Limerick)    Living Situation: Lives with spouse.  Her daughters live in [place].    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 Difficulty of Paying Living Expenses: Not on file  Food Insecurity:   . Worried About Charity fundraiser in the Last Year: Not on file  . Ran Out of Food in the Last Year: Not on file  Transportation Needs:   . Lack of Transportation (Medical): Not on file  . Lack of Transportation (Non-Medical): Not on file  Physical Activity:   . Days of Exercise per Week: Not on file  . Minutes of Exercise per Session: Not on file  Stress:   . Feeling of Stress : Not on file  Social Connections:   . Frequency of Communication with Friends and Family: Not on file  . Frequency of Social Gatherings with Friends and Family: Not on file  . Attends Religious Services: Not on file  . Active Member of Clubs or Organizations: Not on file  . Attends Archivist Meetings: Not on file  . Marital Status: Not on file  Intimate Partner Violence:   . Fear of Current or Ex-Partner: Not on file  . Emotionally  Abused: Not on file  . Physically Abused: Not on file  . Sexually Abused: Not on file    Family History: Family History  Problem Relation Age of Onset  . Arthritis Mother   . Cancer Mother        Colon   . Melanoma Father   . Cancer Maternal Grandmother        Breast   . Colon cancer Maternal Grandmother   . Cancer Maternal Grandfather        Lung (Smoker)   . Hyperlipidemia Paternal Grandmother   . Cancer Paternal Grandfather        Lung (Smoker)     Review of Systems: Review of Systems  Constitutional: Negative.  Respiratory: Negative.   Cardiovascular: Negative.   Gastrointestinal: Negative.   Musculoskeletal: Negative.     Physical Exam: Vital Signs BP (!) 149/97 (BP Location: Left Arm, Patient Position: Sitting, Cuff Size: Large)   Pulse 71   Temp 98 F (36.7 C) (Oral)   Ht 5\' 7"  (1.702 m)   Wt 144 lb 9.6 oz (65.6 kg)   SpO2 100%   BMI 22.65 kg/m  Physical Exam Exam conducted with a chaperone present.  Constitutional:      General: She is not in acute distress.    Appearance: Normal appearance. She is not ill-appearing.  HENT:     Head: Normocephalic and atraumatic.  Eyes:     Pupils: Pupils are equal, round Neck:     Musculoskeletal: Normal range of motion.  Cardiovascular:     Rate and Rhythm: Normal rate and regular rhythm.     Pulses: Normal pulses.     Heart sounds: Normal heart sounds. No murmur.  Pulmonary:     Effort: Pulmonary effort is normal. No respiratory distress.     Breath sounds: Normal breath sounds. No wheezing.  Abdominal:     General: Abdomen is flat. There is no distension.     Palpations: Abdomen is soft.     Tenderness: There is no abdominal tenderness.  Musculoskeletal: Normal range of motion.  Skin:    General: Skin is warm and dry.     Findings: No erythema or rash.  Neurological:     General: No focal deficit present.     Mental Status: She is alert and oriented to person, place, and time. Mental status is at  baseline.     Motor: No weakness.  Psychiatric:        Mood and Affect: Mood normal.        Behavior: Behavior normal.    Assessment/Plan: The patient is scheduled for liposuction of abdomen for Lipo filling of bilateral breasts with Dr. Marla Roe.  Risks, benefits, and alternatives of procedure discussed, questions answered and consent obtained.  We discussed the patient's anatomy is a limiting factor for her having medial breast fullness.  The implants were not able to be placed more proximal along the midline region as she has a fairly wide sternum.  Patient is aware of this and understands limitations due to this.  She is also interested in having the scarring along the right lateral superior breast broken up as it is causing her some pain.  Smoking Status: Non-smoker; Counseling Given?  N/A  Caprini Score: 9, high risk; Risk Factors include: Age, mother with a history of DVT while on OCP, history of breast cancer, and length of planned surgery. Recommendation for mechanical and pharmacological prophylaxis. Encourage early ambulation.   Pictures obtained: 11/06/2019  Post-op Rx sent to pharmacy: Norco, Zofran, Keflex  Patient was provided with the  General Surgical Risk consent document and Pain Medication Agreement prior to their appointment.  They had adequate time to read through the risk consent documents and Pain Medication Agreement. We also discussed them in person together during this preop appointment. All of their questions were answered to their satisfaction.  Recommended calling if they have any further questions.  Risk consent form and Pain Medication Agreement to be scanned into patient's chart.  The risks that can be encountered with and after liposuction were discussed and include the following but no limited to these:  Asymmetry, fluid accumulation, firmness of the area, fat necrosis with death of fat tissue, bleeding, infection,  delayed healing, anesthesia risks, skin  sensation changes, injury to structures including nerves, blood vessels, and muscles which may be temporary or permanent, allergies to tape, suture materials and glues, blood products, topical preparations or injected agents, skin and contour irregularities, skin discoloration and swelling, deep vein thrombosis, cardiac and pulmonary complications, pain, which may persist, persistent pain, recurrence of the lesion, poor healing of the incision, possible need for revisional surgery or staged procedures. Thiere can also be persistent swelling, poor wound healing, rippling or loose skin, worsening of cellulite, swelling, and thermal burn or heat injury from ultrasound with the ultrasound-assisted lipoplasty technique. Any change in weight fluctuations can alter the outcome.  Electronically signed by: Carola Rhine Tyne Banta, PA-C 01/08/2020 3:41 PM

## 2020-01-09 ENCOUNTER — Encounter: Payer: Self-pay | Admitting: Plastic Surgery

## 2020-01-09 ENCOUNTER — Other Ambulatory Visit: Payer: Self-pay

## 2020-01-09 ENCOUNTER — Ambulatory Visit (INDEPENDENT_AMBULATORY_CARE_PROVIDER_SITE_OTHER): Payer: Self-pay | Admitting: Plastic Surgery

## 2020-01-09 VITALS — BP 115/81 | HR 63 | Temp 98.5°F

## 2020-01-09 DIAGNOSIS — Z411 Encounter for cosmetic surgery: Secondary | ICD-10-CM

## 2020-01-09 NOTE — Progress Notes (Signed)
Patient presents to discuss nonsurgical facial rejuvenation.  She has had Botox in the past and would like some more of this.  She is also had some nasolabial fold filler back in April and has been somewhat unhappy with the result.  She felt like the correction is maintained on the right side but not on the left side and she wants to see if that could be corrected.  She currently has dynamic and static righted's in the forehead, glabella and crows feet.  She does have some asymmetry to her nasolabial folds with some degree of correction on the right side that has not been maintained on the left side.  We discussed the risks and benefits of Botox and filler that include bleeding, infection, demonstrating structures need for additional procedures.  We discussed the potential for intravascular injection and how I would avoid it.  All of her questions were answered and she elected to proceed.  Next  1 cc of Restylane define was injected into the nasal nasolabial folds after prepping with an alcohol pad.  I kept the needle moving at all times.  This gave a nice correction and much improved symmetry.  30 cc of Botox were used and distributed around the forehead, crows feet and glabella.  She tolerated this well.  Of asked her to make a touchup appointment in 2 weeks in the event that she does require anything more.  Otherwise we will plan to see her at her next visit.

## 2020-01-21 ENCOUNTER — Encounter (HOSPITAL_BASED_OUTPATIENT_CLINIC_OR_DEPARTMENT_OTHER): Payer: Self-pay | Admitting: Plastic Surgery

## 2020-01-21 ENCOUNTER — Other Ambulatory Visit: Payer: Self-pay

## 2020-01-28 ENCOUNTER — Other Ambulatory Visit (HOSPITAL_COMMUNITY)
Admission: RE | Admit: 2020-01-28 | Discharge: 2020-01-28 | Disposition: A | Discharge status: Home / Self Care | Payer: BC Managed Care – PPO | Source: Ambulatory Visit | Attending: Plastic Surgery | Admitting: Plastic Surgery

## 2020-01-28 DIAGNOSIS — Z01812 Encounter for preprocedural laboratory examination: Secondary | ICD-10-CM | POA: Insufficient documentation

## 2020-01-28 DIAGNOSIS — Z9882 Breast implant status: Secondary | ICD-10-CM | POA: Diagnosis not present

## 2020-01-28 DIAGNOSIS — Z853 Personal history of malignant neoplasm of breast: Secondary | ICD-10-CM | POA: Diagnosis not present

## 2020-01-28 DIAGNOSIS — Z9013 Acquired absence of bilateral breasts and nipples: Secondary | ICD-10-CM | POA: Diagnosis not present

## 2020-01-28 DIAGNOSIS — Z20822 Contact with and (suspected) exposure to covid-19: Secondary | ICD-10-CM | POA: Insufficient documentation

## 2020-01-28 DIAGNOSIS — N651 Disproportion of reconstructed breast: Secondary | ICD-10-CM | POA: Diagnosis present

## 2020-01-28 LAB — SARS CORONAVIRUS 2 (TAT 6-24 HRS): SARS Coronavirus 2: NEGATIVE

## 2020-01-30 ENCOUNTER — Ambulatory Visit (HOSPITAL_BASED_OUTPATIENT_CLINIC_OR_DEPARTMENT_OTHER): Payer: BC Managed Care – PPO | Admitting: Anesthesiology

## 2020-01-30 ENCOUNTER — Encounter (HOSPITAL_BASED_OUTPATIENT_CLINIC_OR_DEPARTMENT_OTHER)
Admission: RE | Disposition: A | Discharge status: Home / Self Care | Payer: Self-pay | Source: Home / Self Care | Attending: Plastic Surgery

## 2020-01-30 ENCOUNTER — Ambulatory Visit (HOSPITAL_BASED_OUTPATIENT_CLINIC_OR_DEPARTMENT_OTHER)
Admission: RE | Admit: 2020-01-30 | Discharge: 2020-01-30 | Disposition: A | Discharge status: Home / Self Care | Payer: BC Managed Care – PPO | Attending: Plastic Surgery | Admitting: Plastic Surgery

## 2020-01-30 ENCOUNTER — Other Ambulatory Visit: Payer: Self-pay

## 2020-01-30 ENCOUNTER — Encounter (HOSPITAL_BASED_OUTPATIENT_CLINIC_OR_DEPARTMENT_OTHER): Payer: Self-pay | Admitting: Plastic Surgery

## 2020-01-30 DIAGNOSIS — Z20822 Contact with and (suspected) exposure to covid-19: Secondary | ICD-10-CM | POA: Insufficient documentation

## 2020-01-30 DIAGNOSIS — Z9882 Breast implant status: Secondary | ICD-10-CM | POA: Insufficient documentation

## 2020-01-30 DIAGNOSIS — Z421 Encounter for breast reconstruction following mastectomy: Secondary | ICD-10-CM

## 2020-01-30 DIAGNOSIS — Z853 Personal history of malignant neoplasm of breast: Secondary | ICD-10-CM | POA: Insufficient documentation

## 2020-01-30 DIAGNOSIS — N651 Disproportion of reconstructed breast: Secondary | ICD-10-CM | POA: Insufficient documentation

## 2020-01-30 DIAGNOSIS — N641 Fat necrosis of breast: Secondary | ICD-10-CM

## 2020-01-30 DIAGNOSIS — Z9013 Acquired absence of bilateral breasts and nipples: Secondary | ICD-10-CM | POA: Insufficient documentation

## 2020-01-30 HISTORY — PX: LIPOSUCTION WITH LIPOFILLING: SHX6436

## 2020-01-30 SURGERY — LIPOSUCTION, WITH FAT TRANSFER
Anesthesia: General | Site: Breast | Laterality: Bilateral

## 2020-01-30 MED ORDER — OXYCODONE HCL 5 MG PO TABS: 5.0000 mg | ORAL_TABLET | ORAL | Status: DC | PRN

## 2020-01-30 MED ORDER — LIDOCAINE HCL (CARDIAC) PF 100 MG/5ML IV SOSY
PREFILLED_SYRINGE | INTRAVENOUS | Status: DC | PRN
  Administered 2020-01-30: 80 mg via INTRAVENOUS

## 2020-01-30 MED ORDER — DEXAMETHASONE SODIUM PHOSPHATE 10 MG/ML IJ SOLN
INTRAMUSCULAR | Status: AC
  Filled 2020-01-30: qty 1

## 2020-01-30 MED ORDER — LACTATED RINGERS IV SOLN: INTRAVENOUS | Status: DC

## 2020-01-30 MED ORDER — LIDOCAINE HCL (PF) 1 % IJ SOLN
INTRAMUSCULAR | Status: AC
  Filled 2020-01-30: qty 60

## 2020-01-30 MED ORDER — LIDOCAINE HCL 1 % IJ SOLN
INTRAMUSCULAR | Status: DC | PRN
  Administered 2020-01-30: 50 mL

## 2020-01-30 MED ORDER — PROPOFOL 10 MG/ML IV BOLUS
INTRAVENOUS | Status: DC | PRN
  Administered 2020-01-30: 150 mg via INTRAVENOUS

## 2020-01-30 MED ORDER — SCOPOLAMINE 1 MG/3DAYS TD PT72
MEDICATED_PATCH | TRANSDERMAL | Status: AC
  Filled 2020-01-30: qty 1

## 2020-01-30 MED ORDER — EPINEPHRINE PF 1 MG/ML IJ SOLN
INTRAMUSCULAR | Status: DC | PRN
  Administered 2020-01-30: 1 mg

## 2020-01-30 MED ORDER — BACITRACIN ZINC 500 UNIT/GM EX OINT
TOPICAL_OINTMENT | CUTANEOUS | Status: AC
  Filled 2020-01-30: qty 28.35

## 2020-01-30 MED ORDER — SODIUM CHLORIDE 0.9 % IV SOLN: 250.0000 mL | INTRAVENOUS | Status: DC | PRN

## 2020-01-30 MED ORDER — FENTANYL CITRATE (PF) 100 MCG/2ML IJ SOLN: 25.0000 ug | INTRAMUSCULAR | Status: DC | PRN

## 2020-01-30 MED ORDER — LIDOCAINE 2% (20 MG/ML) 5 ML SYRINGE
INTRAMUSCULAR | Status: AC
  Filled 2020-01-30: qty 5

## 2020-01-30 MED ORDER — CHLORHEXIDINE GLUCONATE CLOTH 2 % EX PADS: 6.0000 | MEDICATED_PAD | Freq: Once | CUTANEOUS | Status: DC

## 2020-01-30 MED ORDER — EPINEPHRINE PF 1 MG/ML IJ SOLN
INTRAMUSCULAR | Status: AC
  Filled 2020-01-30: qty 1

## 2020-01-30 MED ORDER — CEFAZOLIN SODIUM-DEXTROSE 2-4 GM/100ML-% IV SOLN
INTRAVENOUS | Status: AC
  Filled 2020-01-30: qty 100

## 2020-01-30 MED ORDER — FENTANYL CITRATE (PF) 100 MCG/2ML IJ SOLN
INTRAMUSCULAR | Status: DC | PRN
  Administered 2020-01-30: 50 ug via INTRAVENOUS
  Administered 2020-01-30: 25 ug via INTRAVENOUS
  Administered 2020-01-30: 50 ug via INTRAVENOUS

## 2020-01-30 MED ORDER — OXYCODONE HCL 5 MG/5ML PO SOLN: 5.0000 mg | Freq: Once | ORAL | Status: DC | PRN

## 2020-01-30 MED ORDER — MIDAZOLAM HCL 2 MG/2ML IJ SOLN
INTRAMUSCULAR | Status: AC
  Filled 2020-01-30: qty 2

## 2020-01-30 MED ORDER — LIDOCAINE-EPINEPHRINE 1 %-1:100000 IJ SOLN
INTRAMUSCULAR | Status: DC | PRN
  Administered 2020-01-30: 7 mL

## 2020-01-30 MED ORDER — DEXAMETHASONE SODIUM PHOSPHATE 4 MG/ML IJ SOLN
INTRAMUSCULAR | Status: DC | PRN
  Administered 2020-01-30: 10 mg via INTRAVENOUS

## 2020-01-30 MED ORDER — ONDANSETRON HCL 4 MG/2ML IJ SOLN: 4.0000 mg | Freq: Four times a day (QID) | INTRAMUSCULAR | Status: DC | PRN

## 2020-01-30 MED ORDER — BUPIVACAINE HCL (PF) 0.25 % IJ SOLN
INTRAMUSCULAR | Status: AC
  Filled 2020-01-30: qty 30

## 2020-01-30 MED ORDER — FENTANYL CITRATE (PF) 100 MCG/2ML IJ SOLN
INTRAMUSCULAR | Status: AC
  Filled 2020-01-30: qty 2

## 2020-01-30 MED ORDER — ACETAMINOPHEN 325 MG RE SUPP: 650.0000 mg | RECTAL | Status: DC | PRN

## 2020-01-30 MED ORDER — LACTATED RINGERS IV SOLN
INTRAVENOUS | Status: AC | PRN
  Administered 2020-01-30: 1000 mL

## 2020-01-30 MED ORDER — EPHEDRINE SULFATE 50 MG/ML IJ SOLN
INTRAMUSCULAR | Status: DC | PRN
  Administered 2020-01-30 (×2): 10 mg via INTRAVENOUS

## 2020-01-30 MED ORDER — EPHEDRINE 5 MG/ML INJ
INTRAVENOUS | Status: AC
  Filled 2020-01-30: qty 10

## 2020-01-30 MED ORDER — LIDOCAINE-EPINEPHRINE 1 %-1:100000 IJ SOLN
INTRAMUSCULAR | Status: AC
  Filled 2020-01-30: qty 1

## 2020-01-30 MED ORDER — SODIUM CHLORIDE 0.9% FLUSH: 3.0000 mL | INTRAVENOUS | Status: DC | PRN

## 2020-01-30 MED ORDER — DIPHENHYDRAMINE HCL 50 MG/ML IJ SOLN
INTRAMUSCULAR | Status: AC
  Filled 2020-01-30: qty 1

## 2020-01-30 MED ORDER — SODIUM CHLORIDE 0.9% FLUSH: 3.0000 mL | Freq: Two times a day (BID) | INTRAVENOUS | Status: DC

## 2020-01-30 MED ORDER — ACETAMINOPHEN 325 MG PO TABS: 650.0000 mg | ORAL_TABLET | ORAL | Status: DC | PRN

## 2020-01-30 MED ORDER — CEFAZOLIN SODIUM-DEXTROSE 2-4 GM/100ML-% IV SOLN
2.0000 g | INTRAVENOUS | Status: AC
  Administered 2020-01-30: 2 g via INTRAVENOUS

## 2020-01-30 MED ORDER — MIDAZOLAM HCL 5 MG/5ML IJ SOLN
INTRAMUSCULAR | Status: DC | PRN
  Administered 2020-01-30: 2 mg via INTRAVENOUS

## 2020-01-30 MED ORDER — ONDANSETRON HCL 4 MG/2ML IJ SOLN
INTRAMUSCULAR | Status: AC
  Filled 2020-01-30: qty 2

## 2020-01-30 MED ORDER — ONDANSETRON HCL 4 MG/2ML IJ SOLN
INTRAMUSCULAR | Status: DC | PRN
  Administered 2020-01-30: 4 mg via INTRAVENOUS

## 2020-01-30 MED ORDER — PHENYLEPHRINE 40 MCG/ML (10ML) SYRINGE FOR IV PUSH (FOR BLOOD PRESSURE SUPPORT)
PREFILLED_SYRINGE | INTRAVENOUS | Status: AC
  Filled 2020-01-30: qty 10

## 2020-01-30 MED ORDER — SUCCINYLCHOLINE CHLORIDE 200 MG/10ML IV SOSY
PREFILLED_SYRINGE | INTRAVENOUS | Status: AC
  Filled 2020-01-30: qty 10

## 2020-01-30 MED ORDER — SCOPOLAMINE 1 MG/3DAYS TD PT72
1.0000 | MEDICATED_PATCH | TRANSDERMAL | Status: DC
  Administered 2020-01-30: 1.5 mg via TRANSDERMAL

## 2020-01-30 MED ORDER — OXYCODONE HCL 5 MG PO TABS: 5.0000 mg | ORAL_TABLET | Freq: Once | ORAL | Status: DC | PRN

## 2020-01-30 SURGICAL SUPPLY — 55 items
ADH SKN CLS APL DERMABOND .7 (GAUZE/BANDAGES/DRESSINGS) ×1
BAG DRN INLT TBG SET TISS ACC (MISCELLANEOUS) ×1
BINDER ABDOMINAL  9 SM 30-45 (SOFTGOODS)
BINDER ABDOMINAL 10 UNV 27-48 (MISCELLANEOUS) IMPLANT
BINDER ABDOMINAL 12 SM 30-45 (SOFTGOODS) ×2 IMPLANT
BINDER ABDOMINAL 9 SM 30-45 (SOFTGOODS) IMPLANT
BINDER BREAST LRG (GAUZE/BANDAGES/DRESSINGS) IMPLANT
BINDER BREAST MEDIUM (GAUZE/BANDAGES/DRESSINGS) IMPLANT
BINDER BREAST XLRG (GAUZE/BANDAGES/DRESSINGS) IMPLANT
BINDER BREAST XXLRG (GAUZE/BANDAGES/DRESSINGS) IMPLANT
BLADE HEX COATED 2.75 (ELECTRODE) IMPLANT
BLADE SURG 15 STRL LF DISP TIS (BLADE) ×1 IMPLANT
BLADE SURG 15 STRL SS (BLADE) ×3
BNDG GAUZE ELAST 4 BULKY (GAUZE/BANDAGES/DRESSINGS) ×2 IMPLANT
COVER BACK TABLE 60X90IN (DRAPES) ×3 IMPLANT
COVER MAYO STAND STRL (DRAPES) ×3 IMPLANT
COVER WAND RF STERILE (DRAPES) IMPLANT
DECANTER SPIKE VIAL GLASS SM (MISCELLANEOUS) IMPLANT
DERMABOND ADVANCED (GAUZE/BANDAGES/DRESSINGS) ×2
DERMABOND ADVANCED .7 DNX12 (GAUZE/BANDAGES/DRESSINGS) ×1 IMPLANT
DRAPE LAPAROSCOPIC ABDOMINAL (DRAPES) ×3 IMPLANT
DRSG PAD ABDOMINAL 8X10 ST (GAUZE/BANDAGES/DRESSINGS) ×6 IMPLANT
ELECT REM PT RETURN 9FT ADLT (ELECTROSURGICAL) ×3
ELECTRODE REM PT RTRN 9FT ADLT (ELECTROSURGICAL) ×1 IMPLANT
EXTRACTOR CANIST REVOLVE STRL (CANNISTER) ×1 IMPLANT
GLOVE BIO SURGEON STRL SZ 6.5 (GLOVE) ×5 IMPLANT
GLOVE BIO SURGEONS STRL SZ 6.5 (GLOVE) ×3
GLOVE BIOGEL PI IND STRL 7.0 (GLOVE) IMPLANT
GLOVE BIOGEL PI INDICATOR 7.0 (GLOVE) ×2
GOWN STRL REUS W/ TWL LRG LVL3 (GOWN DISPOSABLE) ×2 IMPLANT
GOWN STRL REUS W/TWL LRG LVL3 (GOWN DISPOSABLE) ×6
IV LACTATED RINGERS 1000ML (IV SOLUTION) ×6 IMPLANT
LINER CANISTER 1000CC FLEX (MISCELLANEOUS) ×3 IMPLANT
NDL HYPO 25X1 1.5 SAFETY (NEEDLE) IMPLANT
NDL SAFETY ECLIPSE 18X1.5 (NEEDLE) ×1 IMPLANT
NEEDLE HYPO 18GX1.5 SHARP (NEEDLE) ×3
NEEDLE HYPO 25X1 1.5 SAFETY (NEEDLE) IMPLANT
PACK BASIN DAY SURGERY FS (CUSTOM PROCEDURE TRAY) ×3 IMPLANT
PAD ALCOHOL SWAB (MISCELLANEOUS) ×3 IMPLANT
PENCIL SMOKE EVACUATOR (MISCELLANEOUS) IMPLANT
SLEEVE SCD COMPRESS KNEE MED (MISCELLANEOUS) ×3 IMPLANT
SPONGE LAP 18X18 RF (DISPOSABLE) ×3 IMPLANT
SUT MNCRL AB 4-0 PS2 18 (SUTURE) IMPLANT
SUT MON AB 5-0 PS2 18 (SUTURE) ×6 IMPLANT
SYR 10ML LL (SYRINGE) ×12 IMPLANT
SYR 3ML 18GX1 1/2 (SYRINGE) IMPLANT
SYR 50ML LL SCALE MARK (SYRINGE) ×6 IMPLANT
SYR CONTROL 10ML LL (SYRINGE) ×3 IMPLANT
SYR TOOMEY 50ML (SYRINGE) ×4 IMPLANT
SYSTEM FAT FILTRATION 250 (MISCELLANEOUS) ×2 IMPLANT
TOWEL GREEN STERILE FF (TOWEL DISPOSABLE) ×6 IMPLANT
TRAY DSU PREP LF (CUSTOM PROCEDURE TRAY) ×3 IMPLANT
TUBING INFILTRATION IT-10001 (TUBING) ×2 IMPLANT
TUBING SET GRADUATE ASPIR 12FT (MISCELLANEOUS) ×3 IMPLANT
UNDERPAD 30X36 HEAVY ABSORB (UNDERPADS AND DIAPERS) ×6 IMPLANT

## 2020-01-30 NOTE — Anesthesia Procedure Notes (Signed)
Procedure Name: LMA Insertion Performed by: Willa Frater, CRNA Pre-anesthesia Checklist: Patient identified, Emergency Drugs available, Suction available and Patient being monitored Patient Re-evaluated:Patient Re-evaluated prior to induction Oxygen Delivery Method: Circle system utilized Preoxygenation: Pre-oxygenation with 100% oxygen Induction Type: IV induction Ventilation: Mask ventilation without difficulty LMA: LMA inserted LMA Size: 4.0 Grade View: Grade I Number of attempts: 1 Airway Equipment and Method: Bite block Placement Confirmation: positive ETCO2 Tube secured with: Tape Dental Injury: Teeth and Oropharynx as per pre-operative assessment

## 2020-01-30 NOTE — Interval H&P Note (Signed)
History and Physical Interval Note:  01/30/2020 10:47 AM  [name]  has presented today for surgery, with the diagnosis of history of breast cancer.  The various methods of treatment have been discussed with the patient and family. After consideration of risks, benefits and other options for treatment, the patient has consented to  Procedure(s) with comments: LIPOSUCTION WITH LIPOFILLING (Bilateral) - 90 min, please as a surgical intervention.  The patient's history has been reviewed, patient examined, no change in status, stable for surgery.  I have reviewed the patient's chart and labs.  Questions were answered to the patient's satisfaction.     Loel Lofty [name]

## 2020-01-30 NOTE — Transfer of Care (Signed)
Immediate Anesthesia Transfer of Care Note  Patient: [name]  Procedure(s) Performed: LIPOSUCTION WITH LIPOFILLING (Bilateral Breast)  Patient Location: PACU  Anesthesia Type:General  Level of Consciousness: sedated  Airway & Oxygen Therapy: Patient Spontanous Breathing and Patient connected to face mask oxygen  Post-op Assessment: Report given to RN and Post -op Vital signs reviewed and stable  Post vital signs: Reviewed and stable  Last Vitals:  Vitals Value Taken Time  BP    Temp    Pulse 84 01/30/20 1312  Resp 8 01/30/20 1312  SpO2 99 % 01/30/20 1312  Vitals shown include unvalidated device data.  Last Pain:  Vitals:   01/30/20 1046  TempSrc: Oral  PainSc: 0-No pain         Complications: No complications documented.

## 2020-01-30 NOTE — Anesthesia Preprocedure Evaluation (Signed)
Anesthesia Evaluation  Patient identified by MRN, date of birth, ID band Patient awake    Reviewed: Allergy & Precautions, H&P , NPO status , Patient's Chart, lab work & pertinent test results  History of Anesthesia Complications (+) PONV and history of anesthetic complications  Airway Mallampati: II   Neck ROM: full    Dental   Pulmonary neg pulmonary ROS,    breath sounds clear to auscultation       Cardiovascular negative cardio ROS   Rhythm:regular Rate:Normal     Neuro/Psych    GI/Hepatic   Endo/Other    Renal/GU      Musculoskeletal  (+) Arthritis ,   Abdominal   Peds  Hematology   Anesthesia Other Findings   Reproductive/Obstetrics H/o breast CA                             Anesthesia Physical Anesthesia Plan  ASA: II  Anesthesia Plan: General   Post-op Pain Management:    Induction: Intravenous  PONV Risk Score and Plan: 4 or greater and Ondansetron, Dexamethasone, Midazolam and Treatment may vary due to age or medical condition  Airway Management Planned: LMA  Additional Equipment:   Intra-op Plan:   Post-operative Plan: Extubation in OR  Informed Consent: I have reviewed the patients History and Physical, chart, labs and discussed the procedure including the risks, benefits and alternatives for the proposed anesthesia with the patient or authorized representative who has indicated his/her understanding and acceptance.       Plan Discussed with: CRNA, Anesthesiologist and Surgeon  Anesthesia Plan Comments:         Anesthesia Quick Evaluation

## 2020-01-30 NOTE — Discharge Instructions (Addendum)
Post Anesthesia Home Care Instructions  Activity: Get plenty of rest for the remainder of the day. A responsible individual must stay with you for 24 hours following the procedure.  For the next 24 hours, DO NOT: -Drive a car -Paediatric nurse -Drink alcoholic beverages -Take any medication unless instructed by your physician -Make any legal decisions or sign important papers.  Meals: Start with liquid foods such as gelatin or soup. Progress to regular foods as tolerated. Avoid greasy, spicy, heavy foods. If nausea and/or vomiting occur, drink only clear liquids until the nausea and/or vomiting subsides. Call your physician if vomiting continues.  Special Instructions/Symptoms: Your throat may feel dry or sore from the anesthesia or the breathing tube placed in your throat during surgery. If this causes discomfort, gargle with warm salt water. The discomfort should disappear within 24 hours.  If you had a scopolamine patch placed behind your ear for the management of post- operative nausea and/or vomiting:  1. The medication in the patch is effective for 72 hours, after which it should be removed.  Wrap patch in a tissue and discard in the trash. Wash hands thoroughly with soap and water. 2. You may remove the patch earlier than 72 hours if you experience unpleasant side effects which may include dry mouth, dizziness or visual disturbances. 3. Avoid touching the patch. Wash your hands with soap and water after contact with the patch.    INSTRUCTIONS FOR AFTER SURGERY   You will likely have some questions about what to expect following your operation.  The following information will help you and your family understand what to expect when you are discharged from the hospital.  Following these guidelines will help ensure a smooth recovery and reduce risks of complications.  Postoperative instructions include information on: diet, wound care, medications and physical activity.  AFTER  SURGERY Expect to go home after the procedure.  In some cases, you may need to spend one night in the hospital for observation.  DIET This surgery does not require a specific diet.  However, I have to mention that the healthier you eat the better your body can start healing. It is important to increasing your protein intake.  This means limiting the foods with added sugar.  Focus on fruits and vegetables and some meat. It is very important to drink water after your surgery.  If your urine is bright yellow, then it is concentrated, and you need to drink more water.  As a general rule after surgery, you should have 8 ounces of water every hour while awake.  If you find you are persistently nauseated or unable to take in liquids let us know.  NO TOBACCO USE or EXPOSURE.  This will slow your healing process and increase the risk of a wound.  WOUND CARE You can shower the day after surgery.  Use fragrance free soap.  Dial, [place], [place] and Cetaphil are usually mild on the skin.    If you have steri-strips / tape directly attached to your skin leave them in place. It is OK to get these wet.  No baths, pools or hot tubs for two weeks. We close your incision to leave the smallest and best-looking scar. No ointment or creams on your incisions until given the go ahead.  Especially not Neosporin (Too many skin reactions with this one).  A few weeks after surgery you can use Mederma and start massaging the scar. We ask you to wear your binder or sports bra for the  first 6 weeks around the clock, including while sleeping. This provides added comfort and helps reduce the fluid accumulation at the surgery site.  ACTIVITY No heavy lifting until cleared by the doctor.  It is OK to walk and climb stairs. In fact, moving your legs is very important to decrease your risk of a blood clot.  It will also help keep you from getting deconditioned.  Every 1 to 2 hours get up and walk for 5 minutes. This will help with a quicker  recovery back to normal.  Let pain be your guide so you don't do too much.  NO, you cannot do the spring cleaning and don't plan on taking care of anyone else.  This is your time for TLC.   WORK Everyone returns to work at different times. As a rough guide, most people take at least 1 - 2 weeks off prior to returning to work. If you need documentation for your job, bring the forms to your postoperative follow up visit.  DRIVING Arrange for someone to bring you home from the hospital.  You may be able to drive a few days after surgery but not while taking any narcotics or valium.  BOWEL MOVEMENTS Constipation can occur after anesthesia and while taking pain medication.  It is important to stay ahead for your comfort.  We recommend taking Milk of Magnesia (2 tablespoons; twice a day) while taking the pain pills.  SEROMA This is fluid your body tried to put in the surgical site.  This is normal but if it creates excessive pain and swelling let us know.  It usually decreases in a few weeks.  MEDICATIONS and PAIN CONTROL At your preoperative visit for you history and physical you were given the following medications: 1. An antibiotic: Start this medication when you get home and take according to the instructions on the bottle. 2. Zofran 4 mg:  This is to treat nausea and vomiting.  You can take this every 6 hours as needed and only if needed. 3. Norco (hydrocodone/acetaminophen) 5/325 mg:  This is only to be used after you have taken the motrin or the tylenol. Every 8 hours as needed. Over the counter Medication to take: 4. Ibuprofen (Motrin) 600 mg:  Take this every 6 hours.  If you have additional pain then take 500 mg of the tylenol.  Only take the Norco after you have tried these two. 5. Miralax or stool softener of choice: Take this according to the bottle if you take the [place] Call your surgeon's office if any of the following occur: . Fever 101 degrees F or greater .  Excessive bleeding or fluid from the incision site. . Pain that increases over time without aid from the medications . Redness, warmth, or pus draining from incision sites . Persistent nausea or inability to take in liquids . Severe misshapen area that underwent the operation.

## 2020-01-30 NOTE — Op Note (Signed)
DATE OF OPERATION: 01/30/2020  LOCATION: [facility]  PREOPERATIVE DIAGNOSIS: breast asymmetry after reconstruction for breast cancer  POSTOPERATIVE DIAGNOSIS: Same  PROCEDURE: lipofilling of bilateral breasts for symmetry  SURGEON: Prashant Glosser Sanger Tiara Maultsby, DO  ASSISTANT: Roetta Sessions, PA  EBL: 2 cc  CONDITION: Stable  COMPLICATIONS: None  INDICATION: The patient, [name], is a 64 y.o. female born on 1955-07-09, is here for treatment of breast asymmetry after breast reconstruction for breast cancer.  The patient had mastectomies with implant-based reconstruction.  She had loss of volume of the upper medial pole.   PROCEDURE DETAILS:  The patient was seen prior to surgery and marked.  The IV antibiotics were given. The patient was taken to the operating room and given a general anesthetic. A standard time out was performed and all information was confirmed by those in the room. SCDs were placed.   The abdomen and chest were prepped and draped.  Local with epinephrine was injected at the umbilical area and at the medial incision of each breast.  A #15 blade was used to make a 2 mm incision at the previous mentioned site.  The tumescent was then infused into the abdominal fat layer.  After waiting several minutes for the epinephrine to take effect liposuction was performed.  We were able to get approximately 250 cc out.  The fat was prepared according to the manufacture guidelines of pure graft.  The fat was then placed in 10 cc syringes.  80 cc was placed in the left breast in the superior and the medial aspect.  40 cc was placed in the right breast in the superior medial aspect.  All incisions were closed with 5-0 Monocryl.  The patient was allowed to wake up and taken to recovery room in stable condition at the end of the case. The family was notified at the end of the case.   The advanced practice practitioner (APP) assisted throughout the case.  The APP was  essential in retraction and counter traction when needed to make the case progress smoothly.  This retraction and assistance made it possible to see the tissue plans for the procedure.  The assistance was needed for blood control, tissue re-approximation and assisted with closure of the incision site.

## 2020-01-31 ENCOUNTER — Encounter (HOSPITAL_BASED_OUTPATIENT_CLINIC_OR_DEPARTMENT_OTHER): Payer: Self-pay | Admitting: Plastic Surgery

## 2020-01-31 NOTE — Anesthesia Postprocedure Evaluation (Signed)
Anesthesia Post Note  Patient: [name]  Procedure(s) Performed: LIPOSUCTION WITH LIPOFILLING (Bilateral Breast)     Patient location during evaluation: PACU Anesthesia Type: General Level of consciousness: awake and alert Pain management: pain level controlled Vital Signs Assessment: post-procedure vital signs reviewed and stable Respiratory status: spontaneous breathing, nonlabored ventilation, respiratory function stable and patient connected to nasal cannula oxygen Cardiovascular status: blood pressure returned to baseline and stable Postop Assessment: no apparent nausea or vomiting Anesthetic complications: no   No complications documented.  Last Vitals:  Vitals:   01/30/20 1345 01/30/20 1425  BP: (!) 124/91 126/88  Pulse: 80 74  Resp: 19 18  Temp:  36.6 C  SpO2: 100% 98%    Last Pain:  Vitals:   01/31/20 1044  TempSrc:   PainSc: 2                  Aldair Rickel S

## 2020-02-06 ENCOUNTER — Encounter: Payer: BC Managed Care – PPO | Admitting: Plastic Surgery

## 2020-02-07 ENCOUNTER — Encounter: Payer: Self-pay | Admitting: Plastic Surgery

## 2020-02-08 ENCOUNTER — Telehealth (INDEPENDENT_AMBULATORY_CARE_PROVIDER_SITE_OTHER): Payer: BC Managed Care – PPO | Admitting: Plastic Surgery

## 2020-02-08 ENCOUNTER — Other Ambulatory Visit: Payer: Self-pay

## 2020-02-08 ENCOUNTER — Encounter: Payer: Self-pay | Admitting: Plastic Surgery

## 2020-02-08 ENCOUNTER — Encounter: Payer: BC Managed Care – PPO | Admitting: Plastic Surgery

## 2020-02-08 ENCOUNTER — Telehealth: Payer: BC Managed Care – PPO | Admitting: Plastic Surgery

## 2020-02-08 DIAGNOSIS — Z9889 Other specified postprocedural states: Secondary | ICD-10-CM

## 2020-02-08 NOTE — Progress Notes (Signed)
The patient is a 64 year old female joining me by telephone for follow-up after Lipo filling of her breasts.  She is quite bruised and sore.  Her pain is well controlled and she is eating and voiding without difficulty.  She uses MiraLAX and so is doing well with her bowels.  She is pleased with the results so far.  No questions or concerns.  She is planning on seeing this next week.  I connected with  Zaineb Nowaczyk on 58/83/25 by a phone application and verified that I am speaking with the correct person using two identifiers.  The patient was at home and I was in the office.  We spent 5 minutes in discussion.   I discussed the limitations of evaluation and management by telemedicine. The patient expressed understanding and agreed to proceed.

## 2020-02-19 ENCOUNTER — Encounter: Payer: Self-pay | Admitting: Plastic Surgery

## 2020-02-19 ENCOUNTER — Ambulatory Visit (INDEPENDENT_AMBULATORY_CARE_PROVIDER_SITE_OTHER): Payer: BC Managed Care – PPO | Admitting: Plastic Surgery

## 2020-02-19 ENCOUNTER — Other Ambulatory Visit: Payer: Self-pay

## 2020-02-19 VITALS — BP 125/90 | HR 72 | Temp 97.3°F

## 2020-02-19 DIAGNOSIS — Z9889 Other specified postprocedural states: Secondary | ICD-10-CM

## 2020-02-19 NOTE — Progress Notes (Signed)
The patient is a 64 year old female here for follow-up on her breast surgery.  She had Lipo filling.  The bruising is nearly gone.  It is as expected with a little bit of swelling.  She is very pleased with the results.  No sign of seroma or hematoma.  She is healing nicely.  Follow-up in 1 year.  Plans for nipple areola tattoo with Doroteo Bradford in January.

## 2020-03-17 ENCOUNTER — Ambulatory Visit: Payer: BC Managed Care – PPO

## 2020-03-28 ENCOUNTER — Ambulatory Visit: Payer: BC Managed Care – PPO

## 2020-03-28 ENCOUNTER — Ambulatory Visit (INDEPENDENT_AMBULATORY_CARE_PROVIDER_SITE_OTHER): Payer: BC Managed Care – PPO

## 2020-03-28 ENCOUNTER — Other Ambulatory Visit: Payer: Self-pay

## 2020-03-28 VITALS — BP 138/86 | HR 86 | Temp 97.8°F | Ht 67.0 in | Wt 147.0 lb

## 2020-03-28 DIAGNOSIS — Z9013 Acquired absence of bilateral breasts and nipples: Secondary | ICD-10-CM | POA: Diagnosis not present

## 2020-03-31 ENCOUNTER — Telehealth: Payer: Self-pay

## 2020-03-31 NOTE — Telephone Encounter (Signed)
Patient called back to follow up on her previous call. She wanted to find out how many times she should change the bandage whether it's once or twice per day. She was going to go to the medical supply store and they close early so she wanted to double check to see what is recommended. What else could be used if she does not need to get supplies similar to what [place] gave her.

## 2020-03-31 NOTE — Telephone Encounter (Signed)
Patient called to find out if she needs to purchase more gauze with petroleum in it, she said it was called Cruad.  If so, should she buy regular gauze?  Also, how often should she change the dressing?  Please call.

## 2020-04-09 NOTE — Progress Notes (Signed)
NIPPLE AREOLAR TATTOO PROCEDURE  PREOPERATIVE DIAGNOSIS:  Acquired absence of (BILATERAL) nipple areolar   POSTOPERATIVE DIAGNOSIS: Acquired absence of (BILATERAL) nipple areolar    PROCEDURES: (BILATERAL)nipple areolar tattoo   ATTENDING SURGEON: Dr. Lyndee Leo Sanger   ANESTHESIA:  EMLA  COMPLICATIONS: None.  JUSTIFICATION FOR PROCEDURE:  [name] is a 65 y.o. female with a history of breast cancer status post bilateral breast reconstruction. The patient presents for bilateral nipple areolar complex tattoo. Risks, benefits, indications, and alternatives of the above described procedures were discussed with the patient and all the patient's questions were answered.   DESCRIPTION OF PROCEDURE: After informed consent was obtained and proper identification of patient and surgical site was made, the patient was taken to the procedure room and pre-procedure photos were taken & entered into chart. Placement/size/shape & colors were chosen by pt. Pt was then placed supine on the operating room table. A time out was performed to confirm patient's identity and surgical site. The patient was prepped and draped in the usual sterile fashion.   Using a # 7 pronged tattoo head, pigment was instilled to the designed bilateral nipple areolar complex. Once adequate pigment had been applied to the nipple areolar complex, a post-procedure photos taken. vaseline/ xeroform & gauze dressing was applied. The patient tolerated the procedure well.  Reviewed post-care instructions with pt.  World Famous Tattoo InkDarcella Waters- lot# [id number] / exp 02/07/22 Dark Honey- lot# [id number] / exp 02/07/22 Cool Peach- lot# [id number] / exp 02/07/22 Fair Peach- lot# [id number] / exp 02/07/22 [name]- ANV#[id number] / exp 02/08/22 Cool Mink- [id number] / 02/07/22 Ultra Duration used as needed for anesthesia- [id number] UD / exp 04/2021

## 2020-06-09 ENCOUNTER — Other Ambulatory Visit: Payer: Self-pay

## 2020-06-09 ENCOUNTER — Ambulatory Visit (INDEPENDENT_AMBULATORY_CARE_PROVIDER_SITE_OTHER): Payer: BC Managed Care – PPO

## 2020-06-09 VITALS — BP 128/81 | HR 76 | Temp 97.8°F | Ht 67.0 in | Wt 145.0 lb

## 2020-06-09 DIAGNOSIS — Z9013 Acquired absence of bilateral breasts and nipples: Secondary | ICD-10-CM

## 2020-06-10 NOTE — Patient Instructions (Signed)
Review of after care instructions: Vaseline/xeroform/gauze dressing for approx 10 days. She understands that she may need to have touch-up tattoo sessions d/t colors fading. She will call for any concerns or questions.

## 2020-06-10 NOTE — Progress Notes (Signed)
NIPPLE AREOLAR TATTOO PROCEDURE  PREOPERATIVE DIAGNOSIS:  Acquired absence of (BILATERAL) nipple areolar   POSTOPERATIVE DIAGNOSIS: Acquired absence of (BILATERAL) nipple areolar    PROCEDURES: (BILATERAL) nipple areolar tattoo touch-up  ATTENDING SURGEON: Dr. Lyndee Leo Dillingham  ANESTHESIA:  EMLA  COMPLICATIONS: None.  JUSTIFICATION FOR PROCEDURE:  [name] is a 65 y.o. female with a history of breast cancer status post bilateral breast reconstruction. The patient presents for nipple areolar complex tattoo. Risks, benefits, indications, and alternatives of the above described procedures were discussed with the patient and all the patient's questions were answered.   DESCRIPTION OF PROCEDURE: After informed consent was obtained and proper identification of patient and surgical site was made, the patient was taken to the procedure room and pre-procedure photos were taken & entered into chart. Pt  was placed supine on the operating room table. A time out was performed to confirm patient's identity and surgical site. The patient was prepped and draped in the usual sterile fashion.  Using a # 7 tattoo head, pigment was instilled to the designed nipple areolar. Once adequate pigment had been applied to the nipple areolar complex,  vaseline, xeroform & gauze  dressing was applied. The patient tolerated the procedure well.   World Famous Tattoo ink used: [place]/ [place] [employer] [place] [employer]/ Cool Mink/  Ultra Duration used for anesthesia  Lot # 's & exp dates on file.

## 2020-08-21 ENCOUNTER — Ambulatory Visit (INDEPENDENT_AMBULATORY_CARE_PROVIDER_SITE_OTHER): Payer: Self-pay | Admitting: Plastic Surgery

## 2020-08-21 ENCOUNTER — Other Ambulatory Visit: Payer: Self-pay

## 2020-08-21 ENCOUNTER — Ambulatory Visit (INDEPENDENT_AMBULATORY_CARE_PROVIDER_SITE_OTHER): Payer: BC Managed Care – PPO

## 2020-08-21 VITALS — BP 118/76 | HR 74 | Temp 97.6°F | Ht 67.0 in | Wt 143.0 lb

## 2020-08-21 DIAGNOSIS — Z9013 Acquired absence of bilateral breasts and nipples: Secondary | ICD-10-CM | POA: Diagnosis not present

## 2020-08-21 DIAGNOSIS — Z411 Encounter for cosmetic surgery: Secondary | ICD-10-CM

## 2020-08-21 NOTE — Progress Notes (Signed)
Patient presents to discuss Botox treatment.  I did 30 units at her last visit several months ago and she like that.  That was distributed in the forehead, glabella and crows feet.  We discussed the risks and benefits of Botox treatment and she understands and wants to proceed.  I did use 34 units this time to give her a little bit more in the glabella as she did say that there was some persistent lines in that area.  This was all distributed throughout the forehead, glabella and crows feet after prepping the face with alcohol pad.  She tolerated this well.  We will plan to see her within the next few weeks if any touchups are necessary and otherwise at her next visit.  All of her questions were answered.

## 2020-09-16 NOTE — Progress Notes (Signed)
08/21/20-  NIPPLE AREOLAR TATTOO PROCEDURE  PREOPERATIVE DIAGNOSIS:  Acquired absence of BILATERAL nipple areolar   POSTOPERATIVE DIAGNOSIS: Acquired absence of BILATERAL nipple areolar    PROCEDURES: BILATERAL nipple areolar tattoo touch-up  ATTENDING SURGEON: Dr. Lyndee Leo Waters   ANESTHESIA:  EMLA  COMPLICATIONS: None.  JUSTIFICATION FOR PROCEDURE:  [name] is a 65 y.o. female with a history of breast cancer status post bilateral breast reconstruction. The patient presents for bilateral nipple areolar complex tattoo touch-up. Risks, benefits, indications, and alternatives of the above described procedures were discussed with the patient and all the patient's questions were answered.   DESCRIPTION OF PROCEDURE: After informed consent was obtained and proper identification of patient and surgical site was made, the patient was taken to the procedure room and pre-procedure photo taken. There was a significant amount of fading in the center of both NAC- left > right- these faded areas are inline with the bilateral scars.- she understands that the incision scars can be more difficult in getting the ink to instill & remain. Patient was then placed supine on the operating room table. A time out was performed to confirm patient's identity and surgical site. The patient was prepped and draped in the usual sterile fashion.  Using a #7  pronged [employer]  tattoo head, pigment was instilled to the designed nipple areolar complexes.  Once adequate pigment had been applied to the nipple areolar complex, vaseline/ xeroform & gauze dressing was applied. The patient tolerated the procedure well.   World Famous Tattoo Ink used: Dark Tan / Warm {each / Warm Honey / Fair Honey [place] Ultra Duration used as needed for topical anesthesia Lot #'s & exp dates on file

## 2020-09-16 NOTE — Patient Instructions (Signed)
Patient is reminded to use vaseline/xeroform & gauze dressings until healed She may need further touch-up sessions especially in the scar areas. She will call for any concerns.

## 2020-10-16 ENCOUNTER — Ambulatory Visit: Payer: BC Managed Care – PPO

## 2020-11-13 IMAGING — MR MR BREAST BILAT WO/W CM
6 of 13 series · 18 of 48 positions shown · IV contrast (Yes)
Comparison: Previous exam(s).

CLINICAL DATA: 63-year-old female with recently diagnosed with DCIS
in the lower outer quadrant of the right breast at an outside
institution. An additional group of calcifications in the upper
outer quadrant felt to most likely be vascular at the time of
stereotactic biopsy.

LABS:  None performed today and site.
EXAM:
BILATERAL BREAST MRI WITH AND WITHOUT CONTRAST
TECHNIQUE: Multiplanar, multisequence MR images of both breasts were obtained
prior to and following the intravenous administration of 7 ml of
Gadavist.

[Series 1: acess#(phone_number) · axial · 7.0mm · 1.56mm/px · 1 of 25 slices shown]
[im 1/25]
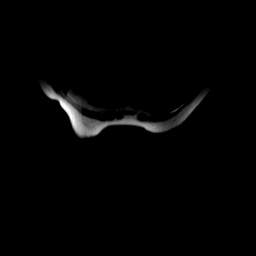

[Series 3: T1 · axial · non-contrast · 1.8mm · 0.57mm/px · z∈[-125,+76]mm · 5 of 224 slices shown]
[im 1/224]
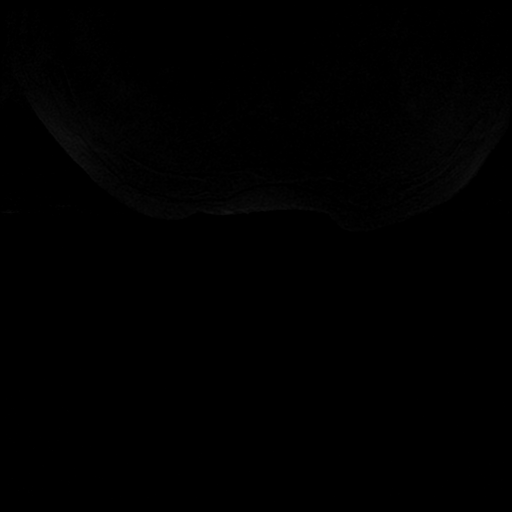
[im 56/224]
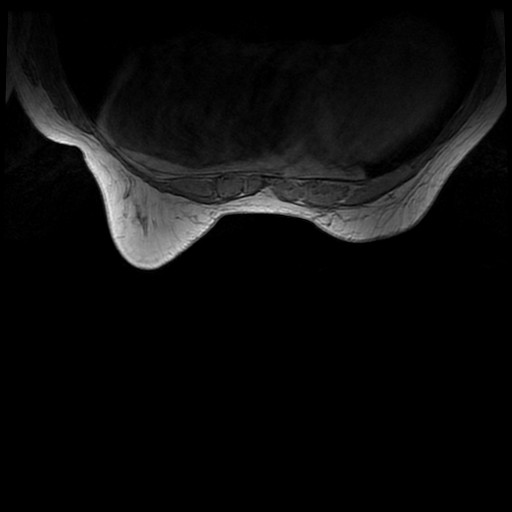
[im 112/224]
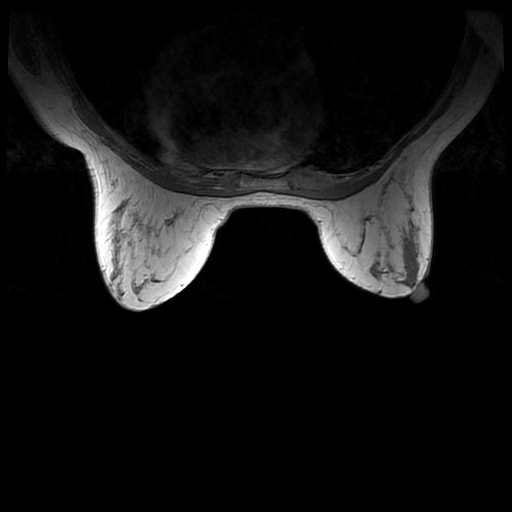
[im 168/224]
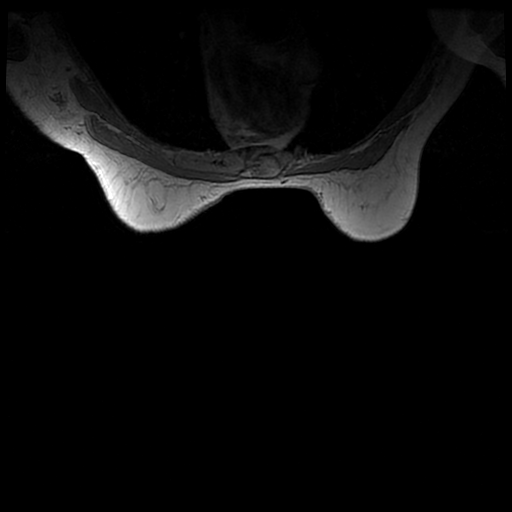
[im 224/224]
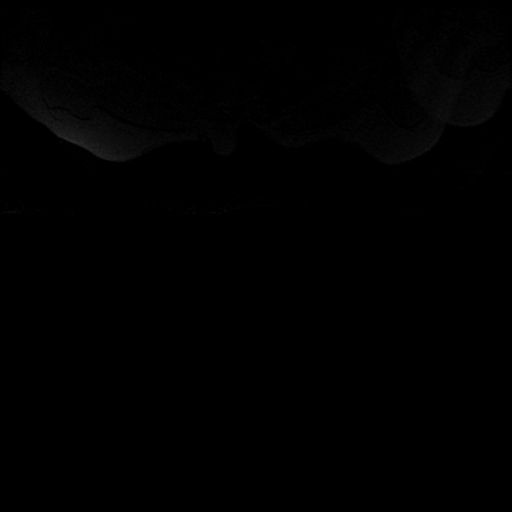

[Series 4: T2 · axial · 3.0mm · 0.57mm/px · 1 of 68 slices shown]
[im 1/68]
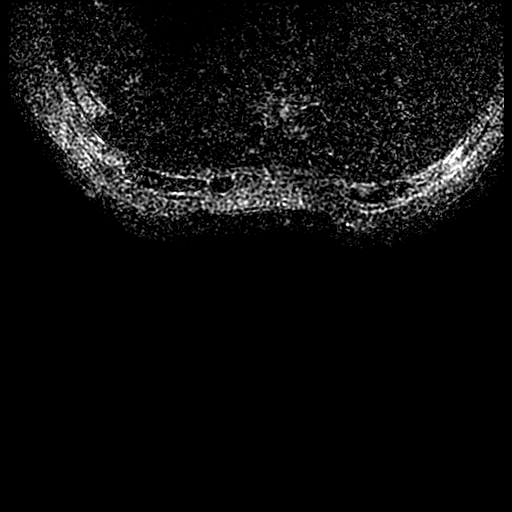

[Series 600: T1 fat-sat · axial · 1.8mm · 0.57mm/px · z∈[-125,+76]mm · 5 of 224 slices shown (1 of 3)]
[im 1/224]
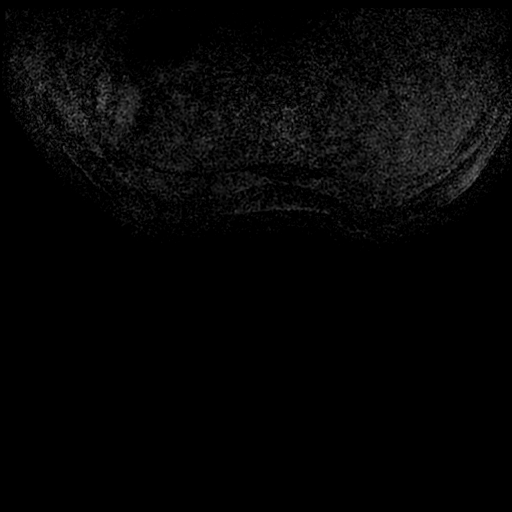
[im 56/224]
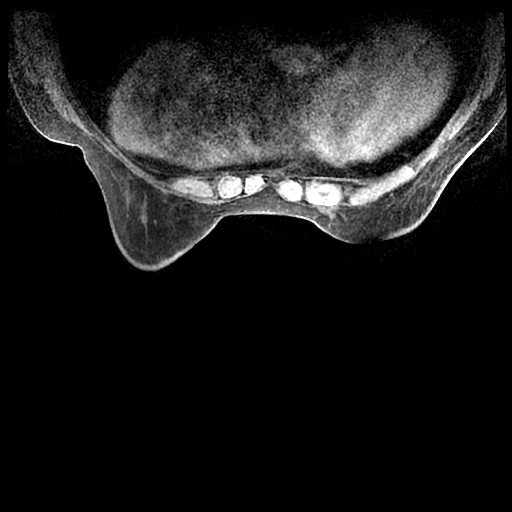
[im 112/224]
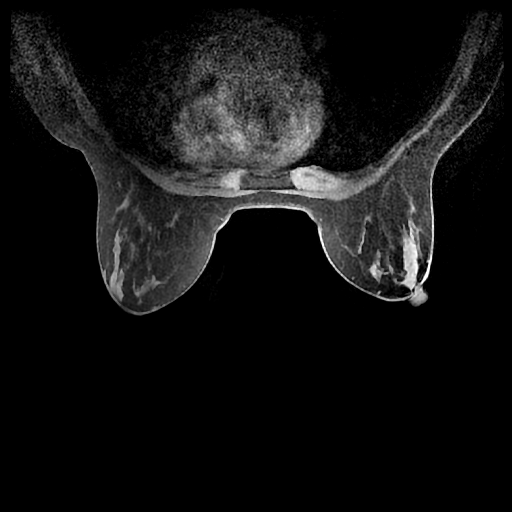
[im 168/224]
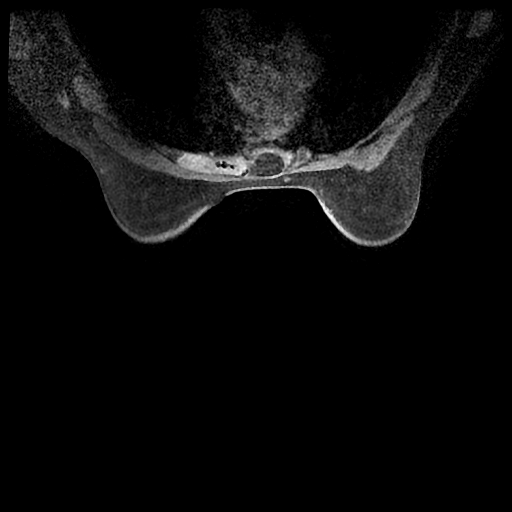
[im 224/224]
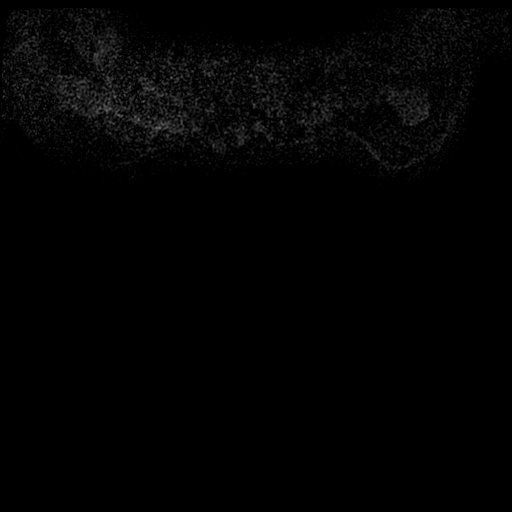

[Series 601: T1 fat-sat · axial · 1.8mm · 0.57mm/px · z∈[-125,+76]mm · 5 of 224 slices shown (2 of 3)]
[im 1/224]
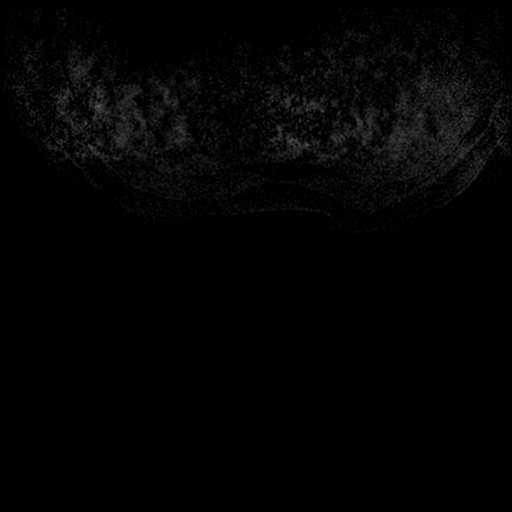
[im 56/224]
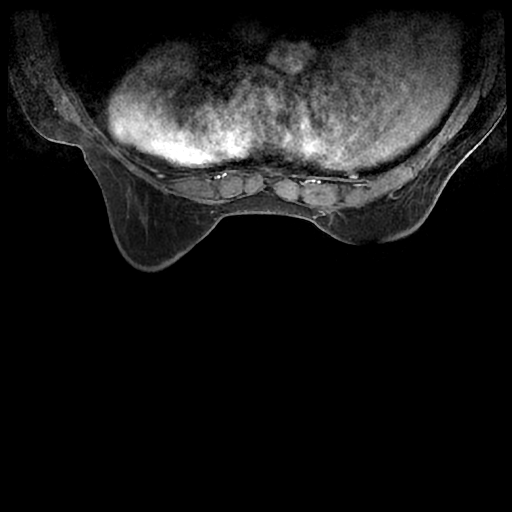
[im 112/224]
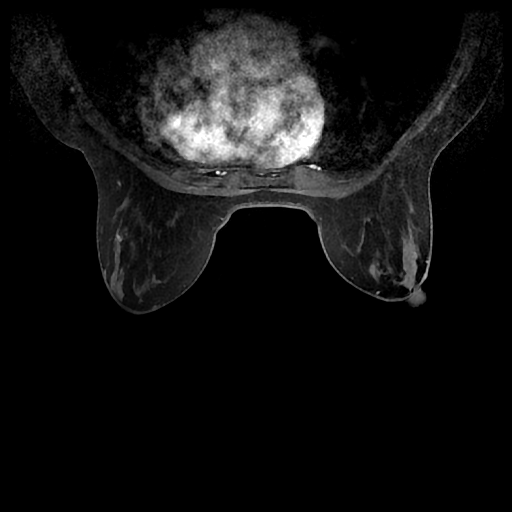
[im 168/224]
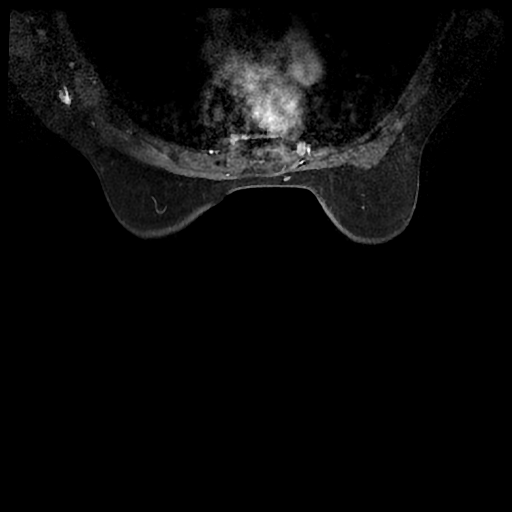
[im 224/224]
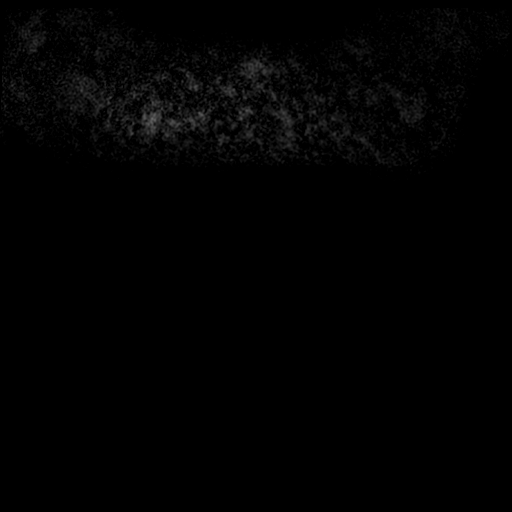

[Series 602: T1 fat-sat · axial · 1.8mm · 0.57mm/px · 1 of 224 slices shown (3 of 3)]
[im 1/224]
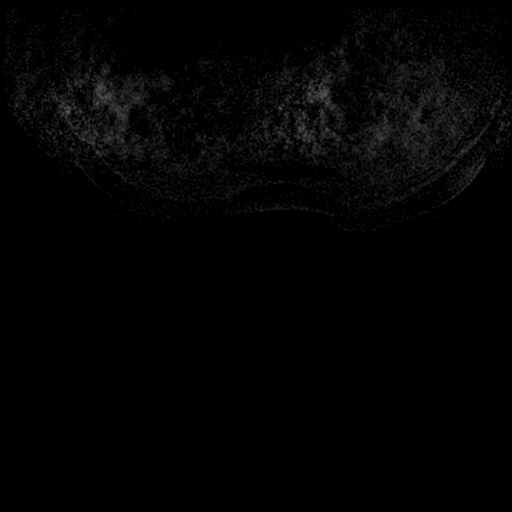

[18 of 48 positions shown; findings below may reference images not displayed]

Three-dimensional MR images were rendered by post-processing of the
original MR data on an independent workstation. The
three-dimensional MR images were interpreted, and findings are
reported in the following complete MRI report for this study. Three
dimensional images were evaluated at the independent [employer]
workstation
FINDINGS: Breast composition: c. Heterogeneous fibroglandular tissue.

Background parenchymal enhancement: Mild.

Right breast: Susceptibility artifact from post biopsy clip is
demonstrated in the lower outer quadrant of the right breast at
middle depth (subtraction images, series 9, slice 143/222). There is
surrounding post biopsy changes and non-mass enhancement. This is
consistent with the patient's biopsy-proven site of DCIS.

Non-mass enhancement extends from the site of biopsy anteriorly
towards the nipple by approximately 3 cm (subtraction images, series
9, slice 135/222). Additionally, several enhancing masses are seen
extending posteriorly from the biopsy site toward the chest wall.
The largest is seen in the lower outer quadrant at posterior depth
(subtraction images, series 9, slice 153/224). It measures 8 x 5 x 6
mm. Three additional, smaller 4-5 mm enhancing foci are seen
abutting the chest wall (subtraction images, series 9, slices 152
and 158 of 224). There is no enhancement of the underlying chest
wall or pectoralis muscle.

Left breast: There is an irregular 5 x 4 x 4 mm enhancing mass in
the upper outer left breast at middle depth (subtraction images,
series 9, slice 113/224). There is no associated T2 correlate. No
other mass or abnormal enhancement.

Lymph nodes: No abnormal appearing lymph nodes.

Ancillary findings:  None.
IMPRESSION: 1. Post biopsy changes in the lower outer right breast at middle
depth consistent with site of biopsy-proven DCIS.
2. Suspicious non-mass enhancement extends anteriorly from the
biopsy site towards the nipple by approximately 3 cm.
3. Suspicious enhancing masses/foci extend posteriorly from the
biopsy site toward the chest wall. This includes an 8 mm mass and
additional 4-5 mm foci abutting the chest wall.
4. Overall, suspicious findings in the lower outer quadrant of the
right breast span approximately 6 cm from anterior to posterior
depth.
5. Indeterminate 5 mm mass in the upper outer left breast.
6. No suspicious lymphadenopathy.

RECOMMENDATION:
1. MRI guided biopsy of 2 sites within the RIGHT breast to document
extent of disease in the AP dimension. This includes the anterior
most aspect of non-mass enhancement (slice 135/224) and the largest
enhancing mass posteriorly (slice 153/224). Additional smaller
enhancing foci located more posterior and abutting the chest wall
are likely not amenable to MRI guided biopsy.
2. MRI guided biopsy of an indeterminate 5 mm mass in the upper
outer LEFT breast.

BI-RADS CATEGORY  4: Suspicious.

## 2020-11-19 ENCOUNTER — Other Ambulatory Visit: Payer: Self-pay

## 2020-11-19 ENCOUNTER — Ambulatory Visit (INDEPENDENT_AMBULATORY_CARE_PROVIDER_SITE_OTHER): Payer: PPO

## 2020-11-19 VITALS — BP 128/76 | HR 75 | Temp 97.6°F | Ht 67.0 in | Wt 142.0 lb

## 2020-11-19 DIAGNOSIS — Z9013 Acquired absence of bilateral breasts and nipples: Secondary | ICD-10-CM | POA: Diagnosis not present

## 2020-11-20 DIAGNOSIS — Z171 Estrogen receptor negative status [ER-]: Secondary | ICD-10-CM | POA: Diagnosis not present

## 2020-11-20 DIAGNOSIS — C50911 Malignant neoplasm of unspecified site of right female breast: Secondary | ICD-10-CM | POA: Diagnosis not present

## 2020-11-25 NOTE — Patient Instructions (Signed)
Patient will use vaseline/xeroform & gauze dressing until sites are healed. She will call for any concerns

## 2020-11-25 NOTE — Progress Notes (Signed)
NIPPLE AREOLAR TATTOO PROCEDURE  PREOPERATIVE DIAGNOSIS:  Acquired absence of BILATERAL nipple areolar   POSTOPERATIVE DIAGNOSIS: Acquired absence of BILATERAL nipple areolar    PROCEDURES: BILATERAL nipple areolar tattoo touch-up  ATTENDING SURGEON: Dr. Lyndee Leo Dillingham  ANESTHESIA:  EMLA  COMPLICATIONS: None.  JUSTIFICATION FOR PROCEDURE:  [name] is a 65 y.o. female with a history of breast cancer status post bilateral breast reconstruction. The patient presents for nipple areolar complex tattoo touch-up. Risks, benefits, indications, and alternatives of the above described procedures were discussed with the patient and all the patient's questions were answered.   DESCRIPTION OF PROCEDURE: After informed consent was obtained and proper identification of patient and surgical site was made, the patient was taken to the procedure room and pre-procedure photos taken & entered into chart. The patient was then placed supine on the operating room table. A time out was performed to confirm patient's identity and surgical site. The patient was prepped and draped in the usual sterile fashion.  Using a # 7 round/pronged tattoo head, pigment was instilled to the designed nipple areolar complexes.  Once adequate pigment had been applied vaseline/xeroform & gauze dressing were applied. The patient tolerated the procedure well.   [name] used: [name] / [name] / [name] / [name] / [name] Duration used for topical anesthesia Lot #'s & exp dates on file

## 2020-11-26 ENCOUNTER — Ambulatory Visit: Payer: BC Managed Care – PPO

## 2020-12-15 ENCOUNTER — Ambulatory Visit: Payer: BC Managed Care – PPO

## 2020-12-15 DIAGNOSIS — H43813 Vitreous degeneration, bilateral: Secondary | ICD-10-CM | POA: Diagnosis not present

## 2020-12-15 DIAGNOSIS — H35433 Paving stone degeneration of retina, bilateral: Secondary | ICD-10-CM | POA: Diagnosis not present

## 2020-12-15 DIAGNOSIS — H3562 Retinal hemorrhage, left eye: Secondary | ICD-10-CM | POA: Diagnosis not present

## 2020-12-15 DIAGNOSIS — H16223 Keratoconjunctivitis sicca, not specified as Sjogren's, bilateral: Secondary | ICD-10-CM | POA: Diagnosis not present

## 2020-12-15 DIAGNOSIS — H04123 Dry eye syndrome of bilateral lacrimal glands: Secondary | ICD-10-CM | POA: Diagnosis not present

## 2020-12-15 DIAGNOSIS — H2513 Age-related nuclear cataract, bilateral: Secondary | ICD-10-CM | POA: Diagnosis not present

## 2020-12-15 DIAGNOSIS — H25013 Cortical age-related cataract, bilateral: Secondary | ICD-10-CM | POA: Diagnosis not present

## 2020-12-22 ENCOUNTER — Ambulatory Visit: Payer: BC Managed Care – PPO

## 2021-02-09 DIAGNOSIS — H16223 Keratoconjunctivitis sicca, not specified as Sjogren's, bilateral: Secondary | ICD-10-CM | POA: Diagnosis not present

## 2021-02-13 DIAGNOSIS — R5383 Other fatigue: Secondary | ICD-10-CM | POA: Diagnosis not present

## 2021-02-13 DIAGNOSIS — E559 Vitamin D deficiency, unspecified: Secondary | ICD-10-CM | POA: Diagnosis not present

## 2021-02-13 DIAGNOSIS — E785 Hyperlipidemia, unspecified: Secondary | ICD-10-CM | POA: Diagnosis not present

## 2021-02-17 ENCOUNTER — Ambulatory Visit: Payer: BC Managed Care – PPO | Admitting: Plastic Surgery

## 2021-02-20 ENCOUNTER — Other Ambulatory Visit: Payer: Self-pay

## 2021-02-20 ENCOUNTER — Encounter: Payer: Self-pay | Admitting: Plastic Surgery

## 2021-02-20 ENCOUNTER — Ambulatory Visit: Payer: PPO | Admitting: Plastic Surgery

## 2021-02-20 ENCOUNTER — Ambulatory Visit (INDEPENDENT_AMBULATORY_CARE_PROVIDER_SITE_OTHER): Payer: Self-pay | Admitting: Plastic Surgery

## 2021-02-20 DIAGNOSIS — Z719 Counseling, unspecified: Secondary | ICD-10-CM

## 2021-02-20 DIAGNOSIS — Z9889 Other specified postprocedural states: Secondary | ICD-10-CM

## 2021-02-20 NOTE — Progress Notes (Addendum)
Patient ID: [name], female    DOB: April 09, 1955, 65 y.o.   MRN: [id number]   Chief Complaint  Patient presents with   Breast Problem    The patient is a 65 year old female here for a 1 year follow-up on her breast reconstruction.  She had bilateral mastectomies with implant-based reconstruction.  In July 2021 she had ultrahigh profile Mentor implants placed 455 cc.  She is very pleased with her reconstruction and had some fat grafting in December 2021 she also had nipple areolar tattooing.  She is very pleased overall and feels very comfortable.  There are no areas of concern.  She notices some tightness in the right axilla and slight restriction of range of motion.   Review of Systems  Constitutional: Negative.   Eyes: Negative.   Respiratory: Negative.    Cardiovascular: Negative.   Gastrointestinal: Negative.   Endocrine: Negative.   Genitourinary: Negative.   Neurological:  Positive for facial asymmetry.  Hematological: Negative.   Psychiatric/Behavioral: Negative.     Past Medical History:  Diagnosis Date   Arthritis    Backache, unspecified    Cancer ([place])    breast s/p BL mastectomies   Colon polyps    Hyperlipidemia    PONV (postoperative nausea and vomiting)     Past Surgical History:  Procedure Laterality Date   BREAST RECONSTRUCTION WITH PLACEMENT OF TISSUE EXPANDER AND FLEX HD (ACELLULAR HYDRATED DERMIS) Bilateral 04/05/2019   Procedure: BILATERAL BREAST RECONSTRUCTION WITH PLACEMENT OF TISSUE EXPANDER AND FLEX HD (ACELLULAR HYDRATED DERMIS);  Surgeon: Wallace Going, DO;  Location: [facility];  Service: Plastics;  Laterality: Bilateral;   ENDOMETRIAL ABLATION  1998   KNEE SURGERY Right 2007   LIPOSUCTION WITH LIPOFILLING Bilateral 01/30/2020   Procedure: LIPOSUCTION WITH LIPOFILLING;  Surgeon: Wallace Going, DO;  Location: [facility];  Service: Plastics;  Laterality: Bilateral;  90 min, please   MASTECTOMY  W/ SENTINEL NODE BIOPSY Bilateral 04/05/2019   Procedure: BILATERAL MASTECTOMIES WITH RIGHT SENTINEL LYMPH NODE BIOPSY;  Surgeon: [name], MD;  Location: [facility];  Service: General;  Laterality: Bilateral;   REMOVAL OF BILATERAL TISSUE EXPANDERS WITH PLACEMENT OF BILATERAL BREAST IMPLANTS Bilateral 09/26/2019   Procedure: REMOVAL OF BILATERAL TISSUE EXPANDERS WITH PLACEMENT OF BILATERAL BREAST IMPLANTS;  Surgeon: Wallace Going, DO;  Location: [facility];  Service: Plastics;  Laterality: Bilateral;   RHINOPLASTY  1977   TONSILLECTOMY  1961      Current Outpatient Medications:    calcium carbonate (OSCAL) 1500 (600 Ca) MG TABS tablet, Take by mouth., Disp: , Rfl:    famotidine (PEPCID) 40 MG tablet, Take 40 mg by mouth daily., Disp: , Rfl:    gabapentin (NEURONTIN) 100 MG capsule, Take 100 mg by mouth 3 (three) times daily., Disp: , Rfl:    Multiple Vitamin (MULTI-VITAMIN DAILY PO), Take by mouth. , Disp: , Rfl:    Omega-3 Fatty Acids (FISH OIL) 1000 MG CAPS, Take by mouth., Disp: , Rfl:    ondansetron (ZOFRAN) 4 MG tablet, Take 1 tablet (4 mg total) by mouth every 8 (eight) hours as needed for nausea or vomiting., Disp: 20 tablet, Rfl: 0   rosuvastatin (CRESTOR) 10 MG tablet, Take 10 mg by mouth at bedtime., Disp: , Rfl:    Objective:   There were no vitals filed for this visit.  Physical Exam Constitutional:      Appearance: Normal appearance.  HENT:  Head: Normocephalic and atraumatic.  Cardiovascular:     Rate and Rhythm: Normal rate.     Pulses: Normal pulses.  Pulmonary:     Effort: Pulmonary effort is normal. No respiratory distress.  Abdominal:     General: There is no distension.     Palpations: Abdomen is soft.     Tenderness: There is no abdominal tenderness. There is no guarding.  Musculoskeletal:        General: No swelling or deformity.  Skin:    General: Skin is warm.     Capillary Refill: Capillary refill takes less  than 2 seconds.     Coloration: Skin is not jaundiced.     Findings: No bruising.  Neurological:     Mental Status: She is alert and oriented to person, place, and time.  Psychiatric:        Mood and Affect: Mood normal.        Behavior: Behavior normal.        Thought Content: Thought content normal.        Judgment: Judgment normal.    Assessment & Plan:  Encounter for counseling  S/P breast reconstruction, bilateral  Continue with self exams and sports bras for the most part.  She is going to regular bra for special occasions.  I would like to see her back in 1 year.  At any point we can do an ultrasound if there is concern otherwise we would do an ultrasound every 3 years for monitoring.  Will order PT. [place], DO

## 2021-02-20 NOTE — Progress Notes (Signed)

## 2021-02-20 NOTE — Addendum Note (Signed)
Addended by: Wallace Going on: 02/20/2021 02:05 PM   Modules accepted: Orders

## 2021-03-10 NOTE — Therapy (Signed)
OUTPATIENT PHYSICAL THERAPY SHOULDER EVALUATION   Patient Name: [name] MRN: [id number] DOB:[birth year], 66 y.o., female Today's Date: 03/13/2021   PT End of Session - 03/13/21 1015     Visit Number 1    Number of Visits 12    Date for PT Re-Evaluation 04/24/21    Authorization Type [employer]    PT Start Time 1005    PT Stop Time 1045    PT Time Calculation (min) 40 min    Activity Tolerance Patient tolerated treatment well    Behavior During Therapy WFL for tasks assessed/performed             Past Medical History:  Diagnosis Date   Arthritis    Backache, unspecified    Cancer ([place])    breast s/p BL mastectomies   Colon polyps    Hyperlipidemia    PONV (postoperative nausea and vomiting)    Past Surgical History:  Procedure Laterality Date   BREAST RECONSTRUCTION WITH PLACEMENT OF TISSUE EXPANDER AND FLEX HD (ACELLULAR HYDRATED DERMIS) Bilateral 04/05/2019   Procedure: BILATERAL BREAST RECONSTRUCTION WITH PLACEMENT OF TISSUE EXPANDER AND FLEX HD (ACELLULAR HYDRATED DERMIS);  Surgeon: Wallace Going, DO;  Location: [facility];  Service: Plastics;  Laterality: Bilateral;   ENDOMETRIAL ABLATION  1998   KNEE SURGERY Right 2007   LIPOSUCTION WITH LIPOFILLING Bilateral 01/30/2020   Procedure: LIPOSUCTION WITH LIPOFILLING;  Surgeon: Wallace Going, DO;  Location: [facility];  Service: Plastics;  Laterality: Bilateral;  90 min, please   MASTECTOMY W/ SENTINEL NODE BIOPSY Bilateral 04/05/2019   Procedure: BILATERAL MASTECTOMIES WITH RIGHT SENTINEL LYMPH NODE BIOPSY;  Surgeon: [name], MD;  Location: [facility];  Service: General;  Laterality: Bilateral;   REMOVAL OF BILATERAL TISSUE EXPANDERS WITH PLACEMENT OF BILATERAL BREAST IMPLANTS Bilateral 09/26/2019   Procedure: REMOVAL OF BILATERAL TISSUE EXPANDERS WITH PLACEMENT OF BILATERAL BREAST IMPLANTS;  Surgeon: Wallace Going, DO;  Location:  [facility];  Service: Plastics;  Laterality: Bilateral;   [place]   Patient Active Problem List   Diagnosis Date Noted   S/P breast reconstruction, bilateral 09/30/2019   S/P mastectomy, bilateral 04/13/2019   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   Weight gain 09/09/2013   Encounter for counseling 11/25/2012   Screening for malignant neoplasm of the cervix 11/25/2012   Menopause 10/08/2012   Unspecified vitamin D deficiency 10/08/2012   Hyperlipemia 10/08/2012   Osteoarthritis 06/04/2012    PCP: Azzie Glatter, FNP  REFERRING PROVIDER: Wallace Going, DO  REFERRING DIAG: S/P breast reconstruction, bilateral  THERAPY DIAG:  Chronic right shoulder pain  Muscle weakness (generalized)  ONSET DATE: 04/05/2019  SUBJECTIVE:  SUBJECTIVE STATEMENT: Patient reports she still has pain on the right arm and across right breast at the top. She did have therapy in the past right after surgeries. Pain is constant without any real aggravating factors. States some days it is just more than others. Patient reports she travels for her job and being in the car for long periods is kind-of uncomfortable.  PERTINENT HISTORY: Bilateral mastectomies with implant-based reconstruction, lymph node removal on right  PAIN:  Are you having pain? Yes VAS scale: 5/10 (7/10 at worst) Pain location: Right axilla, breast region Pain orientation: Right  PAIN TYPE: Chronic Pain description: Constant, pinching/wrenching sensation Aggravating factors: None reported, pain is constant Relieving factors: Medication (gabapentin), pressure to lift right breast  PRECAUTIONS: None  WEIGHT BEARING RESTRICTIONS No  FALLS:  Has patient fallen in last 6 months? 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, driving  PLOF: Independent  PATIENT GOALS: Pain relief    OBJECTIVE:  DIAGNOSTIC FINDINGS:  N/A  PATIENT SURVEYS:  FOTO 59 % functional status  COGNITION: Overall cognitive status: Within functional limits for tasks assessed     SENSATION:  Light touch: Appears intact  POSTURE: Rounded shoulder posture, right slightly more elevated compared to left  PALPATION: Tender right lat, subscap, pec, upper trap  UPPER EXTREMITY AROM:  Patient reported increased pulling in axillary and chest region with all right shoulder AROM AROM Right 03/13/2021 Left 03/13/2021  Shoulder flexion 140 160  Shoulder extension 60 60  Shoulder abduction 145 170  Shoulder internal rotation T12 T8  Shoulder external rotation T1 T2   Right shoulder PROM grossly WFL, slight limitation with flexion compared to left side, increased stretch when shoulder externally rotated and then flexed overhead  UPPER EXTREMITY MMT:  MMT Right 03/13/2021 Left 03/13/2021  Shoulder flexion 4+ 5  Shoulder extension 4+ 5  Shoulder abduction 4+ 5  Shoulder ER 4+ 5  Shoulder IR 4+ 5  Periscap musculature 4 4   JOINT MOBILITY TESTING:  Not assessed   TODAY'S TREATMENT:  Sidelying thoracic rotation x 10 each SMFR using tennis ball for lat and pec Doorway pec stretch 2 x 30 sec Row with green x 10 ER/IR with green x 10   PATIENT EDUCATION: Education details: Exam findings, POC, HEP, use of dry needling to address muscular tightness and pain Person educated: Patient Education method: Explanation, Demonstration, Tactile cues, Verbal cues, and Handouts Education comprehension: verbalized understanding, returned demonstration, verbal cues required, tactile cues required, and needs further education  HOME EXERCISE PROGRAM: Access Code: [email or web address]   ASSESSMENT: CLINICAL IMPRESSION: Patient is  a 66 y.o. female who was seen today for physical therapy evaluation and treatment for right axillary and chest/breat pain. Her symptoms seem to be related to tightness/trigger points of lat and sub scap with postural strength and thoracic mobility deficits. Objective impairments include decreased ROM, decreased strength, impaired flexibility, postural dysfunction, and pain. These impairments are limiting patient from cleaning, community activity, driving, meal prep, occupation, and shopping. Personal factors including Past/current experiences and Time since onset of injury/illness/exacerbation are also affecting patient's functional outcome. Patient will benefit from skilled PT to address above impairments and improve overall function.  REHAB POTENTIAL: Good  CLINICAL DECISION MAKING: Stable/uncomplicated  EVALUATION COMPLEXITY: Low   GOALS: Goals reviewed with patient? Yes  SHORT TERM GOALS:  STG Name Target Date Goal status  1 Patient will be I with initial HEP in  order to progress with therapy. Baseline: provided at eval 04/03/2021 INITIAL  2 PT will review FOTO with patient by 3rd visit in order to understand expected progress and outcome with therapy. Baseline:  assessed at eval 04/03/2021 INITIAL  3 Patient will report resting pain level </= 3/10 in order to reduce function limitations Baseline: 5/10 04/03/2021 INITIAL   LONG TERM GOALS:   LTG Name Target Date Goal status  1 Patient will be I with final HEP to maintain progress from PT. Baseline: provided at eval 04/24/2021 INITIAL  2 Patient will report >/= 67% status on FOTO to indicate improved functional ability. Baseline: 59% functional status 04/24/2021 INITIAL  3 Patient will improve shoulder AROM to 160 deg in order to allow for no pulling in the axilla when reaching into a cupboard Baseline: 140 deg 04/24/2021 INITIAL  4 Patient will demonstrate shoulder strength to 5/5 MMT in order to improve lifting ability Baseline: 4+/5 MMT  and 4/5 MMT periscapular musculature 04/24/2021 INITIAL  5 Patient will report pain level </= 1/10 at rest to reduce functional limitations Baseline: 5/10      PLAN: PT FREQUENCY: 2x/week  PT DURATION: 6 weeks  PLANNED INTERVENTIONS: Therapeutic exercises, Therapeutic activity, Neuro Muscular re-education, Balance training, Gait training, Patient/Family education, Joint mobilization, Dry Needling, Spinal mobilization, and Manual therapy  PLAN FOR NEXT SESSION: Review HEP and progress PRN, dry needling / manual for right lat/subscap/pec/upper trap regions, stretching and postural strengthening   Hilda Blades, PT, DPT, LAT, ATC 03/13/21  11:45 AM Phone: [phone] Fax: [phone]

## 2021-03-13 ENCOUNTER — Other Ambulatory Visit: Payer: Self-pay

## 2021-03-13 ENCOUNTER — Ambulatory Visit: Payer: PPO | Attending: Plastic Surgery | Admitting: Physical Therapy

## 2021-03-13 ENCOUNTER — Encounter: Payer: Self-pay | Admitting: Physical Therapy

## 2021-03-13 DIAGNOSIS — G8929 Other chronic pain: Secondary | ICD-10-CM | POA: Diagnosis present

## 2021-03-13 DIAGNOSIS — Z9889 Other specified postprocedural states: Secondary | ICD-10-CM | POA: Insufficient documentation

## 2021-03-13 DIAGNOSIS — M25511 Pain in right shoulder: Secondary | ICD-10-CM | POA: Diagnosis not present

## 2021-03-13 DIAGNOSIS — M6281 Muscle weakness (generalized): Secondary | ICD-10-CM | POA: Insufficient documentation

## 2021-03-13 NOTE — Patient Instructions (Signed)
Access Code: [email or web address] URL: [email or web address] Date: 03/13/2021 Prepared by: Hilda Blades  Exercises Sidelying Thoracic and Shoulder Rotation - 2 x daily - 7 x weekly - 10 reps - 3 seconds hold Standing Glute Med Mobilization with Small Ball on Wall - 2 x daily - 2-3 minutes hold Chest Mobilization with Small Ball - 2 x daily - 2-3 minutes hold Doorway Pec Stretch at 90 Degrees Abduction - 2 x daily - 7 x weekly - 2-3 reps - 30 seconds hold Standing Bilateral Low Shoulder Row with Anchored Resistance - 1 x daily - 7 x weekly - 2-3 sets - 10-15 reps Shoulder External Rotation with Anchored Resistance - 1 x daily - 7 x weekly - 2-3 sets - 10 reps Shoulder Internal Rotation with Resistance - 1 x daily - 7 x weekly - 2-3 sets - 10 reps

## 2021-03-19 ENCOUNTER — Other Ambulatory Visit: Payer: Self-pay

## 2021-03-19 ENCOUNTER — Ambulatory Visit: Payer: PPO

## 2021-03-19 DIAGNOSIS — M25511 Pain in right shoulder: Secondary | ICD-10-CM | POA: Diagnosis not present

## 2021-03-19 DIAGNOSIS — M6281 Muscle weakness (generalized): Secondary | ICD-10-CM

## 2021-03-19 DIAGNOSIS — G8929 Other chronic pain: Secondary | ICD-10-CM

## 2021-03-19 NOTE — Therapy (Signed)
OUTPATIENT PHYSICAL THERAPY TREATMENT NOTE   Patient Name: [name] MRN: [id number] DOB:1955-11-19, 66 y.o., female Today's Date: 03/19/2021  PCP: Azzie Glatter, FNP REFERRING PROVIDER: Wallace Going, DO   PT End of Session - 03/19/21 1700     Visit Number 2    Number of Visits 12    Date for PT Re-Evaluation 04/24/21    Authorization Type [employer]    PT Start Time 1702    PT Stop Time 1742    PT Time Calculation (min) 40 min    Activity Tolerance Patient tolerated treatment well    Behavior During Therapy WFL for tasks assessed/performed             Past Medical History:  Diagnosis Date   Arthritis    Backache, unspecified    Cancer ([place])    breast s/p BL mastectomies   Colon polyps    Hyperlipidemia    PONV (postoperative nausea and vomiting)    Past Surgical History:  Procedure Laterality Date   BREAST RECONSTRUCTION WITH PLACEMENT OF TISSUE EXPANDER AND FLEX HD (ACELLULAR HYDRATED DERMIS) Bilateral 04/05/2019   Procedure: BILATERAL BREAST RECONSTRUCTION WITH PLACEMENT OF TISSUE EXPANDER AND FLEX HD (ACELLULAR HYDRATED DERMIS);  Surgeon: Wallace Going, DO;  Location: [facility];  Service: Plastics;  Laterality: Bilateral;   ENDOMETRIAL ABLATION  1998   KNEE SURGERY Right 2007   LIPOSUCTION WITH LIPOFILLING Bilateral 01/30/2020   Procedure: LIPOSUCTION WITH LIPOFILLING;  Surgeon: Wallace Going, DO;  Location: [facility];  Service: Plastics;  Laterality: Bilateral;  90 min, please   MASTECTOMY W/ SENTINEL NODE BIOPSY Bilateral 04/05/2019   Procedure: BILATERAL MASTECTOMIES WITH RIGHT SENTINEL LYMPH NODE BIOPSY;  Surgeon: [name], MD;  Location: [facility];  Service: General;  Laterality: Bilateral;   REMOVAL OF BILATERAL TISSUE EXPANDERS WITH PLACEMENT OF BILATERAL BREAST IMPLANTS Bilateral 09/26/2019   Procedure: REMOVAL OF BILATERAL TISSUE EXPANDERS WITH PLACEMENT OF  BILATERAL BREAST IMPLANTS;  Surgeon: Wallace Going, DO;  Location: [facility];  Service: Plastics;  Laterality: Bilateral;   [place]   Patient Active Problem List   Diagnosis Date Noted   S/P breast reconstruction, bilateral 09/30/2019   S/P mastectomy, bilateral 04/13/2019   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 She reports the tennis ball has really helped to decrease her pain.    PERTINENT HISTORY: Bilateral mastectomies with implant-based reconstruction, lymph node removal on right   PAIN:  Are you having pain? Yes VAS scale: 3-4/10 Pain location: Right axilla, breast region Pain orientation: Right  PAIN TYPE: Chronic Pain description: tight, wrenching  Aggravating factors: unknown Relieving factors: exercises, soft tissue work    PRECAUTIONS: None   WEIGHT BEARING RESTRICTIONS No  OBJECTIVE:  *Unless otherwise noted all objective measures were captured on initial evaluation.    DIAGNOSTIC FINDINGS:  N/A   PATIENT SURVEYS:  FOTO 59 % functional status   COGNITION: Overall cognitive status: Within functional limits for tasks assessed                                SENSATION:          Light touch: Appears intact   POSTURE: Rounded shoulder posture, right slightly more elevated compared to left   PALPATION: Tender right lat, subscap, pec, upper trap   UPPER EXTREMITY AROM:   Patient reported increased pulling in axillary and chest region with all right shoulder AROM AROM Right 03/13/2021 Left 03/13/2021 03/19/21  Shoulder flexion 140 160 Rt 150  Shoulder extension 60 60   Shoulder abduction 145 170 Rt 155  Shoulder internal rotation T12 T8   Shoulder external rotation T1 T2     Right shoulder PROM grossly WFL, slight limitation with flexion compared to left side, increased stretch when shoulder externally rotated and then flexed overhead   UPPER EXTREMITY MMT:   MMT Right 03/13/2021 Left 03/13/2021  Shoulder flexion 4+ 5  Shoulder extension 4+ 5  Shoulder abduction 4+ 5  Shoulder ER 4+ 5  Shoulder IR 4+ 5  Periscap musculature 4 4    JOINT MOBILITY TESTING:  Not assessed     TODAY'S TREATMENT:    [facility] Adult PT Treatment:                                                DATE: 03/19/21 Therapeutic Exercise: Ardine Eng pose 60 sec  Seated thoracic extension 1 x 10  Resisted ER and IR LUE 1 x 10 green band  Bilateral ER red band 2 x 15 Resisted horizontal abduction 2 x 10 red band  Updated HEP  Manual Therapy: STM/DTM to anterior chest musculature Latissimus dorsi ATR    PATIENT EDUCATION: Education details: [place] Person educated: Patient Education method: Explanation Education comprehension: verbalized understanding   HOME EXERCISE PROGRAM: Access Code: [email or web address]     ASSESSMENT: CLINICAL IMPRESSION: Patient arrives reporting overall improvements in her pain and tightness since the initial evaluation. She demonstrates improvements in Rt shoulder flexion and abduction AROM, though mild limitation remains. She has moderate tautness present about pectoral musculature and latissimus dorsi with partial release from manual therapy  today. Able to progress shoulder/periscapular strengthening without reports of pain and minimal postural cues required.    REHAB POTENTIAL: Good   CLINICAL DECISION MAKING: Stable/uncomplicated   EVALUATION COMPLEXITY: Low     GOALS: Goals reviewed with patient? Yes   SHORT TERM GOALS:   STG Name Target Date Goal status  1 Patient will be  I with initial HEP in order to progress with therapy. Baseline: provided at eval 04/03/2021 ONGOING  2 PT will review FOTO with patient by 3rd visit in order to understand expected progress and outcome with therapy. Baseline:  assessed at eval 04/03/2021 ACHIEVED  3 Patient will report resting pain level </= 3/10 in order to reduce function limitations Baseline: 5/10 04/03/2021 INITIAL    LONG TERM GOALS:    LTG Name Target Date Goal status  1 Patient will be I with final HEP to maintain progress from PT. Baseline: provided at eval 04/24/2021 INITIAL  2 Patient will report >/= 67% status on FOTO to indicate improved functional ability. Baseline: 59% functional status 04/24/2021 INITIAL  3 Patient will improve shoulder AROM to 160 deg in order to allow for no pulling in the axilla when reaching into a cupboard Baseline: 140 deg 04/24/2021 INITIAL  4 Patient will demonstrate shoulder strength to 5/5 MMT in order to improve lifting ability Baseline: 4+/5 MMT and 4/5 MMT periscapular musculature 04/24/2021 INITIAL  5 Patient will report pain level </= 1/10 at rest to reduce functional limitations Baseline: 5/10          PLAN: PT FREQUENCY: 2x/week   PT DURATION: 6 weeks   PLANNED INTERVENTIONS: Therapeutic exercises, Therapeutic activity, Neuro Muscular re-education, Balance training, Gait training, Patient/Family education, Joint mobilization, Dry Needling, Spinal mobilization, and Manual therapy   PLAN FOR NEXT SESSION: Review HEP and progress PRN, dry needling / manual for right lat/subscap/pec/upper trap regions, stretching and postural  strengthening Gwendolyn Grant, PT, DPT, ATC 03/19/21 5:45 PM

## 2021-03-23 NOTE — Therapy (Signed)
OUTPATIENT PHYSICAL THERAPY TREATMENT NOTE   Patient Name: [name] MRN: [id number] DOB:Jan 23, 1956, 66 y.o., female Today's Date: 03/24/2021  PCP: Azzie Glatter, FNP REFERRING PROVIDER: Azzie Glatter, FNP   PT End of Session - 03/24/21 1702     Visit Number 3    Number of Visits 12    Date for PT Re-Evaluation 04/24/21    Authorization Type [employer]    PT Start Time 1702    PT Stop Time 1742    PT Time Calculation (min) 40 min    Activity Tolerance Patient tolerated treatment well    Behavior During Therapy WFL for tasks assessed/performed              Past Medical History:  Diagnosis Date   Arthritis    Backache, unspecified    Cancer ([place])    breast s/p BL mastectomies   Colon polyps    Hyperlipidemia    PONV (postoperative nausea and vomiting)    Past Surgical History:  Procedure Laterality Date   BREAST RECONSTRUCTION WITH PLACEMENT OF TISSUE EXPANDER AND FLEX HD (ACELLULAR HYDRATED DERMIS) Bilateral 04/05/2019   Procedure: BILATERAL BREAST RECONSTRUCTION WITH PLACEMENT OF TISSUE EXPANDER AND FLEX HD (ACELLULAR HYDRATED DERMIS);  Surgeon: Wallace Going, DO;  Location: [facility];  Service: Plastics;  Laterality: Bilateral;   ENDOMETRIAL ABLATION  1998   KNEE SURGERY Right 2007   LIPOSUCTION WITH LIPOFILLING Bilateral 01/30/2020   Procedure: LIPOSUCTION WITH LIPOFILLING;  Surgeon: Wallace Going, DO;  Location: [facility];  Service: Plastics;  Laterality: Bilateral;  90 min, please   MASTECTOMY W/ SENTINEL NODE BIOPSY Bilateral 04/05/2019   Procedure: BILATERAL MASTECTOMIES WITH RIGHT SENTINEL LYMPH NODE BIOPSY;  Surgeon: [name], MD;  Location: [facility];  Service: General;  Laterality: Bilateral;   REMOVAL OF BILATERAL TISSUE EXPANDERS WITH PLACEMENT OF BILATERAL BREAST IMPLANTS Bilateral 09/26/2019   Procedure: REMOVAL OF BILATERAL TISSUE EXPANDERS WITH PLACEMENT OF  BILATERAL BREAST IMPLANTS;  Surgeon: Wallace Going, DO;  Location: [facility] [place] [facility];  Service: Plastics;  Laterality: Bilateral;   [place]   Patient Active Problem List   Diagnosis Date Noted   S/P breast reconstruction, bilateral 09/30/2019   S/P mastectomy, bilateral 04/13/2019   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   Weight gain 09/09/2013   Encounter for counseling 11/25/2012   Screening for malignant neoplasm of the cervix 11/25/2012   Menopause 10/08/2012   Unspecified vitamin D deficiency 10/08/2012   Hyperlipemia 10/08/2012   Osteoarthritis 06/04/2012    REFERRING PROVIDER: Wallace Going, DO   REFERRING DIAG: S/P breast reconstruction, bilateral   THERAPY DIAG:  Chronic right shoulder pain   Muscle weakness (generalized)   ONSET DATE: 04/05/2019  PERTINENT HISTORY: Bilateral mastectomies with implant-based reconstruction, lymph node removal on right  PRECAUTIONS: None  WEIGHT BEARING RESTRICTIONS No    SUBJECTIVE: Patient reports she feels she has improved a lot. She has been a little sore today but overall improved. The exercises are continuing to do well.   PAIN:  Are you having pain? Yes VAS scale: 3-4/10 Pain location: Right axilla, breast region Pain orientation: Right  PAIN TYPE: Chronic Pain description: tight, wrenching  Aggravating factors: unknown Relieving factors: exercises, soft tissue work   PATIENT GOALS: Pain relief    OBJECTIVE: (BOLDED MEASURES ASSESSED THIS VISIT) SURVEYS:  FOTO 59 % functional status   POSTURE:  Rounded shoulder posture, right slightly more elevated compared to left   PALPATION: Tender right lat, subscap, pec, upper trap   UPPER EXTREMITY AROM:   Patient reported increased pulling in axillary and chest region with all right shoulder AROM AROM Right 03/13/2021 Left 03/13/2021  03/19/21  Shoulder flexion 140 160 Rt 150  Shoulder  extension 60 60   Shoulder abduction 145 170 Rt 155  Shoulder internal rotation T12 T8   Shoulder external rotation T1 T2     Right shoulder PROM grossly WFL, slight limitation with flexion compared to left side, increased stretch when shoulder externally rotated and then flexed overhead   UPPER EXTREMITY MMT:   MMT Right 03/13/2021 Left 03/13/2021  Shoulder flexion 4+ 5  Shoulder extension 4+ 5  Shoulder abduction 4+ 5  Shoulder ER 4+ 5  Shoulder IR 4+ 5  Periscap musculature 4 4     TODAY'S TREATMENT:  03/24/2021: Therapeutic Exercise: UBE L1 x 4 min (2 fwd/bwd) while taking subjective Wall slide flexion with forward lean for shoulder stretch 5 x 5 sec hold Doorway pec stretch at 90 deg 3 x 30 sec Thoracic extension over FR Sidelying thoracic rotation x 5 each Manual Therapy: STM and pinch/stretch MFR for lat, subscap, pec major GHJ mobs in inferior/posterior directions at various ranges of elevation Shoulder PROM all directions   03/19/21: Therapeutic Exercise: Ardine Eng pose 60 sec  Seated thoracic extension 1 x 10  Resisted ER and IR LUE 1 x 10 green band  Bilateral ER red band 2 x 15 Resisted horizontal abduction 2 x 10 red band  Updated HEP Manual Therapy: STM/DTM to anterior chest musculature Latissimus dorsi ATR     PATIENT EDUCATION: Education details: HEP Person educated: Patient Education method: Explanation Education comprehension: verbalized understanding   HOME EXERCISE PROGRAM: Access Code: [email or web address]     ASSESSMENT: CLINICAL IMPRESSION: Patient tolerated therapy well with no adverse effects. Therapy focused primarily on improve shoulder and thoracic mobility and active motion. She continues to report improvement since initial eval but she did note mild pinching sensation with no apparent mechanism. She tolerated manual and stretching well, did not perform any resisted exercises this visit. No changes to HEP this visit. Patient would benefit from  continued skilled PT to progress mobility and strength in order to reduce pain and maximize functional ability.     GOALS: Goals reviewed with patient? Yes   SHORT TERM GOALS:   STG Name Target Date Goal status  1 Patient will be I with initial HEP in order to progress with therapy. Baseline: provided at eval 04/03/2021 ONGOING  2 PT will review FOTO with patient by 3rd visit in order to understand expected progress and outcome with therapy. Baseline:  assessed at eval 04/03/2021 ACHIEVED  3 Patient will report resting pain level </= 3/10 in order to reduce function limitations Baseline: 5/10 04/03/2021 INITIAL    LONG TERM GOALS:    LTG Name Target Date Goal status  1 Patient will be I with final HEP to maintain progress from PT. Baseline: provided at eval 04/24/2021 INITIAL  2 Patient will report >/= 67% status on FOTO to indicate improved functional ability. Baseline: 59% functional status 04/24/2021 INITIAL  3 Patient will improve shoulder AROM to 160 deg in order to allow for no pulling in the axilla when reaching into a cupboard Baseline: 140 deg 04/24/2021 INITIAL  4 Patient will demonstrate shoulder strength to 5/5 MMT in order to improve lifting ability Baseline: 4+/5 MMT and 4/5  MMT periscapular musculature 04/24/2021 INITIAL  5 Patient will report pain level </= 1/10 at rest to reduce functional limitations Baseline: 5/10          PLAN: PT FREQUENCY: 2x/week   PT DURATION: 6 weeks   PLANNED INTERVENTIONS: Therapeutic exercises, Therapeutic activity, Neuro Muscular re-education, Balance training, Gait training, Patient/Family education, Joint mobilization, Dry Needling, Spinal mobilization, and Manual therapy   PLAN FOR NEXT SESSION: Review HEP and progress PRN, dry needling / manual for right lat/subscap/pec/upper trap regions, stretching and postural strengthening   Hilda Blades, PT, DPT, LAT, ATC 03/24/21  5:52 PM Phone: [phone] Fax: [phone]

## 2021-03-24 ENCOUNTER — Encounter: Payer: Self-pay | Admitting: Physical Therapy

## 2021-03-24 ENCOUNTER — Other Ambulatory Visit: Payer: Self-pay

## 2021-03-24 ENCOUNTER — Ambulatory Visit: Payer: PPO | Admitting: Physical Therapy

## 2021-03-24 DIAGNOSIS — M25511 Pain in right shoulder: Secondary | ICD-10-CM | POA: Diagnosis not present

## 2021-03-24 DIAGNOSIS — M6281 Muscle weakness (generalized): Secondary | ICD-10-CM

## 2021-03-24 DIAGNOSIS — G8929 Other chronic pain: Secondary | ICD-10-CM

## 2021-03-27 ENCOUNTER — Ambulatory Visit: Payer: PPO | Admitting: Physical Therapy

## 2021-03-27 NOTE — Therapy (Incomplete)
OUTPATIENT PHYSICAL THERAPY TREATMENT NOTE   Patient Name: [name] MRN: [id number] DOB:[birth year], 66 y.o., female Today's Date: 03/27/2021  PCP: Azzie Glatter, FNP REFERRING PROVIDER: Lind Covert, *      Past Medical History:  Diagnosis Date   Arthritis    Backache, unspecified    Cancer ([place])    breast s/p BL mastectomies   Colon polyps    Hyperlipidemia    PONV (postoperative nausea and vomiting)    Past Surgical History:  Procedure Laterality Date   BREAST RECONSTRUCTION WITH PLACEMENT OF TISSUE EXPANDER AND FLEX HD (ACELLULAR HYDRATED DERMIS) Bilateral 04/05/2019   Procedure: BILATERAL BREAST RECONSTRUCTION WITH PLACEMENT OF TISSUE EXPANDER AND FLEX HD (ACELLULAR HYDRATED DERMIS);  Surgeon: Wallace Going, DO;  Location: [facility];  Service: Plastics;  Laterality: Bilateral;   ENDOMETRIAL ABLATION  1998   KNEE SURGERY Right 2007   LIPOSUCTION WITH LIPOFILLING Bilateral 01/30/2020   Procedure: LIPOSUCTION WITH LIPOFILLING;  Surgeon: Wallace Going, DO;  Location: [facility];  Service: Plastics;  Laterality: Bilateral;  90 min, please   MASTECTOMY W/ SENTINEL NODE BIOPSY Bilateral 04/05/2019   Procedure: BILATERAL MASTECTOMIES WITH RIGHT SENTINEL LYMPH NODE BIOPSY;  Surgeon: [name], MD;  Location: [facility];  Service: General;  Laterality: Bilateral;   REMOVAL OF BILATERAL TISSUE EXPANDERS WITH PLACEMENT OF BILATERAL BREAST IMPLANTS Bilateral 09/26/2019   Procedure: REMOVAL OF BILATERAL TISSUE EXPANDERS WITH PLACEMENT OF BILATERAL BREAST IMPLANTS;  Surgeon: Wallace Going, DO;  Location: [facility];  Service: Plastics;  Laterality: Bilateral;   [place]   Patient Active Problem List   Diagnosis Date Noted   S/P breast reconstruction, bilateral 09/30/2019   S/P mastectomy, bilateral 04/13/2019   Ductal carcinoma in situ (DCIS) of  right breast 01/12/2019   Weight gain 09/09/2013   Encounter for counseling 11/25/2012   Screening for malignant neoplasm of the cervix 11/25/2012   Menopause 10/08/2012   Unspecified vitamin D deficiency 10/08/2012   Hyperlipemia 10/08/2012   Osteoarthritis 06/04/2012    REFERRING PROVIDER: Wallace Going, DO   REFERRING DIAG: S/P breast reconstruction, bilateral   THERAPY DIAG:  Chronic right shoulder pain   Muscle weakness (generalized)   ONSET DATE: 04/05/2019  PERTINENT HISTORY: Bilateral mastectomies with implant-based reconstruction, lymph node removal on right  PRECAUTIONS: None  WEIGHT BEARING RESTRICTIONS No    SUBJECTIVE: Patient reports she feels she has improved a lot. She has been a little sore today but overall improved. The exercises are continuing to do well.   PAIN:  Are you having pain? Yes VAS scale: 3-4/10 Pain location: Right axilla, breast region Pain orientation: Right  PAIN TYPE: Chronic Pain description: tight, wrenching  Aggravating factors: unknown Relieving factors: exercises, soft tissue work   PATIENT GOALS: Pain relief    OBJECTIVE: (BOLDED MEASURES ASSESSED THIS VISIT) SURVEYS:  FOTO 59 % functional status   POSTURE: Rounded shoulder posture, right slightly more elevated compared to left   PALPATION: Tender right lat, subscap, pec, upper trap   UPPER EXTREMITY AROM:   Patient reported increased pulling in axillary and chest region with all right shoulder AROM AROM Right 03/13/2021 Left 03/13/2021  03/19/21  Shoulder flexion 140 160 Rt 150  Shoulder extension 60 60   Shoulder abduction 145 170 Rt 155  Shoulder internal rotation T12 T8   Shoulder external rotation T1 T2     Right shoulder PROM  grossly WFL, slight limitation with flexion compared to left side, increased stretch when shoulder externally rotated and then flexed overhead   UPPER EXTREMITY MMT:   MMT Right 03/13/2021 Left 03/13/2021  Shoulder flexion 4+  5  Shoulder extension 4+ 5  Shoulder abduction 4+ 5  Shoulder ER 4+ 5  Shoulder IR 4+ 5  Periscap musculature 4 4     TODAY'S TREATMENT:  03/27/2021:    03/24/2021: Therapeutic Exercise: UBE L1 x 4 min (2 fwd/bwd) while taking subjective Wall slide flexion with forward lean for shoulder stretch 5 x 5 sec hold Doorway pec stretch at 90 deg 3 x 30 sec Thoracic extension over FR Sidelying thoracic rotation x 5 each Manual Therapy: STM and pinch/stretch MFR for lat, subscap, pec major GHJ mobs in inferior/posterior directions at various ranges of elevation Shoulder PROM all directions  03/19/21: Therapeutic Exercise: Ardine Eng pose 60 sec  Seated thoracic extension 1 x 10  Resisted ER and IR LUE 1 x 10 green band  Bilateral ER red band 2 x 15 Resisted horizontal abduction 2 x 10 red band  Updated HEP Manual Therapy: STM/DTM to anterior chest musculature Latissimus dorsi ATR    PATIENT EDUCATION: Education details: HEP Person educated: Patient Education method: Explanation Education comprehension: verbalized understanding   HOME EXERCISE PROGRAM: Access Code: [email or web address]     ASSESSMENT: CLINICAL IMPRESSION: Patient tolerated therapy well with no adverse effects. *** Patient would benefit from continued skilled PT to progress mobility and strength in order to reduce pain and maximize functional ability.  Therapy focused primarily on improve shoulder and thoracic mobility and active motion. She continues to report improvement since initial eval but she did note mild pinching sensation with no apparent mechanism. She tolerated manual and stretching well, did not perform any resisted exercises this visit. No changes to HEP this visit.      GOALS: Goals reviewed with patient? Yes   SHORT TERM GOALS:   STG Name Target Date Goal status  1 Patient will be I with initial HEP in order to progress with therapy. Baseline: provided at eval 04/03/2021 ONGOING  2 PT will review FOTO  with patient by 3rd visit in order to understand expected progress and outcome with therapy. Baseline:  assessed at eval 04/03/2021 ACHIEVED  3 Patient will report resting pain level </= 3/10 in order to reduce function limitations Baseline: 5/10 04/03/2021 INITIAL    LONG TERM GOALS:    LTG Name Target Date Goal status  1 Patient will be I with final HEP to maintain progress from PT. Baseline: provided at eval 04/24/2021 INITIAL  2 Patient will report >/= 67% status on FOTO to indicate improved functional ability. Baseline: 59% functional status 04/24/2021 INITIAL  3 Patient will improve shoulder AROM to 160 deg in order to allow for no pulling in the axilla when reaching into a cupboard Baseline: 140 deg 04/24/2021 INITIAL  4 Patient will demonstrate shoulder strength to 5/5 MMT in order to improve lifting ability Baseline: 4+/5 MMT and 4/5 MMT periscapular musculature 04/24/2021 INITIAL  5 Patient will report pain level </= 1/10 at rest to reduce functional limitations Baseline: 5/10          PLAN: PT FREQUENCY: 2x/week   PT DURATION: 6 weeks   PLANNED INTERVENTIONS: Therapeutic exercises, Therapeutic activity, Neuro Muscular re-education, Balance training, Gait training, Patient/Family education, Joint mobilization, Dry Needling, Spinal mobilization, and Manual therapy   PLAN FOR NEXT SESSION: Review HEP and progress PRN, dry needling / manual for  right lat/subscap/pec/upper trap regions, stretching and postural strengthening   Hilda Blades, PT, DPT, LAT, ATC 03/27/21  8:13 AM Phone: [phone] Fax: [phone]

## 2021-04-01 ENCOUNTER — Encounter: Payer: Self-pay | Admitting: Physical Therapy

## 2021-04-01 ENCOUNTER — Ambulatory Visit: Payer: PPO | Attending: Plastic Surgery | Admitting: Physical Therapy

## 2021-04-01 ENCOUNTER — Other Ambulatory Visit: Payer: Self-pay

## 2021-04-01 DIAGNOSIS — G8929 Other chronic pain: Secondary | ICD-10-CM | POA: Diagnosis present

## 2021-04-01 DIAGNOSIS — M6281 Muscle weakness (generalized): Secondary | ICD-10-CM | POA: Insufficient documentation

## 2021-04-01 DIAGNOSIS — M25511 Pain in right shoulder: Secondary | ICD-10-CM | POA: Insufficient documentation

## 2021-04-01 NOTE — Patient Instructions (Signed)
Access Code: [email or web address] URL: [email or web address] Date: 04/01/2021 Prepared by: Hilda Blades  Exercises Sidelying Thoracic Rotation - 1-2 x daily - 7 x weekly - 2 sets - 10 reps Child's Pose Stretch - 1-2 x daily - 7 x weekly - 3 sets - 30 sec hold Seated Thoracic Lumbar Extension - 1-2 x daily - 7 x weekly - 2 sets - 10 reps Corner Pec Major Stretch - 1-2 x daily - 7 x weekly - 3 reps - 20-30 seconds hold Standing Glute Med Mobilization with Small Ball on Wall - 2 x daily - 2-3 minutes hold Chest Mobilization with Small Ball - 2 x daily - 2-3 minutes hold Standing Bilateral Low Shoulder Row with Anchored Resistance - 1 x daily - 7 x weekly - 2 sets - 10 reps Shoulder External Rotation with Anchored Resistance - 1 x daily - 7 x weekly - 2 sets - 10 reps Shoulder Internal Rotation with Resistance - 1 x daily - 7 x weekly - 2 sets - 10 reps Shoulder External Rotation and Scapular Retraction with Resistance - 1 x daily - 7 x weekly - 2 sets - 10 reps Supine Shoulder Horizontal Abduction with Resistance - 1 x daily - 7 x weekly - 2 sets - 10 reps

## 2021-04-01 NOTE — Therapy (Signed)
OUTPATIENT PHYSICAL THERAPY TREATMENT NOTE   Patient Name: [name] MRN: [id number] DOB:08-Sep-1955, 66 y.o., female Today's Date: 04/01/2021  PCP: Azzie Glatter, FNP REFERRING PROVIDER: Wallace Going, DO   PT End of Session - 04/01/21 1708     Visit Number 4    Number of Visits 12    Date for PT Re-Evaluation 04/24/21    Authorization Type [employer]    PT Start Time 1700    PT Stop Time 1740    PT Time Calculation (min) 40 min    Activity Tolerance Patient tolerated treatment well    Behavior During Therapy WFL for tasks assessed/performed               Past Medical History:  Diagnosis Date   Arthritis    Backache, unspecified    Cancer ([place])    breast s/p BL mastectomies   Colon polyps    Hyperlipidemia    PONV (postoperative nausea and vomiting)    Past Surgical History:  Procedure Laterality Date   BREAST RECONSTRUCTION WITH PLACEMENT OF TISSUE EXPANDER AND FLEX HD (ACELLULAR HYDRATED DERMIS) Bilateral 04/05/2019   Procedure: BILATERAL BREAST RECONSTRUCTION WITH PLACEMENT OF TISSUE EXPANDER AND FLEX HD (ACELLULAR HYDRATED DERMIS);  Surgeon: Wallace Going, DO;  Location: [place];  Service: Plastics;  Laterality: Bilateral;   ENDOMETRIAL ABLATION  1998   KNEE SURGERY Right 2007   LIPOSUCTION WITH LIPOFILLING Bilateral 01/30/2020   Procedure: LIPOSUCTION WITH LIPOFILLING;  Surgeon: Wallace Going, DO;  Location: [place];  Service: Plastics;  Laterality: Bilateral;  90 min, please   MASTECTOMY W/ SENTINEL NODE BIOPSY Bilateral 04/05/2019   Procedure: BILATERAL MASTECTOMIES WITH RIGHT SENTINEL LYMPH NODE BIOPSY;  Surgeon: [name], MD;  Location: [place];  Service: General;  Laterality: Bilateral;   REMOVAL OF BILATERAL TISSUE EXPANDERS WITH PLACEMENT OF BILATERAL BREAST IMPLANTS Bilateral 09/26/2019   Procedure: REMOVAL OF BILATERAL TISSUE EXPANDERS WITH PLACEMENT OF  BILATERAL BREAST IMPLANTS;  Surgeon: Wallace Going, DO;  Location: [place];  Service: Plastics;  Laterality: Bilateral;   [place]   Patient Active Problem List   Diagnosis Date Noted   S/P breast reconstruction, bilateral 09/30/2019   S/P mastectomy, bilateral 04/13/2019   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   Weight gain 09/09/2013   Encounter for counseling 11/25/2012   Screening for malignant neoplasm of the cervix 11/25/2012   Menopause 10/08/2012   Unspecified vitamin D deficiency 10/08/2012   Hyperlipemia 10/08/2012   Osteoarthritis 06/04/2012    REFERRING PROVIDER: Wallace Going, DO   REFERRING DIAG: S/P breast reconstruction, bilateral   THERAPY DIAG:  Chronic right shoulder pain   Muscle weakness (generalized)   ONSET DATE: 04/05/2019  PERTINENT HISTORY: Bilateral mastectomies with implant-based reconstruction, lymph node removal on right  PRECAUTIONS: None  WEIGHT BEARING RESTRICTIONS No    SUBJECTIVE: Patient reports she continues to improve, denies any pain this visit.  PAIN:  Are you having pain? No VAS scale: 010 Pain location: Right axilla, breast region Pain orientation: Right  PAIN TYPE: Chronic Pain description: tight, wrenching  Aggravating factors: unknown Relieving factors: exercises, soft tissue work   PATIENT GOALS: Pain relief    OBJECTIVE: (BOLDED MEASURES ASSESSED THIS VISIT) SURVEYS:  FOTO 59 % functional status   POSTURE: Rounded shoulder posture, right slightly more elevated compared to left   PALPATION: Tender right  lat, subscap, pec, upper trap   UPPER EXTREMITY AROM:   Patient reported increased pulling in axillary and chest region with all right shoulder AROM AROM Right 03/13/2021 Left 03/13/2021  03/19/21  Shoulder flexion 140 160 Rt 150  Shoulder extension 60 60   Shoulder abduction 145 170 Rt 155  Shoulder internal rotation T12 T8    Shoulder external rotation T1 T2     Right shoulder PROM grossly WFL, slight limitation with flexion compared to left side, increased stretch when shoulder externally rotated and then flexed overhead   UPPER EXTREMITY MMT:   MMT Right 03/13/2021 Left 03/13/2021  Shoulder flexion 4+ 5  Shoulder extension 4+ 5  Shoulder abduction 4+ 5  Shoulder ER 4+ 5  Shoulder IR 4+ 5  Periscap musculature 4 4     TODAY'S TREATMENT:  04/01/2021: There-ex: UBE L1 x 4 min (2 fwd/bwd) while taking subjective Corner chest stretch 3 x 20 sec Thoracic extension / lat step-back stretch at counter 10 x 5 sec Thoracic extension over FR at various levels Sidelying thoracic rotation x 5 each Supine shoulder horizontal abduction with green 2 x 10 Supine serratus press with 8# 2 x 10 Supine diagonals with green x 10 each Seated double ER + scap retraction with green 2 x 10 Reviewed standing ER and IR with green x 10 each   03/24/2021: Therapeutic Exercise: UBE L1 x 4 min (2 fwd/bwd) while taking subjective Wall slide flexion with forward lean for shoulder stretch 5 x 5 sec hold Doorway pec stretch at 90 deg 3 x 30 sec Thoracic extension over FR Sidelying thoracic rotation x 5 each Manual Therapy: STM and pinch/stretch MFR for lat, subscap, pec major GHJ mobs in inferior/posterior directions at various ranges of elevation Shoulder PROM all directions  03/19/21: Therapeutic Exercise: Ardine Eng pose 60 sec  Seated thoracic extension 1 x 10  Resisted ER and IR LUE 1 x 10 green band  Bilateral ER red band 2 x 15 Resisted horizontal abduction 2 x 10 red band  Updated HEP Manual Therapy: STM/DTM to anterior chest musculature Latissimus dorsi ATR    PATIENT EDUCATION: Education details: HEP Person educated: Patient Education method: Explanation Education comprehension: verbalized understanding   HOME EXERCISE PROGRAM: Access Code: [email or web address]     ASSESSMENT: CLINICAL IMPRESSION: Patient  tolerated therapy well with no adverse effects. Patient reports continued improvement and no pain this visit so therapy focused on there-ex to progress periscapular and rotator cuff strength to improve postural control. She is progressing well her exercises and is tolerating progressions in resistance of exercises. Updated HEP this visit to further progress strengthening at home. Patient would benefit from continued skilled PT to progress mobility and strength in order to reduce pain and maximize functional ability.     GOALS: Goals reviewed with patient? Yes   SHORT TERM GOALS:   STG Name Target Date Goal status  1 Patient will be I with initial HEP in order to progress with therapy. Baseline: patient independent with HEP 04/03/2021 ACHIEVED  2 PT will review FOTO with patient by 3rd visit in order to understand expected progress and outcome with therapy. Baseline:  assessed at eval 04/03/2021 ACHIEVED  3 Patient will report resting pain level </= 3/10 in order to reduce function limitations Baseline: patient denies pain this visit 04/03/2021 ONGOING    LONG TERM GOALS:    LTG Name Target Date Goal status  1 Patient will be I with final HEP to maintain progress from PT. Baseline:  provided at eval 04/24/2021 INITIAL  2 Patient will report >/= 67% status on FOTO to indicate improved functional ability. Baseline: 59% functional status 04/24/2021 INITIAL  3 Patient will improve shoulder AROM to 160 deg in order to allow for no pulling in the axilla when reaching into a cupboard Baseline: 140 deg 04/24/2021 INITIAL  4 Patient will demonstrate shoulder strength to 5/5 MMT in order to improve lifting ability Baseline: 4+/5 MMT and 4/5 MMT periscapular musculature 04/24/2021 INITIAL  5 Patient will report pain level </= 1/10 at rest to reduce functional limitations Baseline: 5/10          PLAN: PT FREQUENCY: 2x/week   PT DURATION: 6 weeks   PLANNED INTERVENTIONS: Therapeutic exercises, Therapeutic  activity, Neuro Muscular re-education, Balance training, Gait training, Patient/Family education, Joint mobilization, Dry Needling, Spinal mobilization, and Manual therapy   PLAN FOR NEXT SESSION: Review HEP and progress PRN, dry needling / manual for right lat/subscap/pec/upper trap regions, stretching and postural strengthening   Hilda Blades, PT, DPT, LAT, ATC 04/01/21  5:45 PM Phone: [phone] Fax: [phone]

## 2021-04-02 NOTE — Therapy (Signed)
OUTPATIENT PHYSICAL THERAPY TREATMENT NOTE   Patient Name: [name] MRN: [id number] DOB:[birth year], 66 y.o., female Today's Date: 04/03/2021  PCP: Azzie Glatter, FNP REFERRING PROVIDER: Wallace Going, DO   PT End of Session - 04/03/21 1136     Visit Number 5    Number of Visits 12    Date for PT Re-Evaluation 04/24/21    Authorization Type [employer]    PT Start Time 1134    PT Stop Time 1215    PT Time Calculation (min) 41 min    Activity Tolerance Patient tolerated treatment well    Behavior During Therapy WFL for tasks assessed/performed                Past Medical History:  Diagnosis Date   Arthritis    Backache, unspecified    Cancer ([place])    breast s/p BL mastectomies   Colon polyps    Hyperlipidemia    PONV (postoperative nausea and vomiting)    Past Surgical History:  Procedure Laterality Date   BREAST RECONSTRUCTION WITH PLACEMENT OF TISSUE EXPANDER AND FLEX HD (ACELLULAR HYDRATED DERMIS) Bilateral 04/05/2019   Procedure: BILATERAL BREAST RECONSTRUCTION WITH PLACEMENT OF TISSUE EXPANDER AND FLEX HD (ACELLULAR HYDRATED DERMIS);  Surgeon: Wallace Going, DO;  Location: [place];  Service: Plastics;  Laterality: Bilateral;   ENDOMETRIAL ABLATION  1998   KNEE SURGERY Right 2007   LIPOSUCTION WITH LIPOFILLING Bilateral 01/30/2020   Procedure: LIPOSUCTION WITH LIPOFILLING;  Surgeon: Wallace Going, DO;  Location: [place];  Service: Plastics;  Laterality: Bilateral;  90 min, please   MASTECTOMY W/ SENTINEL NODE BIOPSY Bilateral 04/05/2019   Procedure: BILATERAL MASTECTOMIES WITH RIGHT SENTINEL LYMPH NODE BIOPSY;  Surgeon: [name], MD;  Location: [place];  Service: General;  Laterality: Bilateral;   REMOVAL OF BILATERAL TISSUE EXPANDERS WITH PLACEMENT OF BILATERAL BREAST IMPLANTS Bilateral 09/26/2019   Procedure: REMOVAL OF BILATERAL TISSUE EXPANDERS WITH PLACEMENT  OF BILATERAL BREAST IMPLANTS;  Surgeon: Wallace Going, DO;  Location: [place];  Service: Plastics;  Laterality: Bilateral;   [place]   Patient Active Problem List   Diagnosis Date Noted   S/P breast reconstruction, bilateral 09/30/2019   S/P mastectomy, bilateral 04/13/2019   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   Weight gain 09/09/2013   Encounter for counseling 11/25/2012   Screening for malignant neoplasm of the cervix 11/25/2012   Menopause 10/08/2012   Unspecified vitamin D deficiency 10/08/2012   Hyperlipemia 10/08/2012   Osteoarthritis 06/04/2012    REFERRING PROVIDER: Wallace Going, DO   REFERRING DIAG: S/P breast reconstruction, bilateral   THERAPY DIAG:  Chronic right shoulder pain   Muscle weakness (generalized)   ONSET DATE: 04/05/2019  PERTINENT HISTORY: Bilateral mastectomies with implant-based reconstruction, lymph node removal on right  PRECAUTIONS: None  WEIGHT BEARING RESTRICTIONS No    SUBJECTIVE: Patient reports she continues to improve, denies any pain this visit.   PAIN:  Are you having pain? No VAS scale: 010 Pain location: Right axilla, breast region Pain orientation: Right  PAIN TYPE: Chronic Pain description: tight, wrenching  Aggravating factors: unknown Relieving factors: exercises, soft tissue work   PATIENT GOALS: Pain relief    OBJECTIVE: (BOLDED MEASURES ASSESSED THIS VISIT) SURVEYS:  FOTO 59 % functional status   POSTURE: Rounded shoulder posture, right slightly more elevated compared to left   PALPATION:  Tender right lat, subscap, pec, upper trap   UPPER EXTREMITY AROM:   Patient reported increased pulling in axillary and chest region with all right shoulder AROM AROM Right 03/13/2021 Left 03/13/2021  03/19/21  04/03/2021:  Shoulder flexion 140 160 Rt 150 Right 150  Shoulder extension 60 60    Shoulder abduction 145 170 Rt 155    Shoulder internal rotation T12 T8  Right T10  Shoulder external rotation T1 T2      Right shoulder PROM grossly WFL, slight limitation with flexion compared to left side, increased stretch when shoulder externally rotated and then flexed overhead   UPPER EXTREMITY MMT:   MMT Right 03/13/2021 Left 03/13/2021  Shoulder flexion 4+ 5  Shoulder extension 4+ 5  Shoulder abduction 4+ 5  Shoulder ER 4+ 5  Shoulder IR 4+ 5  Periscap musculature 4 4     TODAY'S TREATMENT:  04/03/2021: There-ex: Sidelying thoracic rotation x 10 each Child's pose stretch 2 x 20 sec Cat cow x 10 Row with green x 15 Extension + scap retraction with green  x 15 Banded shoulder ER / IR x 15 each High Row (lat) with green x 15 Serratus press from elevated table x 10 Bird dog standing from elevated table x 20 Manual: Right GHJ mobs in inferior/posterior directions at various ranges of elevation Right shoulder PROM all directions Right scap pinned stretching for lat and posterior cuff   04/01/2021: There-ex: UBE L1 x 4 min (2 fwd/bwd) while taking subjective Corner chest stretch 3 x 20 sec Thoracic extension / lat step-back stretch at counter 10 x 5 sec Thoracic extension over FR at various levels Sidelying thoracic rotation x 5 each Supine shoulder horizontal abduction with green 2 x 10 Supine serratus press with 8# 2 x 10 Supine diagonals with green x 10 each Seated double ER + scap retraction with green 2 x 10 Reviewed standing ER and IR with green x 10 each  03/24/2021: Therapeutic Exercise: UBE L1 x 4 min (2 fwd/bwd) while taking subjective Wall slide flexion with forward lean for shoulder stretch 5 x 5 sec hold Doorway pec stretch at 90 deg 3 x 30 sec Thoracic extension over FR Sidelying thoracic rotation x 5 each Manual Therapy: STM and pinch/stretch MFR for lat, subscap, pec major GHJ mobs in inferior/posterior directions at various ranges of elevation Shoulder PROM all  directions  03/19/21: Therapeutic Exercise: Ardine Eng pose 60 sec  Seated thoracic extension 1 x 10  Resisted ER and IR LUE 1 x 10 green band  Bilateral ER red band 2 x 15 Resisted horizontal abduction 2 x 10 red band  Updated HEP Manual Therapy: STM/DTM to anterior chest musculature Latissimus dorsi ATR    PATIENT EDUCATION: Education details: HEP Person educated: Patient Education method: Explanation Education comprehension: Verbalized understanding   HOME EXERCISE PROGRAM: Access Code: [email or web address]     ASSESSMENT: CLINICAL IMPRESSION: Patient tolerated therapy well with no adverse effects. Patient continues to report improvement with her shoulder symptoms and denied any pain this visit. Therapy focused on progression of shoulder mobility and muscular tightness, and progression of shoulder and postural strengthening. Patient seems to be progressing very well with her exercises and only requires occasional cueing for proper exercise technique. No changes made to HEP this visit. Patient would benefit from continued skilled PT to progress mobility and strength in order to reduce pain and maximize functional ability.     GOALS: Goals reviewed with patient? Yes   SHORT TERM GOALS:  STG Name Target Date Goal status  1 Patient will be I with initial HEP in order to progress with therapy. Baseline: patient independent with HEP 04/03/2021 ACHIEVED  2 PT will review FOTO with patient by 3rd visit in order to understand expected progress and outcome with therapy. Baseline:  assessed at eval 04/03/2021 ACHIEVED  3 Patient will report resting pain level </= 3/10 in order to reduce function limitations Baseline: patient denies pain this visit 04/03/2021 ONGOING    LONG TERM GOALS:    LTG Name Target Date Goal status  1 Patient will be I with final HEP to maintain progress from PT. Baseline: provided at eval 04/24/2021 INITIAL  2 Patient will report >/= 67% status on FOTO to indicate improved  functional ability. Baseline: 59% functional status 04/24/2021 INITIAL  3 Patient will improve shoulder AROM to 160 deg in order to allow for no pulling in the axilla when reaching into a cupboard Baseline: 140 deg 04/24/2021 INITIAL  4 Patient will demonstrate shoulder strength to 5/5 MMT in order to improve lifting ability Baseline: 4+/5 MMT and 4/5 MMT periscapular musculature 04/24/2021 INITIAL  5 Patient will report pain level </= 1/10 at rest to reduce functional limitations Baseline: 5/10          PLAN: PT FREQUENCY: 2x/week   PT DURATION: 6 weeks   PLANNED INTERVENTIONS: Therapeutic exercises, Therapeutic activity, Neuro Muscular re-education, Balance training, Gait training, Patient/Family education, Joint mobilization, Dry Needling, Spinal mobilization, and Manual therapy   PLAN FOR NEXT SESSION: Review HEP and progress PRN, reassess FOTO, dry needling / manual for right lat/subscap/pec/upper trap regions, stretching and postural strengthening   Hilda Blades, PT, DPT, LAT, ATC 04/03/21  12:18 PM Phone: [phone] Fax: [phone]

## 2021-04-03 ENCOUNTER — Other Ambulatory Visit: Payer: Self-pay

## 2021-04-03 ENCOUNTER — Ambulatory Visit: Payer: PPO | Admitting: Physical Therapy

## 2021-04-03 ENCOUNTER — Encounter: Payer: Self-pay | Admitting: Physical Therapy

## 2021-04-03 DIAGNOSIS — M25511 Pain in right shoulder: Secondary | ICD-10-CM | POA: Diagnosis not present

## 2021-04-03 DIAGNOSIS — G8929 Other chronic pain: Secondary | ICD-10-CM

## 2021-04-03 DIAGNOSIS — M6281 Muscle weakness (generalized): Secondary | ICD-10-CM

## 2021-04-07 NOTE — Therapy (Signed)
OUTPATIENT PHYSICAL THERAPY TREATMENT NOTE   Patient Name: [name] MRN: [id number] DOB:[birth year], 66 y.o., female Today's Date: 04/08/2021  PCP: Azzie Glatter, FNP REFERRING PROVIDER: Wallace Going, DO   PT End of Session - 04/08/21 1615     Visit Number 6    Number of Visits 12    Date for PT Re-Evaluation 04/24/21    Authorization Type [employer]    PT Start Time 1615    PT Stop Time 1700   4 minutes TPDN   PT Time Calculation (min) 45 min    Activity Tolerance Patient tolerated treatment well    Behavior During Therapy WFL for tasks assessed/performed                 Past Medical History:  Diagnosis Date   Arthritis    Backache, unspecified    Cancer ([place])    breast s/p BL mastectomies   Colon polyps    Hyperlipidemia    PONV (postoperative nausea and vomiting)    Past Surgical History:  Procedure Laterality Date   BREAST RECONSTRUCTION WITH PLACEMENT OF TISSUE EXPANDER AND FLEX HD (ACELLULAR HYDRATED DERMIS) Bilateral 04/05/2019   Procedure: BILATERAL BREAST RECONSTRUCTION WITH PLACEMENT OF TISSUE EXPANDER AND FLEX HD (ACELLULAR HYDRATED DERMIS);  Surgeon: Wallace Going, DO;  Location: [facility];  Service: Plastics;  Laterality: Bilateral;   ENDOMETRIAL ABLATION  1998   KNEE SURGERY Right 2007   LIPOSUCTION WITH LIPOFILLING Bilateral 01/30/2020   Procedure: LIPOSUCTION WITH LIPOFILLING;  Surgeon: Wallace Going, DO;  Location: [facility];  Service: Plastics;  Laterality: Bilateral;  90 min, please   MASTECTOMY W/ SENTINEL NODE BIOPSY Bilateral 04/05/2019   Procedure: BILATERAL MASTECTOMIES WITH RIGHT SENTINEL LYMPH NODE BIOPSY;  Surgeon: [name], MD;  Location: [facility];  Service: General;  Laterality: Bilateral;   REMOVAL OF BILATERAL TISSUE EXPANDERS WITH PLACEMENT OF BILATERAL BREAST IMPLANTS Bilateral 09/26/2019   Procedure: REMOVAL OF BILATERAL TISSUE  EXPANDERS WITH PLACEMENT OF BILATERAL BREAST IMPLANTS;  Surgeon: Wallace Going, DO;  Location: [facility];  Service: Plastics;  Laterality: Bilateral;   [place]   Patient Active Problem List   Diagnosis Date Noted   S/P breast reconstruction, bilateral 09/30/2019   S/P mastectomy, bilateral 04/13/2019   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   Weight gain 09/09/2013   Encounter for counseling 11/25/2012   Screening for malignant neoplasm of the cervix 11/25/2012   Menopause 10/08/2012   Unspecified vitamin D deficiency 10/08/2012   Hyperlipemia 10/08/2012   Osteoarthritis 06/04/2012    REFERRING PROVIDER: Wallace Going, DO   REFERRING DIAG: S/P breast reconstruction, bilateral   THERAPY DIAG:  Chronic right shoulder pain   Muscle weakness (generalized)   ONSET DATE: 04/05/2019  PERTINENT HISTORY: Bilateral mastectomies with implant-based reconstruction, lymph node removal on right  PRECAUTIONS: None  WEIGHT BEARING RESTRICTIONS No    SUBJECTIVE:  "I wouldn't call it pain. It just feels like something is irritated here." (Points to anterolateral breast/axilla)  PAIN:  Are you having pain? No VAS scale: 010 Pain location: Right axilla, breast region Pain orientation: Right  PAIN TYPE: Chronic Pain description: tight, wrenching  Aggravating factors: unknown Relieving factors: exercises, soft tissue work   PATIENT GOALS: Pain relief    OBJECTIVE:  *Unless otherwise noted all objective measures were captured on initial evaluation.   SURVEYS:  FOTO  59 % functional status 04/08/21: 67%   POSTURE: Rounded shoulder posture, right slightly more elevated compared to left   PALPATION: Tender right lat, subscap, pec, upper trap   UPPER EXTREMITY AROM:   Patient reported increased pulling in axillary and chest region with all right shoulder AROM AROM Right 03/13/2021 Left 03/13/2021   03/19/21  04/03/2021:  Shoulder flexion 140 160 Rt 150 Right 150  Shoulder extension 60 60    Shoulder abduction 145 170 Rt 155   Shoulder internal rotation T12 T8  Right T10  Shoulder external rotation T1 T2      Right shoulder PROM grossly WFL, slight limitation with flexion compared to left side, increased stretch when shoulder externally rotated and then flexed overhead   UPPER EXTREMITY MMT:   MMT Right 03/13/2021 Left 03/13/2021  Shoulder flexion 4+ 5  Shoulder extension 4+ 5  Shoulder abduction 4+ 5  Shoulder ER 4+ 5  Shoulder IR 4+ 5  Periscap musculature 4 4     TODAY'S TREATMENT:  [facility] Adult PT Treatment:                                                DATE: 04/08/21 Therapeutic Exercise: UBE 2.5 min each fwd/bwd level 2.5  Pec stretch on roller 2 minutes Resisted shoulder abduction and diagonals yellow band 2 x 10  Verbal update to HEP to include resisted diagonals  Manual Therapy: STM/DTM/Trigger point release Rt latissimus dorsi, subscapularis, pectorals  Passive stretching of Rt lat and pectorals  Trigger Point Dry Needling Treatment: Pre-treatment instruction: Patient instructed on dry needling rationale, procedures, and possible side effects including pain during treatment (achy,cramping feeling), bruising, drop of blood, lightheadedness, nausea, sweating. Patient Consent Given: Yes Education handout provided: Previously provided Muscles treated: Rt pectoralis major/minor    Treatment response/outcome: Palpable decrease in muscle tension Post-treatment instructions: Patient instructed to expect possible mild to moderate muscle soreness later today and/or tomorrow. Patient instructed in methods to reduce muscle soreness and to continue prescribed HEP. If patient was dry needled over the lung field, patient was instructed on signs and symptoms of pneumothorax and, however unlikely, to see immediate medical attention should they occur. Patient was also educated on signs  and symptoms of infection and to seek medical attention should they occur. Patient verbalized understanding of these instructions and education.  Self-Care:  discussed and answered patient's questions regarding [place] [employer] for continued fitness training following PT discharge.   04/03/2021: There-ex: Sidelying thoracic rotation x 10 each Child's pose stretch 2 x 20 sec Cat cow x 10 Row with green x 15 Extension + scap retraction with green  x 15 Banded shoulder ER / IR x 15 each High Row (lat) with green x 15 Serratus press from elevated table x 10 Bird dog standing from elevated table x 20 Manual: Right GHJ mobs in inferior/posterior directions at various ranges of elevation Right shoulder PROM all directions Right scap pinned stretching for lat and posterior cuff   04/01/2021: There-ex: UBE L1 x 4 min (2 fwd/bwd) while taking subjective Corner chest stretch 3 x 20 sec Thoracic extension / lat step-back stretch at counter 10 x 5 sec Thoracic extension over FR at various levels Sidelying thoracic rotation x 5 each Supine shoulder horizontal abduction with green 2 x 10 Supine serratus press with 8# 2 x 10 Supine diagonals  with green x 10 each Seated double ER + scap retraction with green 2 x 10 Reviewed standing ER and IR with green x 10 each  03/24/2021: Therapeutic Exercise: UBE L1 x 4 min (2 fwd/bwd) while taking subjective Wall slide flexion with forward lean for shoulder stretch 5 x 5 sec hold Doorway pec stretch at 90 deg 3 x 30 sec Thoracic extension over FR Sidelying thoracic rotation x 5 each Manual Therapy: STM and pinch/stretch MFR for lat, subscap, pec major GHJ mobs in inferior/posterior directions at various ranges of elevation Shoulder PROM all directions  03/19/21: Therapeutic Exercise: Ardine Eng pose 60 sec  Seated thoracic extension 1 x 10  Resisted ER and IR LUE 1 x 10 green band  Bilateral ER red band 2 x 15 Resisted horizontal abduction 2  x 10 red band  Updated HEP Manual Therapy: STM/DTM to anterior chest musculature Latissimus dorsi ATR    PATIENT EDUCATION: Education details: see treatment  Person educated: Patient Education method: Explanation, demo, handout, verbal cues  Education comprehension: Verbalized understanding,returned demo, verbal cues.    HOME EXERCISE PROGRAM: Access Code: [email or web address]     ASSESSMENT: CLINICAL IMPRESSION:  Patient tolerated session well today focusing on lingering muscle restriction about the Rt chest musculature. She is noted to have tautness and palpable tenderness about pec major and minor. TPDN was performed today with improvement noted in muscle tautness and tenderness following this intervention. Latissimus dorsi and subscapularis tautness has much reduced compared to previous sessions with mild restriction noted in the lat today. Her FOTO score has much improved compared to baseline having met this long term functional goal. She reported a reduction in "irritation" at the end of the session.    GOALS: Goals reviewed with patient? Yes   SHORT TERM GOALS:   STG Name Target Date Goal status  1 Patient will be I with initial HEP in order to progress with therapy. Baseline: patient independent with HEP 04/03/2021 ACHIEVED  2 PT will review FOTO with patient by 3rd visit in order to understand expected progress and outcome with therapy. Baseline:  assessed at eval 04/03/2021 ACHIEVED  3 Patient will report resting pain level </= 3/10 in order to reduce function limitations Baseline: patient denies pain this visit 04/03/2021 ONGOING    LONG TERM GOALS:    LTG Name Target Date Goal status  1 Patient will be I with final HEP to maintain progress from PT. Baseline: provided at eval 04/24/2021 INITIAL  2 Patient will report >/= 67% status on FOTO to indicate improved functional ability. Baseline: 59% functional status 04/24/2021 Achieved   3 Patient will improve shoulder AROM to 160 deg in  order to allow for no pulling in the axilla when reaching into a cupboard Baseline: 140 deg 04/24/2021 INITIAL  4 Patient will demonstrate shoulder strength to 5/5 MMT in order to improve lifting ability Baseline: 4+/5 MMT and 4/5 MMT periscapular musculature 04/24/2021 INITIAL  5 Patient will report pain level </= 1/10 at rest to reduce functional limitations Baseline: 5/10          PLAN: PT FREQUENCY: 2x/week   PT DURATION: 6 weeks   PLANNED INTERVENTIONS: Therapeutic exercises, Therapeutic activity, Neuro Muscular re-education, Balance training, Gait training, Patient/Family education, Joint mobilization, Dry Needling, Spinal mobilization, and Manual therapy   PLAN FOR NEXT SESSION: Review HEP and progress PRN,assess response to TPDN , dry needling / manual for right lat/subscap/pec/upper trap regions, stretching and postural strengthening  Gwendolyn Grant, PT, DPT, ATC 04/08/21 5:19  PM

## 2021-04-08 ENCOUNTER — Ambulatory Visit: Payer: PPO

## 2021-04-08 ENCOUNTER — Other Ambulatory Visit: Payer: Self-pay

## 2021-04-08 DIAGNOSIS — M6281 Muscle weakness (generalized): Secondary | ICD-10-CM

## 2021-04-08 DIAGNOSIS — G8929 Other chronic pain: Secondary | ICD-10-CM

## 2021-04-08 DIAGNOSIS — M25511 Pain in right shoulder: Secondary | ICD-10-CM | POA: Diagnosis not present

## 2021-04-08 NOTE — Patient Instructions (Signed)

## 2021-04-10 ENCOUNTER — Encounter: Payer: Self-pay | Admitting: Physical Therapy

## 2021-04-10 ENCOUNTER — Ambulatory Visit: Payer: PPO | Admitting: Physical Therapy

## 2021-04-10 ENCOUNTER — Other Ambulatory Visit: Payer: Self-pay

## 2021-04-10 DIAGNOSIS — G8929 Other chronic pain: Secondary | ICD-10-CM

## 2021-04-10 DIAGNOSIS — M6281 Muscle weakness (generalized): Secondary | ICD-10-CM

## 2021-04-10 DIAGNOSIS — M25511 Pain in right shoulder: Secondary | ICD-10-CM | POA: Diagnosis not present

## 2021-04-10 NOTE — Therapy (Signed)
OUTPATIENT PHYSICAL THERAPY TREATMENT NOTE   Patient Name: [name] MRN: [id number] DOB:Dec 30, 1955, 66 y.o., female Today's Date: 04/10/2021  PCP: Azzie Glatter, FNP REFERRING PROVIDER: Wallace Going, DO   PT End of Session - 04/10/21 1136     Visit Number 7    Number of Visits 10    Date for PT Re-Evaluation 05/22/21    Authorization Type [employer]    PT Start Time 1133    PT Stop Time 1220    PT Time Calculation (min) 47 min    Activity Tolerance Patient tolerated treatment well    Behavior During Therapy WFL for tasks assessed/performed                  Past Medical History:  Diagnosis Date   Arthritis    Backache, unspecified    Cancer ([place])    breast s/p BL mastectomies   Colon polyps    Hyperlipidemia    PONV (postoperative nausea and vomiting)    Past Surgical History:  Procedure Laterality Date   BREAST RECONSTRUCTION WITH PLACEMENT OF TISSUE EXPANDER AND FLEX HD (ACELLULAR HYDRATED DERMIS) Bilateral 04/05/2019   Procedure: BILATERAL BREAST RECONSTRUCTION WITH PLACEMENT OF TISSUE EXPANDER AND FLEX HD (ACELLULAR HYDRATED DERMIS);  Surgeon: Wallace Going, DO;  Location: [facility];  Service: Plastics;  Laterality: Bilateral;   ENDOMETRIAL ABLATION  1998   KNEE SURGERY Right 2007   LIPOSUCTION WITH LIPOFILLING Bilateral 01/30/2020   Procedure: LIPOSUCTION WITH LIPOFILLING;  Surgeon: Wallace Going, DO;  Location: [facility];  Service: Plastics;  Laterality: Bilateral;  90 min, please   MASTECTOMY W/ SENTINEL NODE BIOPSY Bilateral 04/05/2019   Procedure: BILATERAL MASTECTOMIES WITH RIGHT SENTINEL LYMPH NODE BIOPSY;  Surgeon: [name], MD;  Location: [facility];  Service: General;  Laterality: Bilateral;   REMOVAL OF BILATERAL TISSUE EXPANDERS WITH PLACEMENT OF BILATERAL BREAST IMPLANTS Bilateral 09/26/2019   Procedure: REMOVAL OF BILATERAL TISSUE EXPANDERS WITH  PLACEMENT OF BILATERAL BREAST IMPLANTS;  Surgeon: Wallace Going, DO;  Location: [facility];  Service: Plastics;  Laterality: Bilateral;   [place]   Patient Active Problem List   Diagnosis Date Noted   S/P breast reconstruction, bilateral 09/30/2019   S/P mastectomy, bilateral 04/13/2019   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   Weight gain 09/09/2013   Encounter for counseling 11/25/2012   Screening for malignant neoplasm of the cervix 11/25/2012   Menopause 10/08/2012   Unspecified vitamin D deficiency 10/08/2012   Hyperlipemia 10/08/2012   Osteoarthritis 06/04/2012    REFERRING PROVIDER: Wallace Going, DO   REFERRING DIAG: S/P breast reconstruction, bilateral   THERAPY DIAG:  Chronic right shoulder pain   Muscle weakness (generalized)   ONSET DATE: 04/05/2019  PERTINENT HISTORY: Bilateral mastectomies with implant-based reconstruction, lymph node removal on right  PRECAUTIONS: None  WEIGHT BEARING RESTRICTIONS No    SUBJECTIVE: Patient reports feeling improvement since last visit with dry needling, she does continue to have some tension of right pectoral region.   PAIN:  Are you having pain? No VAS scale: 010 Pain location: Right axilla, breast region Pain orientation: Right  PAIN TYPE: Chronic Pain description: tight, wrenching  Aggravating factors: unknown Relieving factors: exercises, soft tissue work   PATIENT GOALS: Pain relief    OBJECTIVE:  *Unless otherwise noted all objective measures were captured on initial evaluation.   SURVEYS:  FOTO: 67% functional status (04/08/2021)   POSTURE: Rounded shoulder posture, right slightly more elevated compared to left   PALPATION: Tender right lat, subscap, pec, upper trap   UPPER EXTREMITY AROM:   Patient reported increased pulling in axillary and chest region with all right shoulder AROM AROM Right 03/13/2021 Left 03/13/2021   03/19/21  04/03/2021:  04/10/2021  Shoulder flexion 140 160 Rt 150 Right 150 Right: 155  Shoulder extension 60 60     Shoulder abduction 145 170 Rt 155    Shoulder internal rotation T12 T8  Right T10   Shoulder external rotation T1 T2       Right shoulder PROM grossly WFL, slight limitation with flexion compared to left side, increased stretch when shoulder externally rotated and then flexed overhead   UPPER EXTREMITY MMT:   MMT Right 03/13/2021 Left 03/13/2021 Right 04/10/2021  Shoulder flexion 4+ 5 4+  Shoulder extension 4+ 5 4+  Shoulder abduction 4+ 5 4+  Shoulder ER 4+ 5 4+  Shoulder IR 4+ 5 4+  Periscap musculature 4 4 4      TODAY'S TREATMENT:  04/10/2021: Therapeutic Exercise: UBE L1 x 4 min (2 fwd/bwd) while taking subjective Sidelying thoracic rotation x 5 each Child's pose 3 x 15 sec Child's pose position thread the needle with FM x 5 each Supine thoracic extension mobs over FR with hands behind head and elbows wide Supine pec stretch on FR x 15 sec at various ranges Supine snow angel AROM on FR x 3  Supine horizontal abudction and diagonals with yellow on FR x 10 each Manual: Skilled palpation and monitoring of muscle tension while performing TPDN treatment Myofrascial release and ART for right pec, lat, upper trap Trigger Point Dry Needling Treatment: Pre-treatment instruction: Patient instructed on dry needling rationale, procedures, and possible side effects including pain during treatment (achy,cramping feeling), bruising, drop of blood, lightheadedness, nausea, sweating. Patient Consent Given: Yes Education handout provided: No Muscles treated: Right pec, upper trap, infraspinatus  Needle size and number: .30x6mm x 3 Electrical stimulation performed: No Parameters: N/A Treatment response/outcome: Twitch response elicited, Palpable decrease in muscle tension, and patient reported improvement in symptoms Post-treatment instructions: Patient instructed to  expect possible mild to moderate muscle soreness later today and/or tomorrow. Patient instructed in methods to reduce muscle soreness and to continue prescribed HEP. If patient was dry needled over the lung field, patient was instructed on signs and symptoms of pneumothorax and, however unlikely, to see immediate medical attention should they occur. Patient was also educated on signs and symptoms of infection and to seek medical attention should they occur. Patient verbalized understanding of these instructions and education.   04/08/2021: Therapeutic Exercise: UBE 2.5 min each fwd/bwd level 2.5  Pec stretch on roller 2 minutes Resisted shoulder abduction and diagonals yellow band 2 x 10  Verbal update to HEP to include resisted diagonals  Manual Therapy: STM/DTM/Trigger point release Rt latissimus dorsi, subscapularis, pectorals  Passive stretching of Rt lat and pectorals  Trigger Point Dry Needling Treatment: Pre-treatment instruction: Patient instructed on dry needling rationale, procedures, and possible side effects including pain during treatment (achy,cramping feeling), bruising, drop of blood, lightheadedness, nausea, sweating. Patient Consent Given: Yes Education handout provided: Previously provided Muscles treated: Rt pectoralis major/minor    Treatment response/outcome: Palpable decrease in muscle tension Post-treatment instructions: Patient instructed to expect possible mild to moderate muscle soreness later today and/or tomorrow. Patient instructed in methods to reduce muscle soreness and to continue prescribed HEP. If patient was  dry needled over the lung field, patient was instructed on signs and symptoms of pneumothorax and, however unlikely, to see immediate medical attention should they occur. Patient was also educated on signs and symptoms of infection and to seek medical attention should they occur. Patient verbalized understanding of these instructions and education. Self-Care:   discussed and answered patient's questions regarding [place] [employer] for continued fitness training following PT discharge.   04/03/2021: There-ex: Sidelying thoracic rotation x 10 each Child's pose stretch 2 x 20 sec Cat cow x 10 Row with green x 15 Extension + scap retraction with green  x 15 Banded shoulder ER / IR x 15 each High Row (lat) with green x 15 Serratus press from elevated table x 10 Bird dog standing from elevated table x 20 Manual: Right GHJ mobs in inferior/posterior directions at various ranges of elevation Right shoulder PROM all directions Right scap pinned stretching for lat and posterior cuff   PATIENT EDUCATION: Education details: HEP, updating POC to reduce frequency of visits Person educated: Patient Education method: Explanation, demo, handout, verbal cues  Education comprehension: Verbalized understanding,returned demo, verbal cues.    HOME EXERCISE PROGRAM: Access Code: [email or web address]     ASSESSMENT: CLINICAL IMPRESSION: Patient tolerated therapy well with no adverse effects. Continued with dry needling treatment this visit for right shoulder. Twitch response only elicited with right upper trap but patient did report immediate improvement in symptoms following treatment, and followed up dry needling with stretching and mobility exercises. She continues to demonstrate improvement in her right shoulder range of motion but with gross right shoulder strength deficits. She did report improvement in her functional ability on FOTO and is making great progress toward her goals. Patient would benefit from continued skilled PT to further progress her mobility and strength in order to reduce pain and maximize functional ability, so will extend POC for 6 more weeks at a frequency of every 2nd or 3rd week.  Objective impairments include decreased ROM, decreased strength, impaired flexibility, postural dysfunction, and pain.     GOALS: Goals reviewed with  patient? Yes   SHORT TERM GOALS:   STG Name Target Date Goal status  1 Patient will be I with initial HEP in order to progress with therapy. Baseline: patient independent with HEP 04/03/2021 ACHIEVED  2 PT will review FOTO with patient by 3rd visit in order to understand expected progress and outcome with therapy. Baseline:  assessed at eval 04/03/2021 ACHIEVED  3 Patient will report resting pain level </= 3/10 in order to reduce function limitations Baseline: patient denies pain this visit 04/03/2021 ACHIEVED    LONG TERM GOALS:    LTG Name Target Date Goal status  1 Patient will be I with final HEP to maintain progress from PT. Baseline: progress HEP 05/22/2021 ONGOING  2 Patient will report >/= 67% status on FOTO to indicate improved functional ability. Baseline: 67% functional status 05/22/2021 ACHIEVED  3 Patient will improve shoulder AROM to 160 deg in order to allow for no pulling in the axilla when reaching into a cupboard Baseline: 155 deg 05/22/2021 ONGOING  4 Patient will demonstrate shoulder strength to 5/5 MMT in order to improve lifting ability Baseline: 4+/5 MMT and 4/5 MMT periscapular musculature 05/22/2021 ONGOING  5 Patient will report pain level </= 1/10 at rest to reduce functional limitations Baseline: patient does continue to report tension of right pectoral region  05/22/2021 ONGOING       PLAN: PT FREQUENCY: biweekly or every 3rd week  PT DURATION: 6 weeks   PLANNED INTERVENTIONS: Therapeutic exercises, Therapeutic activity, Neuro Muscular re-education, Balance training, Gait training, Patient/Family education, Joint mobilization, Dry Needling, Spinal mobilization, and Manual therapy   PLAN FOR NEXT SESSION: Review HEP and progress PRN, dry needling / manual for right lat/subscap/pec/upper trap regions, stretching and postural strengthening   Hilda Blades, PT, DPT, LAT, ATC 04/10/21  12:43 PM Phone: [phone] Fax: [phone]

## 2021-04-27 NOTE — Therapy (Signed)
OUTPATIENT PHYSICAL THERAPY TREATMENT NOTE  DISCHARGE   Patient Name: [name] MRN: [id number] DOB:03-17-1955, 66 y.o., female Today's Date: 05/01/2021  PCP: Azzie Glatter, FNP REFERRING PROVIDER: Azzie Glatter, FNP   PT End of Session - 05/01/21 1117     Visit Number 8    Number of Visits 10    Date for PT Re-Evaluation 05/22/21    Authorization Type [employer]    PT Start Time 1102    PT Stop Time 1130    PT Time Calculation (min) 28 min    Activity Tolerance Patient tolerated treatment well    Behavior During Therapy WFL for tasks assessed/performed                   Past Medical History:  Diagnosis Date   Arthritis    Backache, unspecified    Cancer ([place])    breast s/p BL mastectomies   Colon polyps    Hyperlipidemia    PONV (postoperative nausea and vomiting)    Past Surgical History:  Procedure Laterality Date   BREAST RECONSTRUCTION WITH PLACEMENT OF TISSUE EXPANDER AND FLEX HD (ACELLULAR HYDRATED DERMIS) Bilateral 04/05/2019   Procedure: BILATERAL BREAST RECONSTRUCTION WITH PLACEMENT OF TISSUE EXPANDER AND FLEX HD (ACELLULAR HYDRATED DERMIS);  Surgeon: Wallace Going, DO;  Location: [facility];  Service: Plastics;  Laterality: Bilateral;   ENDOMETRIAL ABLATION  1998   KNEE SURGERY Right 2007   LIPOSUCTION WITH LIPOFILLING Bilateral 01/30/2020   Procedure: LIPOSUCTION WITH LIPOFILLING;  Surgeon: Wallace Going, DO;  Location: [place];  Service: Plastics;  Laterality: Bilateral;  90 min, please   MASTECTOMY W/ SENTINEL NODE BIOPSY Bilateral 04/05/2019   Procedure: BILATERAL MASTECTOMIES WITH RIGHT SENTINEL LYMPH NODE BIOPSY;  Surgeon: [name], MD;  Location: [place];  Service: General;  Laterality: Bilateral;   REMOVAL OF BILATERAL TISSUE EXPANDERS WITH PLACEMENT OF BILATERAL BREAST IMPLANTS Bilateral 09/26/2019   Procedure: REMOVAL OF BILATERAL TISSUE  EXPANDERS WITH PLACEMENT OF BILATERAL BREAST IMPLANTS;  Surgeon: Wallace Going, DO;  Location: [facility];  Service: Plastics;  Laterality: Bilateral;   [place]   Patient Active Problem List   Diagnosis Date Noted   S/P breast reconstruction, bilateral 09/30/2019   S/P mastectomy, bilateral 04/13/2019   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   Weight gain 09/09/2013   Encounter for counseling 11/25/2012   Screening for malignant neoplasm of the cervix 11/25/2012   Menopause 10/08/2012   Unspecified vitamin D deficiency 10/08/2012   Hyperlipemia 10/08/2012   Osteoarthritis 06/04/2012    REFERRING PROVIDER: Wallace Going, DO   REFERRING DIAG: S/P breast reconstruction, bilateral   THERAPY DIAG:  Chronic right shoulder pain   Muscle weakness (generalized)   ONSET DATE: 04/05/2019  PERTINENT HISTORY: Bilateral mastectomies with implant-based reconstruction, lymph node removal on right  PRECAUTIONS: None  WEIGHT BEARING RESTRICTIONS No    SUBJECTIVE: Patient reports she has been really good since last visit. She hasn't had much pain over the past 3 weeks, and she feels comfortable with all her exercises.  PAIN:  Are you having pain? No VAS scale: 010 Pain location: Right axilla, breast region Pain orientation: Right  PAIN TYPE: Chronic Pain description: tight, wrenching  Aggravating factors: unknown Relieving factors: exercises, soft tissue work   PATIENT GOALS: Pain relief    OBJECTIVE:  *Unless otherwise noted all objective measures  were captured on initial evaluation.   SURVEYS:  FOTO: 67% functional status (04/08/2021 achieved predicted value)   POSTURE: Slightly rounded shoulder posture - 05/01/2021   PALPATION: No tenderness noted - 05/01/2021   UPPER EXTREMITY AROM:   Patient reported increased pulling in axillary and chest region with all right shoulder AROM AROM  Right 03/13/2021 Left 03/13/2021  04/03/2021:  04/10/2021 Right 05/01/2021  Shoulder flexion 140 160 Right 150 Right: 155 160  Shoulder extension 60 60     Shoulder abduction 145 170   160  Shoulder internal rotation T12 T8 Right T10  T8  Shoulder external rotation T1 T2       Right shoulder PROM grossly WFL, slight limitation with flexion compared to left side, increased stretch when shoulder externally rotated and then flexed overhead   UPPER EXTREMITY MMT:   MMT Right 03/13/2021 Left 03/13/2021 Right 04/10/2021 Right 05/01/2021  Shoulder flexion 4+ 5 4+ 5  Shoulder extension 4+ 5 4+ 5  Shoulder abduction 4+ 5 4+ 5  Shoulder ER 4+ 5 4+ 5  Shoulder IR 4+ 5 4+ 5  Periscap musculature _0 TODAY'S TREATMENT:  05/01/2021: Therapeutic Exercise: UBE L1 x 4 min (2 fwd/bwd) while taking subjective Sidelying thoracic rotation 5 x 5 sec each Supine horizontal abudction with green x 15 Child's pose 3 x 15 sec Seated double er and scap retraction with green x 15 Corner pec stretch 3 x 20 sec Row with green x 15 Seated thoracic extension over chair x 5   04/10/2021: Therapeutic Exercise: UBE L1 x 4 min (2 fwd/bwd) while taking subjective Sidelying thoracic rotation x 5 each Child's pose 3 x 15 sec Child's pose position thread the needle with FM x 5 each Supine thoracic extension mobs over FR with hands behind head and elbows wide Supine pec stretch on FR x 15 sec at various ranges Supine snow angel AROM on FR x 3  Supine horizontal abudction and diagonals with yellow on FR x 10 each Manual: Skilled palpation and monitoring of muscle tension while performing TPDN treatment Myofrascial release and ART for right pec, lat, upper trap Trigger Point Dry Needling Treatment: Pre-treatment instruction: Patient instructed on dry needling rationale, procedures, and possible side effects including pain during treatment (achy,cramping feeling), bruising, drop of blood, lightheadedness,  nausea, sweating. Patient Consent Given: Yes Education handout provided: No Muscles treated: Right pec, upper trap, infraspinatus  Needle size and number: .30x65m x 3 Electrical stimulation performed: No Parameters: N/A Treatment response/outcome: Twitch response elicited, Palpable decrease in muscle tension, and patient reported improvement in symptoms Post-treatment instructions: Patient instructed to expect possible mild to moderate muscle soreness later today and/or tomorrow. Patient instructed in methods to reduce muscle soreness and to continue prescribed HEP. If patient was dry needled over the lung field, patient was instructed on signs and symptoms of pneumothorax and, however unlikely, to see immediate medical attention should they occur. Patient was also educated on signs and symptoms of infection and to seek medical attention should they occur. Patient verbalized understanding of these instructions and education.  04/08/2021: Therapeutic Exercise: UBE 2.5 min each fwd/bwd level 2.5  Pec stretch on roller 2 minutes Resisted shoulder abduction and diagonals yellow band 2 x 10  Verbal update to HEP to include resisted diagonals  Manual Therapy: STM/DTM/Trigger point release Rt latissimus dorsi, subscapularis, pectorals  Passive stretching of Rt lat and pectorals Trigger Point Dry Needling Treatment: Pre-treatment instruction: Patient instructed on dry needling  rationale, procedures, and possible side effects including pain during treatment (achy,cramping feeling), bruising, drop of blood, lightheadedness, nausea, sweating. Patient Consent Given: Yes Education handout provided: Previously provided Muscles treated: Rt pectoralis major/minor    Treatment response/outcome: Palpable decrease in muscle tension Post-treatment instructions: Patient instructed to expect possible mild to moderate muscle soreness later today and/or tomorrow. Patient instructed in methods to reduce muscle  soreness and to continue prescribed HEP. If patient was dry needled over the lung field, patient was instructed on signs and symptoms of pneumothorax and, however unlikely, to see immediate medical attention should they occur. Patient was also educated on signs and symptoms of infection and to seek medical attention should they occur. Patient verbalized understanding of these instructions and education. Self-Care:  discussed and answered patient's questions regarding [place] [employer] for continued fitness training following PT discharge.    PATIENT EDUCATION: Education details: POC discharge, HEP finalization Person educated: Patient Education method: Explanation Education comprehension: Verbalized, demonstration   HOME EXERCISE PROGRAM: Access Code: [email or web address]     ASSESSMENT: CLINICAL IMPRESSION: Patient tolerated therapy well with no adverse effects. Patient has achieved all established goals, demonstrating improvement in her range of motion and strength, functional improvement on FOTO, and relief of pain with activity. She is independent with her HEP so will be discharged from PT as it is no longer indicated.   Objective impairments include decreased ROM, decreased strength, impaired flexibility, postural dysfunction, and pain.     GOALS: Goals reviewed with patient? Yes   SHORT TERM GOALS:   STG Name Target Date Goal status  1 Patient will be I with initial HEP in order to progress with therapy. Baseline: patient independent with HEP 04/03/2021 ACHIEVED  2 PT will review FOTO with patient by 3rd visit in order to understand expected progress and outcome with therapy. Baseline:  assessed at eval 04/03/2021 ACHIEVED  3 Patient will report resting pain level </= 3/10 in order to reduce function limitations Baseline: patient denies pain this visit 04/03/2021 ACHIEVED    LONG TERM GOALS:    LTG Name Target Date Goal status  1 Patient will be I with final HEP to maintain progress  from PT. Baseline: independent 05/22/2021 ACHIEVED  2 Patient will report >/= 67% status on FOTO to indicate improved functional ability. Baseline: 67% functional status 05/22/2021 ACHIEVED  3 Patient will improve shoulder AROM to 160 deg in order to allow for no pulling in the axilla when reaching into a cupboard Baseline: 160 deg 05/22/2021 ACHIEVED  4 Patient will demonstrate shoulder strength to 5/5 MMT in order to improve lifting ability Baseline: shoulder strength grossly 5/5 MMT 05/22/2021 ACHIEVED  5 Patient will report pain level </= 1/10 at rest to reduce functional limitations Baseline: patient denies any recent pain  05/22/2021 ACHIEVED       PLAN: PT FREQUENCY: biweekly or every 3rd week   PT DURATION: 6 weeks   PLANNED INTERVENTIONS: Therapeutic exercises, Therapeutic activity, Neuro Muscular re-education, Balance training, Gait training, Patient/Family education, Joint mobilization, Dry Needling, Spinal mobilization, and Manual therapy   PLAN FOR NEXT SESSION: NA - discharge   Hilda Blades, PT, DPT, LAT, ATC 05/01/21  11:32 AM Phone: [phone] Fax: [phone]    PHYSICAL THERAPY DISCHARGE SUMMARY  Visits from Start of Care: 8  Current functional level related to goals / functional outcomes: See above   Remaining deficits: See above   Education / Equipment: HEP   Patient agrees to discharge. Patient goals were met. Patient is  being discharged due to meeting the stated rehab goals.

## 2021-05-01 ENCOUNTER — Other Ambulatory Visit: Payer: Self-pay

## 2021-05-01 ENCOUNTER — Ambulatory Visit: Payer: PPO | Attending: Plastic Surgery | Admitting: Physical Therapy

## 2021-05-01 ENCOUNTER — Encounter: Payer: Self-pay | Admitting: Physical Therapy

## 2021-05-01 DIAGNOSIS — M25511 Pain in right shoulder: Secondary | ICD-10-CM | POA: Diagnosis not present

## 2021-05-01 DIAGNOSIS — M6281 Muscle weakness (generalized): Secondary | ICD-10-CM | POA: Insufficient documentation

## 2021-05-01 DIAGNOSIS — G8929 Other chronic pain: Secondary | ICD-10-CM | POA: Insufficient documentation

## 2021-05-01 NOTE — Patient Instructions (Signed)
Access Code: [email or web address] ?URL: [email or web address] ?Date: 05/01/2021 ?Prepared by: Hilda Blades ? ?Exercises ?Sidelying Thoracic Rotation - 1-2 x daily - 7 x weekly - 2 sets - 10 reps ?Child's Pose Stretch - 1-2 x daily - 7 x weekly - 3 sets - 30 sec hold ?Seated Thoracic Lumbar Extension - 1-2 x daily - 7 x weekly - 2 sets - 10 reps ?Corner Pec Major Stretch - 1-2 x daily - 7 x weekly - 3 reps - 20-30 seconds hold ?Standing Glute Med Mobilization with Small Ball on Wall - 2 x daily - 2-3 minutes hold ?Chest Mobilization with Small Ball - 2 x daily - 2-3 minutes hold ?Standing Bilateral Low Shoulder Row with Anchored Resistance - 1 x daily - 7 x weekly - 2 sets - 10 reps ?Shoulder External Rotation and Scapular Retraction with Resistance - 1 x daily - 7 x weekly - 2 sets - 10 reps ?Supine Shoulder Horizontal Abduction with Resistance - 1 x daily - 7 x weekly - 2 sets - 10 reps ? ?

## 2021-07-31 ENCOUNTER — Telehealth: Payer: Self-pay | Admitting: Genetic Counselor

## 2021-07-31 NOTE — Telephone Encounter (Signed)
Scheduled appt per 6/1 referral. Pt is aware of appt date and time. Pt is aware to arrive 15 mins prior to appt time and to bring and updated insurance card. Pt is aware of appt location.

## 2021-09-11 ENCOUNTER — Encounter: Payer: PPO | Admitting: Plastic Surgery

## 2021-09-12 ENCOUNTER — Ambulatory Visit (INDEPENDENT_AMBULATORY_CARE_PROVIDER_SITE_OTHER): Payer: Self-pay | Admitting: Plastic Surgery

## 2021-09-12 ENCOUNTER — Encounter: Payer: Self-pay | Admitting: Plastic Surgery

## 2021-09-12 DIAGNOSIS — Z719 Counseling, unspecified: Secondary | ICD-10-CM

## 2021-09-12 NOTE — Progress Notes (Signed)

## 2021-09-17 ENCOUNTER — Other Ambulatory Visit: Payer: PPO

## 2021-09-17 ENCOUNTER — Encounter: Payer: PPO | Admitting: Genetic Counselor

## 2021-10-26 ENCOUNTER — Other Ambulatory Visit: Payer: Self-pay | Admitting: Genetic Counselor

## 2021-10-26 ENCOUNTER — Inpatient Hospital Stay (HOSPITAL_BASED_OUTPATIENT_CLINIC_OR_DEPARTMENT_OTHER): Payer: PPO | Admitting: Genetic Counselor

## 2021-10-26 ENCOUNTER — Inpatient Hospital Stay: Payer: PPO | Attending: Genetic Counselor

## 2021-10-26 ENCOUNTER — Other Ambulatory Visit: Payer: Self-pay

## 2021-10-26 DIAGNOSIS — Z800 Family history of malignant neoplasm of digestive organs: Secondary | ICD-10-CM | POA: Diagnosis not present

## 2021-10-26 DIAGNOSIS — C50911 Malignant neoplasm of unspecified site of right female breast: Secondary | ICD-10-CM

## 2021-10-26 DIAGNOSIS — D0511 Intraductal carcinoma in situ of right breast: Secondary | ICD-10-CM

## 2021-10-26 DIAGNOSIS — Z853 Personal history of malignant neoplasm of breast: Secondary | ICD-10-CM

## 2021-10-26 DIAGNOSIS — Z803 Family history of malignant neoplasm of breast: Secondary | ICD-10-CM | POA: Diagnosis not present

## 2021-10-26 LAB — GENETIC SCREENING ORDER

## 2021-10-26 NOTE — Progress Notes (Unsigned)
REFERRING PROVIDER: [name], MD [address] [place],  [place] [phone]  PRIMARY PROVIDER:  Azzie Glatter, FNP  PRIMARY REASON FOR VISIT:  No diagnosis found.   HISTORY OF PRESENT ILLNESS:   Ms. Crossett, a 66 y.o. female, was seen for a [facility] cancer genetics consultation at the request of Dr. Marlou Starks due to a personal and family history of cancer.  Ms. Halliday presents to clinic today to discuss the possibility of a hereditary predisposition to cancer, to discuss genetic testing, and to further clarify her future cancer risks, as well as potential cancer risks for family members.   At the age of 81, Ms. 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 The treatment plan included bilateral mastectomies.  5 year ago. Ms. 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   CANCER HISTORY:  Oncology History   No history exists.     RISK FACTORS:  Colonoscopy: yes;  history of colon polyps and colonoscopies every other year.  Marland Kitchen Hysterectomy: no.  Ovaries intact: yes.  Menarche was at age 48.  First live birth at age 81.  Menopausal status: postmenopausal.  HRT use: 2 years; stopped around 2010 Any excessive radiation exposure before age 73: no Dermatology screening: as needed; Dr. Nevada Crane  Past Medical History:  Diagnosis Date   Arthritis    Backache, unspecified    Cancer ([place])    breast s/p BL mastectomies   Colon polyps    Hyperlipidemia    PONV (postoperative nausea and vomiting)     Past Surgical History:  Procedure Laterality Date   BREAST RECONSTRUCTION WITH PLACEMENT OF TISSUE EXPANDER AND FLEX HD (ACELLULAR HYDRATED DERMIS) Bilateral 04/05/2019   Procedure: BILATERAL BREAST RECONSTRUCTION WITH PLACEMENT OF TISSUE EXPANDER AND FLEX HD (ACELLULAR HYDRATED DERMIS);  Surgeon: Wallace Going, DO;  Location: [facility];  Service: Plastics;   Laterality: Bilateral;   ENDOMETRIAL ABLATION  1998   KNEE SURGERY Right 2007   LIPOSUCTION WITH LIPOFILLING Bilateral 01/30/2020   Procedure: LIPOSUCTION WITH LIPOFILLING;  Surgeon: Wallace Going, DO;  Location: [facility];  Service: Plastics;  Laterality: Bilateral;  90 min, please   MASTECTOMY W/ SENTINEL NODE BIOPSY Bilateral 04/05/2019   Procedure: BILATERAL MASTECTOMIES WITH RIGHT SENTINEL LYMPH NODE BIOPSY;  Surgeon: [name], MD;  Location: [facility];  Service: General;  Laterality: Bilateral;   REMOVAL OF BILATERAL TISSUE EXPANDERS WITH PLACEMENT OF BILATERAL BREAST IMPLANTS Bilateral 09/26/2019   Procedure: REMOVAL OF BILATERAL TISSUE EXPANDERS WITH PLACEMENT OF BILATERAL BREAST IMPLANTS;  Surgeon: Wallace Going, DO;  Location: [facility];  Service: Plastics;  Laterality: Bilateral;   RHINOPLASTY  1977   TONSILLECTOMY  1961    FAMILY HISTORY:  We obtained a detailed, 4-generation family history.  Significant diagnoses are listed below: Family History  Problem Relation Age of Onset   Arthritis Mother    Cancer Mother        Colon    Melanoma Father    Cancer Maternal Grandmother        Breast    Colon cancer Maternal Grandmother    Cancer Maternal Grandfather        Lung (Smoker)    Hyperlipidemia Paternal Grandmother    Cancer Paternal Grandfather        Lung (Smoker)     Ms. Sterry is {aware/unaware}  of previous family history of genetic testing for hereditary cancer risks. Patient's maternal ancestors are of *** descent, and paternal ancestors are of *** descent. There {IS NO:12509} reported Ashkenazi Jewish ancestry. There {IS NO:12509} known consanguinity.  GENETIC COUNSELING ASSESSMENT: Ms. Mcnelly is a 66 y.o. female with a {Personal/family:20331} history of {cancer/polyps} which is somewhat suggestive of a {DISEASE} and predisposition to cancer given ***. We, therefore, discussed and recommended the  following at today's visit.   DISCUSSION: We discussed that *** - ***% of *** is hereditary.  Most cases of *** associated with ***.  There are other genes that can be associated with hereditary *** cancer syndromes.  These include ***.  We discussed that testing is beneficial for several reasons including knowing how to follow individuals for their cancer risks, identifying whether potential treatment options *** would be beneficial, and understanding if other family members could be at risk for cancer and allowing them to undergo genetic testing.   We reviewed the characteristics, features and inheritance patterns of hereditary cancer syndromes. We also discussed genetic testing, including the appropriate family members to test, the process of testing, insurance coverage and turn-around-time for results. We discussed the implications of a negative, positive, carrier and/or variant of uncertain significant result. We recommended Ms. Allums pursue genetic testing for a panel that includes genes associated with *** cancer.   Ms. Herbst  was offered a common hereditary cancer panel (47 genes) and an expanded pan-cancer panel (77 genes). Ms. Lockner was informed of the benefits and limitations of each panel, including that expanded pan-cancer panels contain genes that do not have clear management guidelines at this point in time.  We also discussed that as the number of genes included on a panel increases, the chances of variants of uncertain significance increases.  After considering the benefits and limitations of each gene panel, Ms. Caywood  elected to have *** through ***.   Based on Ms. Furtick's {Personal/family:20331} history of cancer, she meets medical criteria for genetic testing. Despite that she meets criteria, she may still have an out of pocket cost. We discussed that if her out of pocket cost for testing is over $100, the laboratory should contact her and discuss the self-pay prices and/or patient  pay assistance programs.    ***We reviewed the characteristics, features and inheritance patterns of hereditary cancer syndromes. We also discussed genetic testing, including the appropriate family members to test, the process of testing, insurance coverage and turn-around-time for results. We discussed the implications of a negative, positive and/or variant of uncertain significant result. In order to get genetic test results in a timely manner so that Ms. Liberatore can use these genetic test results for surgical decisions, we recommended Ms. Schmiesing pursue genetic testing for the ***. Once complete, we recommend Ms. Jollie pursue reflex genetic testing to the *** gene panel.   Based on Ms. Nance's {Personal/family:20331} history of cancer, she meets medical criteria for genetic testing. Despite that she meets criteria, she may still have an out of pocket cost.   ***We discussed with Ms. Barnaby that the {Personal/family:20331} history does not meet insurance or NCCN criteria for genetic testing and, therefore, is not highly consistent with a familial hereditary cancer syndrome.  We feel she is at low risk to harbor a gene mutation associated with such a condition. Thus, we did not recommend any genetic testing, at this time, and recommended Ms. Winegardner continue to follow the cancer screening guidelines given by her primary healthcare provider.  ***  In order to estimate her chance of having a {CA GENE:62345} mutation, we used statistical models ({GENMODELS:62370}) that consider her personal medical history, family history and ancestry.  Because each model is different, there can be a lot of variability in the risks they give.  Therefore, these numbers must be considered a rough range and not a precise risk of having a {CA GENE:62345} mutation.  These models estimate that she has approximately a ***-***% chance of having a mutation. Based on this assessment of her family and personal history, genetic testing  {IS/ISNOT:34056} recommended.  ***Based on the patient's {Personal/family:20331} history, a statistical model ({GENMODELS:62370}) was used to estimate her risk of developing {CA HX:54794}. This estimates her lifetime risk of developing {CA HX:54794} to be approximately ***%. This estimation does not consider any genetic testing results.  The patient's lifetime breast cancer risk is a preliminary estimate based on available information using one of several models endorsed by the [place] (ACS). The ACS recommends consideration of breast MRI screening as an adjunct to mammography for patients at high risk (defined as 20% or greater lifetime risk).   ***Ms. Kopper has been determined to be at high risk for breast cancer.  Therefore, we recommend that annual screening with mammography and breast MRI be performed.  ***begin at age 69, or 10 years prior to the age of breast cancer diagnosis in a relative (whichever is earlier).  We discussed that Ms. Bronder should discuss her individual situation with her referring physician and determine a breast cancer screening plan with which they are both comfortable.    PLAN: After considering the risks, benefits, and limitations, Ms. Locust provided informed consent to pursue genetic testing and the blood sample was sent to {Lab} Laboratories for analysis of the {test}. Results should be available within approximately {TAT TIME} weeks' time, at which point they will be disclosed by telephone to Ms. Blackley, as will any additional recommendations warranted by these results. Ms. Albin will receive a summary of her genetic counseling visit and a copy of her results once available. This information will also be available in Epic.   *** Despite our recommendation, Ms. Liguori did not wish to pursue genetic testing at today's visit. We understand this decision and remain available to coordinate genetic testing at any time in the future. We, therefore, recommend Ms.  Yum continue to follow the cancer screening guidelines given by her primary healthcare provider.  ***Based on Ms. Santana's family history, we recommended her ***, who was diagnosed with *** at age ***, have genetic counseling and testing. Ms. Maret will let us know if we can be of any assistance in coordinating genetic counseling and/or testing for this family member.   Lastly, we encouraged Ms. Wirthlin to remain in contact with cancer genetics annually so that we can continuously update the family history and inform her of any changes in cancer genetics and testing that may be of benefit for this family.   Ms. Goodnow questions were answered to her satisfaction today. Our contact information was provided should additional questions or concerns arise. Thank you for the referral and allowing [place] to share in the care of your patient.   Samone Guhl M. Joette Catching, [place], [facility] Genetic Counselor [email or web address] (P) [phone]  The patient was seen for a total of *** minutes in face-to-face genetic counseling.  ***The was accompanied by ***.  ***The patient was seen alone.  Drs. Lindi Adie and/or Burr Medico were available to discuss this case as needed.    _______________________________________________________________________  For Office Staff:  Number of people involved in session: *** Was an Intern/ student involved with case: {YES/NO:63}

## 2021-10-27 ENCOUNTER — Encounter: Payer: Self-pay | Admitting: Genetic Counselor

## 2021-10-27 DIAGNOSIS — Z803 Family history of malignant neoplasm of breast: Secondary | ICD-10-CM

## 2021-10-27 DIAGNOSIS — Z800 Family history of malignant neoplasm of digestive organs: Secondary | ICD-10-CM | POA: Insufficient documentation

## 2021-10-27 HISTORY — DX: Family history of malignant neoplasm of digestive organs: Z80.0

## 2021-10-27 HISTORY — DX: Family history of malignant neoplasm of breast: Z80.3

## 2021-11-10 ENCOUNTER — Telehealth: Payer: Self-pay | Admitting: Genetic Counselor

## 2021-11-10 ENCOUNTER — Encounter: Payer: Self-pay | Admitting: Genetic Counselor

## 2021-11-10 DIAGNOSIS — Z1379 Encounter for other screening for genetic and chromosomal anomalies: Secondary | ICD-10-CM | POA: Insufficient documentation

## 2021-11-10 NOTE — Telephone Encounter (Signed)
Contacted patient in attempt to disclose results of genetic testing.  LVM with contact information requesting a call back.  

## 2021-11-11 NOTE — Telephone Encounter (Signed)
Revealed negative genetics and VUS in [facility].    

## 2021-11-19 ENCOUNTER — Ambulatory Visit: Payer: Self-pay | Admitting: Genetic Counselor

## 2021-11-19 DIAGNOSIS — Z1379 Encounter for other screening for genetic and chromosomal anomalies: Secondary | ICD-10-CM

## 2021-11-19 DIAGNOSIS — Z800 Family history of malignant neoplasm of digestive organs: Secondary | ICD-10-CM

## 2021-11-19 DIAGNOSIS — D0511 Intraductal carcinoma in situ of right breast: Secondary | ICD-10-CM

## 2021-11-19 DIAGNOSIS — Z803 Family history of malignant neoplasm of breast: Secondary | ICD-10-CM

## 2021-11-19 NOTE — Progress Notes (Signed)
HPI:   [name] was previously seen in the [facility] clinic due to a personal and family history of cancer and concerns regarding a hereditary predisposition to cancer. Please refer to our prior cancer genetics clinic note for more information regarding our discussion, assessment and recommendations, at the time. [name] recent genetic test results were disclosed to [name], as were recommendations warranted by these results. These results and recommendations are discussed in more detail below.  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 The treatment plan included bilateral mastectomies.  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     FAMILY HISTORY:  We obtained a detailed, 4-generation family history.  Significant diagnoses are listed below:      Family History  Problem Relation Age of Onset   Colon cancer Mother 9   Melanoma Father          dx 75s; nose   Lung cancer Paternal Uncle          dx after 33; smoking hx   Breast cancer Maternal Grandmother          dx 69s   Cancer Paternal Grandfather          unknown type; d. before 39   Colon cancer Other          MGM's mother       [name] is unaware of previous family history of genetic testing for hereditary cancer risks. There is no reported Ashkenazi Jewish ancestry. There is no known consanguinity.  GENETIC TEST RESULTS:  The [facility] CancerNext-Expanded +RNA Panel Panel found no pathogenic mutations.   The CancerNext-Expanded gene panel offered by [facility] and includes sequencing, rearrangement, and RNA analysis for the following 77 genes: AIP, ALK, APC, ATM, AXIN2, BAP1, BARD1, BLM, BMPR1A, BRCA1, BRCA2, BRIP1, CDC73, CDH1, CDK4, CDKN1B, CDKN2A, CHEK2, CTNNA1, DICER1, FANCC, FH, FLCN, GALNT12, KIF1B, LZTR1, MAX, MEN1, MET, MLH1, MSH2, MSH3, MSH6,  MUTYH, NBN, NF1, NF2, NTHL1, PALB2, PHOX2B, PMS2, POT1, PRKAR1A, PTCH1, PTEN, RAD51C, RAD51D, RB1, RECQL, RET, SDHA, SDHAF2, SDHB, SDHC, SDHD, SMAD4, SMARCA4, SMARCB1, SMARCE1, STK11, SUFU, TMEM127, TP53, TSC1, TSC2, VHL and XRCC2 (sequencing and deletion/duplication); EGFR, EGLN1, HOXB13, KIT, MITF, PDGFRA, POLD1, and POLE (sequencing only); EPCAM and GREM1 (deletion/duplication only).   The test report has been scanned into EPIC and is located under the Molecular Pathology section of the Results Review tab.  A portion of the result report is included below for reference. Genetic testing reported out on November 10, 2021.    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  At this time, it is unknown if this variant is associated with an increased risk for cancer or if it is benign, but most uncertain variants are reclassified to benign. It should not be used to make medical management decisions. With time, we suspect the laboratory will determine the significance of this variant, if any. If the laboratory reclassifies this variant, we will attempt to contact [name] to discuss it further.   Even though a pathogenic variant was not identified, possible explanations for the cancer in the family may include: There may be no hereditary risk for cancer in the family. The cancers in [name] and/or [name] family may be sporadic/familial or due to other genetic and environmental factors.  There may be a gene mutation in one of these genes that current testing methods cannot detect but that chance is small. There could be another gene that has not yet been discovered, or that we have not yet tested, that is responsible for the cancer diagnoses in the family.  It is also possible there is a hereditary cause for the cancer in the family that [name] did not inherit.  Therefore, it is important to remain in touch with cancer genetics in the  future so that we can continue to offer [name] the most up to date genetic testing.    ADDITIONAL GENETIC TESTING:  We discussed with [name] that [name] genetic testing was fairly extensive.  If there are additional relevant genes identified to increase cancer risk that can be analyzed in the future, we would be happy to discuss and coordinate this testing at that time.     CANCER SCREENING RECOMMENDATIONS:  [name] test result is considered negative (normal).  This means that we have not identified a hereditary cause for [name] personal history of breast cancer at this time.   An individual's cancer risk and medical management are not determined by genetic test results alone. Overall cancer risk assessment incorporates additional factors, including personal medical history, family history, and any available genetic information that may result in a personalized plan for cancer prevention and surveillance. Therefore, it is recommended she continue to follow the cancer management and screening guidelines provided by [name] oncology and primary healthcare provider.  Based on the reported personal and family history, specific cancer screenings for [name] include: Colonoscopies at least every 5 years, or as recommended by their gastroenterologist, given the family history of colon cancer in [name] mother    RECOMMENDATIONS FOR FAMILY MEMBERS:   Since she did not inherit a identifiable mutation in a cancer predisposition gene included on this panel, [name] daughters could not have inherited a known mutation from [name] in one of these genes. Individuals in this family might be at some increased risk of developing cancer, over the general population risk, due to the family history of cancer.  Individuals in the family should notify their providers of the family history of cancer. We recommend women in this family have a yearly mammogram beginning at age 56, or 19 years younger than the  earliest onset of cancer, an annual clinical breast exam, and perform monthly breast self-exams.   First degree relatives of those with colon cancer should receive colonoscopies beginning at age 20, or 10 years prior to the earliest diagnosis of colon cancer in the family, and receive colonoscopies at least every 5 years, or as recommended by their gastroenterologist.   We do not recommend familial testing for the MSH3 variant of uncertain significance (VUS).  FOLLOW-UP:  Lastly, we discussed with [name] that cancer genetics is a rapidly advancing field and it is possible that new genetic tests will be appropriate for [name] and/or [name] family members in the future. We encouraged [name] to remain in contact with cancer genetics on an annual basis so we can update [name] personal and family histories and let [name] know of advances in cancer genetics that may benefit this family.   Our contact number was provided. [name] questions were answered to [name] satisfaction, and she knows she is welcome to call us at anytime with additional questions or concerns.   Deniyah Dillavou M. Joette Catching, [place], [facility] Genetic Counselor [email or web address] (P) [phone]

## 2021-12-04 ENCOUNTER — Ambulatory Visit (HOSPITAL_COMMUNITY)
Admission: RE | Admit: 2021-12-04 | Discharge: 2021-12-04 | Disposition: A | Discharge status: Home / Self Care | Payer: PPO | Attending: Gastroenterology | Admitting: Gastroenterology

## 2021-12-04 ENCOUNTER — Encounter (HOSPITAL_COMMUNITY): Payer: Self-pay | Admitting: Gastroenterology

## 2021-12-04 ENCOUNTER — Encounter (HOSPITAL_COMMUNITY)
Admission: RE | Disposition: A | Discharge status: Home / Self Care | Payer: Self-pay | Source: Home / Self Care | Attending: Gastroenterology

## 2021-12-04 DIAGNOSIS — D649 Anemia, unspecified: Secondary | ICD-10-CM | POA: Diagnosis not present

## 2021-12-04 HISTORY — PX: GIVENS CAPSULE STUDY: SHX5432

## 2021-12-04 SURGERY — IMAGING PROCEDURE, GI TRACT, INTRALUMINAL, VIA CAPSULE

## 2021-12-04 SURGICAL SUPPLY — 1 items: TOWEL COTTON PACK 4EA (MISCELLANEOUS) ×2 IMPLANT

## 2021-12-04 NOTE — Progress Notes (Signed)
Pt presents to endoscopy today for outpatient Givens Capsule study per order of MD Mann.  Pt ingested capsule @ 0815.  Per Givens capsule instructions, pt to remain NPO until 1015. Pt may progress to clear liquids at that time. Pt may have small snack at 1215. Pt may return to normal diet at 1615. Diet instructions provided to pt. Pt demonstrates understanding.  Capsule study will conclude at 1615. Pt to return capsule at that time. Pt demonstrated understanding of these instructions.  Debarah Crape, RN 12/04/21 8:22 AM

## 2021-12-07 ENCOUNTER — Encounter (HOSPITAL_COMMUNITY): Payer: Self-pay | Admitting: Gastroenterology

## 2021-12-08 ENCOUNTER — Other Ambulatory Visit (HOSPITAL_COMMUNITY): Payer: Self-pay | Admitting: Gastroenterology

## 2021-12-08 DIAGNOSIS — R933 Abnormal findings on diagnostic imaging of other parts of digestive tract: Secondary | ICD-10-CM

## 2021-12-09 ENCOUNTER — Ambulatory Visit (HOSPITAL_COMMUNITY)
Admission: RE | Admit: 2021-12-09 | Discharge: 2021-12-09 | Disposition: A | Discharge status: Home / Self Care | Payer: PPO | Source: Ambulatory Visit | Attending: Gastroenterology | Admitting: Gastroenterology

## 2021-12-09 DIAGNOSIS — R933 Abnormal findings on diagnostic imaging of other parts of digestive tract: Secondary | ICD-10-CM | POA: Insufficient documentation

## 2021-12-09 MED ORDER — IOHEXOL 350 MG/ML SOLN
80.0000 mL | Freq: Once | INTRAVENOUS | Status: AC | PRN
  Administered 2021-12-09: 80 mL via INTRAVENOUS

## 2022-01-27 ENCOUNTER — Telehealth: Payer: Self-pay | Admitting: Plastic Surgery

## 2022-01-27 NOTE — Telephone Encounter (Signed)
Wants to know if she needs to still come in for her yearly visit from her sx 04/2019. Pt would like a call back so she has time to cancel apt if she needs to.

## 2022-02-15 ENCOUNTER — Encounter: Payer: Self-pay | Admitting: Surgical

## 2022-02-15 ENCOUNTER — Ambulatory Visit (INDEPENDENT_AMBULATORY_CARE_PROVIDER_SITE_OTHER): Payer: Self-pay | Admitting: Surgical

## 2022-02-15 DIAGNOSIS — Z411 Encounter for cosmetic surgery: Secondary | ICD-10-CM

## 2022-02-15 NOTE — Progress Notes (Signed)
Botulinum Toxin Procedure Note  Procedure: Cosmetic botulinum toxin  Pre-operative Diagnosis: Dynamic rhytides  Post-operative Diagnosis: Same  Complications:  None  Brief history: The patient desires botulinum toxin injection.  She is aware of the risks including bleeding, damage to deeper structures, asymmetry, brow ptosis, eyelid ptosis, bruising. The patient understands and wishes to proceed.  Procedure: The area was prepped with alcohol and dried with a clean gauze.  Using a clean technique the botulinum toxin was diluted with 2.5 mL of bacteriostatic saline per 100 unit vial which resulted in 4 units per 0.1 mL.  Subsequently the mixture was injected in the glabellar, lateral canthal lines, forehead area with preservation of the temporal branch to the lateral eyebrow. A total of 32 Units of botulinum toxin was used. The forehead and glabellar area was injected with care to inject intramuscular only while holding pressure on the supratrochlear vessels in each area during each injection on either side of the medial corrugators. The injection proceeded vertically superiorly to the medial 2/3 of the frontalis muscle and superior 2/3 of the lateral frontalis, again with preservation of the frontal branch.  No complications were noted. Light pressure was held for 5 minutes. She was instructed explicitly in post-operative care.  Botox LOT:  [id number] EXP:  04/2024

## 2022-02-16 ENCOUNTER — Ambulatory Visit: Payer: PPO | Admitting: Plastic Surgery

## 2022-03-31 ENCOUNTER — Other Ambulatory Visit: Payer: Self-pay

## 2022-03-31 ENCOUNTER — Emergency Department (HOSPITAL_BASED_OUTPATIENT_CLINIC_OR_DEPARTMENT_OTHER): Payer: PPO

## 2022-03-31 ENCOUNTER — Inpatient Hospital Stay (HOSPITAL_BASED_OUTPATIENT_CLINIC_OR_DEPARTMENT_OTHER)
Admission: EM | Admit: 2022-03-31 | Discharge: 2022-04-02 | DRG: 390 | Disposition: A | Discharge status: Home / Self Care | Payer: PPO | Attending: Internal Medicine | Admitting: Internal Medicine

## 2022-03-31 DIAGNOSIS — Z803 Family history of malignant neoplasm of breast: Secondary | ICD-10-CM

## 2022-03-31 DIAGNOSIS — Z83438 Family history of other disorder of lipoprotein metabolism and other lipidemia: Secondary | ICD-10-CM

## 2022-03-31 DIAGNOSIS — Z79890 Hormone replacement therapy (postmenopausal): Secondary | ICD-10-CM

## 2022-03-31 DIAGNOSIS — Z801 Family history of malignant neoplasm of trachea, bronchus and lung: Secondary | ICD-10-CM

## 2022-03-31 DIAGNOSIS — Z8261 Family history of arthritis: Secondary | ICD-10-CM

## 2022-03-31 DIAGNOSIS — K219 Gastro-esophageal reflux disease without esophagitis: Secondary | ICD-10-CM | POA: Diagnosis present

## 2022-03-31 DIAGNOSIS — D372 Neoplasm of uncertain behavior of small intestine: Secondary | ICD-10-CM | POA: Diagnosis present

## 2022-03-31 DIAGNOSIS — K56609 Unspecified intestinal obstruction, unspecified as to partial versus complete obstruction: Principal | ICD-10-CM

## 2022-03-31 DIAGNOSIS — Z86010 Personal history of colonic polyps: Secondary | ICD-10-CM

## 2022-03-31 DIAGNOSIS — Z800 Family history of malignant neoplasm of digestive organs: Secondary | ICD-10-CM

## 2022-03-31 DIAGNOSIS — Z9013 Acquired absence of bilateral breasts and nipples: Secondary | ICD-10-CM

## 2022-03-31 DIAGNOSIS — Z853 Personal history of malignant neoplasm of breast: Secondary | ICD-10-CM

## 2022-03-31 DIAGNOSIS — E785 Hyperlipidemia, unspecified: Secondary | ICD-10-CM | POA: Diagnosis present

## 2022-03-31 DIAGNOSIS — Z808 Family history of malignant neoplasm of other organs or systems: Secondary | ICD-10-CM

## 2022-03-31 DIAGNOSIS — Z9882 Breast implant status: Secondary | ICD-10-CM

## 2022-03-31 DIAGNOSIS — Z79899 Other long term (current) drug therapy: Secondary | ICD-10-CM

## 2022-03-31 LAB — COMPREHENSIVE METABOLIC PANEL
ALT: 19 U/L (ref 0–44)
AST: 29 U/L (ref 15–41)
Albumin: 4.9 g/dL (ref 3.5–5.0)
Alkaline Phosphatase: 63 U/L (ref 38–126)
Anion gap: 13 (ref 5–15)
BUN: 17 mg/dL (ref 8–23)
CO2: 29 mmol/L (ref 22–32)
Calcium: 10.3 mg/dL (ref 8.9–10.3)
Chloride: 99 mmol/L (ref 98–111)
Creatinine, Ser: 0.89 mg/dL (ref 0.44–1.00)
GFR, Estimated: >60 mL/min (ref >60)
Glucose, Bld: 97 mg/dL (ref 70–99)
Potassium: 4 mmol/L (ref 3.5–5.1)
Sodium: 141 mmol/L (ref 135–145)
Total Bilirubin: 0.6 mg/dL (ref 0.3–1.2)
Total Protein: 7.9 g/dL (ref 6.5–8.1)

## 2022-03-31 LAB — CBC WITH DIFFERENTIAL/PLATELET
Abs Immature Granulocytes: 0.02 10*3/uL (ref 0.00–0.07)
Basophils Absolute: 0 10*3/uL (ref 0.0–0.1)
Basophils Relative: 1 %
Eosinophils Absolute: 0.1 10*3/uL (ref 0.0–0.5)
Eosinophils Relative: 1 %
HCT: 45.3 % (ref 36.0–46.0)
Hemoglobin: 15.4 g/dL — ABNORMAL HIGH (ref 12.0–15.0)
Immature Granulocytes: 0 %
Lymphocytes Relative: 27 %
Lymphs Abs: 2.1 10*3/uL (ref 0.7–4.0)
MCH: 32.2 pg (ref 26.0–34.0)
MCHC: 34 g/dL (ref 30.0–36.0)
MCV: 94.8 fL (ref 80.0–100.0)
Monocytes Absolute: 0.6 10*3/uL (ref 0.1–1.0)
Monocytes Relative: 8 %
Neutro Abs: 5 10*3/uL (ref 1.7–7.7)
Neutrophils Relative %: 63 %
Platelets: 294 10*3/uL (ref 150–400)
RBC: 4.78 MIL/uL (ref 3.87–5.11)
RDW: 11.8 % (ref 11.5–15.5)
WBC: 7.8 10*3/uL (ref 4.0–10.5)
nRBC: 0 % (ref 0.0–0.2)

## 2022-03-31 LAB — LACTIC ACID, PLASMA: Lactic Acid, Venous: 0.8 mmol/L (ref 0.5–1.9)

## 2022-03-31 LAB — LIPASE, BLOOD: Lipase: 24 U/L (ref 11–51)

## 2022-03-31 MED ORDER — SODIUM CHLORIDE 0.9 % IV SOLN: INTRAVENOUS | Status: DC

## 2022-03-31 MED ORDER — ONDANSETRON HCL 4 MG/2ML IJ SOLN
4.0000 mg | Freq: Once | INTRAMUSCULAR | Status: AC
  Administered 2022-03-31: 4 mg via INTRAVENOUS
  Filled 2022-03-31: qty 2

## 2022-03-31 MED ORDER — IOHEXOL 300 MG/ML  SOLN
100.0000 mL | Freq: Once | INTRAMUSCULAR | Status: AC | PRN
  Administered 2022-03-31: 80 mL via INTRAVENOUS

## 2022-03-31 MED ORDER — SODIUM CHLORIDE 0.9 % IV BOLUS
1000.0000 mL | Freq: Once | INTRAVENOUS | Status: AC
  Administered 2022-03-31: 1000 mL via INTRAVENOUS

## 2022-03-31 MED ORDER — FENTANYL CITRATE PF 50 MCG/ML IJ SOSY
50.0000 ug | PREFILLED_SYRINGE | Freq: Once | INTRAMUSCULAR | Status: AC
  Administered 2022-03-31: 50 ug via INTRAVENOUS
  Filled 2022-03-31: qty 1

## 2022-03-31 NOTE — ED Triage Notes (Signed)
POV from home, generalized abd pain that starts on the right side but is all of the abd. Sts this happened a couple months ago that was relieved with vomiting. Had CT and it was negative. F/u with GI next week. Feel nauseous currently but no vomiting/diarrhea. Normal BM per pt approx 1 hr ago. Amb to triage, GCS 15, A&o x 4.

## 2022-03-31 NOTE — Plan of Care (Signed)
   Patient Name: [name], [name] DOB: February 22, 1956 MRN: [id number] Transferring facility: [facility] Requesting provider: lawsing, md Reason for transfer: SBO 67 yo WF with small bowel obstruction. General surgery([facility]) consulted. no prior abd surgery.  NG tube being placed Going to: [facility] Admission Status: observation Bed Type: med/surg To Do: make sure general surgery sees the patient in the AM.  [facility] will assume care on arrival to accepting facility. Until arrival, medical decision making responsibilities remain with the EDP.  However, [facility] available 24/7 for questions and assistance.   Nursing staff please page [facility] and Consults [phone]) as soon as the patient arrives to the hospital.  Kristopher Oppenheim, DO [facility]

## 2022-03-31 NOTE — ED Provider Notes (Signed)
[facility] Provider Note   CSN: [id number] Arrival date & time: 03/31/22  1957     History  Chief Complaint  Patient presents with   Abdominal Pain    [name] is a 67 y.o. female.   Abdominal Pain Associated symptoms: nausea and vomiting      68 year old female presenting to the emergency department with epigastric and right upper quadrant abdominal pain that has been ongoing and intermittent for the past week.  She states that she has had 3 episodes of this over the past year.  She endorses nausea, associated NBNB emesis with 2 episodes over the past week.  Her symptoms are somewhat relieved by vomiting.  She endorses epigastric and right upper quadrant discomfort, no radiation.  She last had a bowel movement yesterday.  She is not currently passing gas today.  She denies any diarrhea.  She denies any fevers, does endorse chills here in the emergency department.  She denies any dysuria or increased urinary frequency.  Home Medications Prior to Admission medications   Medication Sig Start Date End Date Taking? Authorizing Provider  calcium carbonate (OS-CAL - DOSED IN MG OF ELEMENTAL CALCIUM) 1250 (500 Ca) MG tablet Take 1 tablet by mouth 2 (two) times daily with a meal.    [provider]  Cholecalciferol (VITAMIN D3) 125 MCG (5000 UT) CAPS Take 5,000 Units by mouth once a week.    [provider]  cycloSPORINE (RESTASIS) 0.05 % ophthalmic emulsion Place 1 drop into both eyes 2 (two) times daily.    [provider]  famotidine (PEPCID) 40 MG tablet Take 40 mg by mouth daily as needed for heartburn or indigestion.    [provider]  melatonin 5 MG TABS Take 10 mg by mouth at bedtime.    [provider]  Multiple Vitamin (MULTI-VITAMIN DAILY PO) Take 1 tablet by mouth daily.    [provider]  Omega-3 Fatty Acids (FISH OIL) 1000 MG CAPS Take 1,000 mg by mouth 2 (two)  times daily. 01/09/16   [provider]  rosuvastatin (CRESTOR) 10 MG tablet Take 10 mg by mouth at bedtime. 02/19/21   [provider]      Allergies    Patient has no known allergies.    Review of Systems   Review of Systems  Gastrointestinal:  Positive for abdominal pain, nausea and vomiting.  All other systems reviewed and are negative.   Physical Exam Updated Vital Signs BP 94/64   Pulse 75   Temp 97.8 F (36.6 C) (Oral)   Resp 18   Ht '5\' 7"'$  (1.702 m)   Wt 67.1 kg   SpO2 100%   BMI 23.18 kg/m  Physical Exam Vitals and nursing note reviewed.  Constitutional:      General: She is not in acute distress.    Appearance: She is well-developed.  HENT:     Head: Normocephalic and atraumatic.  Eyes:     Conjunctiva/sclera: Conjunctivae normal.  Cardiovascular:     Rate and Rhythm: Normal rate and regular rhythm.  Pulmonary:     Effort: Pulmonary effort is normal. No respiratory distress.     Breath sounds: Normal breath sounds.  Abdominal:     Palpations: Abdomen is soft.     Tenderness: There is generalized abdominal tenderness and tenderness in the right upper quadrant and epigastric area. There is no guarding or rebound.  Musculoskeletal:        General:  No swelling.     Cervical back: Neck supple.  Skin:    General: Skin is warm and dry.     Capillary Refill: Capillary refill takes less than 2 seconds.  Neurological:     Mental Status: She is alert.  Psychiatric:        Mood and Affect: Mood normal.     ED Results / Procedures / Treatments   Labs (all labs ordered are listed, but only abnormal results are displayed) Labs Reviewed  CBC WITH DIFFERENTIAL/PLATELET - Abnormal; Notable for the following components:      Result Value   Hemoglobin 15.4 (*)    All other components within normal limits  COMPREHENSIVE METABOLIC PANEL  LIPASE, BLOOD  LACTIC ACID, PLASMA  URINALYSIS, ROUTINE W REFLEX MICROSCOPIC    EKG None  Radiology CT  ABDOMEN PELVIS W CONTRAST  Result Date: 03/31/2022 CLINICAL DATA:  Generalized abdominal pain that started on the right side but is all of the abdomen. This happened a few months ago and was relieved with vomiting. EXAM: CT ABDOMEN AND PELVIS WITH CONTRAST TECHNIQUE: Multidetector CT imaging of the abdomen and pelvis was performed using the standard protocol following bolus administration of intravenous contrast. RADIATION DOSE REDUCTION: This exam was performed according to the departmental dose-optimization program which includes automated exposure control, adjustment of the mA and/or kV according to patient size and/or use of iterative reconstruction technique. CONTRAST:  58m OMNIPAQUE IOHEXOL 300 MG/ML  SOLN COMPARISON:  CT enterography 12/11/2021 FINDINGS: Lower chest: No acute abnormality. Hepatobiliary: No focal liver abnormality is seen. No gallstones, gallbladder wall thickening, or biliary dilatation. Pancreas: Unremarkable. No pancreatic ductal dilatation or surrounding inflammatory changes. Spleen: Normal in size without focal abnormality. Adrenals/Urinary Tract: Adrenal glands are unremarkable. Kidneys are normal, without renal calculi, focal lesion, or hydronephrosis. Bladder is unremarkable. Stomach/Bowel: Normal caliber colon. Unremarkable stomach. Normal appendix. Dilated small bowel with abrupt transition point in the central mesentery (series 5/image 56. The small bowel downstream from the transition point is decompressed. There is fecalization of bowel contents within the dilated portion of small bowel. Question mild thickening of the small bowel wall at the site of obstruction as appreciated on sagittal images (series 6/image 68-72). Vascular/Lymphatic: No significant vascular findings are present. No enlarged abdominal or pelvic lymph nodes. Reproductive: Uterus and bilateral adnexa are unremarkable. Other: Small amount of free fluid in the pelvis. No free intraperitoneal air.  Musculoskeletal: No acute osseous abnormality. IMPRESSION: High-grade small bowel obstruction with transition point in the central abdomen. The small bowel wall is thickened at the site of obstruction. This may be due to infection/inflammation however underlying neoplasm is not excluded. Electronically Signed   By: TPlacido SouM.D.   On: 03/31/2022 23:17    Procedures Procedures    Medications Ordered in ED Medications  sodium chloride 0.9 % bolus 1,000 mL (1,000 mLs Intravenous New Bag/Given 03/31/22 2247)    And  0.9 %  sodium chloride infusion (has no administration in time range)  ondansetron (ZOFRAN) injection 4 mg (4 mg Intravenous Given 03/31/22 2240)  fentaNYL (SUBLIMAZE) injection 50 mcg (50 mcg Intravenous Given 03/31/22 2239)  iohexol (OMNIPAQUE) 300 MG/ML solution 100 mL (80 mLs Intravenous Contrast Given 03/31/22 2254)    ED Course/ Medical Decision Making/ A&P                             Medical Decision Making Amount and/or Complexity of Data Reviewed Labs: ordered.  Radiology: ordered.  Risk Prescription drug management. Decision regarding hospitalization.   67 year old female presenting to the emergency department with epigastric and right upper quadrant abdominal pain that has been ongoing and intermittent for the past week.  She states that she has had 3 episodes of this over the past year.  She endorses nausea, associated NBNB emesis with 2 episodes over the past week.  Her symptoms are somewhat relieved by vomiting.  She endorses epigastric and right upper quadrant discomfort, no radiation.  She last had a bowel movement yesterday.  She is not currently passing gas today.  She denies any diarrhea.  She denies any fevers, does endorse chills here in the emergency department.  She denies any dysuria or increased urinary frequency.  On arrival, the patient was afebrile, not tachycardic or tachypneic, normotensive, saturating 97% on room air.  Sinus rhythm noted on  cardiac telemetry.  Physical exam significant for epigastric and right upper quadrant tenderness to palpation with some generalized tenderness.  Differential diagnosis includes cholelithiasis/cholecystitis, pancreatitis, small bowel obstruction, abdomen or intra-abdominal abnormality such as diverticulitis, appendicitis with perforation, nephrolithiasis.  Laboratory evaluation significant for CMP without acute abnormality, CBC without leukocytosis, evidence of mild hemoconcentration with a hemoglobin of 15.4, no significant elevation in lipase, lactic acid pending, urinalysis ordered and pending.  She was administered an IV fluid bolus, IV fentanyl and Zofran for pain control and nausea control.  CT imaging revealed a high-grade small bowel obstruction.  The patient was informed of these results.  General surgery was consulted and hospitalist medicine was consulted for admission.  NG tube was ordered for placement. IMPRESSION:  High-grade small bowel obstruction with transition point in the  central abdomen. The small bowel wall is thickened at the site of  obstruction. This may be due to infection/inflammation however  underlying neoplasm is not excluded.   Spoke with Dr. Michaelle Birks of general surgery who will see the patient in consultation in the hospital. Lactic acid resulted normal. Hospitalist medicine consulted for admission.  Final Clinical Impression(s) / ED Diagnoses Final diagnoses:  SBO (small bowel obstruction) ([place])    Rx / DC Orders ED Discharge Orders     None         Regan Lemming, MD 03/31/22 2346

## 2022-04-01 DIAGNOSIS — Z79890 Hormone replacement therapy (postmenopausal): Secondary | ICD-10-CM | POA: Diagnosis not present

## 2022-04-01 DIAGNOSIS — Z9882 Breast implant status: Secondary | ICD-10-CM | POA: Diagnosis not present

## 2022-04-01 DIAGNOSIS — Z800 Family history of malignant neoplasm of digestive organs: Secondary | ICD-10-CM | POA: Diagnosis not present

## 2022-04-01 DIAGNOSIS — Z801 Family history of malignant neoplasm of trachea, bronchus and lung: Secondary | ICD-10-CM | POA: Diagnosis not present

## 2022-04-01 DIAGNOSIS — Z79899 Other long term (current) drug therapy: Secondary | ICD-10-CM | POA: Diagnosis not present

## 2022-04-01 DIAGNOSIS — K219 Gastro-esophageal reflux disease without esophagitis: Secondary | ICD-10-CM | POA: Diagnosis present

## 2022-04-01 DIAGNOSIS — Z8261 Family history of arthritis: Secondary | ICD-10-CM | POA: Diagnosis not present

## 2022-04-01 DIAGNOSIS — Z86010 Personal history of colonic polyps: Secondary | ICD-10-CM | POA: Diagnosis not present

## 2022-04-01 DIAGNOSIS — Z808 Family history of malignant neoplasm of other organs or systems: Secondary | ICD-10-CM | POA: Diagnosis not present

## 2022-04-01 DIAGNOSIS — Z83438 Family history of other disorder of lipoprotein metabolism and other lipidemia: Secondary | ICD-10-CM | POA: Diagnosis not present

## 2022-04-01 DIAGNOSIS — Z803 Family history of malignant neoplasm of breast: Secondary | ICD-10-CM | POA: Diagnosis not present

## 2022-04-01 DIAGNOSIS — E785 Hyperlipidemia, unspecified: Secondary | ICD-10-CM | POA: Diagnosis present

## 2022-04-01 DIAGNOSIS — K56609 Unspecified intestinal obstruction, unspecified as to partial versus complete obstruction: Secondary | ICD-10-CM | POA: Diagnosis not present

## 2022-04-01 DIAGNOSIS — Z9013 Acquired absence of bilateral breasts and nipples: Secondary | ICD-10-CM | POA: Diagnosis not present

## 2022-04-01 DIAGNOSIS — Z853 Personal history of malignant neoplasm of breast: Secondary | ICD-10-CM | POA: Diagnosis not present

## 2022-04-01 DIAGNOSIS — D372 Neoplasm of uncertain behavior of small intestine: Secondary | ICD-10-CM | POA: Diagnosis present

## 2022-04-01 LAB — CBC
HCT: 34.6 % — ABNORMAL LOW (ref 36.0–46.0)
Hemoglobin: 10.9 g/dL — ABNORMAL LOW (ref 12.0–15.0)
MCH: 32.2 pg (ref 26.0–34.0)
MCHC: 31.5 g/dL (ref 30.0–36.0)
MCV: 102.1 fL — ABNORMAL HIGH (ref 80.0–100.0)
Platelets: 206 10*3/uL (ref 150–400)
RBC: 3.39 MIL/uL — ABNORMAL LOW (ref 3.87–5.11)
RDW: 11.9 % (ref 11.5–15.5)
WBC: 5.3 10*3/uL (ref 4.0–10.5)
nRBC: 0 % (ref 0.0–0.2)

## 2022-04-01 LAB — BASIC METABOLIC PANEL
Anion gap: 10 (ref 5–15)
BUN: 14 mg/dL (ref 8–23)
CO2: 19 mmol/L — ABNORMAL LOW (ref 22–32)
Calcium: 8.1 mg/dL — ABNORMAL LOW (ref 8.9–10.3)
Chloride: 110 mmol/L (ref 98–111)
Creatinine, Ser: 0.67 mg/dL (ref 0.44–1.00)
GFR, Estimated: >60 mL/min (ref >60)
Glucose, Bld: 101 mg/dL — ABNORMAL HIGH (ref 70–99)
Potassium: 4.1 mmol/L (ref 3.5–5.1)
Sodium: 139 mmol/L (ref 135–145)

## 2022-04-01 LAB — URINALYSIS, ROUTINE W REFLEX MICROSCOPIC
Bacteria, UA: NONE SEEN
Bilirubin Urine: NEGATIVE
Glucose, UA: NEGATIVE mg/dL
Hgb urine dipstick: NEGATIVE
Ketones, ur: 20 mg/dL — AB
Nitrite: NEGATIVE
Protein, ur: NEGATIVE mg/dL
Specific Gravity, Urine: 1.031 — ABNORMAL HIGH (ref 1.005–1.030)
pH: 7 (ref 5.0–8.0)

## 2022-04-01 LAB — HIV ANTIBODY (ROUTINE TESTING W REFLEX): HIV Screen 4th Generation wRfx: NONREACTIVE

## 2022-04-01 MED ORDER — ACETAMINOPHEN 325 MG PO TABS
650.0000 mg | ORAL_TABLET | Freq: Four times a day (QID) | ORAL | Status: DC | PRN
  Administered 2022-04-01 – 2022-04-02 (×2): 650 mg via ORAL
  Filled 2022-04-01 (×2): qty 2

## 2022-04-01 MED ORDER — MORPHINE SULFATE (PF) 2 MG/ML IV SOLN
2.0000 mg | INTRAVENOUS | Status: DC | PRN
  Filled 2022-04-01: qty 1

## 2022-04-01 MED ORDER — ACETAMINOPHEN 650 MG RE SUPP: 650.0000 mg | Freq: Four times a day (QID) | RECTAL | Status: DC | PRN

## 2022-04-01 MED ORDER — NALOXONE HCL 0.4 MG/ML IJ SOLN: 0.4000 mg | INTRAMUSCULAR | Status: DC | PRN

## 2022-04-01 MED ORDER — PHENOL 1.4 % MT LIQD
1.0000 | OROMUCOSAL | Status: DC | PRN
  Filled 2022-04-01: qty 177

## 2022-04-01 MED ORDER — MORPHINE SULFATE (PF) 2 MG/ML IV SOLN: 1.0000 mg | INTRAVENOUS | Status: DC | PRN

## 2022-04-01 NOTE — ED Notes (Signed)
Pt placed in a hospital gown.

## 2022-04-01 NOTE — Progress Notes (Signed)
Brief same day note:  Patient is a 67 year old female with history of hyperlipidemia, breast cancer s/p bilateral mastectomies, GERD who presented with abdomen pain, nausea, vomiting.  CT abdomen/pelvis with contrast showed high-grade small bowel obstruction with transition point in the central abdomen, small bowel thickening at the site of obstruction, suspected infection/inflammation with underlying neoplasm not excluded.  Patient was given IV fluids, started on pain medication, general surgery consulted,  NG tube placed.  Patient seen and examined at bedside.  Remains comfortable.  Denies any abdomen pain.  NG tube was draining bilious fluid.  She says she is passing some gas, bowel sounds were present.  Abdomen soft, nontender, nondistended.  Assessment and plan:  High-grade small bowel obstruction:CT abdomen/pelvis with contrast showed high-grade small bowel obstruction with transition point in the central abdomen, small bowel thickening at the site of obstruction, suspected infection/inflammation with underlying neoplasm not excluded.  Patient was given IV fluids, started on pain medication, general surgery consulted,  NG tube placed. No history of prior abdominal surgeries.  History of colon cancer in family.  Infectious processes less likely due to absence of fever, leukocytosis or lactase disease. She was following up with gastroenterology, Dr. Collene Mares, also with [facility] gastroenterology Current plan is to continue conservative management with follow-up with GI at [facility] after resolution of SBO versus diagnostic laparoscopy during this hospitalization due to finding of small bowel thickening Keep n.p.o.  Hyperlipidemia: On statin at home.  History of breast cancer: Status post bilateral mastectomies

## 2022-04-01 NOTE — Consult Note (Signed)
[name] 05-31-1955  [id number].    Requesting MD: Dr. Shela Leff Chief Complaint/Reason for Consult: SBO  HPI: [name] is a 67 y.o. female who presented to the ED for abdominal pain.  Patient reports she has been having intermittent episodes of abdominal pain, nausea and vomiting since November 2022.  The episodes are usually a cramping/viselike pain throughout her upper abdomen with associated nausea and vomiting.  Symptoms usually self resolve after episodes of vomiting.  She has seen Dr. Collene Mares for this.  Reports hx of endoscopy and colonoscopy in 2023 that were completely negative.  She had a capsule endoscopy Oct 2023 that showed multiple areas of inflammation and edema of the small bowel with difficulty of the capsule endoscopy passing through certain regions.  CTE 12/09/2021 showed no clear correlation to the areas of inflammation in the small bowel on recent capsule endoscopy study.  There were collapsed loops of small bowel in the pelvis without wall thickening, mucosal hyperenhancement or surrounding inflammation.  Patient notes since November she has been having increasing bouts of these episodes that will self resolve. Since November her bm's frequency and consistency has been off. Her last episode of pain started yesterday and was similar to symptoms as stated above. Last episode of vomiting was in the ED. Workup showed a high-grade small bowel obstruction with transition point in the central abdomen with small bowel thickened at the site of obstruction.  She currently has a NG tube in place.  Scant thin output in canister.  She denies any current abdominal pain, nausea and has been able to pass flatus this morning. Last bm was last night around ~530 after symptom onsent.  Feels symptoms have resolved.  No prior abdominal surgeries. Reports family history of colon cancer.  No family history of IBD. No tobacco use. Not on anticoagulation. Reports she was  referred to GI specialist at [facility] for what sounds like a double balloon endoscopy - appointment scheduled for next Thursday, 2/8.   ROS: ROS As above, see hpi  Family History  Problem Relation Age of Onset   Arthritis Mother    Colon cancer Mother 66   Melanoma Father        dx 68s; nose   Lung cancer Paternal Uncle        dx after 71; smoking hx   Breast cancer Maternal Grandmother        dx 67s   Hyperlipidemia Paternal Grandmother    Cancer Paternal Grandfather        unknown type; d. before 29   Colon cancer Other        MGM's mother    Past Medical History:  Diagnosis Date   Arthritis    Backache, unspecified    Cancer ([place])    breast s/p BL mastectomies   Colon polyps    Family history of breast cancer 10/27/2021   Family history of colon cancer 10/27/2021   Hyperlipidemia    PONV (postoperative nausea and vomiting)     Past Surgical History:  Procedure Laterality Date   BREAST RECONSTRUCTION WITH PLACEMENT OF TISSUE EXPANDER AND FLEX HD (ACELLULAR HYDRATED DERMIS) Bilateral 04/05/2019   Procedure: BILATERAL BREAST RECONSTRUCTION WITH PLACEMENT OF TISSUE EXPANDER AND FLEX HD (ACELLULAR HYDRATED DERMIS);  Surgeon: Wallace Going, DO;  Location: [place];  Service: Plastics;  Laterality: Bilateral;   ENDOMETRIAL ABLATION  1998   GIVENS CAPSULE STUDY N/A 12/04/2021   Procedure: GIVENS CAPSULE  STUDY;  Surgeon: Juanita Craver, MD;  Location: [facility];  Service: Gastroenterology;  Laterality: N/A;   KNEE SURGERY Right 2007   LIPOSUCTION WITH LIPOFILLING Bilateral 01/30/2020   Procedure: LIPOSUCTION WITH LIPOFILLING;  Surgeon: Wallace Going, DO;  Location: [facility];  Service: Plastics;  Laterality: Bilateral;  90 min, please   MASTECTOMY W/ SENTINEL NODE BIOPSY Bilateral 04/05/2019   Procedure: BILATERAL MASTECTOMIES WITH RIGHT SENTINEL LYMPH NODE BIOPSY;  Surgeon: [name], MD;  Location: [facility];   Service: General;  Laterality: Bilateral;   REMOVAL OF BILATERAL TISSUE EXPANDERS WITH PLACEMENT OF BILATERAL BREAST IMPLANTS Bilateral 09/26/2019   Procedure: REMOVAL OF BILATERAL TISSUE EXPANDERS WITH PLACEMENT OF BILATERAL BREAST IMPLANTS;  Surgeon: Wallace Going, DO;  Location: [place];  Service: Plastics;  Laterality: Bilateral;   [place]    Social History:  reports that she has never smoked. She has never used smokeless tobacco. She reports current alcohol use. She reports that she does not use drugs. Occasional alcohol use.   Allergies: No Known Allergies  Medications Prior to Admission  Medication Sig Dispense Refill   calcium carbonate (OS-CAL - DOSED IN MG OF ELEMENTAL CALCIUM) 1250 (500 Ca) MG tablet Take 1 tablet by mouth 2 (two) times daily with a meal.     Cholecalciferol (VITAMIN D3) 125 MCG (5000 UT) CAPS Take 5,000 Units by mouth once a week.     cycloSPORINE (RESTASIS) 0.05 % ophthalmic emulsion Place 1 drop into both eyes 2 (two) times daily.     famotidine (PEPCID) 40 MG tablet Take 40 mg by mouth daily as needed for heartburn or indigestion.     melatonin 5 MG TABS Take 10 mg by mouth at bedtime.     Multiple Vitamin (MULTI-VITAMIN DAILY PO) Take 1 tablet by mouth daily.     Omega-3 Fatty Acids (FISH OIL) 1000 MG CAPS Take 1,000 mg by mouth 2 (two) times daily.     rosuvastatin (CRESTOR) 10 MG tablet Take 10 mg by mouth at bedtime.       Physical Exam: Blood pressure 109/76, pulse 79, temperature 98 F (36.7 C), resp. rate 16, height '5\' 7"'$  (1.702 m), weight 67.1 kg, SpO2 98 %. General: pleasant, WD/WN female who is laying in bed in NAD HEENT: head is normocephalic, atraumatic.  Sclera are noninjected.  PERRL.  Ears and nose without any masses or lesions.  Mouth is pink and moist. Dentition fair Heart: regular, rate, and rhythm.  Palpable pedal pulses bilaterally  Lungs: CTAB, no wheezes, rhonchi, or rales  noted.  Respiratory effort nonlabored Abd:  Soft, mild distension, NT, +BS. No masses, hernias, or organomegaly. NGT in place with thin output that is red tinged from chloraseptic spray.  MS: no BUE or BLE edema, calves soft and nontender Skin: warm and dry  Psych: A&Ox4 with an appropriate affect Neuro: cranial nerves grossly intact, normal speech, thought process intact, moves all extremities, gait not assessed    Results for orders placed or performed during the hospital encounter of 03/31/22 (from the past 48 hour(s))  Comprehensive metabolic panel     Status: None   Collection Time: 03/31/22  8:18 PM  Result Value Ref Range   Sodium 141 135 - 145 mmol/L   Potassium 4.0 3.5 - 5.1 mmol/L   Chloride 99 98 - 111 mmol/L   CO2 29 22 - 32 mmol/L   Glucose, Bld 97 70 -  99 mg/dL    Comment: Glucose reference range applies only to samples taken after fasting for at least 8 hours.   BUN 17 8 - 23 mg/dL   Creatinine, Ser 0.89 0.44 - 1.00 mg/dL   Calcium 10.3 8.9 - 10.3 mg/dL   Total Protein 7.9 6.5 - 8.1 g/dL   Albumin 4.9 3.5 - 5.0 g/dL   AST 29 15 - 41 U/L   ALT 19 0 - 44 U/L   Alkaline Phosphatase 63 38 - 126 U/L   Total Bilirubin 0.6 0.3 - 1.2 mg/dL   GFR, Estimated >60 >60 mL/min    Comment: (NOTE) Calculated using the CKD-EPI Creatinine Equation (2021)    Anion gap 13 5 - 15    Comment: Performed at [facility], [address] [place], [place] [address]  Lipase, blood     Status: None   Collection Time: 03/31/22  8:18 PM  Result Value Ref Range   Lipase 24 11 - 51 U/L    Comment: Performed at [facility], [address] [place], [place] [address]  CBC with Diff     Status: Abnormal   Collection Time: 03/31/22  8:18 PM  Result Value Ref Range   WBC 7.8 4.0 - 10.5 K/uL   RBC 4.78 3.87 - 5.11 MIL/uL   Hemoglobin 15.4 (H) 12.0 - 15.0 g/dL   HCT 45.3 36.0 - 46.0 %   MCV 94.8 80.0 - 100.0 fL   MCH 32.2 26.0 - 34.0 pg   MCHC 34.0  30.0 - 36.0 g/dL   RDW 11.8 11.5 - 15.5 %   Platelets 294 150 - 400 K/uL   nRBC 0.0 0.0 - 0.2 %   Neutrophils Relative % 63 %   Neutro Abs 5.0 1.7 - 7.7 K/uL   Lymphocytes Relative 27 %   Lymphs Abs 2.1 0.7 - 4.0 K/uL   Monocytes Relative 8 %   Monocytes Absolute 0.6 0.1 - 1.0 K/uL   Eosinophils Relative 1 %   Eosinophils Absolute 0.1 0.0 - 0.5 K/uL   Basophils Relative 1 %   Basophils Absolute 0.0 0.0 - 0.1 K/uL   Immature Granulocytes 0 %   Abs Immature Granulocytes 0.02 0.00 - 0.07 K/uL    Comment: Performed at [facility], [place], [place] [address]  Lactic acid, plasma     Status: None   Collection Time: 03/31/22 10:45 PM  Result Value Ref Range   Lactic Acid, Venous 0.8 0.5 - 1.9 mmol/L    Comment: Performed at [facility], [place],  [id number]  Urinalysis, Routine w reflex microscopic -Urine, Clean Catch     Status: Abnormal   Collection Time: 04/01/22  2:17 AM  Result Value Ref Range   Color, Urine STRAW (A) YELLOW   APPearance CLEAR CLEAR   Specific Gravity, Urine 1.031 (H) 1.005 - 1.030   pH 7.0 5.0 - 8.0   Glucose, UA NEGATIVE NEGATIVE mg/dL   Hgb urine dipstick NEGATIVE NEGATIVE   Bilirubin Urine NEGATIVE NEGATIVE   Ketones, ur 20 (A) NEGATIVE mg/dL   Protein, ur NEGATIVE NEGATIVE mg/dL   Nitrite NEGATIVE NEGATIVE   Leukocytes,Ua TRACE (A) NEGATIVE   RBC / HPF 0-5 0 - 5 RBC/hpf   WBC, UA 0-5 0 - 5 WBC/hpf   Bacteria, UA NONE SEEN NONE SEEN   Squamous Epithelial / HPF 0-5 0 - 5 /HPF   Mucus PRESENT     Comment: Performed at  [facility], [place] [address] [place], [place] [address]  CBC     Status: Abnormal   Collection Time: 04/01/22  4:42 AM  Result Value Ref Range   WBC 5.3 4.0 - 10.5 K/uL   RBC 3.39 (L) 3.87 - 5.11 MIL/uL   Hemoglobin 10.9 (L) 12.0 - 15.0 g/dL   HCT 34.6 (L) 36.0 - 46.0 %   MCV 102.1 (H) 80.0 - 100.0 fL   MCH 32.2 26.0 - 34.0 pg   MCHC  31.5 30.0 - 36.0 g/dL   RDW 11.9 11.5 - 15.5 %   Platelets 206 150 - 400 K/uL   nRBC 0.0 0.0 - 0.2 %    Comment: Performed at [facility], [place] [address], [place], [place] [address]  Basic metabolic panel     Status: Abnormal   Collection Time: 04/01/22  4:42 AM  Result Value Ref Range   Sodium 139 135 - 145 mmol/L   Potassium 4.1 3.5 - 5.1 mmol/L   Chloride 110 98 - 111 mmol/L   CO2 19 (L) 22 - 32 mmol/L   Glucose, Bld 101 (H) 70 - 99 mg/dL    Comment: Glucose reference range applies only to samples taken after fasting for at least 8 hours.   BUN 14 8 - 23 mg/dL   Creatinine, Ser 0.67 0.44 - 1.00 mg/dL   Calcium 8.1 (L) 8.9 - 10.3 mg/dL   GFR, Estimated >60 >60 mL/min    Comment: (NOTE) Calculated using the CKD-EPI Creatinine Equation (2021)    Anion gap 10 5 - 15    Comment: Performed at [facility], [place] [address], [place], [place] [address]   DG Abdomen 1 View  Result Date: 04/01/2022 CLINICAL DATA:  NG tube placement EXAM: ABDOMEN - 1 VIEW COMPARISON:  CT abdomen and pelvis 03/31/2022 FINDINGS: Enteric tube tip and side-port in the stomach. Nonspecific bowel-gas pattern. Surgical clips right chest wall. The lungs are clear. IMPRESSION: Enteric tube tip and side port in the stomach. Electronically Signed   By: Placido Sou M.D.   On: 04/01/2022 00:09   CT ABDOMEN PELVIS W CONTRAST  Result Date: 03/31/2022 CLINICAL DATA:  Generalized abdominal pain that started on the right side but is all of the abdomen. This happened a few months ago and was relieved with vomiting. EXAM: CT ABDOMEN AND PELVIS WITH CONTRAST TECHNIQUE: Multidetector CT imaging of the abdomen and pelvis was performed using the standard protocol following bolus administration of intravenous contrast. RADIATION DOSE REDUCTION: This exam was performed according to the departmental dose-optimization program which includes automated exposure control, adjustment of the mA and/or kV  according to patient size and/or use of iterative reconstruction technique. CONTRAST:  93m OMNIPAQUE IOHEXOL 300 MG/ML  SOLN COMPARISON:  CT enterography 12/11/2021 FINDINGS: Lower chest: No acute abnormality. Hepatobiliary: No focal liver abnormality is seen. No gallstones, gallbladder wall thickening, or biliary dilatation. Pancreas: Unremarkable. No pancreatic ductal dilatation or surrounding inflammatory changes. Spleen: Normal in size without focal abnormality. Adrenals/Urinary Tract: Adrenal glands are unremarkable. Kidneys are normal, without renal calculi, focal lesion, or hydronephrosis. Bladder is unremarkable. Stomach/Bowel: Normal caliber colon. Unremarkable stomach. Normal appendix. Dilated small bowel with abrupt transition point in the central mesentery (series 5/image 56. The small bowel downstream from the transition point is decompressed. There is fecalization of bowel contents within the dilated portion of small bowel. Question mild thickening of the small bowel wall at the site of obstruction as appreciated on sagittal images (  series 6/image [id number]). Vascular/Lymphatic: No significant vascular findings are present. No enlarged abdominal or pelvic lymph nodes. Reproductive: Uterus and bilateral adnexa are unremarkable. Other: Small amount of free fluid in the pelvis. No free intraperitoneal air. Musculoskeletal: No acute osseous abnormality. IMPRESSION: High-grade small bowel obstruction with transition point in the central abdomen. The small bowel wall is thickened at the site of obstruction. This may be due to infection/inflammation however underlying neoplasm is not excluded. Electronically Signed   By: Placido Sou M.D.   On: 03/31/2022 23:17    Anti-infectives (From admission, onward)    None       Assessment/Plan SBO Patient has been seen and examined.  Vitals, labs, I/O, imaging and available notes reviewed.  This is a 67 year old female who presents with intermittent episodes  of abdominal pain, nausea and vomiting since November 2022.  She has had increased episodes over the last several weeks/months. No hx of abdominal surgeries. She has had thorough workup as listed in HPI by Dr. Collene Mares of GI (endoscopy, colonoscopy, capsule endoscopy, CTE).  She has an appointment scheduled for next Thursday, 2/8, with a GI specialist at [facility] for what sounds like is a double-balloon endoscopy.  Her workup in the ED yesterday showed high-grade small bowel obstruction with transition point in the central abdomen with small bowel thickened at the site of obstruction.  Clinically she appears to be improving.  She has resolution of her symptoms.  VSS without fever, tachycardia or hypertension.  No leukocytosis.  Lactic within normal limit.  Abdomen is soft and NT.  NG tube with thin output.  She has ROBF with + flatus.  We will discuss with Dr. Collene Mares about options moving forward.  Given the frequency of her recurrent symptoms could offer diagnostic laparoscopy to evaluate etiology of this, however given resolution of her symptoms would favor avoiding surgery during admission and getting her follow-up with [facility] for double-balloon endoscopy.  Further recommendations to follow.  FEN - NPO, NGT to LIWS VTE - SCDs, okay for chem ppx from a general surgery standpoint ID - None  I reviewed nursing notes, ED provider notes, hospitalist notes, last 24 h vitals and pain scores, last 48 h intake and output, last 24 h labs and trends, and last 24 h imaging results.  Jillyn Ledger, [facility] 04/01/2022, 8:28 AM Please see Amion for pager number during day hours 7:00am-4:30pm

## 2022-04-01 NOTE — H&P (Signed)
History and Physical    [name] GYI:[id number] DOB: [birth year] DOA: 03/31/2022  PCP: Pcp, No  Patient coming from: [place] ED  Chief Complaint: Abdominal pain  HPI: [name] is a 67 y.o. female with medical history significant of hyperlipidemia, breast cancer status post bilateral mastectomies, GERD presented to the ED with abdominal pain, nausea, and vomiting.  Vital signs stable.  Labs showing no leukocytosis, hemoglobin 15.4, lipase and LFTs normal, lactic acid normal, UA pending.    CT abdomen pelvis with contrast showing:  "IMPRESSION: High-grade small bowel obstruction with transition point in the central abdomen. The small bowel wall is thickened at the site of obstruction. This may be due to infection/inflammation however underlying neoplasm is not excluded."  Patient was given fentanyl, Zofran, and 1 L normal saline bolus.  General surgery consulted by ED physician (Dr. Zenia Resides) and NG tube placed.  Patient states she has had abdominal pain on and off for the past year.  She describes it as generalized abdominal pain but mostly in the epigastric region and right upper quadrant.  In October she had another episode of abdominal pain which improved after she vomited.  This happened again a week ago and then tonight.  Each time her pain improves after vomiting.  She vomited twice tonight.  Her last bowel movement was yesterday before coming into the ED, loose stool.  Patient denies history of prior abdominal surgeries.  Does report family history of colon cancer.  She has no other complaints.  Denies fevers, cough, shortness of breath, or chest pain.  Review of Systems:  Review of Systems  All other systems reviewed and are negative.   Past Medical History:  Diagnosis Date   Arthritis    Backache, unspecified    Cancer ([place])    breast s/p BL mastectomies   Colon polyps    Family history of breast cancer 10/27/2021   Family history of colon cancer  10/27/2021   Hyperlipidemia    PONV (postoperative nausea and vomiting)     Past Surgical History:  Procedure Laterality Date   BREAST RECONSTRUCTION WITH PLACEMENT OF TISSUE EXPANDER AND FLEX HD (ACELLULAR HYDRATED DERMIS) Bilateral 04/05/2019   Procedure: BILATERAL BREAST RECONSTRUCTION WITH PLACEMENT OF TISSUE EXPANDER AND FLEX HD (ACELLULAR HYDRATED DERMIS);  Surgeon: Wallace Going, DO;  Location: [facility];  Service: Plastics;  Laterality: Bilateral;   ENDOMETRIAL ABLATION  1998   GIVENS CAPSULE STUDY N/A 12/04/2021   Procedure: GIVENS CAPSULE STUDY;  Surgeon: Juanita Craver, MD;  Location: [facility];  Service: Gastroenterology;  Laterality: N/A;   KNEE SURGERY Right 2007   LIPOSUCTION WITH LIPOFILLING Bilateral 01/30/2020   Procedure: LIPOSUCTION WITH LIPOFILLING;  Surgeon: Wallace Going, DO;  Location: [facility];  Service: Plastics;  Laterality: Bilateral;  90 min, please   MASTECTOMY W/ SENTINEL NODE BIOPSY Bilateral 04/05/2019   Procedure: BILATERAL MASTECTOMIES WITH RIGHT SENTINEL LYMPH NODE BIOPSY;  Surgeon: [name], MD;  Location: [facility];  Service: General;  Laterality: Bilateral;   REMOVAL OF BILATERAL TISSUE EXPANDERS WITH PLACEMENT OF BILATERAL BREAST IMPLANTS Bilateral 09/26/2019   Procedure: REMOVAL OF BILATERAL TISSUE EXPANDERS WITH PLACEMENT OF BILATERAL BREAST IMPLANTS;  Surgeon: Wallace Going, DO;  Location: [facility];  Service: Plastics;  Laterality: Bilateral;   [place]     reports that she has never smoked. She has never used smokeless tobacco. She reports current  alcohol use. She reports that she does not use drugs.  No Known Allergies  Family History  Problem Relation Age of Onset   Arthritis Mother    Colon cancer Mother 54   Melanoma Father        dx 22s; nose   Lung cancer Paternal Uncle        dx after 23; smoking hx   Breast cancer  Maternal Grandmother        dx 65s   Hyperlipidemia Paternal Grandmother    Cancer Paternal Grandfather        unknown type; d. before 7   Colon cancer Other        MGM's mother    Prior to Admission medications   Medication Sig Start Date End Date Taking? Authorizing Provider  calcium carbonate (OS-CAL - DOSED IN MG OF ELEMENTAL CALCIUM) 1250 (500 Ca) MG tablet Take 1 tablet by mouth 2 (two) times daily with a meal.    [provider]  Cholecalciferol (VITAMIN D3) 125 MCG (5000 UT) CAPS Take 5,000 Units by mouth once a week.    [provider]  cycloSPORINE (RESTASIS) 0.05 % ophthalmic emulsion Place 1 drop into both eyes 2 (two) times daily.    [provider]  famotidine (PEPCID) 40 MG tablet Take 40 mg by mouth daily as needed for heartburn or indigestion.    [provider]  melatonin 5 MG TABS Take 10 mg by mouth at bedtime.    [provider]  Multiple Vitamin (MULTI-VITAMIN DAILY PO) Take 1 tablet by mouth daily.    [provider]  Omega-3 Fatty Acids (FISH OIL) 1000 MG CAPS Take 1,000 mg by mouth 2 (two) times daily. 01/09/16   [provider]  rosuvastatin (CRESTOR) 10 MG tablet Take 10 mg by mouth at bedtime. 02/19/21   [provider]    Physical Exam: Vitals:   04/01/22 0111 04/01/22 0130 04/01/22 0145 04/01/22 0255  BP:  107/83 122/88 118/82  Pulse:  81 (!) 104 77  Resp:    18  Temp: 98 F (36.7 C)   98 F (36.7 C)  TempSrc: Oral     SpO2:  99% 100% 99%  Weight:      Height:        Physical Exam Vitals reviewed.  Constitutional:      General: She is not in acute distress. HENT:     Head: Normocephalic and atraumatic.  Eyes:     Extraocular Movements: Extraocular movements intact.  Cardiovascular:     Rate and Rhythm: Normal rate and regular rhythm.     Pulses: Normal pulses.  Pulmonary:     Effort: Pulmonary effort is normal. No respiratory distress.     Breath sounds: Normal  breath sounds. No wheezing or rales.  Abdominal:     General: Bowel sounds are normal. There is distension.     Palpations: Abdomen is soft.     Tenderness: There is no abdominal tenderness. There is no guarding.     Comments: Abdomen mildly distended but soft and bowel sounds present  Musculoskeletal:     Cervical back: Normal range of motion.     Right lower leg: No edema.     Left lower leg: No edema.  Skin:    General: Skin is warm and dry.  Neurological:     General: No focal deficit present.     Mental Status: She is alert and oriented to person, place, and time.  Labs on Admission: I have personally reviewed following labs and imaging studies  CBC: Recent Labs  Lab 03/31/22 2018  WBC 7.8  NEUTROABS 5.0  HGB 15.4*  HCT 45.3  MCV 94.8  PLT 347   Basic Metabolic Panel: Recent Labs  Lab 03/31/22 2018  NA 141  K 4.0  CL 99  CO2 29  GLUCOSE 97  BUN 17  CREATININE 0.89  CALCIUM 10.3   GFR: Estimated Creatinine Clearance: 60.5 mL/min (by C-G formula based on SCr of 0.89 mg/dL). Liver Function Tests: Recent Labs  Lab 03/31/22 2018  AST 29  ALT 19  ALKPHOS 63  BILITOT 0.6  PROT 7.9  ALBUMIN 4.9   Recent Labs  Lab 03/31/22 2018  LIPASE 24   No results for input(s): "AMMONIA" in the last 168 hours. Coagulation Profile: No results for input(s): "INR", "PROTIME" in the last 168 hours. Cardiac Enzymes: No results for input(s): "CKTOTAL", "CKMB", "CKMBINDEX", "TROPONINI" in the last 168 hours. BNP (last 3 results) No results for input(s): "PROBNP" in the last 8760 hours. HbA1C: No results for input(s): "HGBA1C" in the last 72 hours. CBG: No results for input(s): "GLUCAP" in the last 168 hours. Lipid Profile: No results for input(s): "CHOL", "HDL", "LDLCALC", "TRIG", "CHOLHDL", "LDLDIRECT" in the last 72 hours. Thyroid Function Tests: No results for input(s): "TSH", "T4TOTAL", "FREET4", "T3FREE", "THYROIDAB" in the last 72 hours. Anemia Panel: No  results for input(s): "VITAMINB12", "FOLATE", "FERRITIN", "TIBC", "IRON", "RETICCTPCT" in the last 72 hours. Urine analysis:    Component Value Date/Time   BILIRUBINUR Neg 10/09/2012 1206   PROTEINUR Neg 10/09/2012 1206   UROBILINOGEN negative 10/09/2012 1206   NITRITE Neg 10/09/2012 1206   LEUKOCYTESUR Negative 10/09/2012 1206    Radiological Exams on Admission: DG Abdomen 1 View  Result Date: 04/01/2022 CLINICAL DATA:  NG tube placement EXAM: ABDOMEN - 1 VIEW COMPARISON:  CT abdomen and pelvis 03/31/2022 FINDINGS: Enteric tube tip and side-port in the stomach. Nonspecific bowel-gas pattern. Surgical clips right chest wall. The lungs are clear. IMPRESSION: Enteric tube tip and side port in the stomach. Electronically Signed   By: Placido Sou M.D.   On: 04/01/2022 00:09   CT ABDOMEN PELVIS W CONTRAST  Result Date: 03/31/2022 CLINICAL DATA:  Generalized abdominal pain that started on the right side but is all of the abdomen. This happened a few months ago and was relieved with vomiting. EXAM: CT ABDOMEN AND PELVIS WITH CONTRAST TECHNIQUE: Multidetector CT imaging of the abdomen and pelvis was performed using the standard protocol following bolus administration of intravenous contrast. RADIATION DOSE REDUCTION: This exam was performed according to the departmental dose-optimization program which includes automated exposure control, adjustment of the mA and/or kV according to patient size and/or use of iterative reconstruction technique. CONTRAST:  49m OMNIPAQUE IOHEXOL 300 MG/ML  SOLN COMPARISON:  CT enterography 12/11/2021 FINDINGS: Lower chest: No acute abnormality. Hepatobiliary: No focal liver abnormality is seen. No gallstones, gallbladder wall thickening, or biliary dilatation. Pancreas: Unremarkable. No pancreatic ductal dilatation or surrounding inflammatory changes. Spleen: Normal in size without focal abnormality. Adrenals/Urinary Tract: Adrenal glands are unremarkable. Kidneys are  normal, without renal calculi, focal lesion, or hydronephrosis. Bladder is unremarkable. Stomach/Bowel: Normal caliber colon. Unremarkable stomach. Normal appendix. Dilated small bowel with abrupt transition point in the central mesentery (series 5/image 56. The small bowel downstream from the transition point is decompressed. There is fecalization of bowel contents within the dilated portion of small bowel. Question mild thickening of the small bowel wall at  the site of obstruction as appreciated on sagittal images (series 6/image 68-72). Vascular/Lymphatic: No significant vascular findings are present. No enlarged abdominal or pelvic lymph nodes. Reproductive: Uterus and bilateral adnexa are unremarkable. Other: Small amount of free fluid in the pelvis. No free intraperitoneal air. Musculoskeletal: No acute osseous abnormality. IMPRESSION: High-grade small bowel obstruction with transition point in the central abdomen. The small bowel wall is thickened at the site of obstruction. This may be due to infection/inflammation however underlying neoplasm is not excluded. Electronically Signed   By: Placido Sou M.D.   On: 03/31/2022 23:17    Assessment and Plan  High-grade SBO   -CT showing high-grade small bowel obstruction with transition point in the central abdomen. The small bowel wall is thickened at the site of obstruction. This may be due to infection/inflammation however underlying neoplasm is not excluded. -No history of prior abdominal surgeries.  Patient does report family history of colon cancer. -Infection less likely given no fever, leukocytosis, or lactic acidosis. -General surgery consulted -Keep n.p.o. -NG tube placed -IV fluid hydration -Pain management  Hyperlipidemia -Continue statin when no longer n.p.o.  DVT prophylaxis: SCDs Code Status: Full Code (discussed with the patient) Consults called: General surgery Level of care: Med-Surg Admission status: It is my clinical  opinion that admission to INPATIENT is reasonable and necessary because of the expectation that this patient will require hospital care that crosses at least 2 midnights to treat this condition based on the medical complexity of the problems presented.  Given the aforementioned information, the predictability of an adverse outcome is felt to be significant.   Shela Leff MD [facility]  If 7PM-7AM, please contact night-coverage [email or web address]  04/01/2022, 3:42 AM

## 2022-04-01 NOTE — Progress Notes (Signed)
[name] just arrived to [place] from [place]. Needs orders, asking for pain medication and throat spray.

## 2022-04-02 DIAGNOSIS — K56609 Unspecified intestinal obstruction, unspecified as to partial versus complete obstruction: Secondary | ICD-10-CM | POA: Diagnosis not present

## 2022-04-02 LAB — CBC
HCT: 39.7 % (ref 36.0–46.0)
Hemoglobin: 13 g/dL (ref 12.0–15.0)
MCH: 32.5 pg (ref 26.0–34.0)
MCHC: 32.7 g/dL (ref 30.0–36.0)
MCV: 99.3 fL (ref 80.0–100.0)
Platelets: 265 10*3/uL (ref 150–400)
RBC: 4 MIL/uL (ref 3.87–5.11)
RDW: 11.9 % (ref 11.5–15.5)
WBC: 5.8 10*3/uL (ref 4.0–10.5)
nRBC: 0 % (ref 0.0–0.2)

## 2022-04-02 LAB — BASIC METABOLIC PANEL
Anion gap: 6 (ref 5–15)
BUN: 10 mg/dL (ref 8–23)
CO2: 26 mmol/L (ref 22–32)
Calcium: 8.2 mg/dL — ABNORMAL LOW (ref 8.9–10.3)
Chloride: 109 mmol/L (ref 98–111)
Creatinine, Ser: 0.78 mg/dL (ref 0.44–1.00)
GFR, Estimated: >60 mL/min (ref >60)
Glucose, Bld: 93 mg/dL (ref 70–99)
Potassium: 3.9 mmol/L (ref 3.5–5.1)
Sodium: 141 mmol/L (ref 135–145)

## 2022-04-02 NOTE — Plan of Care (Signed)
  Problem: Education: Goal: Knowledge of General Education information will improve Description: Including pain rating scale, medication(s)/side effects and non-pharmacologic comfort measures Outcome: Progressing   Problem: Activity: Goal: Risk for activity intolerance will decrease Outcome: Progressing   Problem: Nutrition: Goal: Adequate nutrition will be maintained Outcome: Progressing   

## 2022-04-02 NOTE — Discharge Summary (Signed)
Physician Discharge Summary  [name] QQI:[id number] DOB: 1956-02-01 DOA: 03/31/2022  PCP: Pcp, No  Admit date: 03/31/2022 Discharge date: 04/02/2022  Admitted From: Home Disposition:  Home  Discharge Condition:Stable CODE STATUS:FULL, Diet recommendation:Soft diet for next 1-2 days  Brief/Interim Summary: Patient is a 67 year old female with history of hyperlipidemia, breast cancer s/p bilateral mastectomies, GERD who presented with abdomen pain, nausea, vomiting.  CT abdomen/pelvis with contrast showed high-grade small bowel obstruction with transition point in the central abdomen, small bowel thickening at the site of obstruction, suspected infection/inflammation with underlying neoplasm not excluded.  Patient was given IV fluids, started on pain medication, general surgery consulted,  NG tube placed.  Hospital course remained stable.  Her abdominal distention, pain have resolved.  NG tube has been discontinued.  Started on soft diet and she tolerated.  General surgery cleared for discharge.  Following problems were addressed during the hospitalization:  High-grade small bowel obstruction:CT abdomen/pelvis with contrast showed high-grade small bowel obstruction with transition point in the central abdomen, small bowel thickening at the site of obstruction, suspected infection/inflammation with underlying neoplasm not excluded.  Patient was given IV fluids, started on pain medication, general surgery consulted,  NG tube placed. No history of prior abdominal surgeries.  History of colon cancer in family.  Infectious processes less likely due to absence of fever, leukocytosis or lactase disease. She was following up with gastroenterology, Dr. Collene Mares, also with [facility]ology Her abdominal distention, pain have resolved.  NG tube has been discontinued.  Started on soft diet and she tolerated.  General surgery cleared for discharge. Will recommend to [facility] with the [facility]  gastroenterology as outpatient as planned   Hyperlipidemia: On statin at home.   History of breast cancer: Status post bilateral mastectomies Discharge Diagnoses:  Principal Problem:   SBO (small bowel obstruction) (HCC) Active Problems:   Hyperlipemia    Discharge Instructions  Discharge Instructions     Diet - low sodium heart healthy   Complete by: As directed    Discharge instructions   Complete by: As directed    1)Follow up with your PCP in a week 2)Continue soft diet for next 1-2 days 3)Follow up with [facility] gastroenterology as planned   Increase activity slowly   Complete by: As directed       Allergies as of 04/02/2022   No Known Allergies      Medication List     TAKE these medications    acetaminophen 500 MG tablet Commonly known as: TYLENOL Take 500 mg by mouth as needed for moderate pain.   calcium carbonate 1250 (500 Ca) MG tablet Commonly known as: OS-CAL - dosed in mg of elemental calcium Take 1 tablet by mouth 2 (two) times daily with a meal.   cycloSPORINE 0.05 % ophthalmic emulsion Commonly known as: RESTASIS Place 1 drop into both eyes 2 (two) times daily.   famotidine 40 MG tablet Commonly known as: PEPCID Take 40 mg by mouth daily as needed for heartburn or indigestion.   melatonin 5 MG Tabs Take 10 mg by mouth at bedtime.   MULTI-VITAMIN DAILY PO Take 1 tablet by mouth daily.   omega-3 acid ethyl esters 1 g capsule Commonly known as: LOVAZA Take 1 g by mouth 2 (two) times daily.   rosuvastatin 10 MG tablet Commonly known as: CRESTOR Take 10 mg by mouth at bedtime.   TUMS PO Take 3-4 tablets by mouth as needed (heartburn).   Vitamin D3 125 MCG (5000 UT)  Caps Take 5,000 Units by mouth once a week.        No Known Allergies  Consultations: Surgery   Procedures/Studies: DG Abdomen 1 View  Result Date: 04/01/2022 CLINICAL DATA:  NG tube placement EXAM: ABDOMEN - 1 VIEW COMPARISON:  CT abdomen and pelvis 03/31/2022  FINDINGS: Enteric tube tip and side-port in the stomach. Nonspecific bowel-gas pattern. Surgical clips right chest wall. The lungs are clear. IMPRESSION: Enteric tube tip and side port in the stomach. Electronically Signed   By: Placido Sou M.D.   On: 04/01/2022 00:09   CT ABDOMEN PELVIS W CONTRAST  Result Date: 03/31/2022 CLINICAL DATA:  Generalized abdominal pain that started on the right side but is all of the abdomen. This happened a few months ago and was relieved with vomiting. EXAM: CT ABDOMEN AND PELVIS WITH CONTRAST TECHNIQUE: Multidetector CT imaging of the abdomen and pelvis was performed using the standard protocol following bolus administration of intravenous contrast. RADIATION DOSE REDUCTION: This exam was performed according to the departmental dose-optimization program which includes automated exposure control, adjustment of the mA and/or kV according to patient size and/or use of iterative reconstruction technique. CONTRAST:  30m OMNIPAQUE IOHEXOL 300 MG/ML  SOLN COMPARISON:  CT enterography 12/11/2021 FINDINGS: Lower chest: No acute abnormality. Hepatobiliary: No focal liver abnormality is seen. No gallstones, gallbladder wall thickening, or biliary dilatation. Pancreas: Unremarkable. No pancreatic ductal dilatation or surrounding inflammatory changes. Spleen: Normal in size without focal abnormality. Adrenals/Urinary Tract: Adrenal glands are unremarkable. Kidneys are normal, without renal calculi, focal lesion, or hydronephrosis. Bladder is unremarkable. Stomach/Bowel: Normal caliber colon. Unremarkable stomach. Normal appendix. Dilated small bowel with abrupt transition point in the central mesentery (series 5/image 56. The small bowel downstream from the transition point is decompressed. There is fecalization of bowel contents within the dilated portion of small bowel. Question mild thickening of the small bowel wall at the site of obstruction as appreciated on sagittal images (series  6/image 68-72). Vascular/Lymphatic: No significant vascular findings are present. No enlarged abdominal or pelvic lymph nodes. Reproductive: Uterus and bilateral adnexa are unremarkable. Other: Small amount of free fluid in the pelvis. No free intraperitoneal air. Musculoskeletal: No acute osseous abnormality. IMPRESSION: High-grade small bowel obstruction with transition point in the central abdomen. The small bowel wall is thickened at the site of obstruction. This may be due to infection/inflammation however underlying neoplasm is not excluded. Electronically Signed   By: TPlacido SouM.D.   On: 03/31/2022 23:17      Subjective: Patient seen and examined at bedside today.  Hemodynamically stable.  Denies any abdominal pain, nausea or vomiting.  Passing gas but no bowel movement yet.  Very eager to go home  Discharge Exam: Vitals:   04/01/22 2110 04/02/22 0613  BP: 100/77 110/83  Pulse: 69 80  Resp: 17 18  Temp: 98.3 F (36.8 C) 98.2 F (36.8 C)  SpO2: 97% 98%   Vitals:   04/01/22 1003 04/01/22 1425 04/01/22 2110 04/02/22 0613  BP: 106/73 107/71 100/77 110/83  Pulse: 75 72 69 80  Resp: '18 18 17 18  '$ Temp: 98.6 F (37 C) 98.3 F (36.8 C) 98.3 F (36.8 C) 98.2 F (36.8 C)  TempSrc: Oral Oral Oral Oral  SpO2: 100% 100% 97% 98%  Weight:      Height:        General: Pt is alert, awake, not in acute distress Cardiovascular: RRR, S1/S2 +, no rubs, no gallops Respiratory: CTA bilaterally, no wheezing, no rhonchi Abdominal:  Soft, NT, ND, bowel sounds + Extremities: no edema, no cyanosis    The results of significant diagnostics from this hospitalization (including imaging, microbiology, ancillary and laboratory) are listed below for reference.     Microbiology: No results found for this or any previous visit (from the past 240 hour(s)).   Labs: BNP (last 3 results) No results for input(s): "BNP" in the last 8760 hours. Basic Metabolic Panel: Recent Labs  Lab  03/31/22 2018 04/01/22 0442 04/02/22 0407  NA 141 139 141  K 4.0 4.1 3.9  CL 99 110 109  CO2 29 19* 26  GLUCOSE 97 101* 93  BUN '17 14 10  '$ CREATININE 0.89 0.67 0.78  CALCIUM 10.3 8.1* 8.2*   Liver Function Tests: Recent Labs  Lab 03/31/22 2018  AST 29  ALT 19  ALKPHOS 63  BILITOT 0.6  PROT 7.9  ALBUMIN 4.9   Recent Labs  Lab 03/31/22 2018  LIPASE 24   No results for input(s): "AMMONIA" in the last 168 hours. CBC: Recent Labs  Lab 03/31/22 2018 04/01/22 0442 04/02/22 0859  WBC 7.8 5.3 5.8  NEUTROABS 5.0  --   --   HGB 15.4* 10.9* 13.0  HCT 45.3 34.6* 39.7  MCV 94.8 102.1* 99.3  PLT 294 206 265   Cardiac Enzymes: No results for input(s): "CKTOTAL", "CKMB", "CKMBINDEX", "TROPONINI" in the last 168 hours. BNP: Invalid input(s): "POCBNP" CBG: No results for input(s): "GLUCAP" in the last 168 hours. D-Dimer No results for input(s): "DDIMER" in the last 72 hours. Hgb A1c No results for input(s): "HGBA1C" in the last 72 hours. Lipid Profile No results for input(s): "CHOL", "HDL", "LDLCALC", "TRIG", "CHOLHDL", "LDLDIRECT" in the last 72 hours. Thyroid function studies No results for input(s): "TSH", "T4TOTAL", "T3FREE", "THYROIDAB" in the last 72 hours.  Invalid input(s): "FREET3" Anemia work up No results for input(s): "VITAMINB12", "FOLATE", "FERRITIN", "TIBC", "IRON", "RETICCTPCT" in the last 72 hours. Urinalysis    Component Value Date/Time   COLORURINE STRAW (A) 04/01/2022 0217   APPEARANCEUR CLEAR 04/01/2022 0217   LABSPEC 1.031 (H) 04/01/2022 0217   PHURINE 7.0 04/01/2022 0217   GLUCOSEU NEGATIVE 04/01/2022 0217   HGBUR NEGATIVE 04/01/2022 0217   BILIRUBINUR NEGATIVE 04/01/2022 0217   BILIRUBINUR Neg 10/09/2012 1206   KETONESUR 20 (A) 04/01/2022 0217   PROTEINUR NEGATIVE 04/01/2022 0217   UROBILINOGEN negative 10/09/2012 1206   NITRITE NEGATIVE 04/01/2022 0217   LEUKOCYTESUR TRACE (A) 04/01/2022 0217   Sepsis Labs Recent Labs  Lab  03/31/22 2018 04/01/22 0442 04/02/22 0859  WBC 7.8 5.3 5.8   Microbiology No results found for this or any previous visit (from the past 240 hour(s)).  Please note: You were cared for by a hospitalist during your hospital stay. Once you are discharged, your primary care physician will handle any further medical issues. Please note that NO REFILLS for any discharge medications will be authorized once you are discharged, as it is imperative that you return to your primary care physician (or establish a relationship with a primary care physician if you do not have one) for your post hospital discharge needs so that they can reassess your need for medications and monitor your lab values.    Time coordinating discharge: 40 minutes  SIGNED:   Shelly Coss, MD  [facility] 04/02/2022, 12:53 PM Pager [phone]  If 7PM-7AM, please contact night-coverage [email or web address] Password [email or web address]

## 2022-04-02 NOTE — Progress Notes (Signed)
Subjective: CC: Doing well. NGT out. Tolerating cld without abdominal pain, bloating, n/v, burping or belching. Passing flatus. No BM.   Objective: Vital signs in last 24 hours: Temp:  [98.2 F (36.8 C)-98.6 F (37 C)] 98.2 F (36.8 C) (02/02 0613) Pulse Rate:  [69-80] 80 (02/02 0613) Resp:  [17-18] 18 (02/02 0613) BP: (100-110)/(71-83) 110/83 (02/02 0613) SpO2:  [97 %-100 %] 98 % (02/02 0613)    Intake/Output from previous day: 02/01 0701 - 02/02 0700 In: 2860.7 [P.O.:480; I.V.:2380.7] Out: 2800 [Urine:2250; Emesis/NG output:550] Intake/Output this shift: Total I/O In: 1688.6 [P.O.:120; I.V.:1568.6] Out: 1250 [Urine:1250]  PE: Gen:  Alert, NAD, pleasant Abd: Soft, ND, NT, +BS  Lab Results:  Recent Labs    03/31/22 2018 04/01/22 0442  WBC 7.8 5.3  HGB 15.4* 10.9*  HCT 45.3 34.6*  PLT 294 206   BMET Recent Labs    04/01/22 0442 04/02/22 0407  NA 139 141  K 4.1 3.9  CL 110 109  CO2 19* 26  GLUCOSE 101* 93  BUN 14 10  CREATININE 0.67 0.78  CALCIUM 8.1* 8.2*   PT/INR No results for input(s): "LABPROT", "INR" in the last 72 hours. CMP     Component Value Date/Time   NA 141 04/02/2022 0407   K 3.9 04/02/2022 0407   CL 109 04/02/2022 0407   CO2 26 04/02/2022 0407   GLUCOSE 93 04/02/2022 0407   BUN 10 04/02/2022 0407   CREATININE 0.78 04/02/2022 0407   CREATININE 0.91 01/17/2019 1301   CREATININE 0.66 08/28/2012 1400   CALCIUM 8.2 (L) 04/02/2022 0407   PROT 7.9 03/31/2022 2018   ALBUMIN 4.9 03/31/2022 2018   AST 29 03/31/2022 2018   AST 22 01/17/2019 1301   ALT 19 03/31/2022 2018   ALT 17 01/17/2019 1301   ALKPHOS 63 03/31/2022 2018   BILITOT 0.6 03/31/2022 2018   BILITOT 0.9 01/17/2019 1301   GFRNONAA >60 04/02/2022 0407   GFRNONAA >60 01/17/2019 1301   GFRNONAA >89 08/28/2012 1400   GFRAA >60 01/17/2019 1301   GFRAA >89 08/28/2012 1400   Lipase     Component Value Date/Time   LIPASE 24 03/31/2022 2018    Studies/Results: DG  Abdomen 1 View  Result Date: 04/01/2022 CLINICAL DATA:  NG tube placement EXAM: ABDOMEN - 1 VIEW COMPARISON:  CT abdomen and pelvis 03/31/2022 FINDINGS: Enteric tube tip and side-port in the stomach. Nonspecific bowel-gas pattern. Surgical clips right chest wall. The lungs are clear. IMPRESSION: Enteric tube tip and side port in the stomach. Electronically Signed   By: Placido Sou M.D.   On: 04/01/2022 00:09   CT ABDOMEN PELVIS W CONTRAST  Result Date: 03/31/2022 CLINICAL DATA:  Generalized abdominal pain that started on the right side but is all of the abdomen. This happened a few months ago and was relieved with vomiting. EXAM: CT ABDOMEN AND PELVIS WITH CONTRAST TECHNIQUE: Multidetector CT imaging of the abdomen and pelvis was performed using the standard protocol following bolus administration of intravenous contrast. RADIATION DOSE REDUCTION: This exam was performed according to the departmental dose-optimization program which includes automated exposure control, adjustment of the mA and/or kV according to patient size and/or use of iterative reconstruction technique. CONTRAST:  55m OMNIPAQUE IOHEXOL 300 MG/ML  SOLN COMPARISON:  CT enterography 12/11/2021 FINDINGS: Lower chest: No acute abnormality. Hepatobiliary: No focal liver abnormality is seen. No gallstones, gallbladder wall thickening, or biliary dilatation. Pancreas: Unremarkable. No pancreatic ductal dilatation or surrounding inflammatory changes.  Spleen: Normal in size without focal abnormality. Adrenals/Urinary Tract: Adrenal glands are unremarkable. Kidneys are normal, without renal calculi, focal lesion, or hydronephrosis. Bladder is unremarkable. Stomach/Bowel: Normal caliber colon. Unremarkable stomach. Normal appendix. Dilated small bowel with abrupt transition point in the central mesentery (series 5/image 56. The small bowel downstream from the transition point is decompressed. There is fecalization of bowel contents within the  dilated portion of small bowel. Question mild thickening of the small bowel wall at the site of obstruction as appreciated on sagittal images (series 6/image 68-72). Vascular/Lymphatic: No significant vascular findings are present. No enlarged abdominal or pelvic lymph nodes. Reproductive: Uterus and bilateral adnexa are unremarkable. Other: Small amount of free fluid in the pelvis. No free intraperitoneal air. Musculoskeletal: No acute osseous abnormality. IMPRESSION: High-grade small bowel obstruction with transition point in the central abdomen. The small bowel wall is thickened at the site of obstruction. This may be due to infection/inflammation however underlying neoplasm is not excluded. Electronically Signed   By: Placido Sou M.D.   On: 03/31/2022 23:17    Anti-infectives: Anti-infectives (From admission, onward)    None        Assessment/Plan SBO 67 year old female who presents with intermittent episodes of abdominal pain, nausea and vomiting since November 2022.  She has had increased episodes over the last several weeks/months. No hx of abdominal surgeries. She has had thorough workup as listed in HPI by Dr. Collene Mares of GI (endoscopy, colonoscopy, capsule endoscopy, CTE).  She has an appointment scheduled for next Thursday, 2/8, with a GI specialist at [facility] for what sounds like is a double-balloon endoscopy for further workup of this.  Her workup in the ED yesterday showed high-grade small bowel obstruction with transition point in the central abdomen with small bowel thickened at the site of obstruction. Now clinically resolving. We discussed case with Dr. Collene Mares who agrees with favoring avoiding surgery during admission and getting her follow-up with [facility] for double-balloon endoscopy.  - Clinically resolving. ADAT. If tolerates soft diet, can be d/c'd from our standpoint with plans to f/u with [facility] as stated above next week.    FEN - FLD, ADAT VTE - SCDs, okay for chem ppx from a general  surgery standpoint ID - None  I reviewed nursing notes, hospitalist notes, last 24 h vitals and pain scores, last 48 h intake and output, last 24 h labs and trends, and last 24 h imaging results.   LOS: 1 day    Jillyn Ledger , [facility] 04/02/2022, 6:43 AM Please see Amion for pager number during day hours 7:00am-4:30pm

## 2022-04-02 NOTE — Plan of Care (Signed)
  Problem: Education: Goal: Knowledge of General Education information will improve Description: Including pain rating scale, medication(s)/side effects and non-pharmacologic comfort measures 04/02/2022 1304 by Lise Auer, LPN Outcome: Adequate for Discharge 04/02/2022 1004 by Lise Auer, LPN Outcome: Progressing   Problem: Health Behavior/Discharge Planning: Goal: Ability to manage health-related needs will improve Outcome: Adequate for Discharge   Problem: Clinical Measurements: Goal: Ability to maintain clinical measurements within normal limits will improve Outcome: Adequate for Discharge Goal: Will remain free from infection Outcome: Adequate for Discharge Goal: Diagnostic test results will improve Outcome: Adequate for Discharge Goal: Respiratory complications will improve Outcome: Adequate for Discharge Goal: Cardiovascular complication will be avoided Outcome: Adequate for Discharge   Problem: Activity: Goal: Risk for activity intolerance will decrease 04/02/2022 1304 by Iver Nestle A, LPN Outcome: Adequate for Discharge 04/02/2022 1004 by Iver Nestle A, LPN Outcome: Progressing   Problem: Nutrition: Goal: Adequate nutrition will be maintained 04/02/2022 1304 by Lise Auer, LPN Outcome: Adequate for Discharge 04/02/2022 1004 by Iver Nestle A, LPN Outcome: Progressing   Problem: Coping: Goal: Level of anxiety will decrease Outcome: Adequate for Discharge   Problem: Elimination: Goal: Will not experience complications related to bowel motility Outcome: Adequate for Discharge Goal: Will not experience complications related to urinary retention Outcome: Adequate for Discharge   Problem: Pain Managment: Goal: General experience of comfort will improve Outcome: Adequate for Discharge   Problem: Safety: Goal: Ability to remain free from injury will improve Outcome: Adequate for Discharge   Problem: Skin Integrity: Goal: Risk for impaired skin  integrity will decrease Outcome: Adequate for Discharge

## 2022-04-02 NOTE — TOC CM/SW Note (Signed)
Transition of Care [facility]) Screening Note  Patient Details  Name: [name] Date of Birth: 1955-08-16  Transition of Care [facility]) CM/SW Contact:    Sherie Don, LCSW Phone Number: 04/02/2022, 10:15 AM  Transition of Care Department [facility]) has reviewed patient and no TOC needs have been identified at this time. We will continue to monitor patient advancement through interdisciplinary progression rounds. If new patient transition needs arise, please place a TOC consult.

## 2022-04-13 ENCOUNTER — Emergency Department (HOSPITAL_COMMUNITY): Payer: PPO

## 2022-04-13 ENCOUNTER — Other Ambulatory Visit: Payer: Self-pay

## 2022-04-13 ENCOUNTER — Inpatient Hospital Stay (HOSPITAL_COMMUNITY)
Admission: EM | Admit: 2022-04-13 | Discharge: 2022-04-16 | DRG: 389 | Disposition: A | Discharge status: Home / Self Care | Payer: PPO | Attending: Internal Medicine | Admitting: Internal Medicine

## 2022-04-13 ENCOUNTER — Encounter (HOSPITAL_COMMUNITY): Payer: Self-pay | Admitting: Emergency Medicine

## 2022-04-13 DIAGNOSIS — K565 Intestinal adhesions [bands], unspecified as to partial versus complete obstruction: Secondary | ICD-10-CM | POA: Diagnosis present

## 2022-04-13 DIAGNOSIS — Z9013 Acquired absence of bilateral breasts and nipples: Secondary | ICD-10-CM | POA: Diagnosis not present

## 2022-04-13 DIAGNOSIS — M1990 Unspecified osteoarthritis, unspecified site: Secondary | ICD-10-CM | POA: Diagnosis present

## 2022-04-13 DIAGNOSIS — R188 Other ascites: Secondary | ICD-10-CM | POA: Diagnosis present

## 2022-04-13 DIAGNOSIS — C784 Secondary malignant neoplasm of small intestine: Secondary | ICD-10-CM | POA: Diagnosis present

## 2022-04-13 DIAGNOSIS — K56609 Unspecified intestinal obstruction, unspecified as to partial versus complete obstruction: Secondary | ICD-10-CM | POA: Diagnosis present

## 2022-04-13 DIAGNOSIS — K660 Peritoneal adhesions (postprocedural) (postinfection): Secondary | ICD-10-CM | POA: Diagnosis not present

## 2022-04-13 DIAGNOSIS — Z803 Family history of malignant neoplasm of breast: Secondary | ICD-10-CM

## 2022-04-13 DIAGNOSIS — Z8261 Family history of arthritis: Secondary | ICD-10-CM

## 2022-04-13 DIAGNOSIS — Z853 Personal history of malignant neoplasm of breast: Secondary | ICD-10-CM

## 2022-04-13 DIAGNOSIS — E785 Hyperlipidemia, unspecified: Secondary | ICD-10-CM | POA: Diagnosis present

## 2022-04-13 DIAGNOSIS — Z9882 Breast implant status: Secondary | ICD-10-CM

## 2022-04-13 DIAGNOSIS — Z79899 Other long term (current) drug therapy: Secondary | ICD-10-CM | POA: Diagnosis not present

## 2022-04-13 DIAGNOSIS — D0511 Intraductal carcinoma in situ of right breast: Secondary | ICD-10-CM | POA: Diagnosis not present

## 2022-04-13 DIAGNOSIS — Z800 Family history of malignant neoplasm of digestive organs: Secondary | ICD-10-CM | POA: Diagnosis not present

## 2022-04-13 LAB — URINALYSIS, ROUTINE W REFLEX MICROSCOPIC
Bilirubin Urine: NEGATIVE
Glucose, UA: NEGATIVE mg/dL
Hgb urine dipstick: NEGATIVE
Ketones, ur: 5 mg/dL — AB
Leukocytes,Ua: NEGATIVE
Nitrite: NEGATIVE
Protein, ur: NEGATIVE mg/dL
Specific Gravity, Urine: 1.013 (ref 1.005–1.030)
pH: 5 (ref 5.0–8.0)

## 2022-04-13 LAB — CBC WITH DIFFERENTIAL/PLATELET
Abs Immature Granulocytes: 0.01 10*3/uL (ref 0.00–0.07)
Basophils Absolute: 0 10*3/uL (ref 0.0–0.1)
Basophils Relative: 1 %
Eosinophils Absolute: 0.1 10*3/uL (ref 0.0–0.5)
Eosinophils Relative: 1 %
HCT: 42.3 % (ref 36.0–46.0)
Hemoglobin: 13.9 g/dL (ref 12.0–15.0)
Immature Granulocytes: 0 %
Lymphocytes Relative: 20 %
Lymphs Abs: 1.2 10*3/uL (ref 0.7–4.0)
MCH: 32.1 pg (ref 26.0–34.0)
MCHC: 32.9 g/dL (ref 30.0–36.0)
MCV: 97.7 fL (ref 80.0–100.0)
Monocytes Absolute: 0.5 10*3/uL (ref 0.1–1.0)
Monocytes Relative: 9 %
Neutro Abs: 4.2 10*3/uL (ref 1.7–7.7)
Neutrophils Relative %: 69 %
Platelets: 258 10*3/uL (ref 150–400)
RBC: 4.33 MIL/uL (ref 3.87–5.11)
RDW: 11.9 % (ref 11.5–15.5)
WBC: 6 10*3/uL (ref 4.0–10.5)
nRBC: 0 % (ref 0.0–0.2)

## 2022-04-13 LAB — COMPREHENSIVE METABOLIC PANEL
ALT: 19 U/L (ref 0–44)
AST: 25 U/L (ref 15–41)
Albumin: 4 g/dL (ref 3.5–5.0)
Alkaline Phosphatase: 62 U/L (ref 38–126)
Anion gap: 7 (ref 5–15)
BUN: 9 mg/dL (ref 8–23)
CO2: 27 mmol/L (ref 22–32)
Calcium: 9 mg/dL (ref 8.9–10.3)
Chloride: 104 mmol/L (ref 98–111)
Creatinine, Ser: 0.61 mg/dL (ref 0.44–1.00)
GFR, Estimated: >60 mL/min (ref >60)
Glucose, Bld: 107 mg/dL — ABNORMAL HIGH (ref 70–99)
Potassium: 3.9 mmol/L (ref 3.5–5.1)
Sodium: 138 mmol/L (ref 135–145)
Total Bilirubin: 1.1 mg/dL (ref 0.3–1.2)
Total Protein: 6.9 g/dL (ref 6.5–8.1)

## 2022-04-13 LAB — LIPASE, BLOOD: Lipase: 31 U/L (ref 11–51)

## 2022-04-13 MED ORDER — MELATONIN 5 MG PO TABS
10.0000 mg | ORAL_TABLET | Freq: Every day | ORAL | Status: DC
  Administered 2022-04-13: 10 mg via ORAL
  Filled 2022-04-13: qty 2

## 2022-04-13 MED ORDER — HYDRALAZINE HCL 20 MG/ML IJ SOLN: 10.0000 mg | INTRAMUSCULAR | Status: DC | PRN

## 2022-04-13 MED ORDER — ONDANSETRON 4 MG PO TBDP: 4.0000 mg | ORAL_TABLET | Freq: Four times a day (QID) | ORAL | Status: DC | PRN

## 2022-04-13 MED ORDER — MORPHINE SULFATE (PF) 4 MG/ML IV SOLN
4.0000 mg | Freq: Once | INTRAVENOUS | Status: AC
  Administered 2022-04-13: 4 mg via INTRAVENOUS
  Filled 2022-04-13: qty 1

## 2022-04-13 MED ORDER — OXYCODONE HCL 5 MG PO TABS
5.0000 mg | ORAL_TABLET | ORAL | Status: DC | PRN
  Administered 2022-04-13 (×2): 10 mg via ORAL
  Filled 2022-04-13 (×2): qty 2

## 2022-04-13 MED ORDER — ONDANSETRON HCL 4 MG/2ML IJ SOLN
4.0000 mg | Freq: Once | INTRAMUSCULAR | Status: AC
  Administered 2022-04-13: 4 mg via INTRAVENOUS
  Filled 2022-04-13: qty 2

## 2022-04-13 MED ORDER — LACTATED RINGERS IV SOLN: INTRAVENOUS | Status: DC

## 2022-04-13 MED ORDER — ONDANSETRON HCL 4 MG/2ML IJ SOLN
4.0000 mg | Freq: Four times a day (QID) | INTRAMUSCULAR | Status: DC | PRN
  Administered 2022-04-13: 4 mg via INTRAVENOUS
  Filled 2022-04-13: qty 2

## 2022-04-13 MED ORDER — CYCLOSPORINE 0.05 % OP EMUL
1.0000 [drp] | Freq: Two times a day (BID) | OPHTHALMIC | Status: DC
  Administered 2022-04-15: 1 [drp] via OPHTHALMIC
  Filled 2022-04-13 (×6): qty 30

## 2022-04-13 MED ORDER — IOHEXOL 300 MG/ML  SOLN
100.0000 mL | Freq: Once | INTRAMUSCULAR | Status: AC | PRN
  Administered 2022-04-13: 100 mL via INTRAVENOUS

## 2022-04-13 MED ORDER — ROSUVASTATIN CALCIUM 20 MG PO TABS
10.0000 mg | ORAL_TABLET | Freq: Every day | ORAL | Status: DC
  Filled 2022-04-13: qty 1

## 2022-04-13 MED ORDER — MORPHINE SULFATE (PF) 2 MG/ML IV SOLN
2.0000 mg | INTRAVENOUS | Status: DC | PRN
  Administered 2022-04-13 – 2022-04-14 (×3): 2 mg via INTRAVENOUS
  Filled 2022-04-13 (×3): qty 1

## 2022-04-13 MED ORDER — DICYCLOMINE HCL 10 MG PO CAPS
10.0000 mg | ORAL_CAPSULE | Freq: Once | ORAL | Status: AC
  Administered 2022-04-13: 10 mg via ORAL
  Filled 2022-04-13: qty 1

## 2022-04-13 MED ORDER — ACETAMINOPHEN 325 MG PO TABS: 650.0000 mg | ORAL_TABLET | Freq: Four times a day (QID) | ORAL | Status: DC | PRN

## 2022-04-13 MED ORDER — SODIUM CHLORIDE 0.9 % IV BOLUS
1000.0000 mL | Freq: Once | INTRAVENOUS | Status: AC
  Administered 2022-04-13: 1000 mL via INTRAVENOUS

## 2022-04-13 MED ORDER — ACETAMINOPHEN 650 MG RE SUPP: 650.0000 mg | Freq: Four times a day (QID) | RECTAL | Status: DC | PRN

## 2022-04-13 NOTE — ED Provider Notes (Signed)
[facility] Provider Note  CSN: [id number] Arrival date & time: 04/13/22 1000  Chief Complaint(s) Abdominal Pain  HPI [name] is a 67 y.o. 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  Patient reports that she has had recurrent abdominal pain over the past few days which is similar to previous episodes although today's is worse.  She also reports nausea and vomiting with any solid foods.  She is able to tolerate small amounts of water.  She reports that she has not had any bowel movements recently and has not also not had any passed gas.  No hematemesis, melena, urinary symptoms, cough, chest pain, fevers or chills.   Past Medical History Past Medical History:  Diagnosis Date   Arthritis    Backache, unspecified    Cancer ([place])    breast s/p BL mastectomies   Colon polyps    Family history of breast cancer 10/27/2021   Family history of colon cancer 10/27/2021   Hyperlipidemia    PONV (postoperative nausea and vomiting)    Patient Active Problem List   Diagnosis Date Noted   SBO (small bowel obstruction) ([place]) 03/31/2022   Genetic testing 11/10/2021   Family history of breast cancer 10/27/2021   Family history of colon cancer 10/27/2021   S/P breast reconstruction, bilateral 09/30/2019   S/P mastectomy, bilateral 04/13/2019   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 Authorizing Provider  acetaminophen (TYLENOL) 500 MG tablet Take 500 mg by mouth as needed for moderate pain.    [provider]  calcium carbonate (OS-CAL - DOSED IN MG OF ELEMENTAL CALCIUM) 1250 (500 Ca) MG tablet Take 1 tablet by mouth 2 (two) times daily with a meal.    [provider]  Calcium Carbonate Antacid (TUMS PO) Take 3-4 tablets by mouth as needed (heartburn).    [provider]  Cholecalciferol (VITAMIN D3) 125 MCG (5000 UT) CAPS Take 5,000 Units by mouth once a week.    [provider]  cycloSPORINE (RESTASIS) 0.05 % ophthalmic emulsion Place 1 drop into both eyes 2 (two) times daily.    [provider]  famotidine (PEPCID) 40 MG tablet Take 40 mg by mouth daily as needed for heartburn or indigestion.    [provider]  melatonin 5 MG TABS Take 10 mg by mouth at bedtime.    [provider]  Multiple Vitamin (MULTI-VITAMIN DAILY PO) Take 1 tablet by mouth daily.    [provider]  omega-3 acid ethyl esters (LOVAZA) 1 g capsule Take 1 g by mouth 2 (two) times daily.    [provider]  rosuvastatin (CRESTOR) 10 MG tablet Take 10 mg by mouth at bedtime. 02/19/21   [provider]  Past Surgical History Past Surgical History:  Procedure Laterality Date   BREAST RECONSTRUCTION WITH PLACEMENT OF TISSUE EXPANDER AND FLEX HD (ACELLULAR HYDRATED DERMIS) Bilateral 04/05/2019   Procedure: BILATERAL BREAST RECONSTRUCTION WITH PLACEMENT OF TISSUE EXPANDER AND FLEX HD (ACELLULAR HYDRATED DERMIS);  Surgeon: Wallace Going, DO;  Location: [facility];  Service: Plastics;  Laterality: Bilateral;   ENDOMETRIAL ABLATION  1998   GIVENS CAPSULE STUDY N/A 12/04/2021   Procedure: GIVENS CAPSULE STUDY;  Surgeon: Juanita Craver, MD;   Location: [facility];  Service: Gastroenterology;  Laterality: N/A;   KNEE SURGERY Right 2007   LIPOSUCTION WITH LIPOFILLING Bilateral 01/30/2020   Procedure: LIPOSUCTION WITH LIPOFILLING;  Surgeon: Wallace Going, DO;  Location: [place];  Service: Plastics;  Laterality: Bilateral;  90 min, please   MASTECTOMY W/ SENTINEL NODE BIOPSY Bilateral 04/05/2019   Procedure: BILATERAL MASTECTOMIES WITH RIGHT SENTINEL LYMPH NODE BIOPSY;  Surgeon: [name], MD;  Location: [place];  Service: General;  Laterality: Bilateral;   REMOVAL OF BILATERAL TISSUE EXPANDERS WITH PLACEMENT OF BILATERAL BREAST IMPLANTS Bilateral 09/26/2019   Procedure: REMOVAL OF BILATERAL TISSUE EXPANDERS WITH PLACEMENT OF BILATERAL BREAST IMPLANTS;  Surgeon: Wallace Going, DO;  Location: [facility];  Service: Plastics;  Laterality: Bilateral;   RHINOPLASTY  1977   TONSILLECTOMY  1961   Family History Family History  Problem Relation Age of Onset   Arthritis Mother    Colon cancer Mother 15   Melanoma Father        dx 65s; nose   Lung cancer Paternal Uncle        dx after 83; smoking hx   Breast cancer Maternal Grandmother        dx 57s   Hyperlipidemia Paternal Grandmother    Cancer Paternal Grandfather        unknown type; d. before 58   Colon cancer Other        MGM's mother    Social History Social History   Tobacco Use   Smoking status: Never   Smokeless tobacco: Never  Vaping Use   Vaping Use: Never used  Substance Use Topics   Alcohol use: Yes    Comment: 2-3/week   Drug use: No   Allergies Patient has no known allergies.  Review of Systems Review of Systems  All other systems reviewed and are negative.   Physical Exam Vital Signs  I have reviewed the triage vital signs BP (!) 113/98   Pulse 93   Temp 98.5 F (36.9 C) (Oral)   Resp 16   Ht 5' 7"$  (1.702 m)   Wt 67.1 kg   SpO2 100%   BMI 23.18 kg/m  Physical Exam Vitals  and nursing note reviewed.  Constitutional:      General: She is not in acute distress.    Appearance: She is well-developed.  HENT:     Head: Normocephalic and atraumatic.     Mouth/Throat:     Mouth: Mucous membranes are moist.  Eyes:     Pupils: Pupils are equal, round, and reactive to light.  Cardiovascular:     Rate and Rhythm: Normal rate and regular rhythm.     Heart sounds: No murmur heard. Pulmonary:     Effort: Pulmonary effort is normal. No respiratory distress.     Breath sounds: Normal breath sounds.  Abdominal:     General: Abdomen is flat.     Palpations: Abdomen is soft.  Tenderness: There is generalized abdominal tenderness.  Musculoskeletal:        General: No tenderness.     Right lower leg: No edema.     Left lower leg: No edema.  Skin:    General: Skin is warm and dry.  Neurological:     General: No focal deficit present.     Mental Status: She is alert. Mental status is at baseline.  Psychiatric:        Mood and Affect: Mood normal.        Behavior: Behavior normal.     ED Results and Treatments Labs (all labs ordered are listed, but only abnormal results are displayed) Labs Reviewed  COMPREHENSIVE METABOLIC PANEL - Abnormal; Notable for the following components:      Result Value   Glucose, Bld 107 (*)    All other components within normal limits  URINALYSIS, ROUTINE W REFLEX MICROSCOPIC - Abnormal; Notable for the following components:   Ketones, ur 5 (*)    All other components within normal limits  CBC WITH DIFFERENTIAL/PLATELET  LIPASE, BLOOD                                                                                                                          Radiology CT Abdomen Pelvis W Contrast  Result Date: 04/13/2022 CLINICAL DATA:  Abdominal pain, concern for bowel obstruction. Breast cancer, metastatic disease evaluation. * Tracking Code: BO * EXAM: CT CHEST, ABDOMEN, AND PELVIS WITH CONTRAST TECHNIQUE: Multidetector CT  imaging of the chest, abdomen and pelvis was performed following the standard protocol during bolus administration of intravenous contrast. RADIATION DOSE REDUCTION: This exam was performed according to the departmental dose-optimization program which includes automated exposure control, adjustment of the mA and/or kV according to patient size and/or use of iterative reconstruction technique. CONTRAST:  182m OMNIPAQUE IOHEXOL 300 MG/ML  SOLN COMPARISON:  CT abdomen pelvis March 31, 2022. FINDINGS: CT CHEST FINDINGS Cardiovascular: Normal caliber thoracic aorta. Aortic atherosclerosis. Normal caliber central pulmonary arteries. Normal size heart. No significant pericardial effusion/thickening. Mediastinum/Nodes: No suspicious thyroid nodule. No pathologically enlarged mediastinal, hilar or axillary lymph nodes. Small amount of crescentic feathery soft tissue stranding in the anterior mediastinum for instance on image 25/2. Prior right axillary lymph node dissection. Lungs/Pleura: No aggressive lytic or blastic lesion of bone. Biapical pleuroparenchymal scarring. Left lower lobe pneumatocele. No pleural effusion. No pneumothorax. Musculoskeletal: Prior bilateral mastectomies with breast reconstruction and right axillary lymph node dissection. Sclerotic foci in the left humeral head on image 6/2 as well as on image 9/2. Sclerotic focus in the inferior endplate of T6, with associated endplate degenerative change. CT ABDOMEN PELVIS FINDINGS Hepatobiliary: Hypodense hepatic lesions measure fluid density compatible with cysts. Additional hypodense hepatic lesions are technically too small to accurately characterize but stable from prior examination and also favored to reflect cysts. No solid enhancing hepatic lesion identified. Gallbladder is unremarkable. No biliary ductal dilation. Pancreas: No pancreatic ductal dilation or evidence of acute inflammation.  Spleen: No splenomegaly. Adrenals/Urinary Tract: Bilateral  adrenal glands appear normal. No hydronephrosis. Kidneys demonstrate symmetric enhancement. Urinary bladder is unremarkable for degree of distension Stomach/Bowel: Worsening dilation of small bowel loops measuring up to 4.3 cm in diameter on image 81/2 with abrupt transition to decompressed bowel on image 87/2 near the site of the mucosal hyperenhancement seen on CT enterography December 09, 2021. Distal to this there is another prominent fluid-filled loop of bowel in the pelvis measuring 2.2 cm also with abrupt transition to decompressed bowel in area of bowel wall thickening with adjacent pelvic free fluid. Additionally there are multifocal short and moderate length segments of small bowel wall thickening with adjacent mesenteric edema/stranding for instance in the left hemiabdomen on image 75/2. Normal appendix. Colon is predominately decompressed limiting evaluation. Vascular/Lymphatic: Aortic atherosclerosis. Normal caliber abdominal aorta. Smooth IVC contours. No pathologically enlarged abdominal or pelvic lymph nodes. Prominent ileocolic lymph nodes measure up to 6 mm in short axis on image 82/2 Reproductive: Uterus and bilateral adnexa are unremarkable. Other: Small volume mesenteric and pelvic free fluid. Musculoskeletal: Sclerotic focus in the right femoral head on image 67/5. Multilevel Schmorl's node formation with associated degenerative endplate changes. Lumbar spondylosis. Grade 1 L4 on L5 degenerative anterolisthesis. IMPRESSION: 1. Worsening long segment of dilated fluid-filled small bowel with transition point in the right hemiabdomen similar in location to the area of mucosal hyperenhancement seen on prior imaging, compatible with worsening small bowel obstruction possibly related to an inflammatory or malignant stricture or adhesion. 2. Additionally distal to this there is a prominent fluid-filled loop of small bowel with abrupt transition to decompressed bowel with apparent wall thickening of  this bowel similar to the more proximal section. Also there are multifocal short and moderate length segments of small bowel wall thickening with adjacent mesenteric edema/stranding, complex findings which may reflect multifocal disease from the above process, NSAID enteritis or superimposed infectious/inflammatory enteritis. 3. Prominent ileocolic lymph nodes measuring up to 6 mm in short axis, nonspecific but favored reactive. 4. Sclerotic foci in the left humeral head, T6 vertebral body and right femoral head are nonspecific but favored to reflect bone islands. Consider attention on nuclear medicine bone scan. 5. Small amount of crescentic feathery soft tissue stranding in the anterior mediastinum, nonspecific but favored to reflect thymic hyperplasia. 6. Prior bilateral mastectomies with breast reconstruction and right axillary lymph node dissection. 7. Hypodense hepatic lesions measure fluid density compatible with cysts. Additional hypodense hepatic lesions are technically too small to accurately characterize but also favored to reflect cysts. Attention on follow-up imaging suggested. 8.  Aortic Atherosclerosis (ICD10-I70.0). Electronically Signed   By: Dahlia Bailiff M.D.   On: 04/13/2022 16:08   CT Chest W Contrast  Result Date: 04/13/2022 CLINICAL DATA:  Abdominal pain, concern for bowel obstruction. Breast cancer, metastatic disease evaluation. * Tracking Code: BO * EXAM: CT CHEST, ABDOMEN, AND PELVIS WITH CONTRAST TECHNIQUE: Multidetector CT imaging of the chest, abdomen and pelvis was performed following the standard protocol during bolus administration of intravenous contrast. RADIATION DOSE REDUCTION: This exam was performed according to the departmental dose-optimization program which includes automated exposure control, adjustment of the mA and/or kV according to patient size and/or use of iterative reconstruction technique. CONTRAST:  115m OMNIPAQUE IOHEXOL 300 MG/ML  SOLN COMPARISON:  CT  abdomen pelvis March 31, 2022. FINDINGS: CT CHEST FINDINGS Cardiovascular: Normal caliber thoracic aorta. Aortic atherosclerosis. Normal caliber central pulmonary arteries. Normal size heart. No significant pericardial effusion/thickening. Mediastinum/Nodes: No suspicious thyroid nodule. No pathologically enlarged  mediastinal, hilar or axillary lymph nodes. Small amount of crescentic feathery soft tissue stranding in the anterior mediastinum for instance on image 25/2. Prior right axillary lymph node dissection. Lungs/Pleura: No aggressive lytic or blastic lesion of bone. Biapical pleuroparenchymal scarring. Left lower lobe pneumatocele. No pleural effusion. No pneumothorax. Musculoskeletal: Prior bilateral mastectomies with breast reconstruction and right axillary lymph node dissection. Sclerotic foci in the left humeral head on image 6/2 as well as on image 9/2. Sclerotic focus in the inferior endplate of T6, with associated endplate degenerative change. CT ABDOMEN PELVIS FINDINGS Hepatobiliary: Hypodense hepatic lesions measure fluid density compatible with cysts. Additional hypodense hepatic lesions are technically too small to accurately characterize but stable from prior examination and also favored to reflect cysts. No solid enhancing hepatic lesion identified. Gallbladder is unremarkable. No biliary ductal dilation. Pancreas: No pancreatic ductal dilation or evidence of acute inflammation. Spleen: No splenomegaly. Adrenals/Urinary Tract: Bilateral adrenal glands appear normal. No hydronephrosis. Kidneys demonstrate symmetric enhancement. Urinary bladder is unremarkable for degree of distension Stomach/Bowel: Worsening dilation of small bowel loops measuring up to 4.3 cm in diameter on image 81/2 with abrupt transition to decompressed bowel on image 87/2 near the site of the mucosal hyperenhancement seen on CT enterography December 09, 2021. Distal to this there is another prominent fluid-filled loop of  bowel in the pelvis measuring 2.2 cm also with abrupt transition to decompressed bowel in area of bowel wall thickening with adjacent pelvic free fluid. Additionally there are multifocal short and moderate length segments of small bowel wall thickening with adjacent mesenteric edema/stranding for instance in the left hemiabdomen on image 75/2. Normal appendix. Colon is predominately decompressed limiting evaluation. Vascular/Lymphatic: Aortic atherosclerosis. Normal caliber abdominal aorta. Smooth IVC contours. No pathologically enlarged abdominal or pelvic lymph nodes. Prominent ileocolic lymph nodes measure up to 6 mm in short axis on image 82/2 Reproductive: Uterus and bilateral adnexa are unremarkable. Other: Small volume mesenteric and pelvic free fluid. Musculoskeletal: Sclerotic focus in the right femoral head on image 67/5. Multilevel Schmorl's node formation with associated degenerative endplate changes. Lumbar spondylosis. Grade 1 L4 on L5 degenerative anterolisthesis. IMPRESSION: 1. Worsening long segment of dilated fluid-filled small bowel with transition point in the right hemiabdomen similar in location to the area of mucosal hyperenhancement seen on prior imaging, compatible with worsening small bowel obstruction possibly related to an inflammatory or malignant stricture or adhesion. 2. Additionally distal to this there is a prominent fluid-filled loop of small bowel with abrupt transition to decompressed bowel with apparent wall thickening of this bowel similar to the more proximal section. Also there are multifocal short and moderate length segments of small bowel wall thickening with adjacent mesenteric edema/stranding, complex findings which may reflect multifocal disease from the above process, NSAID enteritis or superimposed infectious/inflammatory enteritis. 3. Prominent ileocolic lymph nodes measuring up to 6 mm in short axis, nonspecific but favored reactive. 4. Sclerotic foci in the left  humeral head, T6 vertebral body and right femoral head are nonspecific but favored to reflect bone islands. Consider attention on nuclear medicine bone scan. 5. Small amount of crescentic feathery soft tissue stranding in the anterior mediastinum, nonspecific but favored to reflect thymic hyperplasia. 6. Prior bilateral mastectomies with breast reconstruction and right axillary lymph node dissection. 7. Hypodense hepatic lesions measure fluid density compatible with cysts. Additional hypodense hepatic lesions are technically too small to accurately characterize but also favored to reflect cysts. Attention on follow-up imaging suggested. 8.  Aortic Atherosclerosis (ICD10-I70.0). Electronically Signed   By:  Dahlia Bailiff M.D.   On: 04/13/2022 16:08   CT Head Wo Contrast  Result Date: 04/13/2022 CLINICAL DATA:  Metastatic disease evaluation. EXAM: CT HEAD WITHOUT CONTRAST TECHNIQUE: Contiguous axial images were obtained from the base of the skull through the vertex without intravenous contrast. RADIATION DOSE REDUCTION: This exam was performed according to the departmental dose-optimization program which includes automated exposure control, adjustment of the mA and/or kV according to patient size and/or use of iterative reconstruction technique. COMPARISON:  None Available. FINDINGS: Brain: No acute hemorrhage, mass effect or midline shift. Gray-white differentiation is preserved. No hydrocephalus. No extra-axial collection. Basilar cisterns are patent. Vascular: No hyperdense vessel or unexpected calcification. Skull: No calvarial fracture or suspicious bone lesion. Skull base is unremarkable. Sinuses/Orbits: Unremarkable. Other: None. IMPRESSION: No acute intracranial abnormality. Electronically Signed   By: Emmit Alexanders M.D.   On: 04/13/2022 15:25    Pertinent labs & imaging results that were available during my care of the patient were reviewed by me and considered in my medical decision making (see MDM  for details).  Medications Ordered in ED Medications  dicyclomine (BENTYL) capsule 10 mg (10 mg Oral Given 04/13/22 1023)  sodium chloride 0.9 % bolus 1,000 mL (1,000 mLs Intravenous New Bag/Given 04/13/22 1419)  morphine (PF) 4 MG/ML injection 4 mg (4 mg Intravenous Given 04/13/22 1418)  ondansetron (ZOFRAN) injection 4 mg (4 mg Intravenous Given 04/13/22 1418)  iohexol (OMNIPAQUE) 300 MG/ML solution 100 mL (100 mLs Intravenous Contrast Given 04/13/22 1500)                                                                                                                                     Procedures Procedures  (including critical care time)  Medical Decision Making / ED Course   MDM:  67 year old female presenting to the emergency department with abdominal pain.  Patient overall very well-appearing, exam notable for diffuse abdominal tenderness.  Vital signs reassuring, no fever, no tachycardia.  Labs without evidence of dehydration.  Patient does have focal jejunal narrowing on recent endoscopy and is at high risk for recurrent small bowel obstruction.  Will obtain CT scan to further evaluate.  Patient discussed with her GI doctor at [facility] who agrees.  Less likely complication from endoscopy such as perforation, patient is not really any peritoneal signs.  Doubt other causes such as pancreatitis, cholecystitis, appendicitis but will evaluate with CT scan.  Clinical Course as of 04/13/22 1715  Tue Apr 13, 2022  1713 Discussed with [WS]  1713 Discussed with general surgery team.  They will consult.  Discussed with Dr. Lorin Mercy, hospitalist.  She will admit the patient for further workup of her small bowel obstruction.  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  Patient also need oncology consult. Patient denies nausea or vomiting at this time. Will defer NGT per general surgery and keep  NPO [WS]    Clinical Course User Index [WS]  Cristie Hem, MD     Additional history obtained: -Additional history obtained from spouse -External records from outside source obtained and reviewed including: Chart review including previous notes, labs, imaging, consultation notes including recent discharge summary for same   Lab Tests: -I ordered, reviewed, and interpreted labs.   The pertinent results include:   Labs Reviewed  COMPREHENSIVE METABOLIC PANEL - Abnormal; Notable for the following components:      Result Value   Glucose, Bld 107 (*)    All other components within normal limits  URINALYSIS, ROUTINE W REFLEX MICROSCOPIC - Abnormal; Notable for the following components:   Ketones, ur 5 (*)    All other components within normal limits  CBC WITH DIFFERENTIAL/PLATELET  LIPASE, BLOOD    Notable for minimal elevation in ketones Imaging Studies ordered: I ordered imaging studies including CT AP, CT CAP, CT head On my interpretation imaging demonstrates SBO possibly multifocal, ?bone metastatic disease vs bone island, no intracranial process I independently visualized and interpreted imaging. I agree with the radiologist interpretation   Medicines ordered and prescription drug management: Meds ordered this encounter  Medications   dicyclomine (BENTYL) capsule 10 mg   sodium chloride 0.9 % bolus 1,000 mL   morphine (PF) 4 MG/ML injection 4 mg   ondansetron (ZOFRAN) injection 4 mg   iohexol (OMNIPAQUE) 300 MG/ML solution 100 mL    -I have reviewed the patients home medicines and have made adjustments as needed   Consultations Obtained: I requested consultation with the general surgeon,  and discussed lab and imaging findings as well as pertinent plan - they recommend: admit to medicine   Cardiac Monitoring: The patient was maintained on a cardiac monitor.  I personally viewed and interpreted the cardiac monitored which showed an underlying rhythm of: NSR   Reevaluation: After the interventions noted  above, I reevaluated the patient and found that they have improved  Co morbidities that complicate the patient evaluation  Past Medical History:  Diagnosis Date   Arthritis    Backache, unspecified    Cancer ([place])    breast s/p BL mastectomies   Colon polyps    Family history of breast cancer 10/27/2021   Family history of colon cancer 10/27/2021   Hyperlipidemia    PONV (postoperative nausea and vomiting)       Dispostion: Disposition decision including need for hospitalization was considered, and patient admitted to the hospital.    Final Clinical Impression(s) / ED Diagnoses Final diagnoses:  Small bowel obstruction ([place])     This chart was dictated using voice recognition software.  Despite best efforts to proofread,  errors can occur which can change the documentation meaning.    Cristie Hem, MD 04/13/22 [phone]

## 2022-04-13 NOTE — H&P (Signed)
History and Physical    Patient: [name] [id number] DOB: [birth year] DOA: 04/13/2022 DOS: the patient was seen and examined on 04/13/2022 PCP: Chesley Noon, MD  Patient coming from: Home - lives with husband; NOK: Husband, [phone]   Chief Complaint: Abdominal pain  HPI: [name] is a 67 y.o. female with medical history significant of breast cancer and HLD presenting with abdominal pain.  She was last admitted from 1/31-2/2 for high-grade SBO that was treated conservatively.   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  She felt fine for a couple of days and the pain started back after the procedure, maybe Saturday (2/10).  She was up all night Sunday night and couldn't work yesterday.  She felt worse and came back today and then got the news from [place] while in the ER.  They are hoping to be admitted for surgery, oncology here.    ER Course:  Recently admitted with SBO.  Back with abdominal pain, 2 small areas of focal tightening.  Biopsies at [facility] from enteroscopy returned with cancer, likely breast.  Would like to see oncology here at [facility].       Review of Systems: As mentioned in the history of present illness. All other systems reviewed and are negative. Past Medical History:  Diagnosis Date   Arthritis    Backache, unspecified    Cancer ([place])    breast s/p BL mastectomies   Colon polyps    Family history of breast cancer 10/27/2021   Family history of colon cancer 10/27/2021   Hyperlipidemia    PONV (postoperative nausea and vomiting)    Past Surgical History:  Procedure Laterality Date   BREAST RECONSTRUCTION WITH PLACEMENT OF TISSUE EXPANDER AND FLEX HD (ACELLULAR HYDRATED DERMIS) Bilateral 04/05/2019   Procedure: BILATERAL BREAST RECONSTRUCTION WITH PLACEMENT OF TISSUE EXPANDER AND FLEX HD (ACELLULAR HYDRATED DERMIS);  Surgeon:  Wallace Going, DO;  Location: [place];  Service: Plastics;  Laterality: Bilateral;   ENDOMETRIAL ABLATION  1998   GIVENS CAPSULE STUDY N/A 12/04/2021   Procedure: GIVENS CAPSULE STUDY;  Surgeon: Juanita Craver, MD;  Location: [place];  Service: Gastroenterology;  Laterality: N/A;   KNEE SURGERY Right 2007   LIPOSUCTION WITH LIPOFILLING Bilateral 01/30/2020   Procedure: LIPOSUCTION WITH LIPOFILLING;  Surgeon: Wallace Going, DO;  Location: [place];  Service: Plastics;  Laterality: Bilateral;  90 min, please   MASTECTOMY W/ SENTINEL NODE BIOPSY Bilateral 04/05/2019   Procedure: BILATERAL MASTECTOMIES WITH RIGHT SENTINEL LYMPH NODE BIOPSY;  Surgeon: [name], MD;  Location: [place];  Service: General;  Laterality: Bilateral;   REMOVAL OF BILATERAL TISSUE EXPANDERS WITH PLACEMENT OF BILATERAL BREAST IMPLANTS Bilateral 09/26/2019   Procedure: REMOVAL OF BILATERAL TISSUE EXPANDERS WITH PLACEMENT OF BILATERAL BREAST IMPLANTS;  Surgeon: Wallace Going, DO;  Location: [place];  Service: Plastics;  Laterality: Bilateral;   [place]   Social History:  reports that she has never smoked. She has never used smokeless tobacco. She reports current alcohol use. She reports that she does not use drugs.  No Known Allergies  Family History  Problem Relation Age of Onset   Arthritis Mother    Colon cancer Mother 62   Melanoma Father        dx 37s; nose   Lung cancer Paternal  Uncle        dx after 21; smoking hx   Breast cancer Maternal Grandmother        dx 49s   Hyperlipidemia Paternal Grandmother    Cancer Paternal Grandfather        unknown type; d. before 20   Colon cancer Other        MGM's mother    Prior to Admission medications   Medication Sig Start Date End Date Taking? Authorizing Provider  acetaminophen (TYLENOL) 500 MG tablet Take 500 mg by mouth as needed for  moderate pain.    [provider]  calcium carbonate (OS-CAL - DOSED IN MG OF ELEMENTAL CALCIUM) 1250 (500 Ca) MG tablet Take 1 tablet by mouth 2 (two) times daily with a meal.    [provider]  Calcium Carbonate Antacid (TUMS PO) Take 3-4 tablets by mouth as needed (heartburn).    [provider]  Cholecalciferol (VITAMIN D3) 125 MCG (5000 UT) CAPS Take 5,000 Units by mouth once a week.    [provider]  cycloSPORINE (RESTASIS) 0.05 % ophthalmic emulsion Place 1 drop into both eyes 2 (two) times daily.    [provider]  famotidine (PEPCID) 40 MG tablet Take 40 mg by mouth daily as needed for heartburn or indigestion.    [provider]  melatonin 5 MG TABS Take 10 mg by mouth at bedtime.    [provider]  Multiple Vitamin (MULTI-VITAMIN DAILY PO) Take 1 tablet by mouth daily.    [provider]  omega-3 acid ethyl esters (LOVAZA) 1 g capsule Take 1 g by mouth 2 (two) times daily.    [provider]  rosuvastatin (CRESTOR) 10 MG tablet Take 10 mg by mouth at bedtime. 02/19/21   [provider]    Physical Exam: Vitals:   04/13/22 1406 04/13/22 1430 04/13/22 1748 04/13/22 1811  BP:  (!) 113/98  134/89  Pulse:  93    Resp:  16  18  Temp: 98.5 F (36.9 C)  98.5 F (36.9 C) 98.4 F (36.9 C)  TempSrc: Oral  Oral Oral  SpO2:  100%    Weight:      Height:       General:  Appears calm and comfortable and is in NAD Eyes:   EOMI, normal lids, iris ENT:  grossly normal hearing, lips & tongue, mmm; appropriate dentition Neck:  no LAD, masses or thyromegaly Cardiovascular:  RRR, no m/r/g. No LE edema.  Respiratory:   CTA bilaterally with no wheezes/rales/rhonchi.  Normal respiratory effort. Abdomen:  diffusely TTP with most tenderness in RLQ Skin:  no rash or induration seen on limited exam Musculoskeletal:  grossly normal tone BUE/BLE, good ROM, no bony abnormality Psychiatric:  grossly normal  mood and affect, speech fluent and appropriate, AOx3 Neurologic:  CN 2-12 grossly intact, moves all extremities in coordinated fashion   Radiological Exams on Admission: Independently reviewed - see discussion in A/P where applicable  CT Abdomen Pelvis W Contrast  Result Date: 04/13/2022 CLINICAL DATA:  Abdominal pain, concern for bowel obstruction. Breast cancer, metastatic disease evaluation. * Tracking Code: BO * EXAM: CT CHEST, ABDOMEN, AND PELVIS WITH CONTRAST TECHNIQUE: Multidetector CT imaging of the chest, abdomen and pelvis was performed following the standard protocol during bolus administration of intravenous contrast. RADIATION DOSE REDUCTION: This exam was performed according to the departmental dose-optimization program which includes automated exposure control, adjustment of the mA and/or kV according to patient size and/or use  of iterative reconstruction technique. CONTRAST:  123m OMNIPAQUE IOHEXOL 300 MG/ML  SOLN COMPARISON:  CT abdomen pelvis March 31, 2022. FINDINGS: CT CHEST FINDINGS Cardiovascular: Normal caliber thoracic aorta. Aortic atherosclerosis. Normal caliber central pulmonary arteries. Normal size heart. No significant pericardial effusion/thickening. Mediastinum/Nodes: No suspicious thyroid nodule. No pathologically enlarged mediastinal, hilar or axillary lymph nodes. Small amount of crescentic feathery soft tissue stranding in the anterior mediastinum for instance on image 25/2. Prior right axillary lymph node dissection. Lungs/Pleura: No aggressive lytic or blastic lesion of bone. Biapical pleuroparenchymal scarring. Left lower lobe pneumatocele. No pleural effusion. No pneumothorax. Musculoskeletal: Prior bilateral mastectomies with breast reconstruction and right axillary lymph node dissection. Sclerotic foci in the left humeral head on image 6/2 as well as on image 9/2. Sclerotic focus in the inferior endplate of T6, with associated endplate degenerative change. CT  ABDOMEN PELVIS FINDINGS Hepatobiliary: Hypodense hepatic lesions measure fluid density compatible with cysts. Additional hypodense hepatic lesions are technically too small to accurately characterize but stable from prior examination and also favored to reflect cysts. No solid enhancing hepatic lesion identified. Gallbladder is unremarkable. No biliary ductal dilation. Pancreas: No pancreatic ductal dilation or evidence of acute inflammation. Spleen: No splenomegaly. Adrenals/Urinary Tract: Bilateral adrenal glands appear normal. No hydronephrosis. Kidneys demonstrate symmetric enhancement. Urinary bladder is unremarkable for degree of distension Stomach/Bowel: Worsening dilation of small bowel loops measuring up to 4.3 cm in diameter on image 81/2 with abrupt transition to decompressed bowel on image 87/2 near the site of the mucosal hyperenhancement seen on CT enterography December 09, 2021. Distal to this there is another prominent fluid-filled loop of bowel in the pelvis measuring 2.2 cm also with abrupt transition to decompressed bowel in area of bowel wall thickening with adjacent pelvic free fluid. Additionally there are multifocal short and moderate length segments of small bowel wall thickening with adjacent mesenteric edema/stranding for instance in the left hemiabdomen on image 75/2. Normal appendix. Colon is predominately decompressed limiting evaluation. Vascular/Lymphatic: Aortic atherosclerosis. Normal caliber abdominal aorta. Smooth IVC contours. No pathologically enlarged abdominal or pelvic lymph nodes. Prominent ileocolic lymph nodes measure up to 6 mm in short axis on image 82/2 Reproductive: Uterus and bilateral adnexa are unremarkable. Other: Small volume mesenteric and pelvic free fluid. Musculoskeletal: Sclerotic focus in the right femoral head on image 67/5. Multilevel Schmorl's node formation with associated degenerative endplate changes. Lumbar spondylosis. Grade 1 L4 on L5 degenerative  anterolisthesis. IMPRESSION: 1. Worsening long segment of dilated fluid-filled small bowel with transition point in the right hemiabdomen similar in location to the area of mucosal hyperenhancement seen on prior imaging, compatible with worsening small bowel obstruction possibly related to an inflammatory or malignant stricture or adhesion. 2. Additionally distal to this there is a prominent fluid-filled loop of small bowel with abrupt transition to decompressed bowel with apparent wall thickening of this bowel similar to the more proximal section. Also there are multifocal short and moderate length segments of small bowel wall thickening with adjacent mesenteric edema/stranding, complex findings which may reflect multifocal disease from the above process, NSAID enteritis or superimposed infectious/inflammatory enteritis. 3. Prominent ileocolic lymph nodes measuring up to 6 mm in short axis, nonspecific but favored reactive. 4. Sclerotic foci in the left humeral head, T6 vertebral body and right femoral head are nonspecific but favored to reflect bone islands. Consider attention on nuclear medicine bone scan. 5. Small amount of crescentic feathery soft tissue stranding in the anterior mediastinum, nonspecific but favored to reflect thymic hyperplasia. 6. Prior bilateral  mastectomies with breast reconstruction and right axillary lymph node dissection. 7. Hypodense hepatic lesions measure fluid density compatible with cysts. Additional hypodense hepatic lesions are technically too small to accurately characterize but also favored to reflect cysts. Attention on follow-up imaging suggested. 8.  Aortic Atherosclerosis (ICD10-I70.0). Electronically Signed   By: Dahlia Bailiff M.D.   On: 04/13/2022 16:08   CT Chest W Contrast  Result Date: 04/13/2022 CLINICAL DATA:  Abdominal pain, concern for bowel obstruction. Breast cancer, metastatic disease evaluation. * Tracking Code: BO * EXAM: CT CHEST, ABDOMEN, AND PELVIS WITH  CONTRAST TECHNIQUE: Multidetector CT imaging of the chest, abdomen and pelvis was performed following the standard protocol during bolus administration of intravenous contrast. RADIATION DOSE REDUCTION: This exam was performed according to the departmental dose-optimization program which includes automated exposure control, adjustment of the mA and/or kV according to patient size and/or use of iterative reconstruction technique. CONTRAST:  159m OMNIPAQUE IOHEXOL 300 MG/ML  SOLN COMPARISON:  CT abdomen pelvis March 31, 2022. FINDINGS: CT CHEST FINDINGS Cardiovascular: Normal caliber thoracic aorta. Aortic atherosclerosis. Normal caliber central pulmonary arteries. Normal size heart. No significant pericardial effusion/thickening. Mediastinum/Nodes: No suspicious thyroid nodule. No pathologically enlarged mediastinal, hilar or axillary lymph nodes. Small amount of crescentic feathery soft tissue stranding in the anterior mediastinum for instance on image 25/2. Prior right axillary lymph node dissection. Lungs/Pleura: No aggressive lytic or blastic lesion of bone. Biapical pleuroparenchymal scarring. Left lower lobe pneumatocele. No pleural effusion. No pneumothorax. Musculoskeletal: Prior bilateral mastectomies with breast reconstruction and right axillary lymph node dissection. Sclerotic foci in the left humeral head on image 6/2 as well as on image 9/2. Sclerotic focus in the inferior endplate of T6, with associated endplate degenerative change. CT ABDOMEN PELVIS FINDINGS Hepatobiliary: Hypodense hepatic lesions measure fluid density compatible with cysts. Additional hypodense hepatic lesions are technically too small to accurately characterize but stable from prior examination and also favored to reflect cysts. No solid enhancing hepatic lesion identified. Gallbladder is unremarkable. No biliary ductal dilation. Pancreas: No pancreatic ductal dilation or evidence of acute inflammation. Spleen: No splenomegaly.  Adrenals/Urinary Tract: Bilateral adrenal glands appear normal. No hydronephrosis. Kidneys demonstrate symmetric enhancement. Urinary bladder is unremarkable for degree of distension Stomach/Bowel: Worsening dilation of small bowel loops measuring up to 4.3 cm in diameter on image 81/2 with abrupt transition to decompressed bowel on image 87/2 near the site of the mucosal hyperenhancement seen on CT enterography December 09, 2021. Distal to this there is another prominent fluid-filled loop of bowel in the pelvis measuring 2.2 cm also with abrupt transition to decompressed bowel in area of bowel wall thickening with adjacent pelvic free fluid. Additionally there are multifocal short and moderate length segments of small bowel wall thickening with adjacent mesenteric edema/stranding for instance in the left hemiabdomen on image 75/2. Normal appendix. Colon is predominately decompressed limiting evaluation. Vascular/Lymphatic: Aortic atherosclerosis. Normal caliber abdominal aorta. Smooth IVC contours. No pathologically enlarged abdominal or pelvic lymph nodes. Prominent ileocolic lymph nodes measure up to 6 mm in short axis on image 82/2 Reproductive: Uterus and bilateral adnexa are unremarkable. Other: Small volume mesenteric and pelvic free fluid. Musculoskeletal: Sclerotic focus in the right femoral head on image 67/5. Multilevel Schmorl's node formation with associated degenerative endplate changes. Lumbar spondylosis. Grade 1 L4 on L5 degenerative anterolisthesis. IMPRESSION: 1. Worsening long segment of dilated fluid-filled small bowel with transition point in the right hemiabdomen similar in location to the area of mucosal hyperenhancement seen on prior imaging, compatible with worsening small  bowel obstruction possibly related to an inflammatory or malignant stricture or adhesion. 2. Additionally distal to this there is a prominent fluid-filled loop of small bowel with abrupt transition to decompressed bowel  with apparent wall thickening of this bowel similar to the more proximal section. Also there are multifocal short and moderate length segments of small bowel wall thickening with adjacent mesenteric edema/stranding, complex findings which may reflect multifocal disease from the above process, NSAID enteritis or superimposed infectious/inflammatory enteritis. 3. Prominent ileocolic lymph nodes measuring up to 6 mm in short axis, nonspecific but favored reactive. 4. Sclerotic foci in the left humeral head, T6 vertebral body and right femoral head are nonspecific but favored to reflect bone islands. Consider attention on nuclear medicine bone scan. 5. Small amount of crescentic feathery soft tissue stranding in the anterior mediastinum, nonspecific but favored to reflect thymic hyperplasia. 6. Prior bilateral mastectomies with breast reconstruction and right axillary lymph node dissection. 7. Hypodense hepatic lesions measure fluid density compatible with cysts. Additional hypodense hepatic lesions are technically too small to accurately characterize but also favored to reflect cysts. Attention on follow-up imaging suggested. 8.  Aortic Atherosclerosis (ICD10-I70.0). Electronically Signed   By: Dahlia Bailiff M.D.   On: 04/13/2022 16:08   CT Head Wo Contrast  Result Date: 04/13/2022 CLINICAL DATA:  Metastatic disease evaluation. EXAM: CT HEAD WITHOUT CONTRAST TECHNIQUE: Contiguous axial images were obtained from the base of the skull through the vertex without intravenous contrast. RADIATION DOSE REDUCTION: This exam was performed according to the departmental dose-optimization program which includes automated exposure control, adjustment of the mA and/or kV according to patient size and/or use of iterative reconstruction technique. COMPARISON:  None Available. FINDINGS: Brain: No acute hemorrhage, mass effect or midline shift. Gray-white differentiation is preserved. No hydrocephalus. No extra-axial collection.  Basilar cisterns are patent. Vascular: No hyperdense vessel or unexpected calcification. Skull: No calvarial fracture or suspicious bone lesion. Skull base is unremarkable. Sinuses/Orbits: Unremarkable. Other: None. IMPRESSION: No acute intracranial abnormality. Electronically Signed   By: Emmit Alexanders M.D.   On: 04/13/2022 15:25    EKG: not done   Labs on Admission: I have personally reviewed the available labs and imaging studies at the time of the admission.  Pertinent labs:    Glucose 107 Normal CBC UA; 5 ketones   Assessment and Plan: Principal Problem:   SBO (small bowel obstruction) (HCC) Active Problems:   Hyperlipemia   Ductal carcinoma in situ (DCIS) of right breast    SBO -Patient with h/o remote breast cancer who was recently admitted with SBO -She went to [facility] following that admission and had enteroscopy with biopsies which came back today as positive for poorly differentiated carcinoma possibly from breast cancer -Imaging today with what appear to be several foci of obstruction presumed to be from malignancy -Will admit to Med Surg -Gen Surg consulted by ER and will see in AM; anticipate surgery tomorrow -Clear liquid sips tonight, NPO after MN -NG tube declined by patient for now, as she does not appear to be actively obstructed -IVF hydration -Pain control with morphine  Metastatic breast cancer -Given apparent metastatic disease, she appears to need chemotherapy post-operatively -She has asked to have oncology here see her during the hospitalization -Will add Dr. Irene Limbo to the treatment team and send him a Secure Chat message  HLD -Continue rosuvastatin -Hold Lovaza for now given limited inpatient utility    Advance Care Planning:   Code Status: Full Code - Code status was discussed with  the patient and/or family at the time of admission.  The patient would want to receive full resuscitative measures at this time.   Consults: Surgery; oncology  DVT  Prophylaxis: SCDs  Family Communication: Husband was present throughout evaluation  Severity of Illness: The appropriate patient status for this patient is INPATIENT. Inpatient status is judged to be reasonable and necessary in order to provide the required intensity of service to ensure the patient's safety. The patient's presenting symptoms, physical exam findings, and initial radiographic and laboratory data in the context of their chronic comorbidities is felt to place them at high risk for further clinical deterioration. Furthermore, it is not anticipated that the patient will be medically stable for discharge from the hospital within 2 midnights of admission.   * I certify that at the point of admission it is my clinical judgment that the patient will require inpatient hospital care spanning beyond 2 midnights from the point of admission due to high intensity of service, high risk for further deterioration and high frequency of surveillance required.*  Author: Karmen Bongo, MD 04/13/2022 7:19 PM  For on call review [email or web address].

## 2022-04-13 NOTE — ED Notes (Signed)
Patient transported to CT 

## 2022-04-13 NOTE — ED Triage Notes (Signed)
Patient arrives ambulatory by POV c/o abdominal pain x 1 year. Reports having an EGD Thursday at [facility] and rule out SBO.

## 2022-04-13 NOTE — ED Provider Triage Note (Signed)
Emergency Medicine Provider Triage Evaluation Note  [name] , a 67 y.o. female  was evaluated in triage.  Pt complains of presents emergency room complaining of abdominal cramping which is been ongoing intermittently for over a year.  Patient states symptoms first began in November 2022.  She was admitted to the hospital last week with concerns of a possible small bowel obstruction.  Patient reportedly had scoping done after CT which showed no blockage.  She is currently waiting on biopsy results from endoscopy.  Patient states that the cramping pain normally improves after a brief period of time and at this time the pain has been ongoing.  She states she is here mostly for pain control.  She states that she has been able to drink water but unable to eat solid foods since Thursday of last week.  She has not have bowel movements but has not been eating food  Review of Systems  Positive: As above Negative: As above  Physical Exam  BP (!) 124/92 (BP Location: Left Arm)   Pulse 85   Temp 98.4 F (36.9 C) (Oral)   Resp 16   Ht 5' 7"$  (1.702 m)   Wt 67.1 kg   SpO2 100%   BMI 23.18 kg/m  Gen:   Awake, no distress   Resp:  Normal effort  MSK:   Moves extremities without difficulty  Other:    Medical Decision Making  Medically screening exam initiated at 10:16 AM.  Appropriate orders placed.  [name] was informed that the remainder of the evaluation will be completed by another provider, this initial triage assessment does not replace that evaluation, and the importance of remaining in the ED until their evaluation is complete.     Caroline Peng, PA-C 04/13/22 1017

## 2022-04-14 ENCOUNTER — Inpatient Hospital Stay (HOSPITAL_COMMUNITY): Payer: PPO | Admitting: Anesthesiology

## 2022-04-14 ENCOUNTER — Encounter (HOSPITAL_COMMUNITY): Payer: Self-pay | Admitting: Internal Medicine

## 2022-04-14 ENCOUNTER — Other Ambulatory Visit: Payer: Self-pay

## 2022-04-14 ENCOUNTER — Encounter (HOSPITAL_COMMUNITY)
Admission: EM | Disposition: A | Discharge status: Home / Self Care | Payer: Self-pay | Source: Home / Self Care | Attending: Internal Medicine

## 2022-04-14 DIAGNOSIS — D0511 Intraductal carcinoma in situ of right breast: Secondary | ICD-10-CM | POA: Diagnosis not present

## 2022-04-14 DIAGNOSIS — K56609 Unspecified intestinal obstruction, unspecified as to partial versus complete obstruction: Secondary | ICD-10-CM

## 2022-04-14 DIAGNOSIS — E785 Hyperlipidemia, unspecified: Secondary | ICD-10-CM

## 2022-04-14 DIAGNOSIS — K660 Peritoneal adhesions (postprocedural) (postinfection): Secondary | ICD-10-CM

## 2022-04-14 HISTORY — PX: LAPAROSCOPY: SHX197

## 2022-04-14 LAB — BASIC METABOLIC PANEL
Anion gap: 11 (ref 5–15)
BUN: 8 mg/dL (ref 8–23)
CO2: 23 mmol/L (ref 22–32)
Calcium: 8.7 mg/dL — ABNORMAL LOW (ref 8.9–10.3)
Chloride: 104 mmol/L (ref 98–111)
Creatinine, Ser: 0.75 mg/dL (ref 0.44–1.00)
GFR, Estimated: >60 mL/min (ref >60)
Glucose, Bld: 122 mg/dL — ABNORMAL HIGH (ref 70–99)
Potassium: 3.6 mmol/L (ref 3.5–5.1)
Sodium: 138 mmol/L (ref 135–145)

## 2022-04-14 LAB — CBC
HCT: 37.4 % (ref 36.0–46.0)
Hemoglobin: 12.2 g/dL (ref 12.0–15.0)
MCH: 32 pg (ref 26.0–34.0)
MCHC: 32.6 g/dL (ref 30.0–36.0)
MCV: 98.2 fL (ref 80.0–100.0)
Platelets: 249 10*3/uL (ref 150–400)
RBC: 3.81 MIL/uL — ABNORMAL LOW (ref 3.87–5.11)
RDW: 11.9 % (ref 11.5–15.5)
WBC: 6.4 10*3/uL (ref 4.0–10.5)
nRBC: 0 % (ref 0.0–0.2)

## 2022-04-14 SURGERY — LAPAROSCOPY, DIAGNOSTIC
Anesthesia: General

## 2022-04-14 MED ORDER — MIDAZOLAM HCL 5 MG/5ML IJ SOLN
INTRAMUSCULAR | Status: DC | PRN
  Administered 2022-04-14: 2 mg via INTRAVENOUS

## 2022-04-14 MED ORDER — ONDANSETRON HCL 4 MG/2ML IJ SOLN: 4.0000 mg | Freq: Once | INTRAMUSCULAR | Status: DC | PRN

## 2022-04-14 MED ORDER — PHENYLEPHRINE 80 MCG/ML (10ML) SYRINGE FOR IV PUSH (FOR BLOOD PRESSURE SUPPORT)
PREFILLED_SYRINGE | INTRAVENOUS | Status: AC
  Filled 2022-04-14: qty 10

## 2022-04-14 MED ORDER — ACETAMINOPHEN 10 MG/ML IV SOLN
INTRAVENOUS | Status: AC
  Filled 2022-04-14: qty 100

## 2022-04-14 MED ORDER — ROCURONIUM BROMIDE 10 MG/ML (PF) SYRINGE
PREFILLED_SYRINGE | INTRAVENOUS | Status: DC | PRN
  Administered 2022-04-14: 30 mg via INTRAVENOUS

## 2022-04-14 MED ORDER — ONDANSETRON HCL 4 MG/2ML IJ SOLN
INTRAMUSCULAR | Status: AC
  Filled 2022-04-14: qty 2

## 2022-04-14 MED ORDER — SODIUM CHLORIDE 0.9 % IV SOLN
2.0000 g | INTRAVENOUS | Status: AC
  Administered 2022-04-14: 2 g via INTRAVENOUS
  Filled 2022-04-14: qty 2

## 2022-04-14 MED ORDER — SUCCINYLCHOLINE CHLORIDE 200 MG/10ML IV SOSY
PREFILLED_SYRINGE | INTRAVENOUS | Status: DC | PRN
  Administered 2022-04-14: 120 mg via INTRAVENOUS

## 2022-04-14 MED ORDER — SUGAMMADEX SODIUM 200 MG/2ML IV SOLN
INTRAVENOUS | Status: DC | PRN
  Administered 2022-04-14: 200 mg via INTRAVENOUS

## 2022-04-14 MED ORDER — MELATONIN 5 MG PO TABS
10.0000 mg | ORAL_TABLET | Freq: Every evening | ORAL | Status: DC | PRN
  Administered 2022-04-14 – 2022-04-15 (×2): 10 mg via ORAL
  Filled 2022-04-14 (×2): qty 2

## 2022-04-14 MED ORDER — MORPHINE SULFATE (PF) 2 MG/ML IV SOLN: 2.0000 mg | INTRAVENOUS | Status: DC | PRN

## 2022-04-14 MED ORDER — CHLORHEXIDINE GLUCONATE 0.12 % MT SOLN
15.0000 mL | Freq: Once | OROMUCOSAL | Status: AC
  Administered 2022-04-14: 15 mL via OROMUCOSAL

## 2022-04-14 MED ORDER — DROPERIDOL 2.5 MG/ML IJ SOLN
INTRAMUSCULAR | Status: DC | PRN
  Administered 2022-04-14: .625 mg via INTRAVENOUS

## 2022-04-14 MED ORDER — 0.9 % SODIUM CHLORIDE (POUR BTL) OPTIME: TOPICAL | Status: DC | PRN

## 2022-04-14 MED ORDER — FENTANYL CITRATE (PF) 250 MCG/5ML IJ SOLN
INTRAMUSCULAR | Status: AC
  Filled 2022-04-14: qty 5

## 2022-04-14 MED ORDER — ORAL CARE MOUTH RINSE: 15.0000 mL | Freq: Once | OROMUCOSAL | Status: AC

## 2022-04-14 MED ORDER — STERILE WATER FOR IRRIGATION IR SOLN
Status: DC | PRN
  Administered 2022-04-14: 1000 mL

## 2022-04-14 MED ORDER — ROCURONIUM BROMIDE 10 MG/ML (PF) SYRINGE
PREFILLED_SYRINGE | INTRAVENOUS | Status: AC
  Filled 2022-04-14: qty 10

## 2022-04-14 MED ORDER — FENTANYL CITRATE PF 50 MCG/ML IJ SOSY: 25.0000 ug | PREFILLED_SYRINGE | INTRAMUSCULAR | Status: DC | PRN

## 2022-04-14 MED ORDER — LACTATED RINGERS IR SOLN
Status: DC | PRN
  Administered 2022-04-14: 1000 mL

## 2022-04-14 MED ORDER — MIDAZOLAM HCL 2 MG/2ML IJ SOLN
INTRAMUSCULAR | Status: AC
  Filled 2022-04-14: qty 2

## 2022-04-14 MED ORDER — DEXAMETHASONE SODIUM PHOSPHATE 10 MG/ML IJ SOLN
INTRAMUSCULAR | Status: DC | PRN
  Administered 2022-04-14: 10 mg via INTRAVENOUS

## 2022-04-14 MED ORDER — KETOROLAC TROMETHAMINE 30 MG/ML IJ SOLN: 30.0000 mg | Freq: Once | INTRAMUSCULAR | Status: DC | PRN

## 2022-04-14 MED ORDER — LACTATED RINGERS IV SOLN: INTRAVENOUS | Status: DC

## 2022-04-14 MED ORDER — SCOPOLAMINE 1 MG/3DAYS TD PT72
MEDICATED_PATCH | TRANSDERMAL | Status: AC
  Filled 2022-04-14: qty 1

## 2022-04-14 MED ORDER — ACETAMINOPHEN 10 MG/ML IV SOLN
INTRAVENOUS | Status: DC | PRN
  Administered 2022-04-14: 1000 mg via INTRAVENOUS

## 2022-04-14 MED ORDER — ACETAMINOPHEN 500 MG PO TABS
1000.0000 mg | ORAL_TABLET | Freq: Four times a day (QID) | ORAL | Status: DC | PRN
  Administered 2022-04-15 – 2022-04-16 (×4): 1000 mg via ORAL
  Filled 2022-04-14 (×4): qty 2

## 2022-04-14 MED ORDER — PROCHLORPERAZINE EDISYLATE 10 MG/2ML IJ SOLN
5.0000 mg | Freq: Once | INTRAMUSCULAR | Status: AC
  Administered 2022-04-14: 5 mg via INTRAVENOUS
  Filled 2022-04-14: qty 2

## 2022-04-14 MED ORDER — PHENYLEPHRINE 80 MCG/ML (10ML) SYRINGE FOR IV PUSH (FOR BLOOD PRESSURE SUPPORT)
PREFILLED_SYRINGE | INTRAVENOUS | Status: DC | PRN
  Administered 2022-04-14 (×2): 80 ug via INTRAVENOUS

## 2022-04-14 MED ORDER — PROPOFOL 10 MG/ML IV BOLUS
INTRAVENOUS | Status: DC | PRN
  Administered 2022-04-14: 120 mg via INTRAVENOUS

## 2022-04-14 MED ORDER — ONDANSETRON HCL 4 MG/2ML IJ SOLN
INTRAMUSCULAR | Status: DC | PRN
  Administered 2022-04-14: 4 mg via INTRAVENOUS

## 2022-04-14 MED ORDER — SUCCINYLCHOLINE CHLORIDE 200 MG/10ML IV SOSY
PREFILLED_SYRINGE | INTRAVENOUS | Status: AC
  Filled 2022-04-14: qty 10

## 2022-04-14 MED ORDER — LIDOCAINE 2% (20 MG/ML) 5 ML SYRINGE
INTRAMUSCULAR | Status: DC | PRN
  Administered 2022-04-14: 100 mg via INTRAVENOUS

## 2022-04-14 MED ORDER — PROPOFOL 10 MG/ML IV BOLUS
INTRAVENOUS | Status: AC
  Filled 2022-04-14: qty 20

## 2022-04-14 MED ORDER — BUPIVACAINE HCL (PF) 0.25 % IJ SOLN
INTRAMUSCULAR | Status: DC | PRN
  Administered 2022-04-14: 30 mL

## 2022-04-14 MED ORDER — SUGAMMADEX SODIUM 200 MG/2ML IV SOLN: INTRAVENOUS | Status: DC | PRN

## 2022-04-14 MED ORDER — BUPIVACAINE HCL (PF) 0.25 % IJ SOLN
INTRAMUSCULAR | Status: AC
  Filled 2022-04-14: qty 30

## 2022-04-14 MED ORDER — DROPERIDOL 2.5 MG/ML IJ SOLN
INTRAMUSCULAR | Status: AC
  Filled 2022-04-14: qty 2

## 2022-04-14 MED ORDER — LIDOCAINE HCL (PF) 2 % IJ SOLN
INTRAMUSCULAR | Status: AC
  Filled 2022-04-14: qty 5

## 2022-04-14 MED ORDER — FENTANYL CITRATE (PF) 250 MCG/5ML IJ SOLN
INTRAMUSCULAR | Status: DC | PRN
  Administered 2022-04-14 (×2): 50 ug via INTRAVENOUS

## 2022-04-14 MED ORDER — OXYCODONE HCL 5 MG PO TABS
5.0000 mg | ORAL_TABLET | ORAL | Status: DC | PRN
  Administered 2022-04-14: 10 mg via ORAL
  Filled 2022-04-14: qty 2

## 2022-04-14 MED ORDER — DEXAMETHASONE SODIUM PHOSPHATE 10 MG/ML IJ SOLN
INTRAMUSCULAR | Status: AC
  Filled 2022-04-14: qty 1

## 2022-04-14 SURGICAL SUPPLY — 63 items
ADH SKN CLS APL DERMABOND .7 (GAUZE/BANDAGES/DRESSINGS) ×1
APL PRP STRL LF DISP 70% ISPRP (MISCELLANEOUS) ×1
APPLIER CLIP 5 13 M/L LIGAMAX5 (MISCELLANEOUS)
APPLIER CLIP ROT 10 11.4 M/L (STAPLE)
APR CLP MED LRG 11.4X10 (STAPLE)
APR CLP MED LRG 5 ANG JAW (MISCELLANEOUS)
BAG COUNTER SPONGE SURGICOUNT (BAG) IMPLANT
BAG SPNG CNTER NS LX DISP (BAG)
BLADE EXTENDED COATED 6.5IN (ELECTRODE) IMPLANT
BLADE SURG SZ10 CARB STEEL (BLADE) IMPLANT
CELLS DAT CNTRL 66122 CELL SVR (MISCELLANEOUS) IMPLANT
CHLORAPREP W/TINT 26 (MISCELLANEOUS) ×1 IMPLANT
CLIP APPLIE 5 13 M/L LIGAMAX5 (MISCELLANEOUS) IMPLANT
CLIP APPLIE ROT 10 11.4 M/L (STAPLE) IMPLANT
COVER MAYO STAND STRL (DRAPES) IMPLANT
COVER SURGICAL LIGHT HANDLE (MISCELLANEOUS) ×1 IMPLANT
DERMABOND ADVANCED .7 DNX12 (GAUZE/BANDAGES/DRESSINGS) IMPLANT
DRAPE SHEET LG 3/4 BI-LAMINATE (DRAPES) IMPLANT
DRAPE WARM FLUID 44X44 (DRAPES) IMPLANT
ELECT REM PT RETURN 15FT ADLT (MISCELLANEOUS) ×1 IMPLANT
GAUZE SPONGE 4X4 12PLY STRL (GAUZE/BANDAGES/DRESSINGS) ×1 IMPLANT
GLOVE BIO SURGEON STRL SZ7.5 (GLOVE) ×1 IMPLANT
GLOVE BIOGEL PI IND STRL 8 (GLOVE) ×1 IMPLANT
GLOVE PI ORTHO PRO STRL 7.5 (GLOVE) ×1 IMPLANT
GOWN STRL REUS W/ TWL XL LVL3 (GOWN DISPOSABLE) ×4 IMPLANT
GOWN STRL REUS W/TWL XL LVL3 (GOWN DISPOSABLE) ×4
HANDLE SUCTION POOLE (INSTRUMENTS) IMPLANT
IRRIG SUCT STRYKERFLOW 2 WTIP (MISCELLANEOUS)
IRRIGATION SUCT STRKRFLW 2 WTP (MISCELLANEOUS) IMPLANT
KIT BASIN OR (CUSTOM PROCEDURE TRAY) ×1 IMPLANT
KIT TURNOVER KIT A (KITS) IMPLANT
LEGGING LITHOTOMY PAIR STRL (DRAPES) IMPLANT
RETRACTOR WND ALEXIS 18 MED (MISCELLANEOUS) IMPLANT
RTRCTR WOUND ALEXIS 18CM MED (MISCELLANEOUS)
SCISSORS LAP 5X35 DISP (ENDOMECHANICALS) ×1 IMPLANT
SHEARS HARMONIC ACE PLUS 36CM (ENDOMECHANICALS) IMPLANT
SLEEVE Z-THREAD 5X100MM (TROCAR) ×1 IMPLANT
SPIKE FLUID TRANSFER (MISCELLANEOUS) ×1 IMPLANT
STAPLER VISISTAT 35W (STAPLE) IMPLANT
STRIP CLOSURE SKIN 1/2X4 (GAUZE/BANDAGES/DRESSINGS) IMPLANT
SUCTION POOLE HANDLE (INSTRUMENTS)
SUT PDS AB 1 TP1 96 (SUTURE) IMPLANT
SUT PROLENE 2 0 KS (SUTURE) IMPLANT
SUT PROLENE 2 0 SH DA (SUTURE) IMPLANT
SUT SILK 2 0 (SUTURE)
SUT SILK 2 0 SH CR/8 (SUTURE) IMPLANT
SUT SILK 2-0 18XBRD TIE 12 (SUTURE) IMPLANT
SUT SILK 3 0 (SUTURE)
SUT SILK 3 0 SH CR/8 (SUTURE) IMPLANT
SUT SILK 3-0 18XBRD TIE 12 (SUTURE) IMPLANT
SYR BULB IRRIG 60ML STRL (SYRINGE) IMPLANT
SYS LAPSCP GELPORT 120MM (MISCELLANEOUS)
SYS RETRIEVAL 5MM INZII UNIV (BASKET) ×1
SYSTEM LAPSCP GELPORT 120MM (MISCELLANEOUS) IMPLANT
SYSTEM RETRIEVL 5MM INZII UNIV (BASKET) IMPLANT
TOWEL OR 17X26 10 PK STRL BLUE (TOWEL DISPOSABLE) ×1 IMPLANT
TOWEL OR NON WOVEN STRL DISP B (DISPOSABLE) ×1 IMPLANT
TRAY FOLEY MTR SLVR 16FR STAT (SET/KITS/TRAYS/PACK) ×1 IMPLANT
TRAY LAPAROSCOPIC (CUSTOM PROCEDURE TRAY) ×1 IMPLANT
TROCAR 11X100 Z THREAD (TROCAR) IMPLANT
TROCAR Z-THREAD OPTICAL 5X100M (TROCAR) ×1 IMPLANT
TUBE GASTRO BOLUS 18FR ENFIT (TUBING) IMPLANT
YANKAUER SUCT BULB TIP NO VENT (SUCTIONS) IMPLANT

## 2022-04-14 NOTE — Anesthesia Preprocedure Evaluation (Signed)
Anesthesia Evaluation  Patient identified by MRN, date of birth, ID band Patient awake  General Assessment Comment:[name] is a 67 y.o. female with medical history significant of breast cancer and HLD presenting with abdominal pain.  She was last admitted from 1/31-2/2 for high-grade SBO that was treated conservatively.   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   Reviewed: Allergy & Precautions, H&P , NPO status , Patient's Chart, lab work & pertinent test results  History of Anesthesia Complications (+) PONV and history of anesthetic complications  Airway Mallampati: II  TM Distance: >3 FB Neck ROM: Full    Dental no notable dental hx.    Pulmonary neg pulmonary ROS   Pulmonary exam normal breath sounds clear to auscultation       Cardiovascular negative cardio ROS Normal cardiovascular exam Rhythm:Regular Rate:Normal     Neuro/Psych negative neurological ROS  negative psych ROS   GI/Hepatic negative GI ROS, Neg liver ROS,,,  Endo/Other  negative endocrine ROS    Renal/GU negative Renal ROS  negative genitourinary   Musculoskeletal negative musculoskeletal ROS (+)    Abdominal   Peds negative pediatric ROS (+)  Hematology negative hematology ROS (+)   Anesthesia Other Findings   Reproductive/Obstetrics negative OB ROS                             Anesthesia Physical Anesthesia Plan  ASA: 3  Anesthesia Plan: General   Post-op Pain Management: Ofirmev IV (intra-op)*   Induction: Intravenous and Rapid sequence  PONV Risk Score and Plan: 4 or greater and Ondansetron, Dexamethasone, Scopolamine patch - Pre-op, Treatment may vary due to age or medical condition and Droperidol  Airway Management Planned: Oral ETT  Additional Equipment:   Intra-op Plan:    Post-operative Plan: Extubation in OR  Informed Consent: I have reviewed the patients History and Physical, chart, labs and discussed the procedure including the risks, benefits and alternatives for the proposed anesthesia with the patient or authorized representative who has indicated his/her understanding and acceptance.     Dental advisory given  Plan Discussed with: CRNA and Surgeon  Anesthesia Plan Comments:        Anesthesia Quick Evaluation

## 2022-04-14 NOTE — Anesthesia Postprocedure Evaluation (Signed)
Anesthesia Post Note  Patient: Jenin Bonton  Procedure(s) Performed: LAPAROSCOPY DIAGNOSTIC, G-TUBE PLACEMENT; OMENTUM BIOPSY; LYSIS OF ADHESIONS     Patient location during evaluation: PACU Anesthesia Type: General Level of consciousness: awake and alert Pain management: pain level controlled Vital Signs Assessment: post-procedure vital signs reviewed and stable Respiratory status: spontaneous breathing, nonlabored ventilation, respiratory function stable and patient connected to nasal cannula oxygen Cardiovascular status: blood pressure returned to baseline and stable Postop Assessment: no apparent nausea or vomiting Anesthetic complications: no  No notable events documented.  Last Vitals:  Vitals:   04/14/22 1433 04/14/22 1445  BP: 136/85 114/76  Pulse: 68 71  Resp: 19 13  Temp: 36.7 C   SpO2: 100% 100%    Last Pain:  Vitals:   04/14/22 1433  TempSrc:   PainSc: 0-No pain                 Callie Facey S

## 2022-04-14 NOTE — Anesthesia Procedure Notes (Signed)
Procedure Name: Intubation Date/Time: 04/14/2022 12:51 PM  Performed by: Jenne Campus, CRNAPre-anesthesia Checklist: Patient identified, Emergency Drugs available, Suction available and Patient being monitored Patient Re-evaluated:Patient Re-evaluated prior to induction Oxygen Delivery Method: Circle System Utilized Preoxygenation: Pre-oxygenation with 100% oxygen Induction Type: IV induction and Rapid sequence Laryngoscope Size: Mac and 4 Grade View: Grade III Tube type: Oral Tube size: 7.0 mm Number of attempts: 1 Airway Equipment and Method: Stylet and Oral airway Placement Confirmation: ETT inserted through vocal cords under direct vision, positive ETCO2 and breath sounds checked- equal and bilateral Secured at: 21 cm Tube secured with: Tape Dental Injury: Teeth and Oropharynx as per pre-operative assessment

## 2022-04-14 NOTE — Progress Notes (Signed)
Dr. Thermon Leyland saw the patient earlier and please refer to his note.  I saw the patient after him when her husband arrived.  We further discussed the findings and expectations of what we can offer surgical and reiterated the conversation Dr. Lanny Hurst just had with the patient to the husband.  They were very appreciative and are awaiting oncology evaluation and hopes to move forward with surgery later today or tomorrow.  Henreitta Cea 9:57 AM 04/14/2022

## 2022-04-14 NOTE — Transfer of Care (Signed)
Immediate Anesthesia Transfer of Care Note  Patient: [name]  Procedure(s) Performed: LAPAROSCOPY DIAGNOSTIC, G-TUBE PLACEMENT; OMENTUM BIOPSY; LYSIS OF ADHESIONS  Patient Location: PACU  Anesthesia Type:General  Level of Consciousness: oriented, sedated, and patient cooperative  Airway & Oxygen Therapy: Patient Spontanous Breathing and Patient connected to face mask oxygen  Post-op Assessment: Report given to RN and Post -op Vital signs reviewed and stable  Post vital signs: Reviewed  Last Vitals:  Vitals Value Taken Time  BP 136/85 04/14/22 1433  Temp    Pulse 66 04/14/22 1436  Resp 13 04/14/22 1437  SpO2 100 % 04/14/22 1436  Vitals shown include unvalidated device data.  Last Pain:  Vitals:   04/14/22 1136  TempSrc:   PainSc: 0-No pain      Patients Stated Pain Goal: 2 ([id number])  Complications: No notable events documented.

## 2022-04-14 NOTE — Progress Notes (Signed)
PROGRESS NOTE    [name]  [id number] DOB: 04/02/1955 DOA: 04/13/2022 PCP: Chesley Noon, MD    Chief Complaint  Patient presents with   Abdominal Pain    Brief Narrative:  Patient is a pleasant 67 year old female history of breast cancer, hyperlipidemia presented to the ED with abdominal pain.  Patient noted to be last hospitalized 03/31/2022-04/02/2022 for high-grade SBO which was treated conservatively. Patient noted to have undergone EGD with enteroscopy at [facility] on 04/08/2022 with biopsies returned on day of admission with poorly differentiated carcinoma likely metastatic from the breast.  Patient felt fine and 2 days postprocedure started to develop worsening abdominal pain and presented to the ED and hoping to be admitted and evaluated by general surgery and oncology here.  Patient seen by general surgery patient underwent laparoscopic G-tube placement, omental biopsy, lysis of adhesions, diagnostic laparoscopy per general surgery 04/14/2022 without any complications.   Assessment & Plan:   Principal Problem:   SBO (small bowel obstruction) (HCC) Active Problems:   Hyperlipemia   Ductal carcinoma in situ (DCIS) of right breast  #1 small bowel obstruction -Patient presented with a small bowel obstruction noted on CT abdomen and pelvis. -Concern for malignant small bowel obstruction as patient with prior history of remote breast cancer, seen at [facility] following recent admission hide EGD with enteroscopy with biopsies concerning for poorly differentiated carcinoma possibly from breast cancer. -Patient seen by general surgery, initially placed on clear liquids, subsequently made n.p.o. and underwent laparoscopic G-tube placement, omental biopsy, lysis of adhesions, diagnostic laparoscopy to date 04/14/2022 per general surgery. -Continue IV fluids, pain management. -Currently on clear liquids per general surgery. -Diet advancement and further management per general  surgery. -Oncology consultation requested due to concern for malignant small bowel obstruction.  2.  Metastatic breast cancer -Due to recent EGD and enteroscopy findings concern for malignant breast cancer and may need chemotherapy postop. -Patient noted to have been followed by Dr. Jana Hakim in the past and had requested admitting physician to be seen by oncology during this hospitalization. -Oncology consultation pending with Dr.Kale.  3.  Hyperlipidemia -Continue to hold medications and may resume postdischarge.    DVT prophylaxis: Postop DVT prophylaxis per general surgery. Code Status: Full Family Communication: Updated patient, updated husband at bedside. Disposition: Likely home when clinically improved, cleared by general surgery.  Status is: Inpatient Remains inpatient appropriate because: Severity of illness   Consultants:  General surgery: Dr. Thermon Leyland  Procedures:  CT head 04/13/2022 CT chest 04/13/2022 CT abdomen and pelvis 04/13/2022 Laparoscopic G-tube placement, omental biopsy, lysis of adhesions, diagnostic laparoscopy per Dr. Thermon Leyland General surgery 04/14/2022  Antimicrobials:  Anti-infectives (From admission, onward)    Start     Dose/Rate Route Frequency Ordered Stop   04/14/22 1100  cefOXitin (MEFOXIN) 2 g in sodium chloride 0.9 % 100 mL IVPB        2 g 200 mL/hr over 30 Minutes Intravenous On call to O.R. 04/14/22 1004 04/14/22 1253         Subjective: Patient sitting up in bed.  Alert awake.  Denies any chest pain.  No shortness of breath.  Complaining of some abdominal discomfort postoperatively.  Husband at bedside.  States was passing gas preoperatively.  Has not passed any flatus postop.  Objective: Vitals:   04/14/22 1500 04/14/22 1515 04/14/22 1530 04/14/22 1554  BP: 117/80 125/77 (!) 131/93 (!) 143/97  Pulse: 77 79 78 79  Resp: 18 14 20 16  $ Temp:  97.8 F (36.6 C)  TempSrc:    Oral  SpO2: 97% 96% 99% 96%  Weight:      Height:         Intake/Output Summary (Last 24 hours) at 04/14/2022 1641 Last data filed at 04/14/2022 1559 Gross per 24 hour  Intake 3691.48 ml  Output 1120 ml  Net 2571.48 ml   Filed Weights   04/13/22 1014  Weight: 67.1 kg    Examination:  General exam: Appears calm and comfortable  Respiratory system: Clear to auscultation anterior lung fields bilaterally.  No wheezes, no crackles, no rhonchi.  Fair air movement.  Speaking in full sentences.Marland Kitchen Respiratory effort normal. Cardiovascular system: S1 & S2 heard, RRR. No JVD, murmurs, rubs, gallops or clicks. No pedal edema. Gastrointestinal system: Abdomen is nondistended, soft and nontender.  Positive bowel sounds.  Mid abdominal dressing noted with venting gastrostomy tube.  No rebound.  No guarding. Central nervous system: Alert and oriented. No focal neurological deficits. Extremities: Symmetric 5 x 5 power. Skin: No rashes, lesions or ulcers Psychiatry: Judgement and insight appear normal. Mood & affect appropriate.     Data Reviewed: I have personally reviewed following labs and imaging studies  CBC: Recent Labs  Lab 04/13/22 1016 04/14/22 0436  WBC 6.0 6.4  NEUTROABS 4.2  --   HGB 13.9 12.2  HCT 42.3 37.4  MCV 97.7 98.2  PLT 258 0000000    Basic Metabolic Panel: Recent Labs  Lab 04/13/22 1016 04/14/22 0436  NA 138 138  K 3.9 3.6  CL 104 104  CO2 27 23  GLUCOSE 107* 122*  BUN 9 8  CREATININE 0.61 0.75  CALCIUM 9.0 8.7*    GFR: Estimated Creatinine Clearance: 67.3 mL/min (by C-G formula based on SCr of 0.75 mg/dL).  Liver Function Tests: Recent Labs  Lab 04/13/22 1016  AST 25  ALT 19  ALKPHOS 62  BILITOT 1.1  PROT 6.9  ALBUMIN 4.0    CBG: No results for input(s): "GLUCAP" in the last 168 hours.   No results found for this or any previous visit (from the past 240 hour(s)).       Radiology Studies: CT Abdomen Pelvis W Contrast  Result Date: 04/13/2022 CLINICAL DATA:  Abdominal pain, concern for  bowel obstruction. Breast cancer, metastatic disease evaluation. * Tracking Code: BO * EXAM: CT CHEST, ABDOMEN, AND PELVIS WITH CONTRAST TECHNIQUE: Multidetector CT imaging of the chest, abdomen and pelvis was performed following the standard protocol during bolus administration of intravenous contrast. RADIATION DOSE REDUCTION: This exam was performed according to the departmental dose-optimization program which includes automated exposure control, adjustment of the mA and/or kV according to patient size and/or use of iterative reconstruction technique. CONTRAST:  138m OMNIPAQUE IOHEXOL 300 MG/ML  SOLN COMPARISON:  CT abdomen pelvis March 31, 2022. FINDINGS: CT CHEST FINDINGS Cardiovascular: Normal caliber thoracic aorta. Aortic atherosclerosis. Normal caliber central pulmonary arteries. Normal size heart. No significant pericardial effusion/thickening. Mediastinum/Nodes: No suspicious thyroid nodule. No pathologically enlarged mediastinal, hilar or axillary lymph nodes. Small amount of crescentic feathery soft tissue stranding in the anterior mediastinum for instance on image 25/2. Prior right axillary lymph node dissection. Lungs/Pleura: No aggressive lytic or blastic lesion of bone. Biapical pleuroparenchymal scarring. Left lower lobe pneumatocele. No pleural effusion. No pneumothorax. Musculoskeletal: Prior bilateral mastectomies with breast reconstruction and right axillary lymph node dissection. Sclerotic foci in the left humeral head on image 6/2 as well as on image 9/2. Sclerotic focus in the inferior endplate of T6, with  associated endplate degenerative change. CT ABDOMEN PELVIS FINDINGS Hepatobiliary: Hypodense hepatic lesions measure fluid density compatible with cysts. Additional hypodense hepatic lesions are technically too small to accurately characterize but stable from prior examination and also favored to reflect cysts. No solid enhancing hepatic lesion identified. Gallbladder is unremarkable.  No biliary ductal dilation. Pancreas: No pancreatic ductal dilation or evidence of acute inflammation. Spleen: No splenomegaly. Adrenals/Urinary Tract: Bilateral adrenal glands appear normal. No hydronephrosis. Kidneys demonstrate symmetric enhancement. Urinary bladder is unremarkable for degree of distension Stomach/Bowel: Worsening dilation of small bowel loops measuring up to 4.3 cm in diameter on image 81/2 with abrupt transition to decompressed bowel on image 87/2 near the site of the mucosal hyperenhancement seen on CT enterography December 09, 2021. Distal to this there is another prominent fluid-filled loop of bowel in the pelvis measuring 2.2 cm also with abrupt transition to decompressed bowel in area of bowel wall thickening with adjacent pelvic free fluid. Additionally there are multifocal short and moderate length segments of small bowel wall thickening with adjacent mesenteric edema/stranding for instance in the left hemiabdomen on image 75/2. Normal appendix. Colon is predominately decompressed limiting evaluation. Vascular/Lymphatic: Aortic atherosclerosis. Normal caliber abdominal aorta. Smooth IVC contours. No pathologically enlarged abdominal or pelvic lymph nodes. Prominent ileocolic lymph nodes measure up to 6 mm in short axis on image 82/2 Reproductive: Uterus and bilateral adnexa are unremarkable. Other: Small volume mesenteric and pelvic free fluid. Musculoskeletal: Sclerotic focus in the right femoral head on image 67/5. Multilevel Schmorl's node formation with associated degenerative endplate changes. Lumbar spondylosis. Grade 1 L4 on L5 degenerative anterolisthesis. IMPRESSION: 1. Worsening long segment of dilated fluid-filled small bowel with transition point in the right hemiabdomen similar in location to the area of mucosal hyperenhancement seen on prior imaging, compatible with worsening small bowel obstruction possibly related to an inflammatory or malignant stricture or adhesion. 2.  Additionally distal to this there is a prominent fluid-filled loop of small bowel with abrupt transition to decompressed bowel with apparent wall thickening of this bowel similar to the more proximal section. Also there are multifocal short and moderate length segments of small bowel wall thickening with adjacent mesenteric edema/stranding, complex findings which may reflect multifocal disease from the above process, NSAID enteritis or superimposed infectious/inflammatory enteritis. 3. Prominent ileocolic lymph nodes measuring up to 6 mm in short axis, nonspecific but favored reactive. 4. Sclerotic foci in the left humeral head, T6 vertebral body and right femoral head are nonspecific but favored to reflect bone islands. Consider attention on nuclear medicine bone scan. 5. Small amount of crescentic feathery soft tissue stranding in the anterior mediastinum, nonspecific but favored to reflect thymic hyperplasia. 6. Prior bilateral mastectomies with breast reconstruction and right axillary lymph node dissection. 7. Hypodense hepatic lesions measure fluid density compatible with cysts. Additional hypodense hepatic lesions are technically too small to accurately characterize but also favored to reflect cysts. Attention on follow-up imaging suggested. 8.  Aortic Atherosclerosis (ICD10-I70.0). Electronically Signed   By: Dahlia Bailiff M.D.   On: 04/13/2022 16:08   CT Chest W Contrast  Result Date: 04/13/2022 CLINICAL DATA:  Abdominal pain, concern for bowel obstruction. Breast cancer, metastatic disease evaluation. * Tracking Code: BO * EXAM: CT CHEST, ABDOMEN, AND PELVIS WITH CONTRAST TECHNIQUE: Multidetector CT imaging of the chest, abdomen and pelvis was performed following the standard protocol during bolus administration of intravenous contrast. RADIATION DOSE REDUCTION: This exam was performed according to the departmental dose-optimization program which includes automated exposure control, adjustment of  the  mA and/or kV according to patient size and/or use of iterative reconstruction technique. CONTRAST:  12m OMNIPAQUE IOHEXOL 300 MG/ML  SOLN COMPARISON:  CT abdomen pelvis March 31, 2022. FINDINGS: CT CHEST FINDINGS Cardiovascular: Normal caliber thoracic aorta. Aortic atherosclerosis. Normal caliber central pulmonary arteries. Normal size heart. No significant pericardial effusion/thickening. Mediastinum/Nodes: No suspicious thyroid nodule. No pathologically enlarged mediastinal, hilar or axillary lymph nodes. Small amount of crescentic feathery soft tissue stranding in the anterior mediastinum for instance on image 25/2. Prior right axillary lymph node dissection. Lungs/Pleura: No aggressive lytic or blastic lesion of bone. Biapical pleuroparenchymal scarring. Left lower lobe pneumatocele. No pleural effusion. No pneumothorax. Musculoskeletal: Prior bilateral mastectomies with breast reconstruction and right axillary lymph node dissection. Sclerotic foci in the left humeral head on image 6/2 as well as on image 9/2. Sclerotic focus in the inferior endplate of T6, with associated endplate degenerative change. CT ABDOMEN PELVIS FINDINGS Hepatobiliary: Hypodense hepatic lesions measure fluid density compatible with cysts. Additional hypodense hepatic lesions are technically too small to accurately characterize but stable from prior examination and also favored to reflect cysts. No solid enhancing hepatic lesion identified. Gallbladder is unremarkable. No biliary ductal dilation. Pancreas: No pancreatic ductal dilation or evidence of acute inflammation. Spleen: No splenomegaly. Adrenals/Urinary Tract: Bilateral adrenal glands appear normal. No hydronephrosis. Kidneys demonstrate symmetric enhancement. Urinary bladder is unremarkable for degree of distension Stomach/Bowel: Worsening dilation of small bowel loops measuring up to 4.3 cm in diameter on image 81/2 with abrupt transition to decompressed bowel on image 87/2  near the site of the mucosal hyperenhancement seen on CT enterography December 09, 2021. Distal to this there is another prominent fluid-filled loop of bowel in the pelvis measuring 2.2 cm also with abrupt transition to decompressed bowel in area of bowel wall thickening with adjacent pelvic free fluid. Additionally there are multifocal short and moderate length segments of small bowel wall thickening with adjacent mesenteric edema/stranding for instance in the left hemiabdomen on image 75/2. Normal appendix. Colon is predominately decompressed limiting evaluation. Vascular/Lymphatic: Aortic atherosclerosis. Normal caliber abdominal aorta. Smooth IVC contours. No pathologically enlarged abdominal or pelvic lymph nodes. Prominent ileocolic lymph nodes measure up to 6 mm in short axis on image 82/2 Reproductive: Uterus and bilateral adnexa are unremarkable. Other: Small volume mesenteric and pelvic free fluid. Musculoskeletal: Sclerotic focus in the right femoral head on image 67/5. Multilevel Schmorl's node formation with associated degenerative endplate changes. Lumbar spondylosis. Grade 1 L4 on L5 degenerative anterolisthesis. IMPRESSION: 1. Worsening long segment of dilated fluid-filled small bowel with transition point in the right hemiabdomen similar in location to the area of mucosal hyperenhancement seen on prior imaging, compatible with worsening small bowel obstruction possibly related to an inflammatory or malignant stricture or adhesion. 2. Additionally distal to this there is a prominent fluid-filled loop of small bowel with abrupt transition to decompressed bowel with apparent wall thickening of this bowel similar to the more proximal section. Also there are multifocal short and moderate length segments of small bowel wall thickening with adjacent mesenteric edema/stranding, complex findings which may reflect multifocal disease from the above process, NSAID enteritis or superimposed  infectious/inflammatory enteritis. 3. Prominent ileocolic lymph nodes measuring up to 6 mm in short axis, nonspecific but favored reactive. 4. Sclerotic foci in the left humeral head, T6 vertebral body and right femoral head are nonspecific but favored to reflect bone islands. Consider attention on nuclear medicine bone scan. 5. Small amount of crescentic feathery soft tissue stranding in the anterior  mediastinum, nonspecific but favored to reflect thymic hyperplasia. 6. Prior bilateral mastectomies with breast reconstruction and right axillary lymph node dissection. 7. Hypodense hepatic lesions measure fluid density compatible with cysts. Additional hypodense hepatic lesions are technically too small to accurately characterize but also favored to reflect cysts. Attention on follow-up imaging suggested. 8.  Aortic Atherosclerosis (ICD10-I70.0). Electronically Signed   By: Dahlia Bailiff M.D.   On: 04/13/2022 16:08   CT Head Wo Contrast  Result Date: 04/13/2022 CLINICAL DATA:  Metastatic disease evaluation. EXAM: CT HEAD WITHOUT CONTRAST TECHNIQUE: Contiguous axial images were obtained from the base of the skull through the vertex without intravenous contrast. RADIATION DOSE REDUCTION: This exam was performed according to the departmental dose-optimization program which includes automated exposure control, adjustment of the mA and/or kV according to patient size and/or use of iterative reconstruction technique. COMPARISON:  None Available. FINDINGS: Brain: No acute hemorrhage, mass effect or midline shift. Gray-white differentiation is preserved. No hydrocephalus. No extra-axial collection. Basilar cisterns are patent. Vascular: No hyperdense vessel or unexpected calcification. Skull: No calvarial fracture or suspicious bone lesion. Skull base is unremarkable. Sinuses/Orbits: Unremarkable. Other: None. IMPRESSION: No acute intracranial abnormality. Electronically Signed   By: Emmit Alexanders M.D.   On: 04/13/2022  15:25        Scheduled Meds:  cycloSPORINE  1 drop Both Eyes BID   scopolamine       Continuous Infusions:  lactated ringers 125 mL/hr at 04/14/22 1620     LOS: 1 day    Time spent: 35 minutes    Irine Seal, MD [facility]   To contact the attending provider between 7A-7P or the covering provider during after hours 7P-7A, please log into the web site [email or web address] and access using universal [place] password for that web site. If you do not have the password, please call the hospital operator.  04/14/2022, 4:41 PM

## 2022-04-14 NOTE — Progress Notes (Signed)
Progress Note: General Surgery Service   Chief Complaint/Subjective: Nausea, abdominal pain with eating and drinking.  Feeling okay after being NPO overnight.  Had some bowel movements.  Objective: Vital signs in last 24 hours: Temp:  [98.2 F (36.8 C)-98.9 F (37.2 C)] 98.9 F (37.2 C) (02/14 0545) Pulse Rate:  [57-93] 82 (02/14 0545) Resp:  [16-18] 18 (02/14 0545) BP: (100-134)/(67-98) 100/67 (02/14 0545) SpO2:  [95 %-100 %] 95 % (02/14 0545) Last BM Date : 04/14/22  Intake/Output from previous day: 02/13 0701 - 02/14 0700 In: 2005.7 [P.O.:120; I.V.:885.7; IV Piggyback:1000] Out: 600 [Emesis/NG output:600] Intake/Output this shift: No intake/output data recorded.  Constitutional: NAD; conversant; no deformities Eyes: Moist conjunctiva; no lid lag; anicteric; PERRL Neck: Trachea midline; no thyromegaly Lungs: Normal respiratory effort; no tactile fremitus CV: RRR; no palpable thrills; no pitting edema GI: Abd Distended, some tenderness; no palpable hepatosplenomegaly MSK: Normal range of motion of extremities; no clubbing/cyanosis Psychiatric: Appropriate affect; alert and oriented x3 Lymphatic: No palpable cervical or axillary lymphadenopathy  Lab Results: CBC  Recent Labs    04/13/22 1016 04/14/22 0436  WBC 6.0 6.4  HGB 13.9 12.2  HCT 42.3 37.4  PLT 258 249   BMET Recent Labs    04/13/22 1016 04/14/22 0436  NA 138 138  K 3.9 3.6  CL 104 104  CO2 27 23  GLUCOSE 107* 122*  BUN 9 8  CREATININE 0.61 0.75  CALCIUM 9.0 8.7*   PT/INR No results for input(s): "LABPROT", "INR" in the last 72 hours. ABG No results for input(s): "PHART", "HCO3" in the last 72 hours.  Invalid input(s): "PCO2", "PO2"  Anti-infectives: Anti-infectives (From admission, onward)    Start     Dose/Rate Route Frequency Ordered Stop   04/14/22 1100  cefOXitin (MEFOXIN) 2 g in sodium chloride 0.9 % 100 mL IVPB        2 g 200 mL/hr over 30 Minutes Intravenous On call to O.R.  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  I offered the patient diagnostic laparoscopy, lysis of adhesions, with venting gastrostomy tube placement.  We discussed the procedure, its risks, benefits and alternatives.  We discussed the risk of infection, bleeding, damage to nearby structures.  We discussed the risk of possible laparotomy, possible bowel resection.  After a full discussion and all questions answered the patient granted consent to proceed.  We will proceed as scheduled.   LOS: 1 day    Felicie Morn, [facility] Use [email or web address] to contact on call provider  Daily Billing: [phone] - High MDM

## 2022-04-14 NOTE — Op Note (Signed)
Patient: [name] ([birth year], [id number])  Date of Surgery: 04/14/22  Preoperative Diagnosis: MALIGNANT BOWEL OBSTRUCTION  Postoperative Diagnosis: MALIGNANT BOWEL OBSTRUCTION  Surgical Procedure:  LAPAROSCOPIC G-TUBE PLACEMENT; OMENTUM BIOPSY; LYSIS OF ADHESIONS, DIAGNOSTIC LAPAROSCOPY  Operative Team Members:  Surgeon(s) and Role:    * Beatriz Quintela, Nickola Major, MD - Primary   Anesthesiologist: Myrtie Soman, MD CRNA: Jenne Campus, CRNA; Renato Shin, CRNA   Anesthesia: General   Fluids:  Total I/O In: 1050 [I.V.:850; IV Piggyback:200] Out: 20 [id number]  Complications: None  Drains:  none   Specimen:  ID Type Source Tests Collected by Time Destination  1 : Omentum Biopsy Tissue PATH GI biopsy SURGICAL PATHOLOGY Virgina Deakins, Nickola Major, MD 04/14/2022 1359   A : ascitic fluid Body Fluid PATH Cytology Peritoneal fluid CYTOLOGY - NON PAP Edelmira Gallogly, Nickola Major, MD 04/14/2022 1314      Disposition:  PACU - hemodynamically stable.  Plan of Care:  Continue inpatient care    Indications for Procedure: [name] is a 67 y.o. female who presented with a malignant bowel obstruction.  She had early stage breast cancer treated with surgery years ago, and recently has been trying to figure out what has been causing some abdominal discomfort.  Finally on double-balloon endoscopy at [place] last week, a biopsy returned positive for a malignant obstruction.  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  We discussed the poor prognosis of this diagnosis.  We discussed the surgical options I could provide her.  I recommended a diagnostic laparoscopy with laparoscopic gastrostomy tube placement.  I explained I would run the intestine and try to divide any adhesions that might be contributing to her symptoms but I would avoid doing more surgery like a bowel resection unless the disease appeared very localized.  The procedure itself as  well as its risks, benefits and alternatives were discussed.  The risks discussed included but were not limited to the risk of infection, bleeding, damage to nearby structures, and continuation of preoperative symptoms.  After a full discussion and all questions answered the patient granted consent to proceed.  Findings:  Endoscopic tattoo ink found throughout the abdomen Small amount of ascites, sent for cytology Two small bowel masses:  - One about 20 cm from the ligament of treitz, tattoo on antimesenteric border proximal and distal to this area consistent with recent endoscopic biopsy. Did not appear obstructing. - One approximately 100 cm from the terminal ileum, bowel turned back on itself in this area and a band of omentum stuck to the bowel which was divided.  Not sure if the band or the mass was causing obstruction but the bowel proximal to this area was dilated and the bowel distal appeared normal.  Findings pictured in media tab  Description of Procedure:   On the date stated above the patient was taken the operating room suite and placed in supine position.  General endotracheal anesthesia was induced.  A timeout was completed verifying the correct patient, procedure, position, and equipment needed for the case.  Preoperative antibiotics were given.  I began by making a 5 mm incision at the umbilicus.  The Veress needle was used to insufflate the abdomen.  A 5 mm trocar was placed with no trauma to the underlying viscera.  2 additional trocars were placed one in the right upper quadrant one in the left lower quadrant.  I inspected the abdomen.  Ascites was encountered and sent off the  field for cytology.  There was tattoo material throughout the abdomen from the recent endoscopic biopsy.  I ran the bowel from the terminal ileum to the ligament of Treitz.  In the mid small bowel may be 100 cm from the terminal ileum, there was an area consistent with a metastatic mass in the small bowel.   There was a band adhesion to the omentum to this mass.  That adhesion was divided.  There was dilation of the bowel proximal to this area and normal-appearing bowel distal this area, I feel this was her main area of obstruction.  I am not sure if the obstruction was from the mass itself or from the adhesion that was lysed.  I continue to run the bowel proximally to the ligament of Treitz.  20 cm or so distal to the ligament of Treitz I encountered tattoo material and the mass that had been biopsied endoscopically.  This mass did not appear to be a point of obstruction.  The omentum was inspected and a omental biopsy was performed.  I did not see any clear abnormalities in the omentum, however there was what appeared to be endoscopic tattoo ink included in this portion of the specimen.  The liver was inspected and there were no abnormalities.  The gynecologic organs were inspected and appeared normal.  At this point we performed a laparoscopic G-tube placement.  A 2- 0 silk suture was used to create a pursestring on the anterior gastric wall in the mid stomach.  Four 2-0 silk sutures were brought through the abdominal wall using a PDS suture passer and sutured to the stomach in each cardinal direction around this pursestring.  A 5 mm trocar was inserted through the abdominal wall and into the stomach in the middle of the pursestring.  The harmonic scalpel was used to create a gastrotomy to do this.  The trocar was then removed and replaced with a 18 French gastrostomy tube.  The balloon was tested and then inserted into the stomach and inflated to 10 mL.  The balloon was snugged up to the abdominal wall.  The transfascial sutures were tied down in each quadrant.  The abdomen was desufflated the ports were removed.  All sponge and needle counts were correct at the end of this case.  At the end of the case we reviewed the infection status of the case. Patient: [name] Emergency General Surgery Service  Patient Case: Urgent Infection Present At Time Of Surgery (PATOS): None  Louanna Raw, MD General, Bariatric, & Minimally Invasive Surgery [facility], [place]

## 2022-04-14 NOTE — Progress Notes (Signed)
Patient is c/o nausea, NP Olena Heckle was notified.

## 2022-04-15 ENCOUNTER — Telehealth: Payer: Self-pay | Admitting: *Deleted

## 2022-04-15 ENCOUNTER — Encounter (HOSPITAL_COMMUNITY): Payer: Self-pay | Admitting: Surgery

## 2022-04-15 DIAGNOSIS — D0511 Intraductal carcinoma in situ of right breast: Secondary | ICD-10-CM | POA: Diagnosis not present

## 2022-04-15 DIAGNOSIS — K56609 Unspecified intestinal obstruction, unspecified as to partial versus complete obstruction: Secondary | ICD-10-CM | POA: Diagnosis not present

## 2022-04-15 DIAGNOSIS — E785 Hyperlipidemia, unspecified: Secondary | ICD-10-CM | POA: Diagnosis not present

## 2022-04-15 LAB — MAGNESIUM: Magnesium: 1.8 mg/dL (ref 1.7–2.4)

## 2022-04-15 LAB — BASIC METABOLIC PANEL
Anion gap: 10 (ref 5–15)
BUN: 5 mg/dL — ABNORMAL LOW (ref 8–23)
CO2: 25 mmol/L (ref 22–32)
Calcium: 8.6 mg/dL — ABNORMAL LOW (ref 8.9–10.3)
Chloride: 105 mmol/L (ref 98–111)
Creatinine, Ser: 0.63 mg/dL (ref 0.44–1.00)
GFR, Estimated: >60 mL/min (ref >60)
Glucose, Bld: 112 mg/dL — ABNORMAL HIGH (ref 70–99)
Potassium: 3.8 mmol/L (ref 3.5–5.1)
Sodium: 140 mmol/L (ref 135–145)

## 2022-04-15 LAB — CBC
HCT: 35.1 % — ABNORMAL LOW (ref 36.0–46.0)
Hemoglobin: 11.4 g/dL — ABNORMAL LOW (ref 12.0–15.0)
MCH: 31.9 pg (ref 26.0–34.0)
MCHC: 32.5 g/dL (ref 30.0–36.0)
MCV: 98.3 fL (ref 80.0–100.0)
Platelets: 212 10*3/uL (ref 150–400)
RBC: 3.57 MIL/uL — ABNORMAL LOW (ref 3.87–5.11)
RDW: 12 % (ref 11.5–15.5)
WBC: 5.6 10*3/uL (ref 4.0–10.5)
nRBC: 0 % (ref 0.0–0.2)

## 2022-04-15 LAB — SURGICAL PATHOLOGY

## 2022-04-15 LAB — PHOSPHORUS: Phosphorus: 3.7 mg/dL (ref 2.5–4.6)

## 2022-04-15 LAB — PREALBUMIN: Prealbumin: 11 mg/dL — ABNORMAL LOW (ref 18–38)

## 2022-04-15 MED ORDER — ENSURE MAX PROTEIN PO LIQD
11.0000 [oz_av] | Freq: Three times a day (TID) | ORAL | Status: DC
  Administered 2022-04-15 – 2022-04-16 (×4): 11 [oz_av] via ORAL

## 2022-04-15 NOTE — Progress Notes (Signed)
Nutrition Note  RD consulted for nutrition education regarding full liquid diet + shakes.  Patient with history of metastatic breast cancer. 2/14 s/p diagnostic laparoscopic, LOA, omental bx, laparoscopic G-tube placement . G-tube for venting purposes.  Surgery recommending pt discharge on full liquid diet. Pt recommended to consume at least 3 protein shakes (>20g protein) per day.  Reviewed full liquid diet with pt and suggested ways to meet nutrition goals. Provided "Full liquid" handout from [employer]. Recommended pt take a chewable MVI daily.  Estimated needs: >2000 kcals daily and 90-100g protein daily.  Pt verbalizes understanding of information provided. Expect good compliance.  Body mass index is 23.17 kg/m^2. Pt meets criteria for normal based on current BMI.  Current diet order is full liquid, patient is consuming approximately 100% of liquids at this time. Labs and medications reviewed. No further nutrition interventions warranted at this time.  If additional nutrition issues arise, please re-consult RD.  Clayton Bibles, MS, RD, LDN Inpatient Clinical Dietitian Contact information available via Amion

## 2022-04-15 NOTE — Progress Notes (Signed)
Mobility Specialist - Progress Note   04/15/22 0913  Mobility  Activity Ambulated independently in hallway  Level of Assistance Independent  Assistive Device None  Distance Ambulated (ft) 600 ft  Activity Response Tolerated well  Mobility Referral Yes  $Mobility charge 1 Mobility   Pt received in bed and agreeable to mobility. No complaints during session. Pt to bed after session with all needs met.    [facility]

## 2022-04-15 NOTE — Progress Notes (Signed)
PROGRESS NOTE    [name]  [id number] DOB: 10-29-1955 DOA: 04/13/2022 PCP: Chesley Noon, MD    Chief Complaint  Patient presents with   Abdominal Pain    Brief Narrative:  Patient is a pleasant 67 year old female history of breast cancer, hyperlipidemia presented to the ED with abdominal pain.  Patient noted to be last hospitalized 03/31/2022-04/02/2022 for high-grade SBO which was treated conservatively. Patient noted to have undergone EGD with enteroscopy at [facility] on 04/08/2022 with biopsies returned on day of admission with poorly differentiated carcinoma likely metastatic from the breast.  Patient felt fine and 2 days postprocedure started to develop worsening abdominal pain and presented to the ED and hoping to be admitted and evaluated by general surgery and oncology here.  Patient seen by general surgery patient underwent laparoscopic G-tube placement, omental biopsy, lysis of adhesions, diagnostic laparoscopy per general surgery 04/14/2022 without any complications.   Assessment & Plan:   Principal Problem:   SBO (small bowel obstruction) (HCC) Active Problems:   Hyperlipemia   Ductal carcinoma in situ (DCIS) of right breast  #1 small bowel obstruction -Patient presented with a small bowel obstruction noted on CT abdomen and pelvis. -Concern for malignant small bowel obstruction as patient with prior history of remote breast cancer, seen at [facility] following recent admission hide EGD with enteroscopy with biopsies concerning for poorly differentiated carcinoma possibly from breast cancer. -Patient seen by general surgery, initially placed on clear liquids, subsequently made n.p.o. and underwent laparoscopic G-tube placement, omental biopsy, lysis of adhesions, diagnostic laparoscopy on 04/14/2022 per general surgery. -Patient placed on clear liquid diet which she is tolerating, has been followed by general surgery and diet being advanced to full liquid diet plus  protein shakes with G-tube clamped today, G-tube for venting as needed. -Continue IV fluids, pain management. -Oncology consultation requested due to concern for malignant small bowel obstruction.  2.  Metastatic breast cancer -Due to recent EGD and enteroscopy findings concern for malignant breast cancer and may need chemotherapy postop. -Patient noted to have been followed by Dr. Jana Hakim in the past and had requested admitting physician to be seen by oncology during this hospitalization. -Oncology consultation pending with Dr.Iruku.  3.  Hyperlipidemia -Hold home regimen and resume on discharge.    DVT prophylaxis: Postop DVT prophylaxis per general surgery. Code Status: Full Family Communication: Updated patient, updated husband at bedside. Disposition: Likely home when clinically improved, cleared by general surgery.  Status is: Inpatient Remains inpatient appropriate because: Severity of illness   Consultants:  General surgery: Dr. Thermon Leyland  Procedures:  CT head 04/13/2022 CT chest 04/13/2022 CT abdomen and pelvis 04/13/2022 Laparoscopic G-tube placement, omental biopsy, lysis of adhesions, diagnostic laparoscopy per Dr. Thermon Leyland General surgery 04/14/2022  Antimicrobials:  Anti-infectives (From admission, onward)    Start     Dose/Rate Route Frequency Ordered Stop   04/14/22 1100  cefOXitin (MEFOXIN) 2 g in sodium chloride 0.9 % 100 mL IVPB        2 g 200 mL/hr over 30 Minutes Intravenous On call to O.R. 04/14/22 1004 04/14/22 1253         Subjective: Sitting up in bed.  Noted to have ambulated in hallway.  Feeling better.  No nausea or vomiting.  No chest pain.  No significant abdominal pain.  Patient does endorse flatus.  No bowel movement.  Tolerated clear liquids.  Husband at bedside.   Objective: Vitals:   04/15/22 0106 04/15/22 0436 04/15/22 1013 04/15/22 1218  BP:  102/70 107/76 106/70 121/87  Pulse: 75 61 61   Resp: 16 17 16 16  $ Temp: 98.8 F (37.1  C) 98.9 F (37.2 C) 98.2 F (36.8 C) (!) 97.5 F (36.4 C)  TempSrc: Oral Oral Oral Oral  SpO2: 96% 96% 92% 92%  Weight:      Height:        Intake/Output Summary (Last 24 hours) at 04/15/2022 1900 Last data filed at 04/15/2022 1830 Gross per 24 hour  Intake 4699.15 ml  Output 3900 ml  Net 799.15 ml    Filed Weights   04/13/22 1014  Weight: 67.1 kg    Examination:  General exam: NAD Respiratory system: Lungs clear to auscultation bilaterally.  No wheezes, no crackles, no rhonchi.  Fair air movement.  Speaking in full sentences.   Cardiovascular system: Regular rate rhythm no murmurs rubs or gallops.  No JVD.  No lower extremity edema.  Gastrointestinal system: Abdomen is soft, nondistended, some diffuse tenderness to palpation.  Mid abdominal dressing noted with venting gastrostomy tube.  No rebound.  No guarding.   Central nervous system: Alert and oriented. No focal neurological deficits. Extremities: Symmetric 5 x 5 power. Skin: No rashes, lesions or ulcers Psychiatry: Judgement and insight appear normal. Mood & affect appropriate.     Data Reviewed: I have personally reviewed following labs and imaging studies  CBC: Recent Labs  Lab 04/13/22 1016 04/14/22 0436 04/15/22 0412  WBC 6.0 6.4 5.6  NEUTROABS 4.2  --   --   HGB 13.9 12.2 11.4*  HCT 42.3 37.4 35.1*  MCV 97.7 98.2 98.3  PLT 258 249 212     Basic Metabolic Panel: Recent Labs  Lab 04/13/22 1016 04/14/22 0436 04/15/22 0412  NA 138 138 140  K 3.9 3.6 3.8  CL 104 104 105  CO2 27 23 25  $ GLUCOSE 107* 122* 112*  BUN 9 8 5*  CREATININE 0.61 0.75 0.63  CALCIUM 9.0 8.7* 8.6*  MG  --   --  1.8  PHOS  --   --  3.7     GFR: Estimated Creatinine Clearance: 67.3 mL/min (by C-G formula based on SCr of 0.63 mg/dL).  Liver Function Tests: Recent Labs  Lab 04/13/22 1016  AST 25  ALT 19  ALKPHOS 62  BILITOT 1.1  PROT 6.9  ALBUMIN 4.0     CBG: No results for input(s): "GLUCAP" in the last  168 hours.   No results found for this or any previous visit (from the past 240 hour(s)).       Radiology Studies: No results found.      Scheduled Meds:  cycloSPORINE  1 drop Both Eyes BID   Ensure Max Protein  11 oz Oral TID   Continuous Infusions:  lactated ringers 75 mL/hr at 04/15/22 1233     LOS: 2 days    Time spent: 35 minutes    Irine Seal, MD [facility]   To contact the attending provider between 7A-7P or the covering provider during after hours 7P-7A, please log into the web site [email or web address] and access using universal [place] password for that web site. If you do not have the password, please call the hospital operator.  04/15/2022, 7:00 PM

## 2022-04-15 NOTE — Progress Notes (Signed)
Mobility Specialist - Progress Note   04/15/22 1302  Mobility  Activity Ambulated independently in hallway  Level of Assistance Independent  Assistive Device None  Distance Ambulated (ft) 600 ft  Activity Response Tolerated well  Mobility Referral Yes  $Mobility charge 1 Mobility   Pt received in bed and agreeable to mobility. No complaints during session. Pt to bed after session with all needs met.   [facility]

## 2022-04-15 NOTE — Progress Notes (Signed)
1 Day Post-Op  Subjective: CC: Doing well. Soreness around g-tube and incisions that is well controlled with medications. Feels a little bloated/distended but tolerating cld without n/v with G-tube clamped. G-tube has not had to be unclamped since surgery. Passing flatus. Had some liquid bm's before surgery yesterday but has not had any since surgery. Voiding. Husband is at bedside.   Objective: Vital signs in last 24 hours: Temp:  [97.5 F (36.4 C)-99.1 F (37.3 C)] 98.9 F (37.2 C) (02/15 0436) Pulse Rate:  [51-94] 61 (02/15 0436) Resp:  [13-20] 17 (02/15 0436) BP: (102-143)/(70-97) 107/76 (02/15 0436) SpO2:  [92 %-100 %] 96 % (02/15 0436) Last BM Date : 04/14/22  Intake/Output from previous day: 02/14 0701 - 02/15 0700 In: 4401.1 [P.O.:1080; I.V.:3121.1; IV Piggyback:200] Out: 2920 [Urine:2900; Blood:20] Intake/Output this shift: Total I/O In: 120 [P.O.:120] Out: 400 [Urine:400]  PE:  Gen:  Alert, NAD, pleasant Abd: Soft, mild distension, appropriately tender around laparoscopic incisions and G-tube, no rigidity or guarding and otherwise NT, +BS. Incisions with glue intact appears well and are without drainage, bleeding, or signs of infection. G-tube clamped. G-tube site with dressing in place over, cdi Psych: A&Ox3   Lab Results:  Recent Labs    04/14/22 0436 04/15/22 0412  WBC 6.4 5.6  HGB 12.2 11.4*  HCT 37.4 35.1*  PLT 249 212   BMET Recent Labs    04/14/22 0436 04/15/22 0412  NA 138 140  K 3.6 3.8  CL 104 105  CO2 23 25  GLUCOSE 122* 112*  BUN 8 5*  CREATININE 0.75 0.63  CALCIUM 8.7* 8.6*   PT/INR No results for input(s): "LABPROT", "INR" in the last 72 hours. CMP     Component Value Date/Time   NA 140 04/15/2022 0412   K 3.8 04/15/2022 0412   CL 105 04/15/2022 0412   CO2 25 04/15/2022 0412   GLUCOSE 112 (H) 04/15/2022 0412   BUN 5 (L) 04/15/2022 0412   CREATININE 0.63 04/15/2022 0412   CREATININE 0.91 01/17/2019 1301   CREATININE  0.66 08/28/2012 1400   CALCIUM 8.6 (L) 04/15/2022 0412   PROT 6.9 04/13/2022 1016   ALBUMIN 4.0 04/13/2022 1016   AST 25 04/13/2022 1016   AST 22 01/17/2019 1301   ALT 19 04/13/2022 1016   ALT 17 01/17/2019 1301   ALKPHOS 62 04/13/2022 1016   BILITOT 1.1 04/13/2022 1016   BILITOT 0.9 01/17/2019 1301   GFRNONAA >60 04/15/2022 0412   GFRNONAA >60 01/17/2019 1301   GFRNONAA >89 08/28/2012 1400   GFRAA >60 01/17/2019 1301   GFRAA >89 08/28/2012 1400   Lipase     Component Value Date/Time   LIPASE 31 04/13/2022 1016    Studies/Results: CT Abdomen Pelvis W Contrast  Result Date: 04/13/2022 CLINICAL DATA:  Abdominal pain, concern for bowel obstruction. Breast cancer, metastatic disease evaluation. * Tracking Code: BO * EXAM: CT CHEST, ABDOMEN, AND PELVIS WITH CONTRAST TECHNIQUE: Multidetector CT imaging of the chest, abdomen and pelvis was performed following the standard protocol during bolus administration of intravenous contrast. RADIATION DOSE REDUCTION: This exam was performed according to the departmental dose-optimization program which includes automated exposure control, adjustment of the mA and/or kV according to patient size and/or use of iterative reconstruction technique. CONTRAST:  136m OMNIPAQUE IOHEXOL 300 MG/ML  SOLN COMPARISON:  CT abdomen pelvis March 31, 2022. FINDINGS: CT CHEST FINDINGS Cardiovascular: Normal caliber thoracic aorta. Aortic atherosclerosis. Normal caliber central pulmonary arteries. Normal size heart. No significant pericardial effusion/thickening.  Mediastinum/Nodes: No suspicious thyroid nodule. No pathologically enlarged mediastinal, hilar or axillary lymph nodes. Small amount of crescentic feathery soft tissue stranding in the anterior mediastinum for instance on image 25/2. Prior right axillary lymph node dissection. Lungs/Pleura: No aggressive lytic or blastic lesion of bone. Biapical pleuroparenchymal scarring. Left lower lobe pneumatocele. No pleural  effusion. No pneumothorax. Musculoskeletal: Prior bilateral mastectomies with breast reconstruction and right axillary lymph node dissection. Sclerotic foci in the left humeral head on image 6/2 as well as on image 9/2. Sclerotic focus in the inferior endplate of T6, with associated endplate degenerative change. CT ABDOMEN PELVIS FINDINGS Hepatobiliary: Hypodense hepatic lesions measure fluid density compatible with cysts. Additional hypodense hepatic lesions are technically too small to accurately characterize but stable from prior examination and also favored to reflect cysts. No solid enhancing hepatic lesion identified. Gallbladder is unremarkable. No biliary ductal dilation. Pancreas: No pancreatic ductal dilation or evidence of acute inflammation. Spleen: No splenomegaly. Adrenals/Urinary Tract: Bilateral adrenal glands appear normal. No hydronephrosis. Kidneys demonstrate symmetric enhancement. Urinary bladder is unremarkable for degree of distension Stomach/Bowel: Worsening dilation of small bowel loops measuring up to 4.3 cm in diameter on image 81/2 with abrupt transition to decompressed bowel on image 87/2 near the site of the mucosal hyperenhancement seen on CT enterography December 09, 2021. Distal to this there is another prominent fluid-filled loop of bowel in the pelvis measuring 2.2 cm also with abrupt transition to decompressed bowel in area of bowel wall thickening with adjacent pelvic free fluid. Additionally there are multifocal short and moderate length segments of small bowel wall thickening with adjacent mesenteric edema/stranding for instance in the left hemiabdomen on image 75/2. Normal appendix. Colon is predominately decompressed limiting evaluation. Vascular/Lymphatic: Aortic atherosclerosis. Normal caliber abdominal aorta. Smooth IVC contours. No pathologically enlarged abdominal or pelvic lymph nodes. Prominent ileocolic lymph nodes measure up to 6 mm in short axis on image 82/2  Reproductive: Uterus and bilateral adnexa are unremarkable. Other: Small volume mesenteric and pelvic free fluid. Musculoskeletal: Sclerotic focus in the right femoral head on image 67/5. Multilevel Schmorl's node formation with associated degenerative endplate changes. Lumbar spondylosis. Grade 1 L4 on L5 degenerative anterolisthesis. IMPRESSION: 1. Worsening long segment of dilated fluid-filled small bowel with transition point in the right hemiabdomen similar in location to the area of mucosal hyperenhancement seen on prior imaging, compatible with worsening small bowel obstruction possibly related to an inflammatory or malignant stricture or adhesion. 2. Additionally distal to this there is a prominent fluid-filled loop of small bowel with abrupt transition to decompressed bowel with apparent wall thickening of this bowel similar to the more proximal section. Also there are multifocal short and moderate length segments of small bowel wall thickening with adjacent mesenteric edema/stranding, complex findings which may reflect multifocal disease from the above process, NSAID enteritis or superimposed infectious/inflammatory enteritis. 3. Prominent ileocolic lymph nodes measuring up to 6 mm in short axis, nonspecific but favored reactive. 4. Sclerotic foci in the left humeral head, T6 vertebral body and right femoral head are nonspecific but favored to reflect bone islands. Consider attention on nuclear medicine bone scan. 5. Small amount of crescentic feathery soft tissue stranding in the anterior mediastinum, nonspecific but favored to reflect thymic hyperplasia. 6. Prior bilateral mastectomies with breast reconstruction and right axillary lymph node dissection. 7. Hypodense hepatic lesions measure fluid density compatible with cysts. Additional hypodense hepatic lesions are technically too small to accurately characterize but also favored to reflect cysts. Attention on follow-up imaging suggested. 8.  Aortic  Atherosclerosis (ICD10-I70.0). Electronically Signed   By: Dahlia Bailiff M.D.   On: 04/13/2022 16:08   CT Chest W Contrast  Result Date: 04/13/2022 CLINICAL DATA:  Abdominal pain, concern for bowel obstruction. Breast cancer, metastatic disease evaluation. * Tracking Code: BO * EXAM: CT CHEST, ABDOMEN, AND PELVIS WITH CONTRAST TECHNIQUE: Multidetector CT imaging of the chest, abdomen and pelvis was performed following the standard protocol during bolus administration of intravenous contrast. RADIATION DOSE REDUCTION: This exam was performed according to the departmental dose-optimization program which includes automated exposure control, adjustment of the mA and/or kV according to patient size and/or use of iterative reconstruction technique. CONTRAST:  190m OMNIPAQUE IOHEXOL 300 MG/ML  SOLN COMPARISON:  CT abdomen pelvis March 31, 2022. FINDINGS: CT CHEST FINDINGS Cardiovascular: Normal caliber thoracic aorta. Aortic atherosclerosis. Normal caliber central pulmonary arteries. Normal size heart. No significant pericardial effusion/thickening. Mediastinum/Nodes: No suspicious thyroid nodule. No pathologically enlarged mediastinal, hilar or axillary lymph nodes. Small amount of crescentic feathery soft tissue stranding in the anterior mediastinum for instance on image 25/2. Prior right axillary lymph node dissection. Lungs/Pleura: No aggressive lytic or blastic lesion of bone. Biapical pleuroparenchymal scarring. Left lower lobe pneumatocele. No pleural effusion. No pneumothorax. Musculoskeletal: Prior bilateral mastectomies with breast reconstruction and right axillary lymph node dissection. Sclerotic foci in the left humeral head on image 6/2 as well as on image 9/2. Sclerotic focus in the inferior endplate of T6, with associated endplate degenerative change. CT ABDOMEN PELVIS FINDINGS Hepatobiliary: Hypodense hepatic lesions measure fluid density compatible with cysts. Additional hypodense hepatic lesions  are technically too small to accurately characterize but stable from prior examination and also favored to reflect cysts. No solid enhancing hepatic lesion identified. Gallbladder is unremarkable. No biliary ductal dilation. Pancreas: No pancreatic ductal dilation or evidence of acute inflammation. Spleen: No splenomegaly. Adrenals/Urinary Tract: Bilateral adrenal glands appear normal. No hydronephrosis. Kidneys demonstrate symmetric enhancement. Urinary bladder is unremarkable for degree of distension Stomach/Bowel: Worsening dilation of small bowel loops measuring up to 4.3 cm in diameter on image 81/2 with abrupt transition to decompressed bowel on image 87/2 near the site of the mucosal hyperenhancement seen on CT enterography December 09, 2021. Distal to this there is another prominent fluid-filled loop of bowel in the pelvis measuring 2.2 cm also with abrupt transition to decompressed bowel in area of bowel wall thickening with adjacent pelvic free fluid. Additionally there are multifocal short and moderate length segments of small bowel wall thickening with adjacent mesenteric edema/stranding for instance in the left hemiabdomen on image 75/2. Normal appendix. Colon is predominately decompressed limiting evaluation. Vascular/Lymphatic: Aortic atherosclerosis. Normal caliber abdominal aorta. Smooth IVC contours. No pathologically enlarged abdominal or pelvic lymph nodes. Prominent ileocolic lymph nodes measure up to 6 mm in short axis on image 82/2 Reproductive: Uterus and bilateral adnexa are unremarkable. Other: Small volume mesenteric and pelvic free fluid. Musculoskeletal: Sclerotic focus in the right femoral head on image 67/5. Multilevel Schmorl's node formation with associated degenerative endplate changes. Lumbar spondylosis. Grade 1 L4 on L5 degenerative anterolisthesis. IMPRESSION: 1. Worsening long segment of dilated fluid-filled small bowel with transition point in the right hemiabdomen similar in  location to the area of mucosal hyperenhancement seen on prior imaging, compatible with worsening small bowel obstruction possibly related to an inflammatory or malignant stricture or adhesion. 2. Additionally distal to this there is a prominent fluid-filled loop of small bowel with abrupt transition to decompressed bowel with apparent wall thickening of this bowel similar to the more  proximal section. Also there are multifocal short and moderate length segments of small bowel wall thickening with adjacent mesenteric edema/stranding, complex findings which may reflect multifocal disease from the above process, NSAID enteritis or superimposed infectious/inflammatory enteritis. 3. Prominent ileocolic lymph nodes measuring up to 6 mm in short axis, nonspecific but favored reactive. 4. Sclerotic foci in the left humeral head, T6 vertebral body and right femoral head are nonspecific but favored to reflect bone islands. Consider attention on nuclear medicine bone scan. 5. Small amount of crescentic feathery soft tissue stranding in the anterior mediastinum, nonspecific but favored to reflect thymic hyperplasia. 6. Prior bilateral mastectomies with breast reconstruction and right axillary lymph node dissection. 7. Hypodense hepatic lesions measure fluid density compatible with cysts. Additional hypodense hepatic lesions are technically too small to accurately characterize but also favored to reflect cysts. Attention on follow-up imaging suggested. 8.  Aortic Atherosclerosis (ICD10-I70.0). Electronically Signed   By: Dahlia Bailiff M.D.   On: 04/13/2022 16:08   CT Head Wo Contrast  Result Date: 04/13/2022 CLINICAL DATA:  Metastatic disease evaluation. EXAM: CT HEAD WITHOUT CONTRAST TECHNIQUE: Contiguous axial images were obtained from the base of the skull through the vertex without intravenous contrast. RADIATION DOSE REDUCTION: This exam was performed according to the departmental dose-optimization program which  includes automated exposure control, adjustment of the mA and/or kV according to patient size and/or use of iterative reconstruction technique. COMPARISON:  None Available. FINDINGS: Brain: No acute hemorrhage, mass effect or midline shift. Gray-white differentiation is preserved. No hydrocephalus. No extra-axial collection. Basilar cisterns are patent. Vascular: No hyperdense vessel or unexpected calcification. Skull: No calvarial fracture or suspicious bone lesion. Skull base is unremarkable. Sinuses/Orbits: Unremarkable. Other: None. IMPRESSION: No acute intracranial abnormality. Electronically Signed   By: Emmit Alexanders M.D.   On: 04/13/2022 15:25    Anti-infectives: Anti-infectives (From admission, onward)    Start     Dose/Rate Route Frequency Ordered Stop   04/14/22 1100  cefOXitin (MEFOXIN) 2 g in sodium chloride 0.9 % 100 mL IVPB        2 g 200 mL/hr over 30 Minutes Intravenous On call to O.R. 04/14/22 1004 04/14/22 1253        Assessment/Plan POD 1 s/p diagnostic laparoscopic, LOA, omental bx, laparoscopic G-tube placement by Dr. 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  - Cytology pending. Omental bx negative for malignancy  - Adv to FLD + Protein shakes with G-tube clamped. Can vent as needed. Would leave on FLD + shakes at d/c. Dietician consult.  - Await oncology recs. Our team has discussed with their team this am.  - Updated family at bedside - We will follow with you.   FEN - FLD + Shakes with G-Tube clamped. IVF per [facility] VTE - SCDs, okay for chemical ppx from a general surgery standpoint  ID - Cefoxitin peri-op Foley - None, voiding.     LOS: 2 days    Jillyn Ledger , [facility] 04/15/2022, 8:57 AM Please see Amion for pager number during day hours 7:00am-4:30pm

## 2022-04-15 NOTE — Telephone Encounter (Signed)
Received notification from Dr. Irene Limbo on pt admitted with sbo, possible met breast. Called [facility] to inquire on prognostic panel results from bx on 2/8 of small bowel. Prognostics were not ordered. Requested to be ran on bx per Dr. Chryl Heck order.

## 2022-04-15 NOTE — TOC CM/SW Note (Signed)
  Transition of Care [facility]) Screening Note   Patient Details  Name: [name] Date of Birth: November 23, 1955   Transition of [facility]) CM/SW Contact:    Lennart Pall, LCSW Phone Number: 04/15/2022, 11:01 AM    Transition of Care Department [facility]) has reviewed patient and no TOC needs have been identified at this time. We will continue to monitor patient advancement through interdisciplinary progression rounds. If new patient transition needs arise, please place a TOC consult.

## 2022-04-16 ENCOUNTER — Other Ambulatory Visit: Payer: Self-pay | Admitting: *Deleted

## 2022-04-16 ENCOUNTER — Other Ambulatory Visit: Payer: Self-pay | Admitting: Hematology and Oncology

## 2022-04-16 ENCOUNTER — Telehealth: Payer: Self-pay | Admitting: Hematology and Oncology

## 2022-04-16 DIAGNOSIS — D0511 Intraductal carcinoma in situ of right breast: Secondary | ICD-10-CM | POA: Diagnosis not present

## 2022-04-16 DIAGNOSIS — E785 Hyperlipidemia, unspecified: Secondary | ICD-10-CM | POA: Diagnosis not present

## 2022-04-16 DIAGNOSIS — C799 Secondary malignant neoplasm of unspecified site: Secondary | ICD-10-CM

## 2022-04-16 DIAGNOSIS — K56609 Unspecified intestinal obstruction, unspecified as to partial versus complete obstruction: Secondary | ICD-10-CM | POA: Diagnosis not present

## 2022-04-16 LAB — CYTOLOGY - NON PAP

## 2022-04-16 MED ORDER — ONDANSETRON 4 MG PO TBDP: 4.0000 mg | ORAL_TABLET | Freq: Four times a day (QID) | ORAL | 0 refills | Status: DC | PRN

## 2022-04-16 MED ORDER — ENSURE MAX PROTEIN PO LIQD: 11.0000 [oz_av] | Freq: Three times a day (TID) | ORAL | Status: AC

## 2022-04-16 MED ORDER — ACETAMINOPHEN 500 MG PO TABS: 1000.0000 mg | ORAL_TABLET | Freq: Four times a day (QID) | ORAL | 0 refills | Status: AC | PRN

## 2022-04-16 NOTE — Progress Notes (Signed)
PET Ct ordered. Message sent to NN and scheduling team.  [name]

## 2022-04-16 NOTE — Progress Notes (Signed)
Patient refused morning labs. Per patient, "they have to stick me several times before they get it, and plus I am going home today."

## 2022-04-16 NOTE — Discharge Summary (Signed)
Physician Discharge Summary  [name] [id number] DOB: [birth year] DOA: 04/13/2022  PCP: Chesley Noon, MD  Admit date: 04/13/2022 Discharge date: 04/16/2022  Time spent: 55 minutes  Recommendations for Outpatient Follow-up:  Follow-up with general surgery, Dr. Thermon Leyland in 4 weeks. Follow-up with Dr. Chryl Heck, hematology/oncology in 7 to 10 days.   Discharge Diagnoses:  Principal Problem:   SBO (small bowel obstruction) (HCC) Active Problems:   Hyperlipemia   Ductal carcinoma in situ (DCIS) of right breast   Discharge Condition: Stable and improved.  Diet recommendation: Full liquid diet plus protein shakes.  Filed Weights   04/13/22 1014  Weight: 67.1 kg    History of present illness:  HPI per Dr. Waynard Reeds [name] is a 67 y.o. female with medical history significant of breast cancer and HLD presenting with abdominal pain.  She was last admitted from 1/31-2/2 for high-grade SBO that was treated conservatively.   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  She felt fine for a couple of days and the pain started back after the procedure, maybe Saturday (2/10).  She was up all night Sunday night and couldn't work yesterday.  She felt worse and came back today and then got the news from [facility]. [facility] Colon while in the ER.  They are hoping to be admitted for surgery, oncology here.       ER Course:  Recently admitted with SBO.  Back with abdominal pain, 2 small areas of focal tightening.  Biopsies at [facility] from enteroscopy returned with cancer, likely breast.  Would like to see oncology here at [facility].   Hospital Course:  #1 small bowel obstruction -Patient presented with a small bowel obstruction noted on CT abdomen and pelvis. -Concern for malignant small bowel obstruction as patient with prior history of remote breast cancer, seen at [facility] following recent  admission hide EGD with enteroscopy with biopsies concerning for poorly differentiated carcinoma possibly from breast cancer. -Patient seen by general surgery, initially placed on clear liquids, subsequently made n.p.o. and underwent laparoscopic G-tube placement, omental biopsy, lysis of adhesions, diagnostic laparoscopy on 04/14/2022 per general surgery. -Omental biopsy noted to be negative for malignancy. -Patient placed on clear liquid diet which she tolerated and diet advanced to a full liquid diet plus protein shakes.  G-tube clamped.  Per general surgery G-tube for venting as needed.   -Patient hydrated with IV fluids, pain was managed, patient tolerated full liquids and cleared by general surgery for discharge.   -Patient seen by oncology during the hospitalization. -Patient will be discharged home in stable and improved condition to follow-up with general surgery and oncology in the outpatient setting.     2.  Metastatic breast cancer -Due to recent EGD and enteroscopy findings concern for malignant breast cancer and may need chemotherapy postop. -Patient noted to have been followed by Dr. Jana Hakim in the past and had requested admitting physician to be seen by oncology during this hospitalization. -Oncology was consulted and patient seen in consultation by Dr Chryl Heck, whom assessed patient, recommended outpatient PET scan with outpatient follow-up in the clinic 7 to 10 days postdischarge.   -Patient will be discharged in stable and improved condition.    3.  Hyperlipidemia -Patient's regimen was held during the hospitalization and will be resumed on discharge.  Procedures: CT head 04/13/2022 CT chest 04/13/2022 CT abdomen and pelvis 04/13/2022 Laparoscopic G-tube placement, omental biopsy, lysis of  adhesions, diagnostic laparoscopy per Dr. Thermon Leyland General surgery 04/14/2022  Consultations: General surgery: Dr. Thermon Leyland Hematology/oncology Dr. Chryl Heck   Discharge Exam: Vitals:    04/15/22 2015 04/16/22 0457  BP: 127/86 106/71  Pulse: 77 (!) 59  Resp: 18 16  Temp: 98.2 F (36.8 C) 98 F (36.7 C)  SpO2: 100% 94%    General: NAD Cardiovascular: RRR no murmurs rubs or gallops.  No JVD.  No lower extremity edema. Respiratory: Clear to auscultation bilaterally.  No wheezes, no crackles, no rhonchi.  Fair air movement.  Speaking in full sentences. Gastrointestinal system: Abdomen is soft, nondistended, some diffuse tenderness to palpation.  Mid abdominal dressing noted with venting gastrostomy tube.  No rebound.  No guarding.     Discharge Instructions   Discharge Instructions     Diet full liquid   Complete by: As directed    Increase activity slowly   Complete by: As directed       Allergies as of 04/16/2022   Not on File      Medication List     TAKE these medications    acetaminophen 500 MG tablet Commonly known as: TYLENOL Take 2 tablets (1,000 mg total) by mouth every 6 (six) hours as needed for moderate pain. What changed:  how much to take when to take this   calcium carbonate 1250 (500 Ca) MG tablet Commonly known as: OS-CAL - dosed in mg of elemental calcium Take 1 tablet by mouth 2 (two) times daily with a meal.   cycloSPORINE 0.05 % ophthalmic emulsion Commonly known as: RESTASIS Place 1 drop into both eyes 2 (two) times daily.   Ensure Max Protein Liqd Take 330 mLs (11 oz total) by mouth 3 (three) times daily.   famotidine 40 MG tablet Commonly known as: PEPCID Take 40 mg by mouth daily as needed for heartburn or indigestion.   Melatonin 10 MG Tabs Take 10 mg by mouth at bedtime.   MULTI-VITAMIN DAILY PO Take 1 tablet by mouth daily.   omega-3 acid ethyl esters 1 g capsule Commonly known as: LOVAZA Take 1 g by mouth 2 (two) times daily.   ondansetron 4 MG disintegrating tablet Commonly known as: ZOFRAN-ODT Take 1 tablet (4 mg total) by mouth every 6 (six) hours as needed for nausea.   rosuvastatin 10 MG  tablet Commonly known as: CRESTOR Take 10 mg by mouth at bedtime.   TUMS PO Take 3-4 tablets by mouth as needed (heartburn).   Vitamin D3 125 MCG (5000 UT) capsule Take 5,000 Units by mouth every Monday.       Not on File  Follow-up Information     Stechschulte, Nickola Major, MD. Schedule an appointment as soon as possible for a visit in 4 week(s).   Specialty: Surgery Why: Our office is working on scheduling this for you, please call to confirm appointment date/time. Contact information: [address] [place] [address] [phone]         Benay Pike, MD. Schedule an appointment as soon as possible for a visit in 1 week(s).   Specialty: Hematology and Oncology Why: f/u in 7-10 days Contact information: [place] [address] [id number]                  The results of significant diagnostics from this hospitalization (including imaging, microbiology, ancillary and laboratory) are listed below for reference.    Significant Diagnostic Studies: CT Abdomen Pelvis W Contrast  Result Date: 04/13/2022  CLINICAL DATA:  Abdominal pain, concern for bowel obstruction. Breast cancer, metastatic disease evaluation. * Tracking Code: BO * EXAM: CT CHEST, ABDOMEN, AND PELVIS WITH CONTRAST TECHNIQUE: Multidetector CT imaging of the chest, abdomen and pelvis was performed following the standard protocol during bolus administration of intravenous contrast. RADIATION DOSE REDUCTION: This exam was performed according to the departmental dose-optimization program which includes automated exposure control, adjustment of the mA and/or kV according to patient size and/or use of iterative reconstruction technique. CONTRAST:  18m OMNIPAQUE IOHEXOL 300 MG/ML  SOLN COMPARISON:  CT abdomen pelvis March 31, 2022. FINDINGS: CT CHEST FINDINGS Cardiovascular: Normal caliber thoracic aorta. Aortic atherosclerosis. Normal caliber central pulmonary arteries. Normal  size heart. No significant pericardial effusion/thickening. Mediastinum/Nodes: No suspicious thyroid nodule. No pathologically enlarged mediastinal, hilar or axillary lymph nodes. Small amount of crescentic feathery soft tissue stranding in the anterior mediastinum for instance on image 25/2. Prior right axillary lymph node dissection. Lungs/Pleura: No aggressive lytic or blastic lesion of bone. Biapical pleuroparenchymal scarring. Left lower lobe pneumatocele. No pleural effusion. No pneumothorax. Musculoskeletal: Prior bilateral mastectomies with breast reconstruction and right axillary lymph node dissection. Sclerotic foci in the left humeral head on image 6/2 as well as on image 9/2. Sclerotic focus in the inferior endplate of T6, with associated endplate degenerative change. CT ABDOMEN PELVIS FINDINGS Hepatobiliary: Hypodense hepatic lesions measure fluid density compatible with cysts. Additional hypodense hepatic lesions are technically too small to accurately characterize but stable from prior examination and also favored to reflect cysts. No solid enhancing hepatic lesion identified. Gallbladder is unremarkable. No biliary ductal dilation. Pancreas: No pancreatic ductal dilation or evidence of acute inflammation. Spleen: No splenomegaly. Adrenals/Urinary Tract: Bilateral adrenal glands appear normal. No hydronephrosis. Kidneys demonstrate symmetric enhancement. Urinary bladder is unremarkable for degree of distension Stomach/Bowel: Worsening dilation of small bowel loops measuring up to 4.3 cm in diameter on image 81/2 with abrupt transition to decompressed bowel on image 87/2 near the site of the mucosal hyperenhancement seen on CT enterography December 09, 2021. Distal to this there is another prominent fluid-filled loop of bowel in the pelvis measuring 2.2 cm also with abrupt transition to decompressed bowel in area of bowel wall thickening with adjacent pelvic free fluid. Additionally there are multifocal  short and moderate length segments of small bowel wall thickening with adjacent mesenteric edema/stranding for instance in the left hemiabdomen on image 75/2. Normal appendix. Colon is predominately decompressed limiting evaluation. Vascular/Lymphatic: Aortic atherosclerosis. Normal caliber abdominal aorta. Smooth IVC contours. No pathologically enlarged abdominal or pelvic lymph nodes. Prominent ileocolic lymph nodes measure up to 6 mm in short axis on image 82/2 Reproductive: Uterus and bilateral adnexa are unremarkable. Other: Small volume mesenteric and pelvic free fluid. Musculoskeletal: Sclerotic focus in the right femoral head on image 67/5. Multilevel Schmorl's node formation with associated degenerative endplate changes. Lumbar spondylosis. Grade 1 L4 on L5 degenerative anterolisthesis. IMPRESSION: 1. Worsening long segment of dilated fluid-filled small bowel with transition point in the right hemiabdomen similar in location to the area of mucosal hyperenhancement seen on prior imaging, compatible with worsening small bowel obstruction possibly related to an inflammatory or malignant stricture or adhesion. 2. Additionally distal to this there is a prominent fluid-filled loop of small bowel with abrupt transition to decompressed bowel with apparent wall thickening of this bowel similar to the more proximal section. Also there are multifocal short and moderate length segments of small bowel wall thickening with adjacent mesenteric edema/stranding, complex findings which may reflect multifocal disease from  the above process, NSAID enteritis or superimposed infectious/inflammatory enteritis. 3. Prominent ileocolic lymph nodes measuring up to 6 mm in short axis, nonspecific but favored reactive. 4. Sclerotic foci in the left humeral head, T6 vertebral body and right femoral head are nonspecific but favored to reflect bone islands. Consider attention on nuclear medicine bone scan. 5. Small amount of crescentic  feathery soft tissue stranding in the anterior mediastinum, nonspecific but favored to reflect thymic hyperplasia. 6. Prior bilateral mastectomies with breast reconstruction and right axillary lymph node dissection. 7. Hypodense hepatic lesions measure fluid density compatible with cysts. Additional hypodense hepatic lesions are technically too small to accurately characterize but also favored to reflect cysts. Attention on follow-up imaging suggested. 8.  Aortic Atherosclerosis (ICD10-I70.0). Electronically Signed   By: Dahlia Bailiff M.D.   On: 04/13/2022 16:08   CT Chest W Contrast  Result Date: 04/13/2022 CLINICAL DATA:  Abdominal pain, concern for bowel obstruction. Breast cancer, metastatic disease evaluation. * Tracking Code: BO * EXAM: CT CHEST, ABDOMEN, AND PELVIS WITH CONTRAST TECHNIQUE: Multidetector CT imaging of the chest, abdomen and pelvis was performed following the standard protocol during bolus administration of intravenous contrast. RADIATION DOSE REDUCTION: This exam was performed according to the departmental dose-optimization program which includes automated exposure control, adjustment of the mA and/or kV according to patient size and/or use of iterative reconstruction technique. CONTRAST:  123m OMNIPAQUE IOHEXOL 300 MG/ML  SOLN COMPARISON:  CT abdomen pelvis March 31, 2022. FINDINGS: CT CHEST FINDINGS Cardiovascular: Normal caliber thoracic aorta. Aortic atherosclerosis. Normal caliber central pulmonary arteries. Normal size heart. No significant pericardial effusion/thickening. Mediastinum/Nodes: No suspicious thyroid nodule. No pathologically enlarged mediastinal, hilar or axillary lymph nodes. Small amount of crescentic feathery soft tissue stranding in the anterior mediastinum for instance on image 25/2. Prior right axillary lymph node dissection. Lungs/Pleura: No aggressive lytic or blastic lesion of bone. Biapical pleuroparenchymal scarring. Left lower lobe pneumatocele. No  pleural effusion. No pneumothorax. Musculoskeletal: Prior bilateral mastectomies with breast reconstruction and right axillary lymph node dissection. Sclerotic foci in the left humeral head on image 6/2 as well as on image 9/2. Sclerotic focus in the inferior endplate of T6, with associated endplate degenerative change. CT ABDOMEN PELVIS FINDINGS Hepatobiliary: Hypodense hepatic lesions measure fluid density compatible with cysts. Additional hypodense hepatic lesions are technically too small to accurately characterize but stable from prior examination and also favored to reflect cysts. No solid enhancing hepatic lesion identified. Gallbladder is unremarkable. No biliary ductal dilation. Pancreas: No pancreatic ductal dilation or evidence of acute inflammation. Spleen: No splenomegaly. Adrenals/Urinary Tract: Bilateral adrenal glands appear normal. No hydronephrosis. Kidneys demonstrate symmetric enhancement. Urinary bladder is unremarkable for degree of distension Stomach/Bowel: Worsening dilation of small bowel loops measuring up to 4.3 cm in diameter on image 81/2 with abrupt transition to decompressed bowel on image 87/2 near the site of the mucosal hyperenhancement seen on CT enterography December 09, 2021. Distal to this there is another prominent fluid-filled loop of bowel in the pelvis measuring 2.2 cm also with abrupt transition to decompressed bowel in area of bowel wall thickening with adjacent pelvic free fluid. Additionally there are multifocal short and moderate length segments of small bowel wall thickening with adjacent mesenteric edema/stranding for instance in the left hemiabdomen on image 75/2. Normal appendix. Colon is predominately decompressed limiting evaluation. Vascular/Lymphatic: Aortic atherosclerosis. Normal caliber abdominal aorta. Smooth IVC contours. No pathologically enlarged abdominal or pelvic lymph nodes. Prominent ileocolic lymph nodes measure up to 6 mm in short axis on image  82/2  Reproductive: Uterus and bilateral adnexa are unremarkable. Other: Small volume mesenteric and pelvic free fluid. Musculoskeletal: Sclerotic focus in the right femoral head on image 67/5. Multilevel Schmorl's node formation with associated degenerative endplate changes. Lumbar spondylosis. Grade 1 L4 on L5 degenerative anterolisthesis. IMPRESSION: 1. Worsening long segment of dilated fluid-filled small bowel with transition point in the right hemiabdomen similar in location to the area of mucosal hyperenhancement seen on prior imaging, compatible with worsening small bowel obstruction possibly related to an inflammatory or malignant stricture or adhesion. 2. Additionally distal to this there is a prominent fluid-filled loop of small bowel with abrupt transition to decompressed bowel with apparent wall thickening of this bowel similar to the more proximal section. Also there are multifocal short and moderate length segments of small bowel wall thickening with adjacent mesenteric edema/stranding, complex findings which may reflect multifocal disease from the above process, NSAID enteritis or superimposed infectious/inflammatory enteritis. 3. Prominent ileocolic lymph nodes measuring up to 6 mm in short axis, nonspecific but favored reactive. 4. Sclerotic foci in the left humeral head, T6 vertebral body and right femoral head are nonspecific but favored to reflect bone islands. Consider attention on nuclear medicine bone scan. 5. Small amount of crescentic feathery soft tissue stranding in the anterior mediastinum, nonspecific but favored to reflect thymic hyperplasia. 6. Prior bilateral mastectomies with breast reconstruction and right axillary lymph node dissection. 7. Hypodense hepatic lesions measure fluid density compatible with cysts. Additional hypodense hepatic lesions are technically too small to accurately characterize but also favored to reflect cysts. Attention on follow-up imaging suggested. 8.  Aortic  Atherosclerosis (ICD10-I70.0). Electronically Signed   By: Dahlia Bailiff M.D.   On: 04/13/2022 16:08   CT Head Wo Contrast  Result Date: 04/13/2022 CLINICAL DATA:  Metastatic disease evaluation. EXAM: CT HEAD WITHOUT CONTRAST TECHNIQUE: Contiguous axial images were obtained from the base of the skull through the vertex without intravenous contrast. RADIATION DOSE REDUCTION: This exam was performed according to the departmental dose-optimization program which includes automated exposure control, adjustment of the mA and/or kV according to patient size and/or use of iterative reconstruction technique. COMPARISON:  None Available. FINDINGS: Brain: No acute hemorrhage, mass effect or midline shift. Gray-white differentiation is preserved. No hydrocephalus. No extra-axial collection. Basilar cisterns are patent. Vascular: No hyperdense vessel or unexpected calcification. Skull: No calvarial fracture or suspicious bone lesion. Skull base is unremarkable. Sinuses/Orbits: Unremarkable. Other: None. IMPRESSION: No acute intracranial abnormality. Electronically Signed   By: Emmit Alexanders M.D.   On: 04/13/2022 15:25   DG Abdomen 1 View  Result Date: 04/01/2022 CLINICAL DATA:  NG tube placement EXAM: ABDOMEN - 1 VIEW COMPARISON:  CT abdomen and pelvis 03/31/2022 FINDINGS: Enteric tube tip and side-port in the stomach. Nonspecific bowel-gas pattern. Surgical clips right chest wall. The lungs are clear. IMPRESSION: Enteric tube tip and side port in the stomach. Electronically Signed   By: Placido Sou M.D.   On: 04/01/2022 00:09   CT ABDOMEN PELVIS W CONTRAST  Result Date: 03/31/2022 CLINICAL DATA:  Generalized abdominal pain that started on the right side but is all of the abdomen. This happened a few months ago and was relieved with vomiting. EXAM: CT ABDOMEN AND PELVIS WITH CONTRAST TECHNIQUE: Multidetector CT imaging of the abdomen and pelvis was performed using the standard protocol following bolus  administration of intravenous contrast. RADIATION DOSE REDUCTION: This exam was performed according to the departmental dose-optimization program which includes automated exposure control, adjustment of the mA and/or kV  according to patient size and/or use of iterative reconstruction technique. CONTRAST:  81m OMNIPAQUE IOHEXOL 300 MG/ML  SOLN COMPARISON:  CT enterography 12/11/2021 FINDINGS: Lower chest: No acute abnormality. Hepatobiliary: No focal liver abnormality is seen. No gallstones, gallbladder wall thickening, or biliary dilatation. Pancreas: Unremarkable. No pancreatic ductal dilatation or surrounding inflammatory changes. Spleen: Normal in size without focal abnormality. Adrenals/Urinary Tract: Adrenal glands are unremarkable. Kidneys are normal, without renal calculi, focal lesion, or hydronephrosis. Bladder is unremarkable. Stomach/Bowel: Normal caliber colon. Unremarkable stomach. Normal appendix. Dilated small bowel with abrupt transition point in the central mesentery (series 5/image 56. The small bowel downstream from the transition point is decompressed. There is fecalization of bowel contents within the dilated portion of small bowel. Question mild thickening of the small bowel wall at the site of obstruction as appreciated on sagittal images (series 6/image 68-72). Vascular/Lymphatic: No significant vascular findings are present. No enlarged abdominal or pelvic lymph nodes. Reproductive: Uterus and bilateral adnexa are unremarkable. Other: Small amount of free fluid in the pelvis. No free intraperitoneal air. Musculoskeletal: No acute osseous abnormality. IMPRESSION: High-grade small bowel obstruction with transition point in the central abdomen. The small bowel wall is thickened at the site of obstruction. This may be due to infection/inflammation however underlying neoplasm is not excluded. Electronically Signed   By: TPlacido SouM.D.   On: 03/31/2022 23:17    Microbiology: No results  found for this or any previous visit (from the past 240 hour(s)).   Labs: Basic Metabolic Panel: Recent Labs  Lab 04/13/22 1016 04/14/22 0436 04/15/22 0412  NA 138 138 140  K 3.9 3.6 3.8  CL 104 104 105  CO2 27 23 25  $ GLUCOSE 107* 122* 112*  BUN 9 8 5*  CREATININE 0.61 0.75 0.63  CALCIUM 9.0 8.7* 8.6*  MG  --   --  1.8  PHOS  --   --  3.7   Liver Function Tests: Recent Labs  Lab 04/13/22 1016  AST 25  ALT 19  ALKPHOS 62  BILITOT 1.1  PROT 6.9  ALBUMIN 4.0   Recent Labs  Lab 04/13/22 1016  LIPASE 31   No results for input(s): "AMMONIA" in the last 168 hours. CBC: Recent Labs  Lab 04/13/22 1016 04/14/22 0436 04/15/22 0412  WBC 6.0 6.4 5.6  NEUTROABS 4.2  --   --   HGB 13.9 12.2 11.4*  HCT 42.3 37.4 35.1*  MCV 97.7 98.2 98.3  PLT 258 249 212   Cardiac Enzymes: No results for input(s): "CKTOTAL", "CKMB", "CKMBINDEX", "TROPONINI" in the last 168 hours. BNP: BNP (last 3 results) No results for input(s): "BNP" in the last 8760 hours.  ProBNP (last 3 results) No results for input(s): "PROBNP" in the last 8760 hours.  CBG: No results for input(s): "GLUCAP" in the last 168 hours.     Signed:  DIrine SealMD.  [facility] 04/16/2022, 3:20 PM

## 2022-04-16 NOTE — Telephone Encounter (Signed)
Spoke with patient confirming upcoming appointment 

## 2022-04-16 NOTE — Progress Notes (Signed)
Mobility Specialist - Progress Note   04/16/22 0939  Mobility  Activity Ambulated independently in hallway  Level of Assistance Independent  Assistive Device None  Distance Ambulated (ft) 600 ft  Activity Response Tolerated well  Mobility Referral Yes  $Mobility charge 1 Mobility   Pt received in bed and agreeable to mobility. No complaints during session. Pt to bed after session with all needs met.   [facility]

## 2022-04-16 NOTE — Consult Note (Signed)
[facility]  Telephone:([phone]) [phone] Fax:([phone]) [place]    Referral MD  Reason for Referral: Concern for metastatic breast cancer.  Chief Complaint  Patient presents with   Abdominal Pain    HPI:  This is a very pleasant 67 year old postmenopausal female patient who was previously seen by Dr. 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  There was no evidence of malignancy in the left breast.  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  4 right axillary lymph nodes were removed all benign.  She then had breast reconstruction.  Since she did not need antiestrogen therapy, she was not followed in the breast oncology clinic. She first noticed some blood in her stool back in November 2022, followed up with Dr. Collene Mares from gastroenterology and had a colonoscopy which was unremarkable however Dr. Collene Mares recommended further evaluation with imaging.  She then had some CT abdomen which showed no definitive abnormality.  She also started having some abdominal pains which she describes as a spasm happens intermittently started around 2 to 3 months ago but have been more frequent in onset. She was referred to [facility] for ongoing abdominal pain.    She had enteroscopy which showed discrete focal segment of abnormal jejunum these findings are concerning for lymphoma or for other infiltrative malignancy.  She then had small bowel mass biopsy.  The enteroscopy showed normal esophagus, stomach and examined duodenum.  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  Histology showed sheets to partially nested neoplastic cells with  expression of CAM5.2 and GATA3.  Since she had a history of breast cancer it was thought that this could represent metastatic breast cancer.  She was also taken to the OR on her most recent visit given worsening abdominal pain.  According to the surgeon there was 2 small bowel masses 1 about 20 cm from the ligament of Treitz this was biopsied recently.  There is another 1 approximately 100 cm from the terminal ileum bowel turned back on itself in this area and a band of omentum stuck to the bowel which was divided.  Not sure if the band or the mass was causing obstruction but the bowel proximal to this area was dilated and the bowel distal appeared normal.  I had a conversation with the surgeon and he wondered if the band was problem causing the obstruction.  The biopsied omentum showed benign omentum, negative for malignancy.  Cytology is pending  She was referred back to medical oncology for concern of metastatic breast cancer.  When I saw her yesterday, she was comfortable, denies any issues. Besides abdominal pain, she didn't complain of anything new. She is shocked by the fact that this could be met breast cancer.  Past Medical History:  Diagnosis Date   Arthritis    Backache, unspecified    Cancer ([place])    breast s/p BL mastectomies   Colon polyps    Family history of breast cancer 10/27/2021   Family history of colon cancer 10/27/2021   Hyperlipidemia    PONV (postoperative nausea and vomiting)   :   Past Surgical History:  Procedure Laterality Date   BREAST RECONSTRUCTION WITH  PLACEMENT OF TISSUE EXPANDER AND FLEX HD (ACELLULAR HYDRATED DERMIS) Bilateral 04/05/2019   Procedure: BILATERAL BREAST RECONSTRUCTION WITH PLACEMENT OF TISSUE EXPANDER AND FLEX HD (ACELLULAR HYDRATED DERMIS);  Surgeon: Wallace Going, DO;  Location: [facility];  Service: Plastics;  Laterality: Bilateral;   ENDOMETRIAL ABLATION  1998   GIVENS CAPSULE STUDY N/A 12/04/2021   Procedure:  GIVENS CAPSULE STUDY;  Surgeon: Juanita Craver, MD;  Location: [place];  Service: Gastroenterology;  Laterality: N/A;   KNEE SURGERY Right 2007   LAPAROSCOPY N/A 04/14/2022   Procedure: LAPAROSCOPY DIAGNOSTIC, G-TUBE PLACEMENT; OMENTUM BIOPSY; LYSIS OF ADHESIONS;  Surgeon: Felicie Morn, MD;  Location: [facility];  Service: General;  Laterality: N/A;   LIPOSUCTION WITH LIPOFILLING Bilateral 01/30/2020   Procedure: LIPOSUCTION WITH LIPOFILLING;  Surgeon: Wallace Going, DO;  Location: [facility];  Service: Plastics;  Laterality: Bilateral;  90 min, please   MASTECTOMY W/ SENTINEL NODE BIOPSY Bilateral 04/05/2019   Procedure: BILATERAL MASTECTOMIES WITH RIGHT SENTINEL LYMPH NODE BIOPSY;  Surgeon: [name], MD;  Location: [facility];  Service: General;  Laterality: Bilateral;   REMOVAL OF BILATERAL TISSUE EXPANDERS WITH PLACEMENT OF BILATERAL BREAST IMPLANTS Bilateral 09/26/2019   Procedure: REMOVAL OF BILATERAL TISSUE EXPANDERS WITH PLACEMENT OF BILATERAL BREAST IMPLANTS;  Surgeon: Wallace Going, DO;  Location: [facility];  Service: Plastics;  Laterality: Bilateral;   RHINOPLASTY  1977   TONSILLECTOMY  1961  :   Current Facility-Administered Medications  Medication Dose Route Frequency Provider Last Rate Last Admin   acetaminophen (TYLENOL) tablet 1,000 mg  1,000 mg Oral Q6H PRN Jillyn Ledger, PA-C   1,000 mg at 04/15/22 2139   cycloSPORINE (RESTASIS) 0.05 % ophthalmic emulsion 1 drop  1 drop Both Eyes BID Jillyn Ledger, PA-C   1 drop at 04/15/22 2135   hydrALAZINE (APRESOLINE) injection 10 mg  10 mg Intravenous Q2H PRN Maczis, Barth Kirks, PA-C       lactated ringers infusion   Intravenous Continuous Eugenie Filler, MD 75 mL/hr at 04/15/22 1233 New Bag at 04/15/22 1233   melatonin tablet 10 mg  10 mg Oral QHS PRN Raenette Rover, NP   10 mg at 04/15/22 2139   morphine (PF) 2 MG/ML injection 2 mg  2 mg Intravenous Q3H PRN  Jillyn Ledger, PA-C       ondansetron (ZOFRAN-ODT) disintegrating tablet 4 mg  4 mg Oral Q6H PRN Jillyn Ledger, PA-C       Or   ondansetron [facility]) injection 4 mg  4 mg Intravenous Q6H PRN Jillyn Ledger, PA-C   4 mg at 04/13/22 2128   oxyCODONE (Oxy IR/ROXICODONE) immediate release tablet 5-10 mg  5-10 mg Oral Q4H PRN Jillyn Ledger, PA-C   10 mg at 04/14/22 2325   protein supplement (ENSURE MAX) liquid  11 oz Oral TID Jillyn Ledger, PA-C   11 oz at 04/15/22 2135     Not on File:   Family History  Problem Relation Age of Onset   Arthritis Mother    Colon cancer Mother 29   Melanoma Father        dx 46s; nose   Lung cancer Paternal Uncle        dx after 79; smoking hx   Breast cancer Maternal Grandmother        dx 74s   Hyperlipidemia Paternal Grandmother    Cancer Paternal Grandfather  unknown type; d. before 85   Colon cancer Other        MGM's mother  :   Social History   Socioeconomic History   Marital status: Married    Spouse name: Psychologist, prison and probation services   Number of children: 3   Years of education: 16+   Highest education level: Not on file  Occupational History    Employer: [employer]   Occupation: Medical illustrator  Tobacco Use   Smoking status: Never   Smokeless tobacco: Never  Vaping Use   Vaping Use: Never used  Substance and Sexual Activity   Alcohol use: Yes    Comment: 2-3/week   Drug use: No   Sexual activity: Yes    Partners: Male  Other Topics Concern   Not on file  Social History Narrative   Marital Status:  Married Psychologist, counselling)    Children:  G3 P3 Daughters (Lauren, Sherran Needs, [place])    Pets:  Poodle (Mollie); Cats Eduard Clos, Shirlee [place])    Living Situation: Lives with spouse.  Her daughters live in [place].    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[employer])    Education:  Facilities manager)    Tobacco Use/Exposure:  None    Alcohol Use:  Occasional   Drug Use:   None   Diet:  Regular   Exercise:  Walking   Hobbies:  Gardening, Actor              Social Determinants of Health   Financial Resource Strain: Not on file  Food Insecurity: No Food Insecurity (04/13/2022)   Hunger Vital Sign    Worried About Running Out of Food in the Last Year: Never true    [place] in the Last Year: Never true  Transportation Needs: No Transportation Needs (04/13/2022)   PRAPARE - Hydrologist (Medical): No    Lack of Transportation (Non-Medical): No  Physical Activity: Not on file  Stress: Not on file  Social Connections: Not on file  Intimate Partner Violence: Not At Risk (04/13/2022)   Humiliation, Afraid, Rape, and Kick questionnaire    Fear of Current or Ex-Partner: No    Emotionally Abused: No    Physically Abused: No    Sexually Abused: No  :  Exam: Patient Vitals for the past 24 hrs:  BP Temp Temp src Pulse Resp SpO2  04/16/22 0457 106/71 98 F (36.7 C) Oral (!) 59 16 94 %  04/15/22 2015 127/86 98.2 F (36.8 C) Oral 77 18 100 %  04/15/22 1218 121/87 (!) 97.5 F (36.4 C) Oral -- 16 92 %  04/15/22 1013 106/70 98.2 F (36.8 C) Oral 61 16 92 %   Physical Exam Constitutional:      General: She is not in acute distress.    Appearance: She is well-developed and normal weight.  Cardiovascular:     Rate and Rhythm: Normal rate and regular rhythm.  Pulmonary:     Effort: Pulmonary effort is normal.     Breath sounds: Normal breath sounds.  Chest:     Comments: She is s.p bilateral mastectomy.  No palpable masses in the reconstructed area. No regional adenopathy. No cervical adenopathy Abdominal:     General: Abdomen is flat. There is no distension or abdominal bruit.  Neurological:     Mental Status: She is alert.       Lab Results  Component Value Date  WBC 5.6 04/15/2022   HGB 11.4 (L) 04/15/2022   HCT 35.1 (L) 04/15/2022   PLT 212 04/15/2022   GLUCOSE 112 (H) 04/15/2022   CHOL 204 (H)  08/28/2012   TRIG 91 08/28/2012   HDL 62 08/28/2012   LDLCALC 124 (H) 08/28/2012   ALT 19 04/13/2022   AST 25 04/13/2022   NA 140 04/15/2022   K 3.8 04/15/2022   CL 105 04/15/2022   CREATININE 0.63 04/15/2022   BUN 5 (L) 04/15/2022   CO2 25 04/15/2022    CT Abdomen Pelvis W Contrast  Result Date: 04/13/2022 CLINICAL DATA:  Abdominal pain, concern for bowel obstruction. Breast cancer, metastatic disease evaluation. * Tracking Code: BO * EXAM: CT CHEST, ABDOMEN, AND PELVIS WITH CONTRAST TECHNIQUE: Multidetector CT imaging of the chest, abdomen and pelvis was performed following the standard protocol during bolus administration of intravenous contrast. RADIATION DOSE REDUCTION: This exam was performed according to the departmental dose-optimization program which includes automated exposure control, adjustment of the mA and/or kV according to patient size and/or use of iterative reconstruction technique. CONTRAST:  156m OMNIPAQUE IOHEXOL 300 MG/ML  SOLN COMPARISON:  CT abdomen pelvis March 31, 2022. FINDINGS: CT CHEST FINDINGS Cardiovascular: Normal caliber thoracic aorta. Aortic atherosclerosis. Normal caliber central pulmonary arteries. Normal size heart. No significant pericardial effusion/thickening. Mediastinum/Nodes: No suspicious thyroid nodule. No pathologically enlarged mediastinal, hilar or axillary lymph nodes. Small amount of crescentic feathery soft tissue stranding in the anterior mediastinum for instance on image 25/2. Prior right axillary lymph node dissection. Lungs/Pleura: No aggressive lytic or blastic lesion of bone. Biapical pleuroparenchymal scarring. Left lower lobe pneumatocele. No pleural effusion. No pneumothorax. Musculoskeletal: Prior bilateral mastectomies with breast reconstruction and right axillary lymph node dissection. Sclerotic foci in the left humeral head on image 6/2 as well as on image 9/2. Sclerotic focus in the inferior endplate of T6, with associated endplate  degenerative change. CT ABDOMEN PELVIS FINDINGS Hepatobiliary: Hypodense hepatic lesions measure fluid density compatible with cysts. Additional hypodense hepatic lesions are technically too small to accurately characterize but stable from prior examination and also favored to reflect cysts. No solid enhancing hepatic lesion identified. Gallbladder is unremarkable. No biliary ductal dilation. Pancreas: No pancreatic ductal dilation or evidence of acute inflammation. Spleen: No splenomegaly. Adrenals/Urinary Tract: Bilateral adrenal glands appear normal. No hydronephrosis. Kidneys demonstrate symmetric enhancement. Urinary bladder is unremarkable for degree of distension Stomach/Bowel: Worsening dilation of small bowel loops measuring up to 4.3 cm in diameter on image 81/2 with abrupt transition to decompressed bowel on image 87/2 near the site of the mucosal hyperenhancement seen on CT enterography December 09, 2021. Distal to this there is another prominent fluid-filled loop of bowel in the pelvis measuring 2.2 cm also with abrupt transition to decompressed bowel in area of bowel wall thickening with adjacent pelvic free fluid. Additionally there are multifocal short and moderate length segments of small bowel wall thickening with adjacent mesenteric edema/stranding for instance in the left hemiabdomen on image 75/2. Normal appendix. Colon is predominately decompressed limiting evaluation. Vascular/Lymphatic: Aortic atherosclerosis. Normal caliber abdominal aorta. Smooth IVC contours. No pathologically enlarged abdominal or pelvic lymph nodes. Prominent ileocolic lymph nodes measure up to 6 mm in short axis on image 82/2 Reproductive: Uterus and bilateral adnexa are unremarkable. Other: Small volume mesenteric and pelvic free fluid. Musculoskeletal: Sclerotic focus in the right femoral head on image 67/5. Multilevel Schmorl's node formation with associated degenerative endplate changes. Lumbar spondylosis. Grade 1  L4 on L5 degenerative anterolisthesis. IMPRESSION: 1. Worsening  long segment of dilated fluid-filled small bowel with transition point in the right hemiabdomen similar in location to the area of mucosal hyperenhancement seen on prior imaging, compatible with worsening small bowel obstruction possibly related to an inflammatory or malignant stricture or adhesion. 2. Additionally distal to this there is a prominent fluid-filled loop of small bowel with abrupt transition to decompressed bowel with apparent wall thickening of this bowel similar to the more proximal section. Also there are multifocal short and moderate length segments of small bowel wall thickening with adjacent mesenteric edema/stranding, complex findings which may reflect multifocal disease from the above process, NSAID enteritis or superimposed infectious/inflammatory enteritis. 3. Prominent ileocolic lymph nodes measuring up to 6 mm in short axis, nonspecific but favored reactive. 4. Sclerotic foci in the left humeral head, T6 vertebral body and right femoral head are nonspecific but favored to reflect bone islands. Consider attention on nuclear medicine bone scan. 5. Small amount of crescentic feathery soft tissue stranding in the anterior mediastinum, nonspecific but favored to reflect thymic hyperplasia. 6. Prior bilateral mastectomies with breast reconstruction and right axillary lymph node dissection. 7. Hypodense hepatic lesions measure fluid density compatible with cysts. Additional hypodense hepatic lesions are technically too small to accurately characterize but also favored to reflect cysts. Attention on follow-up imaging suggested. 8.  Aortic Atherosclerosis (ICD10-I70.0). Electronically Signed   By: Dahlia Bailiff M.D.   On: 04/13/2022 16:08   CT Chest W Contrast  Result Date: 04/13/2022 CLINICAL DATA:  Abdominal pain, concern for bowel obstruction. Breast cancer, metastatic disease evaluation. * Tracking Code: BO * EXAM: CT CHEST,  ABDOMEN, AND PELVIS WITH CONTRAST TECHNIQUE: Multidetector CT imaging of the chest, abdomen and pelvis was performed following the standard protocol during bolus administration of intravenous contrast. RADIATION DOSE REDUCTION: This exam was performed according to the departmental dose-optimization program which includes automated exposure control, adjustment of the mA and/or kV according to patient size and/or use of iterative reconstruction technique. CONTRAST:  168m OMNIPAQUE IOHEXOL 300 MG/ML  SOLN COMPARISON:  CT abdomen pelvis March 31, 2022. FINDINGS: CT CHEST FINDINGS Cardiovascular: Normal caliber thoracic aorta. Aortic atherosclerosis. Normal caliber central pulmonary arteries. Normal size heart. No significant pericardial effusion/thickening. Mediastinum/Nodes: No suspicious thyroid nodule. No pathologically enlarged mediastinal, hilar or axillary lymph nodes. Small amount of crescentic feathery soft tissue stranding in the anterior mediastinum for instance on image 25/2. Prior right axillary lymph node dissection. Lungs/Pleura: No aggressive lytic or blastic lesion of bone. Biapical pleuroparenchymal scarring. Left lower lobe pneumatocele. No pleural effusion. No pneumothorax. Musculoskeletal: Prior bilateral mastectomies with breast reconstruction and right axillary lymph node dissection. Sclerotic foci in the left humeral head on image 6/2 as well as on image 9/2. Sclerotic focus in the inferior endplate of T6, with associated endplate degenerative change. CT ABDOMEN PELVIS FINDINGS Hepatobiliary: Hypodense hepatic lesions measure fluid density compatible with cysts. Additional hypodense hepatic lesions are technically too small to accurately characterize but stable from prior examination and also favored to reflect cysts. No solid enhancing hepatic lesion identified. Gallbladder is unremarkable. No biliary ductal dilation. Pancreas: No pancreatic ductal dilation or evidence of acute inflammation.  Spleen: No splenomegaly. Adrenals/Urinary Tract: Bilateral adrenal glands appear normal. No hydronephrosis. Kidneys demonstrate symmetric enhancement. Urinary bladder is unremarkable for degree of distension Stomach/Bowel: Worsening dilation of small bowel loops measuring up to 4.3 cm in diameter on image 81/2 with abrupt transition to decompressed bowel on image 87/2 near the site of the mucosal hyperenhancement seen on CT enterography December 09, 2021.  Distal to this there is another prominent fluid-filled loop of bowel in the pelvis measuring 2.2 cm also with abrupt transition to decompressed bowel in area of bowel wall thickening with adjacent pelvic free fluid. Additionally there are multifocal short and moderate length segments of small bowel wall thickening with adjacent mesenteric edema/stranding for instance in the left hemiabdomen on image 75/2. Normal appendix. Colon is predominately decompressed limiting evaluation. Vascular/Lymphatic: Aortic atherosclerosis. Normal caliber abdominal aorta. Smooth IVC contours. No pathologically enlarged abdominal or pelvic lymph nodes. Prominent ileocolic lymph nodes measure up to 6 mm in short axis on image 82/2 Reproductive: Uterus and bilateral adnexa are unremarkable. Other: Small volume mesenteric and pelvic free fluid. Musculoskeletal: Sclerotic focus in the right femoral head on image 67/5. Multilevel Schmorl's node formation with associated degenerative endplate changes. Lumbar spondylosis. Grade 1 L4 on L5 degenerative anterolisthesis. IMPRESSION: 1. Worsening long segment of dilated fluid-filled small bowel with transition point in the right hemiabdomen similar in location to the area of mucosal hyperenhancement seen on prior imaging, compatible with worsening small bowel obstruction possibly related to an inflammatory or malignant stricture or adhesion. 2. Additionally distal to this there is a prominent fluid-filled loop of small bowel with abrupt transition  to decompressed bowel with apparent wall thickening of this bowel similar to the more proximal section. Also there are multifocal short and moderate length segments of small bowel wall thickening with adjacent mesenteric edema/stranding, complex findings which may reflect multifocal disease from the above process, NSAID enteritis or superimposed infectious/inflammatory enteritis. 3. Prominent ileocolic lymph nodes measuring up to 6 mm in short axis, nonspecific but favored reactive. 4. Sclerotic foci in the left humeral head, T6 vertebral body and right femoral head are nonspecific but favored to reflect bone islands. Consider attention on nuclear medicine bone scan. 5. Small amount of crescentic feathery soft tissue stranding in the anterior mediastinum, nonspecific but favored to reflect thymic hyperplasia. 6. Prior bilateral mastectomies with breast reconstruction and right axillary lymph node dissection. 7. Hypodense hepatic lesions measure fluid density compatible with cysts. Additional hypodense hepatic lesions are technically too small to accurately characterize but also favored to reflect cysts. Attention on follow-up imaging suggested. 8.  Aortic Atherosclerosis (ICD10-I70.0). Electronically Signed   By: Dahlia Bailiff M.D.   On: 04/13/2022 16:08   CT Head Wo Contrast  Result Date: 04/13/2022 CLINICAL DATA:  Metastatic disease evaluation. EXAM: CT HEAD WITHOUT CONTRAST TECHNIQUE: Contiguous axial images were obtained from the base of the skull through the vertex without intravenous contrast. RADIATION DOSE REDUCTION: This exam was performed according to the departmental dose-optimization program which includes automated exposure control, adjustment of the mA and/or kV according to patient size and/or use of iterative reconstruction technique. COMPARISON:  None Available. FINDINGS: Brain: No acute hemorrhage, mass effect or midline shift. Gray-white differentiation is preserved. No hydrocephalus. No  extra-axial collection. Basilar cisterns are patent. Vascular: No hyperdense vessel or unexpected calcification. Skull: No calvarial fracture or suspicious bone lesion. Skull base is unremarkable. Sinuses/Orbits: Unremarkable. Other: None. IMPRESSION: No acute intracranial abnormality. Electronically Signed   By: Emmit Alexanders M.D.   On: 04/13/2022 15:25   DG Abdomen 1 View  Result Date: 04/01/2022 CLINICAL DATA:  NG tube placement EXAM: ABDOMEN - 1 VIEW COMPARISON:  CT abdomen and pelvis 03/31/2022 FINDINGS: Enteric tube tip and side-port in the stomach. Nonspecific bowel-gas pattern. Surgical clips right chest wall. The lungs are clear. IMPRESSION: Enteric tube tip and side port in the stomach. Electronically Signed   By:  Placido Sou M.D.   On: 04/01/2022 00:09   CT ABDOMEN PELVIS W CONTRAST  Result Date: 03/31/2022 CLINICAL DATA:  Generalized abdominal pain that started on the right side but is all of the abdomen. This happened a few months ago and was relieved with vomiting. EXAM: CT ABDOMEN AND PELVIS WITH CONTRAST TECHNIQUE: Multidetector CT imaging of the abdomen and pelvis was performed using the standard protocol following bolus administration of intravenous contrast. RADIATION DOSE REDUCTION: This exam was performed according to the departmental dose-optimization program which includes automated exposure control, adjustment of the mA and/or kV according to patient size and/or use of iterative reconstruction technique. CONTRAST:  35m OMNIPAQUE IOHEXOL 300 MG/ML  SOLN COMPARISON:  CT enterography 12/11/2021 FINDINGS: Lower chest: No acute abnormality. Hepatobiliary: No focal liver abnormality is seen. No gallstones, gallbladder wall thickening, or biliary dilatation. Pancreas: Unremarkable. No pancreatic ductal dilatation or surrounding inflammatory changes. Spleen: Normal in size without focal abnormality. Adrenals/Urinary Tract: Adrenal glands are unremarkable. Kidneys are normal, without  renal calculi, focal lesion, or hydronephrosis. Bladder is unremarkable. Stomach/Bowel: Normal caliber colon. Unremarkable stomach. Normal appendix. Dilated small bowel with abrupt transition point in the central mesentery (series 5/image 56. The small bowel downstream from the transition point is decompressed. There is fecalization of bowel contents within the dilated portion of small bowel. Question mild thickening of the small bowel wall at the site of obstruction as appreciated on sagittal images (series 6/image [id number]). Vascular/Lymphatic: No significant vascular findings are present. No enlarged abdominal or pelvic lymph nodes. Reproductive: Uterus and bilateral adnexa are unremarkable. Other: Small amount of free fluid in the pelvis. No free intraperitoneal air. Musculoskeletal: No acute osseous abnormality. IMPRESSION: High-grade small bowel obstruction with transition point in the central abdomen. The small bowel wall is thickened at the site of obstruction. This may be due to infection/inflammation however underlying neoplasm is not excluded. Electronically Signed   By: TPlacido SouM.D.   On: 03/31/2022 23:17    Pathology:As mentioned above.  CT Abdomen Pelvis W Contrast  Result Date: 04/13/2022 CLINICAL DATA:  Abdominal pain, concern for bowel obstruction. Breast cancer, metastatic disease evaluation. * Tracking Code: BO * EXAM: CT CHEST, ABDOMEN, AND PELVIS WITH CONTRAST TECHNIQUE: Multidetector CT imaging of the chest, abdomen and pelvis was performed following the standard protocol during bolus administration of intravenous contrast. RADIATION DOSE REDUCTION: This exam was performed according to the departmental dose-optimization program which includes automated exposure control, adjustment of the mA and/or kV according to patient size and/or use of iterative reconstruction technique. CONTRAST:  1069mOMNIPAQUE IOHEXOL 300 MG/ML  SOLN COMPARISON:  CT abdomen pelvis March 31, 2022. FINDINGS:  CT CHEST FINDINGS Cardiovascular: Normal caliber thoracic aorta. Aortic atherosclerosis. Normal caliber central pulmonary arteries. Normal size heart. No significant pericardial effusion/thickening. Mediastinum/Nodes: No suspicious thyroid nodule. No pathologically enlarged mediastinal, hilar or axillary lymph nodes. Small amount of crescentic feathery soft tissue stranding in the anterior mediastinum for instance on image 25/2. Prior right axillary lymph node dissection. Lungs/Pleura: No aggressive lytic or blastic lesion of bone. Biapical pleuroparenchymal scarring. Left lower lobe pneumatocele. No pleural effusion. No pneumothorax. Musculoskeletal: Prior bilateral mastectomies with breast reconstruction and right axillary lymph node dissection. Sclerotic foci in the left humeral head on image 6/2 as well as on image 9/2. Sclerotic focus in the inferior endplate of T6, with associated endplate degenerative change. CT ABDOMEN PELVIS FINDINGS Hepatobiliary: Hypodense hepatic lesions measure fluid density compatible with cysts. Additional hypodense hepatic lesions are technically too small to  accurately characterize but stable from prior examination and also favored to reflect cysts. No solid enhancing hepatic lesion identified. Gallbladder is unremarkable. No biliary ductal dilation. Pancreas: No pancreatic ductal dilation or evidence of acute inflammation. Spleen: No splenomegaly. Adrenals/Urinary Tract: Bilateral adrenal glands appear normal. No hydronephrosis. Kidneys demonstrate symmetric enhancement. Urinary bladder is unremarkable for degree of distension Stomach/Bowel: Worsening dilation of small bowel loops measuring up to 4.3 cm in diameter on image 81/2 with abrupt transition to decompressed bowel on image 87/2 near the site of the mucosal hyperenhancement seen on CT enterography December 09, 2021. Distal to this there is another prominent fluid-filled loop of bowel in the pelvis measuring 2.2 cm also with  abrupt transition to decompressed bowel in area of bowel wall thickening with adjacent pelvic free fluid. Additionally there are multifocal short and moderate length segments of small bowel wall thickening with adjacent mesenteric edema/stranding for instance in the left hemiabdomen on image 75/2. Normal appendix. Colon is predominately decompressed limiting evaluation. Vascular/Lymphatic: Aortic atherosclerosis. Normal caliber abdominal aorta. Smooth IVC contours. No pathologically enlarged abdominal or pelvic lymph nodes. Prominent ileocolic lymph nodes measure up to 6 mm in short axis on image 82/2 Reproductive: Uterus and bilateral adnexa are unremarkable. Other: Small volume mesenteric and pelvic free fluid. Musculoskeletal: Sclerotic focus in the right femoral head on image 67/5. Multilevel Schmorl's node formation with associated degenerative endplate changes. Lumbar spondylosis. Grade 1 L4 on L5 degenerative anterolisthesis. IMPRESSION: 1. Worsening long segment of dilated fluid-filled small bowel with transition point in the right hemiabdomen similar in location to the area of mucosal hyperenhancement seen on prior imaging, compatible with worsening small bowel obstruction possibly related to an inflammatory or malignant stricture or adhesion. 2. Additionally distal to this there is a prominent fluid-filled loop of small bowel with abrupt transition to decompressed bowel with apparent wall thickening of this bowel similar to the more proximal section. Also there are multifocal short and moderate length segments of small bowel wall thickening with adjacent mesenteric edema/stranding, complex findings which may reflect multifocal disease from the above process, NSAID enteritis or superimposed infectious/inflammatory enteritis. 3. Prominent ileocolic lymph nodes measuring up to 6 mm in short axis, nonspecific but favored reactive. 4. Sclerotic foci in the left humeral head, T6 vertebral body and right femoral  head are nonspecific but favored to reflect bone islands. Consider attention on nuclear medicine bone scan. 5. Small amount of crescentic feathery soft tissue stranding in the anterior mediastinum, nonspecific but favored to reflect thymic hyperplasia. 6. Prior bilateral mastectomies with breast reconstruction and right axillary lymph node dissection. 7. Hypodense hepatic lesions measure fluid density compatible with cysts. Additional hypodense hepatic lesions are technically too small to accurately characterize but also favored to reflect cysts. Attention on follow-up imaging suggested. 8.  Aortic Atherosclerosis (ICD10-I70.0). Electronically Signed   By: Dahlia Bailiff M.D.   On: 04/13/2022 16:08   CT Chest W Contrast  Result Date: 04/13/2022 CLINICAL DATA:  Abdominal pain, concern for bowel obstruction. Breast cancer, metastatic disease evaluation. * Tracking Code: BO * EXAM: CT CHEST, ABDOMEN, AND PELVIS WITH CONTRAST TECHNIQUE: Multidetector CT imaging of the chest, abdomen and pelvis was performed following the standard protocol during bolus administration of intravenous contrast. RADIATION DOSE REDUCTION: This exam was performed according to the departmental dose-optimization program which includes automated exposure control, adjustment of the mA and/or kV according to patient size and/or use of iterative reconstruction technique. CONTRAST:  111m OMNIPAQUE IOHEXOL 300 MG/ML  SOLN COMPARISON:  CT abdomen  pelvis March 31, 2022. FINDINGS: CT CHEST FINDINGS Cardiovascular: Normal caliber thoracic aorta. Aortic atherosclerosis. Normal caliber central pulmonary arteries. Normal size heart. No significant pericardial effusion/thickening. Mediastinum/Nodes: No suspicious thyroid nodule. No pathologically enlarged mediastinal, hilar or axillary lymph nodes. Small amount of crescentic feathery soft tissue stranding in the anterior mediastinum for instance on image 25/2. Prior right axillary lymph node  dissection. Lungs/Pleura: No aggressive lytic or blastic lesion of bone. Biapical pleuroparenchymal scarring. Left lower lobe pneumatocele. No pleural effusion. No pneumothorax. Musculoskeletal: Prior bilateral mastectomies with breast reconstruction and right axillary lymph node dissection. Sclerotic foci in the left humeral head on image 6/2 as well as on image 9/2. Sclerotic focus in the inferior endplate of T6, with associated endplate degenerative change. CT ABDOMEN PELVIS FINDINGS Hepatobiliary: Hypodense hepatic lesions measure fluid density compatible with cysts. Additional hypodense hepatic lesions are technically too small to accurately characterize but stable from prior examination and also favored to reflect cysts. No solid enhancing hepatic lesion identified. Gallbladder is unremarkable. No biliary ductal dilation. Pancreas: No pancreatic ductal dilation or evidence of acute inflammation. Spleen: No splenomegaly. Adrenals/Urinary Tract: Bilateral adrenal glands appear normal. No hydronephrosis. Kidneys demonstrate symmetric enhancement. Urinary bladder is unremarkable for degree of distension Stomach/Bowel: Worsening dilation of small bowel loops measuring up to 4.3 cm in diameter on image 81/2 with abrupt transition to decompressed bowel on image 87/2 near the site of the mucosal hyperenhancement seen on CT enterography December 09, 2021. Distal to this there is another prominent fluid-filled loop of bowel in the pelvis measuring 2.2 cm also with abrupt transition to decompressed bowel in area of bowel wall thickening with adjacent pelvic free fluid. Additionally there are multifocal short and moderate length segments of small bowel wall thickening with adjacent mesenteric edema/stranding for instance in the left hemiabdomen on image 75/2. Normal appendix. Colon is predominately decompressed limiting evaluation. Vascular/Lymphatic: Aortic atherosclerosis. Normal caliber abdominal aorta. Smooth IVC  contours. No pathologically enlarged abdominal or pelvic lymph nodes. Prominent ileocolic lymph nodes measure up to 6 mm in short axis on image 82/2 Reproductive: Uterus and bilateral adnexa are unremarkable. Other: Small volume mesenteric and pelvic free fluid. Musculoskeletal: Sclerotic focus in the right femoral head on image 67/5. Multilevel Schmorl's node formation with associated degenerative endplate changes. Lumbar spondylosis. Grade 1 L4 on L5 degenerative anterolisthesis. IMPRESSION: 1. Worsening long segment of dilated fluid-filled small bowel with transition point in the right hemiabdomen similar in location to the area of mucosal hyperenhancement seen on prior imaging, compatible with worsening small bowel obstruction possibly related to an inflammatory or malignant stricture or adhesion. 2. Additionally distal to this there is a prominent fluid-filled loop of small bowel with abrupt transition to decompressed bowel with apparent wall thickening of this bowel similar to the more proximal section. Also there are multifocal short and moderate length segments of small bowel wall thickening with adjacent mesenteric edema/stranding, complex findings which may reflect multifocal disease from the above process, NSAID enteritis or superimposed infectious/inflammatory enteritis. 3. Prominent ileocolic lymph nodes measuring up to 6 mm in short axis, nonspecific but favored reactive. 4. Sclerotic foci in the left humeral head, T6 vertebral body and right femoral head are nonspecific but favored to reflect bone islands. Consider attention on nuclear medicine bone scan. 5. Small amount of crescentic feathery soft tissue stranding in the anterior mediastinum, nonspecific but favored to reflect thymic hyperplasia. 6. Prior bilateral mastectomies with breast reconstruction and right axillary lymph node dissection. 7. Hypodense hepatic lesions measure fluid density  compatible with cysts. Additional hypodense hepatic  lesions are technically too small to accurately characterize but also favored to reflect cysts. Attention on follow-up imaging suggested. 8.  Aortic Atherosclerosis (ICD10-I70.0). Electronically Signed   By: Dahlia Bailiff M.D.   On: 04/13/2022 16:08   CT Head Wo Contrast  Result Date: 04/13/2022 CLINICAL DATA:  Metastatic disease evaluation. EXAM: CT HEAD WITHOUT CONTRAST TECHNIQUE: Contiguous axial images were obtained from the base of the skull through the vertex without intravenous contrast. RADIATION DOSE REDUCTION: This exam was performed according to the departmental dose-optimization program which includes automated exposure control, adjustment of the mA and/or kV according to patient size and/or use of iterative reconstruction technique. COMPARISON:  None Available. FINDINGS: Brain: No acute hemorrhage, mass effect or midline shift. Gray-white differentiation is preserved. No hydrocephalus. No extra-axial collection. Basilar cisterns are patent. Vascular: No hyperdense vessel or unexpected calcification. Skull: No calvarial fracture or suspicious bone lesion. Skull base is unremarkable. Sinuses/Orbits: Unremarkable. Other: None. IMPRESSION: No acute intracranial abnormality. Electronically Signed   By: Emmit Alexanders M.D.   On: 04/13/2022 15:25   DG Abdomen 1 View  Result Date: 04/01/2022 CLINICAL DATA:  NG tube placement EXAM: ABDOMEN - 1 VIEW COMPARISON:  CT abdomen and pelvis 03/31/2022 FINDINGS: Enteric tube tip and side-port in the stomach. Nonspecific bowel-gas pattern. Surgical clips right chest wall. The lungs are clear. IMPRESSION: Enteric tube tip and side port in the stomach. Electronically Signed   By: Placido Sou M.D.   On: 04/01/2022 00:09   CT ABDOMEN PELVIS W CONTRAST  Result Date: 03/31/2022 CLINICAL DATA:  Generalized abdominal pain that started on the right side but is all of the abdomen. This happened a few months ago and was relieved with vomiting. EXAM: CT ABDOMEN AND  PELVIS WITH CONTRAST TECHNIQUE: Multidetector CT imaging of the abdomen and pelvis was performed using the standard protocol following bolus administration of intravenous contrast. RADIATION DOSE REDUCTION: This exam was performed according to the departmental dose-optimization program which includes automated exposure control, adjustment of the mA and/or kV according to patient size and/or use of iterative reconstruction technique. CONTRAST:  87m OMNIPAQUE IOHEXOL 300 MG/ML  SOLN COMPARISON:  CT enterography 12/11/2021 FINDINGS: Lower chest: No acute abnormality. Hepatobiliary: No focal liver abnormality is seen. No gallstones, gallbladder wall thickening, or biliary dilatation. Pancreas: Unremarkable. No pancreatic ductal dilatation or surrounding inflammatory changes. Spleen: Normal in size without focal abnormality. Adrenals/Urinary Tract: Adrenal glands are unremarkable. Kidneys are normal, without renal calculi, focal lesion, or hydronephrosis. Bladder is unremarkable. Stomach/Bowel: Normal caliber colon. Unremarkable stomach. Normal appendix. Dilated small bowel with abrupt transition point in the central mesentery (series 5/image 56. The small bowel downstream from the transition point is decompressed. There is fecalization of bowel contents within the dilated portion of small bowel. Question mild thickening of the small bowel wall at the site of obstruction as appreciated on sagittal images (series 6/image 68-72). Vascular/Lymphatic: No significant vascular findings are present. No enlarged abdominal or pelvic lymph nodes. Reproductive: Uterus and bilateral adnexa are unremarkable. Other: Small amount of free fluid in the pelvis. No free intraperitoneal air. Musculoskeletal: No acute osseous abnormality. IMPRESSION: High-grade small bowel obstruction with transition point in the central abdomen. The small bowel wall is thickened at the site of obstruction. This may be due to infection/inflammation however  underlying neoplasm is not excluded. Electronically Signed   By: TPlacido SouM.D.   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  Also the location of her disease seems to be highly unusual for breast cancer metastasis. Given poorly differentiated carcinoma, it may be best to review the pathology again for accurate diagnosis.  We will attempt to get in touch with [facility] since our omental biopsy here is negative. It is also reasonable to consider outpatient PET for any additional disease that we can biopsy.  I have discussed all the above-mentioned findings with her.  She understands that this is unfortunately advanced cancer if this is indeed metastatic breast cancer. I will see her back in clinic in about a week to 10 days hoping to get the PET before visit.  If this is indeed a GI primary, I will engage her with one of her colleagues who sees most of her GI cancers.  She is also aware of this.  Thank you for consulting [place] in the care of this patient.  Please not hesitate to contact us with any additional questions or concerns.  I have spoken to the surgeon, hospitalist, patient, family reviewed her old records and spent about 80 minutes in the care of this patient    Thank you for this referral.

## 2022-04-16 NOTE — Progress Notes (Signed)
Nurse reviewed discharge instructions with pt. Pt verbalized understanding of discharge instructions, follow up appointments and new medication.  Pt and her husband showed how to flush and vent G-tube.  Both feel comfortable with G-tube care at home.  No concerns at time of discharge.

## 2022-04-16 NOTE — Progress Notes (Signed)
2 Days Post-Op  Subjective: Doing well. Soreness around g-tube and incisions that is well controlled with medications. Tolerating FLD and hoping to go home today. Did speak with oncology yesterday evening.   Objective: Vital signs in last 24 hours: Temp:  [97.5 F (36.4 C)-98.2 F (36.8 C)] 98 F (36.7 C) (02/16 0457) Pulse Rate:  [59-77] 59 (02/16 0457) Resp:  [16-18] 16 (02/16 0457) BP: (106-127)/(70-87) 106/71 (02/16 0457) SpO2:  [92 %-100 %] 94 % (02/16 0457) Last BM Date : 04/14/22  Intake/Output from previous day: 02/15 0701 - 02/16 0700 In: 3336.9 [P.O.:1490; I.V.:1846.9] Out: 3200 [Urine:3200] Intake/Output this shift: Total I/O In: -  Out: 400 [Urine:400]  PE:  Gen:  Alert, NAD, pleasant Abd: Soft, mild distension, appropriately tender around laparoscopic incisions and G-tube, no rigidity or guarding and otherwise NT, +BS. Incisions with glue intact appears well and are without drainage, bleeding, or signs of infection. G-tube clamped. G-tube site with dressing in place over, cdi Psych: A&Ox3   Lab Results:  Recent Labs    04/14/22 0436 04/15/22 0412  WBC 6.4 5.6  HGB 12.2 11.4*  HCT 37.4 35.1*  PLT 249 212    BMET Recent Labs    04/14/22 0436 04/15/22 0412  NA 138 140  K 3.6 3.8  CL 104 105  CO2 23 25  GLUCOSE 122* 112*  BUN 8 5*  CREATININE 0.75 0.63  CALCIUM 8.7* 8.6*    PT/INR No results for input(s): "LABPROT", "INR" in the last 72 hours. CMP     Component Value Date/Time   NA 140 04/15/2022 0412   K 3.8 04/15/2022 0412   CL 105 04/15/2022 0412   CO2 25 04/15/2022 0412   GLUCOSE 112 (H) 04/15/2022 0412   BUN 5 (L) 04/15/2022 0412   CREATININE 0.63 04/15/2022 0412   CREATININE 0.91 01/17/2019 1301   CREATININE 0.66 08/28/2012 1400   CALCIUM 8.6 (L) 04/15/2022 0412   PROT 6.9 04/13/2022 1016   ALBUMIN 4.0 04/13/2022 1016   AST 25 04/13/2022 1016   AST 22 01/17/2019 1301   ALT 19 04/13/2022 1016   ALT 17 01/17/2019 1301    ALKPHOS 62 04/13/2022 1016   BILITOT 1.1 04/13/2022 1016   BILITOT 0.9 01/17/2019 1301   GFRNONAA >60 04/15/2022 0412   GFRNONAA >60 01/17/2019 1301   GFRNONAA >89 08/28/2012 1400   GFRAA >60 01/17/2019 1301   GFRAA >89 08/28/2012 1400   Lipase     Component Value Date/Time   LIPASE 31 04/13/2022 1016    Studies/Results: No results found.  Anti-infectives: Anti-infectives (From admission, onward)    Start     Dose/Rate Route Frequency Ordered Stop   04/14/22 1100  cefOXitin (MEFOXIN) 2 g in sodium chloride 0.9 % 100 mL IVPB        2 g 200 mL/hr over 30 Minutes Intravenous On call to O.R. 04/14/22 1004 04/14/22 1253        Assessment/Plan POD2 s/p diagnostic laparoscopic, LOA, omental bx, laparoscopic G-tube placement by Dr. 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  - Cytology pending. Omental bx negative for malignancy  - continue FLD + Protein shakes with G-tube clamped. Can vent as needed. Would leave on FLD + shakes at d/c. - follow up outpatient with [facility] and agree with PET next week  - stable for DC from a surgical standpoint   FEN - FLD +  Shakes with G-Tube clamped. IVF per [facility] VTE - SCDs, okay for chemical ppx from a general surgery standpoint  ID - Cefoxitin peri-op Foley - None, voiding.     LOS: 3 days    Norm Parcel , [facility] Surgery 04/16/2022, 9:54 AM Please see Amion for pager number during day hours 7:00am-4:30pm

## 2022-04-16 NOTE — Discharge Instructions (Signed)
[facility] LAPAROSCOPIC SURGERY: POST OP INSTRUCTIONS Always review your discharge instruction sheet given to you by the facility where your surgery was performed. IF YOU HAVE DISABILITY OR FAMILY LEAVE FORMS, YOU MUST BRING THEM TO THE OFFICE FOR PROCESSING.   DO NOT GIVE THEM TO YOUR DOCTOR.  PAIN CONTROL  First take acetaminophen (Tylenol) AND/or ibuprofen (Advil) to control your pain after surgery.  Follow directions on package.  Taking acetaminophen (Tylenol) and/or ibuprofen (Advil) regularly after surgery will help to control your pain and lower the amount of prescription pain medication you may need.  You should not take more than 3,000 mg (3 grams) of acetaminophen (Tylenol) in 24 hours.  You should not take ibuprofen (Advil), aleve, motrin, naprosyn or other NSAIDS if you have a history of stomach ulcers or chronic kidney disease.  A prescription for pain medication may be given to you upon discharge.  Take your pain medication as prescribed, if you still have uncontrolled pain after taking acetaminophen (Tylenol) or ibuprofen (Advil). Use ice packs to help control pain. If you need a refill on your pain medication, please contact your pharmacy.  They will contact our office to request authorization. Prescriptions will not be filled after 5pm or on week-ends.  HOME MEDICATIONS Take your usually prescribed medications unless otherwise directed.  DIET You should follow a light diet the first few days after arrival home.  Be sure to include lots of fluids daily. Avoid fatty, fried foods.   CONSTIPATION It is common to experience some constipation after surgery and if you are taking pain medication.  Increasing fluid intake and taking a stool softener (such as Colace) will usually help or prevent this problem from occurring.  A mild laxative (Milk of Magnesia or Miralax) should be taken according to package instructions if there are no bowel movements after 48  hours.  WOUND/INCISION CARE Most patients will experience some swelling and bruising in the area of the incisions.  Ice packs will help.  Swelling and bruising can take several days to resolve.  Unless discharge instructions indicate otherwise, follow guidelines below  STERI-STRIPS - you may remove your outer bandages 48 hours after surgery, and you may shower at that time.  You have steri-strips (small skin tapes) in place directly over the incision.  These strips should be left on the skin for 7-10 days.   DERMABOND/SKIN GLUE - you may shower in 24 hours.  The glue will flake off over the next 2-3 weeks. Any sutures or staples will be removed at the office during your follow-up visit.  ACTIVITIES You may resume regular (light) daily activities beginning the next day--such as daily self-care, walking, climbing stairs--gradually increasing activities as tolerated.  You may have sexual intercourse when it is comfortable.  Refrain from any heavy lifting or straining until approved by your doctor. You may drive when you are no longer taking prescription pain medication, you can comfortably wear a seatbelt, and you can safely maneuver your car and apply brakes.  FOLLOW-UP You should see your doctor in the office for a follow-up appointment approximately 2-3 weeks after your surgery.  You should have been given your post-op/follow-up appointment when your surgery was scheduled.  If you did not receive a post-op/follow-up appointment, make sure that you call for this appointment within a day or two after you arrive home to insure a convenient appointment time.   WHEN TO CALL YOUR DOCTOR: Fever over 101.0 Inability to urinate Continued bleeding from incision.   Increased pain, redness, or drainage from the incision. Increasing abdominal pain  The clinic staff is available to answer your questions during regular business hours.  Please don't hesitate to call and ask to speak to one of the nurses for  clinical concerns.  If you have a medical emergency, go to the nearest emergency room or call 911.  A surgeon from [facility] is always on call at the hospital. [address], [address], [place], [place]  [address] ? [address] [address], [place], [place]   [address] [phone] ? [phone] ? FAX [phone] Web site: [email or web address]  

## 2022-04-30 ENCOUNTER — Encounter (HOSPITAL_COMMUNITY)
Admission: RE | Admit: 2022-04-30 | Discharge: 2022-04-30 | Disposition: A | Discharge status: Home / Self Care | Payer: PPO | Source: Ambulatory Visit | Attending: Hematology and Oncology | Admitting: Hematology and Oncology

## 2022-04-30 DIAGNOSIS — C799 Secondary malignant neoplasm of unspecified site: Secondary | ICD-10-CM | POA: Diagnosis present

## 2022-04-30 LAB — GLUCOSE, CAPILLARY: Glucose-Capillary: 100 mg/dL — ABNORMAL HIGH (ref 70–99)

## 2022-04-30 MED ORDER — FLUDEOXYGLUCOSE F - 18 (FDG) INJECTION
7.4000 | Freq: Once | INTRAVENOUS | Status: AC | PRN
  Administered 2022-04-30: 7.4 via INTRAVENOUS

## 2022-05-03 ENCOUNTER — Inpatient Hospital Stay: Payer: PPO

## 2022-05-03 ENCOUNTER — Inpatient Hospital Stay: Payer: PPO | Attending: Hematology and Oncology | Admitting: Hematology and Oncology

## 2022-05-03 VITALS — BP 127/86 | HR 75 | Temp 97.7°F | Resp 16 | Ht 67.0 in | Wt 146.9 lb

## 2022-05-03 DIAGNOSIS — D378 Neoplasm of uncertain behavior of other specified digestive organs: Secondary | ICD-10-CM | POA: Diagnosis not present

## 2022-05-03 DIAGNOSIS — K6389 Other specified diseases of intestine: Secondary | ICD-10-CM | POA: Diagnosis present

## 2022-05-03 DIAGNOSIS — C799 Secondary malignant neoplasm of unspecified site: Secondary | ICD-10-CM | POA: Diagnosis not present

## 2022-05-03 DIAGNOSIS — Z86000 Personal history of in-situ neoplasm of breast: Secondary | ICD-10-CM | POA: Insufficient documentation

## 2022-05-03 DIAGNOSIS — Z9013 Acquired absence of bilateral breasts and nipples: Secondary | ICD-10-CM | POA: Insufficient documentation

## 2022-05-03 DIAGNOSIS — Z853 Personal history of malignant neoplasm of breast: Secondary | ICD-10-CM | POA: Insufficient documentation

## 2022-05-03 DIAGNOSIS — R933 Abnormal findings on diagnostic imaging of other parts of digestive tract: Secondary | ICD-10-CM | POA: Diagnosis not present

## 2022-05-03 DIAGNOSIS — Z79899 Other long term (current) drug therapy: Secondary | ICD-10-CM | POA: Insufficient documentation

## 2022-05-03 LAB — CEA (ACCESS): CEA (CHCC): 1.36 ng/mL (ref 0.00–5.00)

## 2022-05-03 NOTE — Progress Notes (Signed)
[place] FOLLOW UP NOTE  Patient Care Team: Chesley Noon, MD as PCP - General (Family Medicine) Mauro Kaufmann, RN as Oncology Nurse Navigator Rockwell Germany, RN as Oncology Nurse Navigator [name], MD as Consulting Physician (General Surgery) Eppie Gibson, MD as Attending Physician (Radiation Oncology) Juanita Craver, MD as Consulting Physician (Gastroenterology) Allyn Kenner, MD as Consulting Physician (Dermatology) Benay Pike, MD as Consulting Physician (Hematology and Oncology)  CHIEF COMPLAINTS/PURPOSE OF CONSULTATION:  Small bowel poorly diff carcinoma.  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  It is highly unusual to see a microinvasive breast cancer and predominantly DCIS with negative margins negative lymph nodes to present with metastatic disease.  Also the location of her disease seems to be highly unusual for breast cancer metastasis. Given poorly differentiated carcinoma, I spoke to [facility] pathology team who  suggested that the carcinoma has not arisen in small bowel, however it was very poorly differentiated and given her history of breast cancer they wondered if this could be related to breast cancer.  The pathologist is unable to assist with primary and suggested that we correlate clinically.  Outpatient PET/CT showed 2 short segments of small bowel with mild wall thickening and increased radiotracer uptake within the left upper quadrant of abdominal and lower pelvis.  Findings may represent known small bowel metastasis or additional sites of disease  We also drew some tumor markers today. At this time we have discussed that this is likely oligometastatic disease since the pathologist was mostly certain that this has not arisen from the small bowel.  I have discussed with the surgeon and he is agreeable to resect this area of small bowel.  We will also send the tumor for cancer ID typing in the interim.  Postop, we may consider adjuvant chemotherapy will try to choose a regimen that is active against many tumors for about 6 cycles or so followed by surveillance.  Patient is very much agreeable to this plan. We will also try to present her in TB   Thank you for consulting [place] in the care of this patient.  Please do not hesitate to contact us with any additional questions or concerns.  HISTORY OF PRESENTING ILLNESS:  [name] 67 y.o. 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  This is a very  pleasant 67 year old postmenopausal female patient who was previously seen by Dr. 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  There was no evidence of malignancy in the left breast.  In the right breast there was a microinvasive ductal  carcinoma measuring less than 0.1 cm grade 1 in the setting of DCIS with negative margins.  4 right axillary lymph nodes were removed all benign.  She then had breast reconstruction.  Since she did not need antiestrogen therapy, she was not followed in the breast oncology clinic. She first noticed some blood in her stool back in November 2022, followed up with Dr. Collene Mares from gastroenterology and had a colonoscopy which was unremarkable however Dr. Collene Mares recommended further evaluation with imaging.  She then had some CT abdomen which showed no definitive abnormality.  She also started having some abdominal pains which she describes as a spasm happens intermittently started around 2 to 3 months ago but have been more frequent in onset. She was referred to [facility] for ongoing abdominal pain.     She had enteroscopy which showed discrete focal segment of abnormal jejunum these findings are concerning for lymphoma or for other infiltrative malignancy.  She then had small bowel mass biopsy.  The enteroscopy showed normal esophagus, stomach and examined duodenum.  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  Histology showed sheets to partially nested neoplastic cells with expression of CAM5.2 and GATA3.  Since she had a history of breast cancer it was thought that this could represent metastatic breast cancer.   She was also taken to the OR on her most recent visit given worsening abdominal pain.  According to the surgeon there was 2 small bowel masses 1 about 20 cm from the ligament of Treitz this was biopsied recently.  There is another 1 approximately 100 cm from the terminal ileum bowel turned back on itself in this area and a band of omentum stuck to the bowel which was divided.  Not sure if the band or the mass was causing obstruction but the bowel proximal to this area was dilated and the bowel distal appeared normal.  I had a conversation with the surgeon and he wondered if the band was problem  causing the obstruction.   The biopsied omentum showed benign omentum, negative for malignancy. Cytology is negative.    INTERIM HISTORY  She is here after the recent hospitalization.  She is doing tremendously better.  She is able to eat soft foods and soups.  No abdominal pain.  She is very thankful for the surgery.  REVIEW OF SYSTEMS:   Constitutional: Denies fevers, chills or abnormal night sweats Eyes: Denies blurriness of vision, double vision or watery eyes Ears, nose, mouth, throat, and face: Denies mucositis or sore throat Respiratory: Denies cough, dyspnea or wheezes Cardiovascular: Denies palpitation, chest discomfort or lower extremity swelling Gastrointestinal:  Denies nausea, heartburn or change in bowel habits Skin: Denies abnormal skin rashes Lymphatics: Denies new lymphadenopathy or easy bruising Neurological:Denies numbness, tingling or new weaknesses Behavioral/Psych: Mood is stable, no new changes  All other systems were reviewed with the patient and are negative.   MEDICAL HISTORY:  Past Medical History:  Diagnosis Date   Arthritis    Backache, unspecified    Cancer ([place])    breast s/p BL mastectomies   Colon  polyps    Family history of breast cancer 10/27/2021   Family history of colon cancer 10/27/2021   Hyperlipidemia    PONV (postoperative nausea and vomiting)     SURGICAL HISTORY: Past Surgical History:  Procedure Laterality Date   BREAST RECONSTRUCTION WITH PLACEMENT OF TISSUE EXPANDER AND FLEX HD (ACELLULAR HYDRATED DERMIS) Bilateral 04/05/2019   Procedure: BILATERAL BREAST RECONSTRUCTION WITH PLACEMENT OF TISSUE EXPANDER AND FLEX HD (ACELLULAR HYDRATED DERMIS);  Surgeon: Wallace Going, DO;  Location: [place];  Service: Plastics;  Laterality: Bilateral;   ENDOMETRIAL ABLATION  1998   GIVENS CAPSULE STUDY N/A 12/04/2021   Procedure: GIVENS CAPSULE STUDY;  Surgeon: Juanita Craver, MD;  Location: [place];  Service:  Gastroenterology;  Laterality: N/A;   KNEE SURGERY Right 2007   LAPAROSCOPY N/A 04/14/2022   Procedure: LAPAROSCOPY DIAGNOSTIC, G-TUBE PLACEMENT; OMENTUM BIOPSY; LYSIS OF ADHESIONS;  Surgeon: Felicie Morn, MD;  Location: [facility];  Service: General;  Laterality: N/A;   LIPOSUCTION WITH LIPOFILLING Bilateral 01/30/2020   Procedure: LIPOSUCTION WITH LIPOFILLING;  Surgeon: Wallace Going, DO;  Location: [place];  Service: Plastics;  Laterality: Bilateral;  90 min, please   MASTECTOMY W/ SENTINEL NODE BIOPSY Bilateral 04/05/2019   Procedure: BILATERAL MASTECTOMIES WITH RIGHT SENTINEL LYMPH NODE BIOPSY;  Surgeon: [name], MD;  Location: [place];  Service: General;  Laterality: Bilateral;   REMOVAL OF BILATERAL TISSUE EXPANDERS WITH PLACEMENT OF BILATERAL BREAST IMPLANTS Bilateral 09/26/2019   Procedure: REMOVAL OF BILATERAL TISSUE EXPANDERS WITH PLACEMENT OF BILATERAL BREAST IMPLANTS;  Surgeon: Wallace Going, DO;  Location: [place];  Service: Plastics;  Laterality: Bilateral;   RHINOPLASTY  1977   TONSILLECTOMY  1961    SOCIAL HISTORY: Social History   Socioeconomic History   Marital status: Married    Spouse name: Psychologist, prison and probation services   Number of children: 3   Years of education: 16+   Highest education level: Not on file  Occupational History    Employer: [employer]   Occupation: Medical illustrator  Tobacco Use   Smoking status: Never   Smokeless tobacco: Never  Vaping Use   Vaping Use: Never used  Substance and Sexual Activity   Alcohol use: Yes    Comment: 2-3/week   Drug use: No   Sexual activity: Yes    Partners: Male  Other Topics Concern   Not on file  Social History Narrative   Marital Status:  Married Psychologist, counselling)    Children:  G3 P3 Daughters (Lauren, Sherran Needs, [place])    Pets:  Poodle (Mollie); Cats Eduard Clos, Shirlee [place])    Living Situation: Lives with spouse.  Her daughters live in [place].    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[employer])    Education:  Facilities manager)    Tobacco Use/Exposure:  None    Alcohol Use:  Occasional   Drug Use:  None   Diet:  Regular   Exercise:  Walking   Hobbies:  Gardening, Actor              Social Determinants of Health   Financial Resource Strain: Not on file  Food Insecurity: No Food Insecurity (04/13/2022)   Hunger Vital Sign    Worried About Running Out of Food in the Last Year: Never true    [place] in the Last Year: Never true  Transportation Needs: No Transportation Needs (04/13/2022)  PRAPARE - Hydrologist (Medical): No    Lack of Transportation (Non-Medical): No  Physical Activity: Not on file  Stress: Not on file  Social Connections: Not on file  Intimate Partner Violence: Not At Risk (04/13/2022)   Humiliation, Afraid, Rape, and Kick questionnaire    Fear of Current or Ex-Partner: No    Emotionally Abused: No    Physically Abused: No    Sexually Abused: No    FAMILY HISTORY: Family History  Problem Relation Age of Onset   Arthritis Mother    Colon cancer Mother 44   Melanoma Father        dx 25s; nose   Lung cancer Paternal Uncle        dx after 53; smoking hx   Breast cancer Maternal Grandmother        dx 60s   Hyperlipidemia Paternal Grandmother    Cancer Paternal Grandfather        unknown type; d. before 80   Colon cancer Other        MGM's mother    ALLERGIES:  has no allergies on file.  MEDICATIONS:  Current Outpatient Medications  Medication Sig Dispense Refill   acetaminophen (TYLENOL) 500 MG tablet Take 2 tablets (1,000 mg total) by mouth every 6 (six) hours as needed for moderate pain. 30 tablet 0   calcium carbonate (OS-CAL - DOSED IN MG OF ELEMENTAL CALCIUM) 1250 (500 Ca) MG tablet Take 1 tablet by mouth 2 (two) times daily with a meal.     Calcium Carbonate Antacid (TUMS PO) Take 3-4 tablets by  mouth as needed (heartburn).     Cholecalciferol (VITAMIN D3) 125 MCG (5000 UT) CAPS Take 5,000 Units by mouth every Monday.     cycloSPORINE (RESTASIS) 0.05 % ophthalmic emulsion Place 1 drop into both eyes 2 (two) times daily.     Ensure Max Protein (ENSURE MAX PROTEIN) LIQD Take 330 mLs (11 oz total) by mouth 3 (three) times daily.     famotidine (PEPCID) 40 MG tablet Take 40 mg by mouth daily as needed for heartburn or indigestion.     Melatonin 10 MG TABS Take 10 mg by mouth at bedtime.     Multiple Vitamin (MULTI-VITAMIN DAILY PO) Take 1 tablet by mouth daily.     omega-3 acid ethyl esters (LOVAZA) 1 g capsule Take 1 g by mouth 2 (two) times daily.     ondansetron (ZOFRAN-ODT) 4 MG disintegrating tablet Take 1 tablet (4 mg total) by mouth every 6 (six) hours as needed for nausea. 20 tablet 0   rosuvastatin (CRESTOR) 10 MG tablet Take 10 mg by mouth at bedtime.     No current facility-administered medications for this visit.   PHYSICAL EXAMINATION: ECOG PERFORMANCE STATUS: 0 - Asymptomatic  Vitals:   05/03/22 0932  BP: 127/86  Pulse: 75  Resp: 16  Temp: 97.7 F (36.5 C)  SpO2: 97%   Filed Weights   05/03/22 0932  Weight: 146 lb 14.4 oz (66.6 kg)    GENERAL:alert, no distress and comfortable Rest of PE Deferred in lieu of counseling  LABORATORY DATA:  I have reviewed the data as listed Lab Results  Component Value Date   WBC 5.6 04/15/2022   HGB 11.4 (L) 04/15/2022   HCT 35.1 (L) 04/15/2022   MCV 98.3 04/15/2022   PLT 212 04/15/2022     Chemistry      Component Value Date/Time   NA 140 04/15/2022 0412  K 3.8 04/15/2022 0412   CL 105 04/15/2022 0412   CO2 25 04/15/2022 0412   BUN 5 (L) 04/15/2022 0412   CREATININE 0.63 04/15/2022 0412   CREATININE 0.91 01/17/2019 1301   CREATININE 0.66 08/28/2012 1400      Component Value Date/Time   CALCIUM 8.6 (L) 04/15/2022 0412   ALKPHOS 62 04/13/2022 1016   AST 25 04/13/2022 1016   AST 22 01/17/2019 1301   ALT 19  04/13/2022 1016   ALT 17 01/17/2019 1301   BILITOT 1.1 04/13/2022 1016   BILITOT 0.9 01/17/2019 1301       RADIOGRAPHIC STUDIES: I have personally reviewed the radiological images as listed and agreed with the findings in the report. NM PET Image Initial (PI) Skull Base To Thigh  Result Date: 05/02/2022 CLINICAL DATA:  Initial treatment strategy for recurrent metastatic breast cancer to the small bowel. EXAM: NUCLEAR MEDICINE PET SKULL BASE TO THIGH TECHNIQUE: 7.4 mCi F-18 FDG was injected intravenously. Full-ring PET imaging was performed from the skull base to thigh after the radiotracer. CT data was obtained and used for attenuation correction and anatomic localization. Fasting blood glucose: 100 mg/dl COMPARISON:  CT chest, abdomen/pelvis from 04/13/2022 FINDINGS: Mediastinal blood pool activity: SUV max 2.72 Liver activity: SUV max NA NECK: No hypermetabolic lymph nodes in the neck. Incidental CT findings: None. CHEST: No tracer avid supraclavicular, axillary, mediastinal, or hilar lymph nodes. No tracer avid pulmonary nodules. Incidental CT findings: Aortic atherosclerosis. ABDOMEN/PELVIS: There is no abnormal tracer uptake within the liver, pancreas, spleen, or adrenal glands. No tracer avid abdominopelvic lymph nodes. Within the left upper quadrant of the abdomen there is a short segment of small bowel with mild wall thickening and increased radiotracer uptake within SUV max of 9.09, image 128/4. Within the ventral aspect of the lower pelvis there is a second loop of bowel which exhibits focal wall thickening with increased tracer uptake within SUV max of 12.36, image 179/4. Incidental CT findings: No significant ascites. No peritoneal nodularity identified. Gastrostomy tube is in place. Several low-attenuation foci are scattered within both lobes of liver without corresponding increased uptake. These are favored to represent multiple liver cysts. SKELETON: No focal hypermetabolic activity to  suggest skeletal metastasis. Incidental CT findings: Status post bilateral mastectomy with implant reconstruction. IMPRESSION: 1. There are 2 short segments of small bowel with mild wall thickening and increased radiotracer uptake within the left upper quadrant of the abdomen and lower pelvis. Findings may represent known small-bowel metastasis and/or additional sites of disease. 2. No tracer avid nodal metastasis or solid organ metastasis. 3.  Aortic Atherosclerosis (ICD10-I70.0). Electronically Signed   By: Kerby Moors M.D.   On: 05/02/2022 09:49   CT Abdomen Pelvis W Contrast  Result Date: 04/13/2022 CLINICAL DATA:  Abdominal pain, concern for bowel obstruction. Breast cancer, metastatic disease evaluation. * Tracking Code: BO * EXAM: CT CHEST, ABDOMEN, AND PELVIS WITH CONTRAST TECHNIQUE: Multidetector CT imaging of the chest, abdomen and pelvis was performed following the standard protocol during bolus administration of intravenous contrast. RADIATION DOSE REDUCTION: This exam was performed according to the departmental dose-optimization program which includes automated exposure control, adjustment of the mA and/or kV according to patient size and/or use of iterative reconstruction technique. CONTRAST:  187m OMNIPAQUE IOHEXOL 300 MG/ML  SOLN COMPARISON:  CT abdomen pelvis March 31, 2022. FINDINGS: CT CHEST FINDINGS Cardiovascular: Normal caliber thoracic aorta. Aortic atherosclerosis. Normal caliber central pulmonary arteries. Normal size heart. No significant pericardial effusion/thickening. Mediastinum/Nodes: No suspicious thyroid nodule.  No pathologically enlarged mediastinal, hilar or axillary lymph nodes. Small amount of crescentic feathery soft tissue stranding in the anterior mediastinum for instance on image 25/2. Prior right axillary lymph node dissection. Lungs/Pleura: No aggressive lytic or blastic lesion of bone. Biapical pleuroparenchymal scarring. Left lower lobe pneumatocele. No pleural  effusion. No pneumothorax. Musculoskeletal: Prior bilateral mastectomies with breast reconstruction and right axillary lymph node dissection. Sclerotic foci in the left humeral head on image 6/2 as well as on image 9/2. Sclerotic focus in the inferior endplate of T6, with associated endplate degenerative change. CT ABDOMEN PELVIS FINDINGS Hepatobiliary: Hypodense hepatic lesions measure fluid density compatible with cysts. Additional hypodense hepatic lesions are technically too small to accurately characterize but stable from prior examination and also favored to reflect cysts. No solid enhancing hepatic lesion identified. Gallbladder is unremarkable. No biliary ductal dilation. Pancreas: No pancreatic ductal dilation or evidence of acute inflammation. Spleen: No splenomegaly. Adrenals/Urinary Tract: Bilateral adrenal glands appear normal. No hydronephrosis. Kidneys demonstrate symmetric enhancement. Urinary bladder is unremarkable for degree of distension Stomach/Bowel: Worsening dilation of small bowel loops measuring up to 4.3 cm in diameter on image 81/2 with abrupt transition to decompressed bowel on image 87/2 near the site of the mucosal hyperenhancement seen on CT enterography December 09, 2021. Distal to this there is another prominent fluid-filled loop of bowel in the pelvis measuring 2.2 cm also with abrupt transition to decompressed bowel in area of bowel wall thickening with adjacent pelvic free fluid. Additionally there are multifocal short and moderate length segments of small bowel wall thickening with adjacent mesenteric edema/stranding for instance in the left hemiabdomen on image 75/2. Normal appendix. Colon is predominately decompressed limiting evaluation. Vascular/Lymphatic: Aortic atherosclerosis. Normal caliber abdominal aorta. Smooth IVC contours. No pathologically enlarged abdominal or pelvic lymph nodes. Prominent ileocolic lymph nodes measure up to 6 mm in short axis on image 82/2  Reproductive: Uterus and bilateral adnexa are unremarkable. Other: Small volume mesenteric and pelvic free fluid. Musculoskeletal: Sclerotic focus in the right femoral head on image 67/5. Multilevel Schmorl's node formation with associated degenerative endplate changes. Lumbar spondylosis. Grade 1 L4 on L5 degenerative anterolisthesis. IMPRESSION: 1. Worsening long segment of dilated fluid-filled small bowel with transition point in the right hemiabdomen similar in location to the area of mucosal hyperenhancement seen on prior imaging, compatible with worsening small bowel obstruction possibly related to an inflammatory or malignant stricture or adhesion. 2. Additionally distal to this there is a prominent fluid-filled loop of small bowel with abrupt transition to decompressed bowel with apparent wall thickening of this bowel similar to the more proximal section. Also there are multifocal short and moderate length segments of small bowel wall thickening with adjacent mesenteric edema/stranding, complex findings which may reflect multifocal disease from the above process, NSAID enteritis or superimposed infectious/inflammatory enteritis. 3. Prominent ileocolic lymph nodes measuring up to 6 mm in short axis, nonspecific but favored reactive. 4. Sclerotic foci in the left humeral head, T6 vertebral body and right femoral head are nonspecific but favored to reflect bone islands. Consider attention on nuclear medicine bone scan. 5. Small amount of crescentic feathery soft tissue stranding in the anterior mediastinum, nonspecific but favored to reflect thymic hyperplasia. 6. Prior bilateral mastectomies with breast reconstruction and right axillary lymph node dissection. 7. Hypodense hepatic lesions measure fluid density compatible with cysts. Additional hypodense hepatic lesions are technically too small to accurately characterize but also favored to reflect cysts. Attention on follow-up imaging suggested. 8.  Aortic  Atherosclerosis (ICD10-I70.0). Electronically  Signed   By: Dahlia Bailiff M.D.   On: 04/13/2022 16:08   CT Chest W Contrast  Result Date: 04/13/2022 CLINICAL DATA:  Abdominal pain, concern for bowel obstruction. Breast cancer, metastatic disease evaluation. * Tracking Code: BO * EXAM: CT CHEST, ABDOMEN, AND PELVIS WITH CONTRAST TECHNIQUE: Multidetector CT imaging of the chest, abdomen and pelvis was performed following the standard protocol during bolus administration of intravenous contrast. RADIATION DOSE REDUCTION: This exam was performed according to the departmental dose-optimization program which includes automated exposure control, adjustment of the mA and/or kV according to patient size and/or use of iterative reconstruction technique. CONTRAST:  119m OMNIPAQUE IOHEXOL 300 MG/ML  SOLN COMPARISON:  CT abdomen pelvis March 31, 2022. FINDINGS: CT CHEST FINDINGS Cardiovascular: Normal caliber thoracic aorta. Aortic atherosclerosis. Normal caliber central pulmonary arteries. Normal size heart. No significant pericardial effusion/thickening. Mediastinum/Nodes: No suspicious thyroid nodule. No pathologically enlarged mediastinal, hilar or axillary lymph nodes. Small amount of crescentic feathery soft tissue stranding in the anterior mediastinum for instance on image 25/2. Prior right axillary lymph node dissection. Lungs/Pleura: No aggressive lytic or blastic lesion of bone. Biapical pleuroparenchymal scarring. Left lower lobe pneumatocele. No pleural effusion. No pneumothorax. Musculoskeletal: Prior bilateral mastectomies with breast reconstruction and right axillary lymph node dissection. Sclerotic foci in the left humeral head on image 6/2 as well as on image 9/2. Sclerotic focus in the inferior endplate of T6, with associated endplate degenerative change. CT ABDOMEN PELVIS FINDINGS Hepatobiliary: Hypodense hepatic lesions measure fluid density compatible with cysts. Additional hypodense hepatic lesions  are technically too small to accurately characterize but stable from prior examination and also favored to reflect cysts. No solid enhancing hepatic lesion identified. Gallbladder is unremarkable. No biliary ductal dilation. Pancreas: No pancreatic ductal dilation or evidence of acute inflammation. Spleen: No splenomegaly. Adrenals/Urinary Tract: Bilateral adrenal glands appear normal. No hydronephrosis. Kidneys demonstrate symmetric enhancement. Urinary bladder is unremarkable for degree of distension Stomach/Bowel: Worsening dilation of small bowel loops measuring up to 4.3 cm in diameter on image 81/2 with abrupt transition to decompressed bowel on image 87/2 near the site of the mucosal hyperenhancement seen on CT enterography December 09, 2021. Distal to this there is another prominent fluid-filled loop of bowel in the pelvis measuring 2.2 cm also with abrupt transition to decompressed bowel in area of bowel wall thickening with adjacent pelvic free fluid. Additionally there are multifocal short and moderate length segments of small bowel wall thickening with adjacent mesenteric edema/stranding for instance in the left hemiabdomen on image 75/2. Normal appendix. Colon is predominately decompressed limiting evaluation. Vascular/Lymphatic: Aortic atherosclerosis. Normal caliber abdominal aorta. Smooth IVC contours. No pathologically enlarged abdominal or pelvic lymph nodes. Prominent ileocolic lymph nodes measure up to 6 mm in short axis on image 82/2 Reproductive: Uterus and bilateral adnexa are unremarkable. Other: Small volume mesenteric and pelvic free fluid. Musculoskeletal: Sclerotic focus in the right femoral head on image 67/5. Multilevel Schmorl's node formation with associated degenerative endplate changes. Lumbar spondylosis. Grade 1 L4 on L5 degenerative anterolisthesis. IMPRESSION: 1. Worsening long segment of dilated fluid-filled small bowel with transition point in the right hemiabdomen similar in  location to the area of mucosal hyperenhancement seen on prior imaging, compatible with worsening small bowel obstruction possibly related to an inflammatory or malignant stricture or adhesion. 2. Additionally distal to this there is a prominent fluid-filled loop of small bowel with abrupt transition to decompressed bowel with apparent wall thickening of this bowel similar to the more proximal section. Also there are  multifocal short and moderate length segments of small bowel wall thickening with adjacent mesenteric edema/stranding, complex findings which may reflect multifocal disease from the above process, NSAID enteritis or superimposed infectious/inflammatory enteritis. 3. Prominent ileocolic lymph nodes measuring up to 6 mm in short axis, nonspecific but favored reactive. 4. Sclerotic foci in the left humeral head, T6 vertebral body and right femoral head are nonspecific but favored to reflect bone islands. Consider attention on nuclear medicine bone scan. 5. Small amount of crescentic feathery soft tissue stranding in the anterior mediastinum, nonspecific but favored to reflect thymic hyperplasia. 6. Prior bilateral mastectomies with breast reconstruction and right axillary lymph node dissection. 7. Hypodense hepatic lesions measure fluid density compatible with cysts. Additional hypodense hepatic lesions are technically too small to accurately characterize but also favored to reflect cysts. Attention on follow-up imaging suggested. 8.  Aortic Atherosclerosis (ICD10-I70.0). Electronically Signed   By: Dahlia Bailiff M.D.   On: 04/13/2022 16:08   CT Head Wo Contrast  Result Date: 04/13/2022 CLINICAL DATA:  Metastatic disease evaluation. EXAM: CT HEAD WITHOUT CONTRAST TECHNIQUE: Contiguous axial images were obtained from the base of the skull through the vertex without intravenous contrast. RADIATION DOSE REDUCTION: This exam was performed according to the departmental dose-optimization program which  includes automated exposure control, adjustment of the mA and/or kV according to patient size and/or use of iterative reconstruction technique. COMPARISON:  None Available. FINDINGS: Brain: No acute hemorrhage, mass effect or midline shift. Gray-white differentiation is preserved. No hydrocephalus. No extra-axial collection. Basilar cisterns are patent. Vascular: No hyperdense vessel or unexpected calcification. Skull: No calvarial fracture or suspicious bone lesion. Skull base is unremarkable. Sinuses/Orbits: Unremarkable. Other: None. IMPRESSION: No acute intracranial abnormality. Electronically Signed   By: Emmit Alexanders M.D.   On: 04/13/2022 15:25    All questions were answered. The patient knows to call the clinic with any problems, questions or concerns. nd more than 50% was on counseling.     Benay Pike, MD 05/03/2022 3:03 PM

## 2022-05-04 ENCOUNTER — Telehealth: Payer: Self-pay | Admitting: Hematology and Oncology

## 2022-05-04 NOTE — Telephone Encounter (Signed)
Left patient a vm regarding upcoming appointment

## 2022-05-05 ENCOUNTER — Other Ambulatory Visit: Payer: Self-pay

## 2022-05-05 ENCOUNTER — Telehealth: Payer: Self-pay | Admitting: Hematology and Oncology

## 2022-05-05 ENCOUNTER — Inpatient Hospital Stay (HOSPITAL_BASED_OUTPATIENT_CLINIC_OR_DEPARTMENT_OTHER): Payer: PPO | Admitting: Hematology and Oncology

## 2022-05-05 DIAGNOSIS — C799 Secondary malignant neoplasm of unspecified site: Secondary | ICD-10-CM | POA: Diagnosis not present

## 2022-05-05 DIAGNOSIS — D0511 Intraductal carcinoma in situ of right breast: Secondary | ICD-10-CM | POA: Diagnosis not present

## 2022-05-05 LAB — CA 125: Cancer Antigen (CA) 125: 20.7 U/mL (ref 0.0–38.1)

## 2022-05-05 LAB — CANCER ANTIGEN 19-9: CA 19-9: 9 U/mL (ref 0–35)

## 2022-05-05 LAB — CANCER ANTIGEN 15-3: CA 15-3: 11 U/mL (ref 0.0–25.0)

## 2022-05-05 NOTE — Progress Notes (Signed)
The proposed treatment discussed in conference is for discussion purpose only and is not a binding recommendation.  The patients have not been physically examined, or presented with their treatment options.  Therefore, final treatment plans cannot be decided.  

## 2022-05-05 NOTE — Progress Notes (Signed)
I have faxed a request for the following pathology slides from [facility] to be sent to [facility] pathology for Dr Vic Ripper to compare to breast biopsy slides from 2021. [facility] slides: Case [id number]:[id number], collected on 04/08/2022.  Suanne Marker and [place] in [place] pathology dept have been notified.

## 2022-05-05 NOTE — Telephone Encounter (Signed)
Patient aware of upcoming appointments  

## 2022-05-05 NOTE — Progress Notes (Signed)
[place] FOLLOW UP NOTE  Patient Care Team: Chesley Noon, MD as PCP - General (Family Medicine) Mauro Kaufmann, RN as Oncology Nurse Navigator Rockwell Germany, RN as Oncology Nurse Navigator [name], MD as Consulting Physician (General Surgery) Eppie Gibson, MD as Attending Physician (Radiation Oncology) Juanita Craver, MD as Consulting Physician (Gastroenterology) Allyn Kenner, MD as Consulting Physician (Dermatology) Benay Pike, MD as Consulting Physician (Hematology and Oncology)  CHIEF COMPLAINTS/PURPOSE OF CONSULTATION:  Small bowel poorly diff carcinoma.  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  It is highly unusual to see a microinvasive breast cancer and predominantly DCIS with negative margins negative lymph nodes to present with metastatic disease.  Also the location of her disease seems to be highly unusual for breast cancer metastasis. Given poorly differentiated carcinoma, I spoke to [facility] pathology team who suggested that the carcinoma has not arisen in small bowel, however it was very poorly differentiated and given her history of breast cancer they wondered if this could be related to breast cancer.  The pathologist is unable to assist with primary and suggested that we correlate clinically.  Outpatient PET/CT showed 2 short segments of small bowel with mild wall thickening and increased radiotracer uptake within the left upper quadrant of abdominal and lower pelvis.  Findings may represent known small bowel  metastasis or additional sites of disease  Tumor markers neg. I discussed with the surgeon who is agreeable to resect the affected areas of small bowel. We will do further review once this specimen is available.  Cancer ID typing will be sent as well We will also try to bring pathology slides from [facility] for further review. At this time plan remains surgical resection followed by adj therapy with regimen that may have effectiveness against multiple cancers She is ok with this plan.   Thank you for consulting [place] in the care of this patient.  Please do not hesitate to contact us with any additional questions or concerns.  HISTORY OF PRESENTING ILLNESS:  [name] 67 y.o. 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  This is a very pleasant 67 year old postmenopausal female patient who was previously seen by Dr. 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  There was no evidence of malignancy in the left breast.  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  4 right axillary lymph nodes were removed all benign.  She then had breast reconstruction.  Since she did not need antiestrogen therapy, she was not followed in the breast oncology clinic. She first noticed some blood in her stool back in November 2022, followed up with Dr. Collene Mares from gastroenterology and had a colonoscopy which was unremarkable however Dr. Collene Mares recommended further evaluation with imaging.  She then had some CT abdomen which showed  no definitive abnormality.  She also started having some abdominal pains which she describes as a spasm happens intermittently started  around 2 to 3 months ago but have been more frequent in onset. She was referred to [facility] for ongoing abdominal pain.     She had enteroscopy which showed discrete focal segment of abnormal jejunum these findings are concerning for lymphoma or for other infiltrative malignancy.  She then had small bowel mass biopsy.  The enteroscopy showed normal esophagus, stomach and examined duodenum.  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  Histology showed sheets to partially nested neoplastic cells with expression of CAM5.2 and GATA3.  Since she had a history of breast cancer it was thought that this could represent metastatic breast cancer.   She was also taken to the OR on her most recent visit given worsening abdominal pain.  According to the surgeon there was 2 small bowel masses 1 about 20 cm from the ligament of Treitz this was biopsied recently.  There is another 1 approximately 100 cm from the terminal ileum bowel turned back on itself in this area and a band of omentum stuck to the bowel which was divided.  Not sure if the band or the mass was causing obstruction but the bowel proximal to this area was dilated and the bowel distal appeared normal.  I had a conversation with the surgeon and he wondered if the band was problem causing the obstruction.   The biopsied omentum showed benign omentum, negative for malignancy. Cytology is negative.    INTERIM HISTORY  She is here after telephone visit.  She continues to do well.  She is following up with the surgeon tomorrow to discuss about the role of resection since this is oligometastatic disease.  She also wondered about the tumor markers which resulted normal.  She denies any other concerning symptoms today.  REVIEW OF SYSTEMS:   Constitutional: Denies fevers, chills or abnormal night sweats Eyes: Denies blurriness of vision, double vision or watery eyes Ears, nose, mouth, throat, and face: Denies mucositis or sore  throat Respiratory: Denies cough, dyspnea or wheezes Cardiovascular: Denies palpitation, chest discomfort or lower extremity swelling Gastrointestinal:  Denies nausea, heartburn or change in bowel habits Skin: Denies abnormal skin rashes Lymphatics: Denies new lymphadenopathy or easy bruising Neurological:Denies numbness, tingling or new weaknesses Behavioral/Psych: Mood is stable, no new changes  All other systems were reviewed with the patient and are negative.   MEDICAL HISTORY:  Past Medical History:  Diagnosis Date   Arthritis    Backache, unspecified    Cancer ([place])    breast s/p BL mastectomies   Colon polyps    Family history of breast cancer 10/27/2021   Family history of colon cancer 10/27/2021   Hyperlipidemia    PONV (postoperative nausea and vomiting)     SURGICAL HISTORY: Past Surgical History:  Procedure Laterality Date   BREAST RECONSTRUCTION WITH PLACEMENT OF TISSUE EXPANDER AND FLEX HD (ACELLULAR HYDRATED DERMIS) Bilateral 04/05/2019   Procedure: BILATERAL BREAST RECONSTRUCTION WITH PLACEMENT OF TISSUE EXPANDER AND FLEX HD (ACELLULAR HYDRATED DERMIS);  Surgeon: Wallace Going, DO;  Location: [place];  Service: Plastics;  Laterality: Bilateral;   ENDOMETRIAL ABLATION  1998   GIVENS CAPSULE STUDY N/A 12/04/2021   Procedure: GIVENS CAPSULE STUDY;  Surgeon: Juanita Craver, MD;  Location: [place];  Service: Gastroenterology;  Laterality: N/A;   KNEE SURGERY Right 2007   LAPAROSCOPY N/A 04/14/2022   Procedure: LAPAROSCOPY DIAGNOSTIC, G-TUBE PLACEMENT; OMENTUM  BIOPSY; LYSIS OF ADHESIONS;  Surgeon: Felicie Morn, MD;  Location: [facility];  Service: General;  Laterality: N/A;   LIPOSUCTION WITH LIPOFILLING Bilateral 01/30/2020   Procedure: LIPOSUCTION WITH LIPOFILLING;  Surgeon: Wallace Going, DO;  Location: [place];  Service: Plastics;  Laterality: Bilateral;  90 min, please   MASTECTOMY W/ SENTINEL NODE BIOPSY Bilateral  04/05/2019   Procedure: BILATERAL MASTECTOMIES WITH RIGHT SENTINEL LYMPH NODE BIOPSY;  Surgeon: [name], MD;  Location: [place];  Service: General;  Laterality: Bilateral;   REMOVAL OF BILATERAL TISSUE EXPANDERS WITH PLACEMENT OF BILATERAL BREAST IMPLANTS Bilateral 09/26/2019   Procedure: REMOVAL OF BILATERAL TISSUE EXPANDERS WITH PLACEMENT OF BILATERAL BREAST IMPLANTS;  Surgeon: Wallace Going, DO;  Location: [place];  Service: Plastics;  Laterality: Bilateral;   RHINOPLASTY  1977   TONSILLECTOMY  1961    SOCIAL HISTORY: Social History   Socioeconomic History   Marital status: Married    Spouse name: Psychologist, prison and probation services   Number of children: 3   Years of education: 16+   Highest education level: Not on file  Occupational History    Employer: [employer]   Occupation: Medical illustrator  Tobacco Use   Smoking status: Never   Smokeless tobacco: Never  Vaping Use   Vaping Use: Never used  Substance and Sexual Activity   Alcohol use: Yes    Comment: 2-3/week   Drug use: No   Sexual activity: Yes    Partners: Male  Other Topics Concern   Not on file  Social History Narrative   Marital Status:  Married Psychologist, counselling)    Children:  G3 P3 Daughters (Lauren, Sherran Needs, [place])    Pets:  Poodle (Mollie); Cats Eduard Clos, Shirlee [place])    Living Situation: Lives with spouse.  Her daughters live in [place].    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[employer])    Education:  Facilities manager)    Tobacco Use/Exposure:  None    Alcohol Use:  Occasional   Drug Use:  None   Diet:  Regular   Exercise:  Walking   Hobbies:  Gardening, Actor              Social Determinants of Health   Financial Resource Strain: Not on file  Food Insecurity: No Food Insecurity (04/13/2022)   Hunger Vital Sign    Worried About Running Out of Food in the Last Year: Never true    [place] in the Last  Year: Never true  Transportation Needs: No Transportation Needs (04/13/2022)   PRAPARE - Hydrologist (Medical): No    Lack of Transportation (Non-Medical): No  Physical Activity: Not on file  Stress: Not on file  Social Connections: Not on file  Intimate Partner Violence: Not At Risk (04/13/2022)   Humiliation, Afraid, Rape, and Kick questionnaire    Fear of Current or Ex-Partner: No    Emotionally Abused: No    Physically Abused: No    Sexually Abused: No    FAMILY HISTORY: Family History  Problem Relation Age of Onset   Arthritis Mother    Colon cancer Mother 45   Melanoma Father        dx 16s; nose   Lung cancer Paternal Uncle        dx after 32; smoking hx   Breast cancer Maternal Grandmother  dx 76s   Hyperlipidemia Paternal Grandmother    Cancer Paternal Grandfather        unknown type; d. before 28   Colon cancer Other        MGM's mother    ALLERGIES:  has no allergies on file.  MEDICATIONS:  Current Outpatient Medications  Medication Sig Dispense Refill   acetaminophen (TYLENOL) 500 MG tablet Take 2 tablets (1,000 mg total) by mouth every 6 (six) hours as needed for moderate pain. 30 tablet 0   calcium carbonate (OS-CAL - DOSED IN MG OF ELEMENTAL CALCIUM) 1250 (500 Ca) MG tablet Take 1 tablet by mouth 2 (two) times daily with a meal.     Calcium Carbonate Antacid (TUMS PO) Take 3-4 tablets by mouth as needed (heartburn).     Cholecalciferol (VITAMIN D3) 125 MCG (5000 UT) CAPS Take 5,000 Units by mouth every Monday.     cycloSPORINE (RESTASIS) 0.05 % ophthalmic emulsion Place 1 drop into both eyes 2 (two) times daily.     Ensure Max Protein (ENSURE MAX PROTEIN) LIQD Take 330 mLs (11 oz total) by mouth 3 (three) times daily.     famotidine (PEPCID) 40 MG tablet Take 40 mg by mouth daily as needed for heartburn or indigestion.     Melatonin 10 MG TABS Take 10 mg by mouth at bedtime.     Multiple Vitamin (MULTI-VITAMIN DAILY PO)  Take 1 tablet by mouth daily.     omega-3 acid ethyl esters (LOVAZA) 1 g capsule Take 1 g by mouth 2 (two) times daily.     ondansetron (ZOFRAN-ODT) 4 MG disintegrating tablet Take 1 tablet (4 mg total) by mouth every 6 (six) hours as needed for nausea. 20 tablet 0   rosuvastatin (CRESTOR) 10 MG tablet Take 10 mg by mouth at bedtime.     No current facility-administered medications for this visit.   PHYSICAL EXAMINATION: ECOG PERFORMANCE STATUS: 0 - Asymptomatic  There were no vitals filed for this visit.  There were no vitals filed for this visit.   GENERAL:alert, no distress and comfortable Rest of PE Deferred in lieu of counseling  LABORATORY DATA:  I have reviewed the data as listed Lab Results  Component Value Date   WBC 5.6 04/15/2022   HGB 11.4 (L) 04/15/2022   HCT 35.1 (L) 04/15/2022   MCV 98.3 04/15/2022   PLT 212 04/15/2022     Chemistry      Component Value Date/Time   NA 140 04/15/2022 0412   K 3.8 04/15/2022 0412   CL 105 04/15/2022 0412   CO2 25 04/15/2022 0412   BUN 5 (L) 04/15/2022 0412   CREATININE 0.63 04/15/2022 0412   CREATININE 0.91 01/17/2019 1301   CREATININE 0.66 08/28/2012 1400      Component Value Date/Time   CALCIUM 8.6 (L) 04/15/2022 0412   ALKPHOS 62 04/13/2022 1016   AST 25 04/13/2022 1016   AST 22 01/17/2019 1301   ALT 19 04/13/2022 1016   ALT 17 01/17/2019 1301   BILITOT 1.1 04/13/2022 1016   BILITOT 0.9 01/17/2019 1301       RADIOGRAPHIC STUDIES: I have personally reviewed the radiological images as listed and agreed with the findings in the report. NM PET Image Initial (PI) Skull Base To Thigh  Result Date: 05/02/2022 CLINICAL DATA:  Initial treatment strategy for recurrent metastatic breast cancer to the small bowel. EXAM: NUCLEAR MEDICINE PET SKULL BASE TO THIGH TECHNIQUE: 7.4 mCi F-18 FDG was injected intravenously. Full-ring PET imaging  was performed from the skull base to thigh after the radiotracer. CT data was obtained  and used for attenuation correction and anatomic localization. Fasting blood glucose: 100 mg/dl COMPARISON:  CT chest, abdomen/pelvis from 04/13/2022 FINDINGS: Mediastinal blood pool activity: SUV max 2.72 Liver activity: SUV max NA NECK: No hypermetabolic lymph nodes in the neck. Incidental CT findings: None. CHEST: No tracer avid supraclavicular, axillary, mediastinal, or hilar lymph nodes. No tracer avid pulmonary nodules. Incidental CT findings: Aortic atherosclerosis. ABDOMEN/PELVIS: There is no abnormal tracer uptake within the liver, pancreas, spleen, or adrenal glands. No tracer avid abdominopelvic lymph nodes. Within the left upper quadrant of the abdomen there is a short segment of small bowel with mild wall thickening and increased radiotracer uptake within SUV max of 9.09, image 128/4. Within the ventral aspect of the lower pelvis there is a second loop of bowel which exhibits focal wall thickening with increased tracer uptake within SUV max of 12.36, image 179/4. Incidental CT findings: No significant ascites. No peritoneal nodularity identified. Gastrostomy tube is in place. Several low-attenuation foci are scattered within both lobes of liver without corresponding increased uptake. These are favored to represent multiple liver cysts. SKELETON: No focal hypermetabolic activity to suggest skeletal metastasis. Incidental CT findings: Status post bilateral mastectomy with implant reconstruction. IMPRESSION: 1. There are 2 short segments of small bowel with mild wall thickening and increased radiotracer uptake within the left upper quadrant of the abdomen and lower pelvis. Findings may represent known small-bowel metastasis and/or additional sites of disease. 2. No tracer avid nodal metastasis or solid organ metastasis. 3.  Aortic Atherosclerosis (ICD10-I70.0). Electronically Signed   By: Kerby Moors M.D.   On: 05/02/2022 09:49   CT Abdomen Pelvis W Contrast  Result Date: 04/13/2022 CLINICAL DATA:   Abdominal pain, concern for bowel obstruction. Breast cancer, metastatic disease evaluation. * Tracking Code: BO * EXAM: CT CHEST, ABDOMEN, AND PELVIS WITH CONTRAST TECHNIQUE: Multidetector CT imaging of the chest, abdomen and pelvis was performed following the standard protocol during bolus administration of intravenous contrast. RADIATION DOSE REDUCTION: This exam was performed according to the departmental dose-optimization program which includes automated exposure control, adjustment of the mA and/or kV according to patient size and/or use of iterative reconstruction technique. CONTRAST:  116m OMNIPAQUE IOHEXOL 300 MG/ML  SOLN COMPARISON:  CT abdomen pelvis March 31, 2022. FINDINGS: CT CHEST FINDINGS Cardiovascular: Normal caliber thoracic aorta. Aortic atherosclerosis. Normal caliber central pulmonary arteries. Normal size heart. No significant pericardial effusion/thickening. Mediastinum/Nodes: No suspicious thyroid nodule. No pathologically enlarged mediastinal, hilar or axillary lymph nodes. Small amount of crescentic feathery soft tissue stranding in the anterior mediastinum for instance on image 25/2. Prior right axillary lymph node dissection. Lungs/Pleura: No aggressive lytic or blastic lesion of bone. Biapical pleuroparenchymal scarring. Left lower lobe pneumatocele. No pleural effusion. No pneumothorax. Musculoskeletal: Prior bilateral mastectomies with breast reconstruction and right axillary lymph node dissection. Sclerotic foci in the left humeral head on image 6/2 as well as on image 9/2. Sclerotic focus in the inferior endplate of T6, with associated endplate degenerative change. CT ABDOMEN PELVIS FINDINGS Hepatobiliary: Hypodense hepatic lesions measure fluid density compatible with cysts. Additional hypodense hepatic lesions are technically too small to accurately characterize but stable from prior examination and also favored to reflect cysts. No solid enhancing hepatic lesion identified.  Gallbladder is unremarkable. No biliary ductal dilation. Pancreas: No pancreatic ductal dilation or evidence of acute inflammation. Spleen: No splenomegaly. Adrenals/Urinary Tract: Bilateral adrenal glands appear normal. No hydronephrosis. Kidneys demonstrate symmetric  enhancement. Urinary bladder is unremarkable for degree of distension Stomach/Bowel: Worsening dilation of small bowel loops measuring up to 4.3 cm in diameter on image 81/2 with abrupt transition to decompressed bowel on image 87/2 near the site of the mucosal hyperenhancement seen on CT enterography December 09, 2021. Distal to this there is another prominent fluid-filled loop of bowel in the pelvis measuring 2.2 cm also with abrupt transition to decompressed bowel in area of bowel wall thickening with adjacent pelvic free fluid. Additionally there are multifocal short and moderate length segments of small bowel wall thickening with adjacent mesenteric edema/stranding for instance in the left hemiabdomen on image 75/2. Normal appendix. Colon is predominately decompressed limiting evaluation. Vascular/Lymphatic: Aortic atherosclerosis. Normal caliber abdominal aorta. Smooth IVC contours. No pathologically enlarged abdominal or pelvic lymph nodes. Prominent ileocolic lymph nodes measure up to 6 mm in short axis on image 82/2 Reproductive: Uterus and bilateral adnexa are unremarkable. Other: Small volume mesenteric and pelvic free fluid. Musculoskeletal: Sclerotic focus in the right femoral head on image 67/5. Multilevel Schmorl's node formation with associated degenerative endplate changes. Lumbar spondylosis. Grade 1 L4 on L5 degenerative anterolisthesis. IMPRESSION: 1. Worsening long segment of dilated fluid-filled small bowel with transition point in the right hemiabdomen similar in location to the area of mucosal hyperenhancement seen on prior imaging, compatible with worsening small bowel obstruction possibly related to an inflammatory or  malignant stricture or adhesion. 2. Additionally distal to this there is a prominent fluid-filled loop of small bowel with abrupt transition to decompressed bowel with apparent wall thickening of this bowel similar to the more proximal section. Also there are multifocal short and moderate length segments of small bowel wall thickening with adjacent mesenteric edema/stranding, complex findings which may reflect multifocal disease from the above process, NSAID enteritis or superimposed infectious/inflammatory enteritis. 3. Prominent ileocolic lymph nodes measuring up to 6 mm in short axis, nonspecific but favored reactive. 4. Sclerotic foci in the left humeral head, T6 vertebral body and right femoral head are nonspecific but favored to reflect bone islands. Consider attention on nuclear medicine bone scan. 5. Small amount of crescentic feathery soft tissue stranding in the anterior mediastinum, nonspecific but favored to reflect thymic hyperplasia. 6. Prior bilateral mastectomies with breast reconstruction and right axillary lymph node dissection. 7. Hypodense hepatic lesions measure fluid density compatible with cysts. Additional hypodense hepatic lesions are technically too small to accurately characterize but also favored to reflect cysts. Attention on follow-up imaging suggested. 8.  Aortic Atherosclerosis (ICD10-I70.0). Electronically Signed   By: Dahlia Bailiff M.D.   On: 04/13/2022 16:08   CT Chest W Contrast  Result Date: 04/13/2022 CLINICAL DATA:  Abdominal pain, concern for bowel obstruction. Breast cancer, metastatic disease evaluation. * Tracking Code: BO * EXAM: CT CHEST, ABDOMEN, AND PELVIS WITH CONTRAST TECHNIQUE: Multidetector CT imaging of the chest, abdomen and pelvis was performed following the standard protocol during bolus administration of intravenous contrast. RADIATION DOSE REDUCTION: This exam was performed according to the departmental dose-optimization program which includes automated  exposure control, adjustment of the mA and/or kV according to patient size and/or use of iterative reconstruction technique. CONTRAST:  121m OMNIPAQUE IOHEXOL 300 MG/ML  SOLN COMPARISON:  CT abdomen pelvis March 31, 2022. FINDINGS: CT CHEST FINDINGS Cardiovascular: Normal caliber thoracic aorta. Aortic atherosclerosis. Normal caliber central pulmonary arteries. Normal size heart. No significant pericardial effusion/thickening. Mediastinum/Nodes: No suspicious thyroid nodule. No pathologically enlarged mediastinal, hilar or axillary lymph nodes. Small amount of crescentic feathery soft tissue stranding in the  anterior mediastinum for instance on image 25/2. Prior right axillary lymph node dissection. Lungs/Pleura: No aggressive lytic or blastic lesion of bone. Biapical pleuroparenchymal scarring. Left lower lobe pneumatocele. No pleural effusion. No pneumothorax. Musculoskeletal: Prior bilateral mastectomies with breast reconstruction and right axillary lymph node dissection. Sclerotic foci in the left humeral head on image 6/2 as well as on image 9/2. Sclerotic focus in the inferior endplate of T6, with associated endplate degenerative change. CT ABDOMEN PELVIS FINDINGS Hepatobiliary: Hypodense hepatic lesions measure fluid density compatible with cysts. Additional hypodense hepatic lesions are technically too small to accurately characterize but stable from prior examination and also favored to reflect cysts. No solid enhancing hepatic lesion identified. Gallbladder is unremarkable. No biliary ductal dilation. Pancreas: No pancreatic ductal dilation or evidence of acute inflammation. Spleen: No splenomegaly. Adrenals/Urinary Tract: Bilateral adrenal glands appear normal. No hydronephrosis. Kidneys demonstrate symmetric enhancement. Urinary bladder is unremarkable for degree of distension Stomach/Bowel: Worsening dilation of small bowel loops measuring up to 4.3 cm in diameter on image 81/2 with abrupt transition  to decompressed bowel on image 87/2 near the site of the mucosal hyperenhancement seen on CT enterography December 09, 2021. Distal to this there is another prominent fluid-filled loop of bowel in the pelvis measuring 2.2 cm also with abrupt transition to decompressed bowel in area of bowel wall thickening with adjacent pelvic free fluid. Additionally there are multifocal short and moderate length segments of small bowel wall thickening with adjacent mesenteric edema/stranding for instance in the left hemiabdomen on image 75/2. Normal appendix. Colon is predominately decompressed limiting evaluation. Vascular/Lymphatic: Aortic atherosclerosis. Normal caliber abdominal aorta. Smooth IVC contours. No pathologically enlarged abdominal or pelvic lymph nodes. Prominent ileocolic lymph nodes measure up to 6 mm in short axis on image 82/2 Reproductive: Uterus and bilateral adnexa are unremarkable. Other: Small volume mesenteric and pelvic free fluid. Musculoskeletal: Sclerotic focus in the right femoral head on image 67/5. Multilevel Schmorl's node formation with associated degenerative endplate changes. Lumbar spondylosis. Grade 1 L4 on L5 degenerative anterolisthesis. IMPRESSION: 1. Worsening long segment of dilated fluid-filled small bowel with transition point in the right hemiabdomen similar in location to the area of mucosal hyperenhancement seen on prior imaging, compatible with worsening small bowel obstruction possibly related to an inflammatory or malignant stricture or adhesion. 2. Additionally distal to this there is a prominent fluid-filled loop of small bowel with abrupt transition to decompressed bowel with apparent wall thickening of this bowel similar to the more proximal section. Also there are multifocal short and moderate length segments of small bowel wall thickening with adjacent mesenteric edema/stranding, complex findings which may reflect multifocal disease from the above process, NSAID enteritis or  superimposed infectious/inflammatory enteritis. 3. Prominent ileocolic lymph nodes measuring up to 6 mm in short axis, nonspecific but favored reactive. 4. Sclerotic foci in the left humeral head, T6 vertebral body and right femoral head are nonspecific but favored to reflect bone islands. Consider attention on nuclear medicine bone scan. 5. Small amount of crescentic feathery soft tissue stranding in the anterior mediastinum, nonspecific but favored to reflect thymic hyperplasia. 6. Prior bilateral mastectomies with breast reconstruction and right axillary lymph node dissection. 7. Hypodense hepatic lesions measure fluid density compatible with cysts. Additional hypodense hepatic lesions are technically too small to accurately characterize but also favored to reflect cysts. Attention on follow-up imaging suggested. 8.  Aortic Atherosclerosis (ICD10-I70.0). Electronically Signed   By: Dahlia Bailiff M.D.   On: 04/13/2022 16:08   CT Head Wo Contrast  Result Date: 04/13/2022 CLINICAL DATA:  Metastatic disease evaluation. EXAM: CT HEAD WITHOUT CONTRAST TECHNIQUE: Contiguous axial images were obtained from the base of the skull through the vertex without intravenous contrast. RADIATION DOSE REDUCTION: This exam was performed according to the departmental dose-optimization program which includes automated exposure control, adjustment of the mA and/or kV according to patient size and/or use of iterative reconstruction technique. COMPARISON:  None Available. FINDINGS: Brain: No acute hemorrhage, mass effect or midline shift. Gray-white differentiation is preserved. No hydrocephalus. No extra-axial collection. Basilar cisterns are patent. Vascular: No hyperdense vessel or unexpected calcification. Skull: No calvarial fracture or suspicious bone lesion. Skull base is unremarkable. Sinuses/Orbits: Unremarkable. Other: None. IMPRESSION: No acute intracranial abnormality. Electronically Signed   By: Emmit Alexanders M.D.    On: 04/13/2022 15:25    All questions were answered. The patient knows to call the clinic with any problems, questions or concerns.  I connected with  [name] on 05/05/22 by a telephone application and verified that I am speaking with the correct person using two identifiers.   I discussed the limitations of evaluation and management by telemedicine. The patient expressed understanding and agreed to proceed.  Total time spent: 12 minutes    Benay Pike, MD 05/05/2022 3:44 PM

## 2022-05-12 ENCOUNTER — Ambulatory Visit: Payer: Self-pay | Admitting: Surgery

## 2022-05-12 NOTE — Progress Notes (Signed)
Sent message, via epic in basket, requesting orders in epic from surgeon.  

## 2022-05-13 NOTE — Patient Instructions (Signed)
DUE TO COVID-19 ONLY TWO VISITORS  (aged 67 and older)  ARE ALLOWED TO COME WITH YOU AND STAY IN THE WAITING ROOM ONLY DURING PRE OP AND PROCEDURE.   **NO VISITORS ARE ALLOWED IN THE SHORT STAY AREA OR RECOVERY ROOM!!**  IF YOU WILL BE ADMITTED INTO THE HOSPITAL YOU ARE ALLOWED ONLY FOUR SUPPORT PEOPLE DURING VISITATION HOURS ONLY (7 AM -8PM)   The support person(s) must pass our screening, gel in and out, and wear a mask at all times, including in the patient's room. Patients must also wear a mask when staff or their support person are in the room. Visitors GUEST BADGE MUST BE WORN VISIBLY  One adult visitor may remain with you overnight and MUST be in the room by 8 P.M.     Your procedure is scheduled on: 05/17/22   Report to [facility] Main Entrance    Report to admitting at : 5:15 AM   Call this number if you have problems the morning of surgery [phone]   Do not eat food :After Midnight.   After Midnight you may have the following liquids until : 4:30 AM DAY OF SURGERY  Water Black Coffee (sugar ok, NO MILK/CREAM OR CREAMERS)  Tea (sugar ok, NO MILK/CREAM OR CREAMERS) regular and decaf                             Plain Jell-O (NO RED)                                           Fruit ices (not with fruit pulp, NO RED)                                     Popsicles (NO RED)                                                                  Juice: apple, WHITE grape, WHITE cranberry Sports drinks like Gatorade (NO RED)             FOLLOW BOWEL PREP AND ANY ADDITIONAL PRE OP INSTRUCTIONS YOU RECEIVED FROM YOUR SURGEON'S OFFICE!!!   Oral Hygiene is also important to reduce your risk of infection.                                    Remember - BRUSH YOUR TEETH THE MORNING OF SURGERY WITH YOUR REGULAR TOOTHPASTE  DENTURES WILL BE REMOVED PRIOR TO SURGERY PLEASE DO NOT APPLY "Poly grip" OR ADHESIVES!!!   Do NOT smoke after Midnight   Take these medicines the morning of  surgery with A SIP OF WATER: Famotidine.Tylenol as needed.                              You may not have any metal on your body including hair pins, jewelry, and body piercing  Do not wear make-up, lotions, powders, perfumes/cologne, or deodorant  Do not wear nail polish including gel and S&S, artificial/acrylic nails, or any other type of covering on natural nails including finger and toenails. If you have artificial nails, gel coating, etc. that needs to be removed by a nail salon please have this removed prior to surgery or surgery may need to be canceled/ delayed if the surgeon/ anesthesia feels like they are unable to be safely monitored.   Do not shave  48 hours prior to surgery.    Do not bring valuables to the hospital. [place].   Contacts, glasses, or bridgework may not be worn into surgery.   Bring small overnight bag day of surgery.   DO NOT [place]. PHARMACY WILL DISPENSE MEDICATIONS LISTED ON YOUR MEDICATION LIST TO YOU DURING YOUR ADMISSION [place]!    Patients discharged on the day of surgery will not be allowed to drive home.  Someone NEEDS to stay with you for the first 24 hours after anesthesia.   Special Instructions: Bring a copy of your healthcare power of attorney and living will documents         the day of surgery if you haven't scanned them before.              Please read over the following fact sheets you were given: IF YOU HAVE QUESTIONS ABOUT YOUR PRE-OP INSTRUCTIONS PLEASE CALL [phone]    [facility] - Preparing for Surgery Before surgery, you can play an important role.  Because skin is not sterile, your skin needs to be as free of germs as possible.  You can reduce the number of germs on your skin by washing with CHG (chlorahexidine gluconate) soap before surgery.  CHG is an antiseptic cleaner which kills germs and bonds with the skin to continue  killing germs even after washing. Please DO NOT use if you have an allergy to CHG or antibacterial soaps.  If your skin becomes reddened/irritated stop using the CHG and inform your nurse when you arrive at Short Stay. Do not shave (including legs and underarms) for at least 48 hours prior to the first CHG shower.  You may shave your face/neck. Please follow these instructions carefully:  1.  Shower with CHG Soap the night before surgery and the  morning of Surgery.  2.  If you choose to wash your hair, wash your hair first as usual with your  normal  shampoo.  3.  After you shampoo, rinse your hair and body thoroughly to remove the  shampoo.                           4.  Use CHG as you would any other liquid soap.  You can apply chg directly  to the skin and wash                       Gently with a scrungie or clean washcloth.  5.  Apply the CHG Soap to your body ONLY FROM THE NECK DOWN.   Do not use on face/ open                           Wound or open sores. Avoid contact with eyes,  ears mouth and genitals (private parts).                       Wash face,  Genitals (private parts) with your normal soap.             6.  Wash thoroughly, paying special attention to the area where your surgery  will be performed.  7.  Thoroughly rinse your body with warm water from the neck down.  8.  DO NOT shower/wash with your normal soap after using and rinsing off  the CHG Soap.                9.  Pat yourself dry with a clean towel.            10.  Wear clean pajamas.            11.  Place clean sheets on your bed the night of your first shower and do not  sleep with pets. Day of Surgery : Do not apply any lotions/deodorants the morning of surgery.  Please wear clean clothes to the hospital/surgery center.  FAILURE TO FOLLOW THESE INSTRUCTIONS MAY RESULT IN THE CANCELLATION OF YOUR SURGERY PATIENT SIGNATURE_________________________________  NURSE  SIGNATURE__________________________________  ________________________________________________________________________ WHAT IS A BLOOD TRANSFUSION? Blood Transfusion Information  A transfusion is the replacement of blood or some of its parts. Blood is made up of multiple cells which provide different functions. Red blood cells carry oxygen and are used for blood loss replacement. White blood cells fight against infection. Platelets control bleeding. Plasma helps clot blood. Other blood products are available for specialized needs, such as hemophilia or other clotting disorders. BEFORE THE TRANSFUSION  Who gives blood for transfusions?  Healthy volunteers who are fully evaluated to make sure their blood is safe. This is blood bank blood. Transfusion therapy is the safest it has ever been in the practice of medicine. Before blood is taken from a donor, a complete history is taken to make sure that person has no history of diseases nor engages in risky social behavior (examples are intravenous drug use or sexual activity with multiple partners). The donor's travel history is screened to minimize risk of transmitting infections, such as malaria. The donated blood is tested for signs of infectious diseases, such as HIV and hepatitis. The blood is then tested to be sure it is compatible with you in order to minimize the chance of a transfusion reaction. If you or a relative donates blood, this is often done in anticipation of surgery and is not appropriate for emergency situations. It takes many days to process the donated blood. RISKS AND COMPLICATIONS Although transfusion therapy is very safe and saves many lives, the main dangers of transfusion include:  Getting an infectious disease. Developing a transfusion reaction. This is an allergic reaction to something in the blood you were given. Every precaution is taken to prevent this. The decision to have a blood transfusion has been considered carefully  by your caregiver before blood is given. Blood is not given unless the benefits outweigh the risks. AFTER THE TRANSFUSION Right after receiving a blood transfusion, you will usually feel much better and more energetic. This is especially true if your red blood cells have gotten low (anemic). The transfusion raises the level of the red blood cells which carry oxygen, and this usually causes an energy increase. The nurse administering the transfusion will monitor you carefully for complications. HOME CARE INSTRUCTIONS  No special instructions are  needed after a transfusion. You may find your energy is better. Speak with your caregiver about any limitations on activity for underlying diseases you may have. SEEK MEDICAL CARE IF:  Your condition is not improving after your transfusion. You develop redness or irritation at the intravenous (IV) site. SEEK IMMEDIATE MEDICAL CARE IF:  Any of the following symptoms occur over the next 12 hours: Shaking chills. You have a temperature by mouth above 102 F (38.9 C), not controlled by medicine. Chest, back, or muscle pain. People around you feel you are not acting correctly or are confused. Shortness of breath or difficulty breathing. Dizziness and fainting. You get a rash or develop hives. You have a decrease in urine output. Your urine turns a dark color or changes to pink, red, or brown. Any of the following symptoms occur over the next 10 days: You have a temperature by mouth above 102 F (38.9 C), not controlled by medicine. Shortness of breath. Weakness after normal activity. The white part of the eye turns yellow (jaundice). You have a decrease in the amount of urine or are urinating less often. Your urine turns a dark color or changes to pink, red, or brown. Document Released: 02/13/2000 Document Revised: 05/10/2011 Document Reviewed: 10/02/2007 [facility] Patient Information 2014 [place],  [place].  _______________________________________________________________________

## 2022-05-14 ENCOUNTER — Encounter (HOSPITAL_COMMUNITY): Payer: Self-pay

## 2022-05-14 ENCOUNTER — Encounter (HOSPITAL_COMMUNITY)
Admission: RE | Admit: 2022-05-14 | Discharge: 2022-05-14 | Disposition: A | Discharge status: Home / Self Care | Payer: PPO | Source: Ambulatory Visit | Attending: Surgery | Admitting: Surgery

## 2022-05-14 ENCOUNTER — Other Ambulatory Visit: Payer: Self-pay

## 2022-05-14 VITALS — BP 129/87 | HR 80 | Temp 98.0°F | Ht 67.0 in | Wt 142.0 lb

## 2022-05-14 DIAGNOSIS — Z01818 Encounter for other preprocedural examination: Secondary | ICD-10-CM | POA: Insufficient documentation

## 2022-05-14 DIAGNOSIS — I493 Ventricular premature depolarization: Secondary | ICD-10-CM | POA: Insufficient documentation

## 2022-05-14 HISTORY — DX: Nonrheumatic mitral (valve) prolapse: I34.1

## 2022-05-14 HISTORY — DX: Nonrheumatic mitral (valve) prolapse: O99.419

## 2022-05-14 LAB — BASIC METABOLIC PANEL
Anion gap: 9 (ref 5–15)
BUN: 17 mg/dL (ref 8–23)
CO2: 27 mmol/L (ref 22–32)
Calcium: 9.1 mg/dL (ref 8.9–10.3)
Chloride: 100 mmol/L (ref 98–111)
Creatinine, Ser: 0.76 mg/dL (ref 0.44–1.00)
GFR, Estimated: >60 mL/min (ref >60)
Glucose, Bld: 104 mg/dL — ABNORMAL HIGH (ref 70–99)
Potassium: 3.6 mmol/L (ref 3.5–5.1)
Sodium: 136 mmol/L (ref 135–145)

## 2022-05-14 LAB — CBC
HCT: 38.9 % (ref 36.0–46.0)
Hemoglobin: 13 g/dL (ref 12.0–15.0)
MCH: 32 pg (ref 26.0–34.0)
MCHC: 33.4 g/dL (ref 30.0–36.0)
MCV: 95.8 fL (ref 80.0–100.0)
Platelets: 272 10*3/uL (ref 150–400)
RBC: 4.06 MIL/uL (ref 3.87–5.11)
RDW: 12.4 % (ref 11.5–15.5)
WBC: 8 10*3/uL (ref 4.0–10.5)
nRBC: 0 % (ref 0.0–0.2)

## 2022-05-14 NOTE — Progress Notes (Signed)
For Short Stay: [place] appointment date:  Bowel Prep reminder:   For AneDr. Legrand Como badgersthesia: PCP -  Cardiologist - N/A  Chest x-ray -  EKG -  Stress Test -  ECHO -  Cardiac Cath -  Pacemaker/ICD device last checked: Pacemaker orders received: Device Rep notified:  Spinal Cord Stimulator:  Sleep Study -  CPAP -   Fasting Blood Sugar -  Checks Blood Sugar _____ times a day Date and result of last Hgb A1c-  Last dose of GLP1 agonist-  GLP1 instructions:   Last dose of SGLT-2 inhibitors-  SGLT-2 instructions:   Blood Thinner Instructions: Aspirin Instructions: Last Dose:  Activity level: Can go up a flight of stairs and activities of daily living without stopping and without chest pain and/or shortness of breath   Able to exercise without chest pain and/or shortness of breath  Anesthesia review: Mitral valve prolapse during pregnancy 30 years ago.  Patient denies shortness of breath, fever, cough and chest pain at PAT appointment   Patient verbalized understanding of instructions that were given to them at the PAT appointment. Patient was also instructed that they will need to review over the PAT instructions again at home before surgery.

## 2022-05-17 ENCOUNTER — Other Ambulatory Visit: Payer: Self-pay

## 2022-05-17 ENCOUNTER — Inpatient Hospital Stay (HOSPITAL_COMMUNITY): Payer: PPO | Admitting: Anesthesiology

## 2022-05-17 ENCOUNTER — Inpatient Hospital Stay (HOSPITAL_COMMUNITY)
Admission: RE | Admit: 2022-05-17 | Discharge: 2022-05-20 | DRG: 331 | Disposition: A | Discharge status: Home / Self Care | Payer: PPO | Attending: Surgery | Admitting: Surgery

## 2022-05-17 ENCOUNTER — Encounter (HOSPITAL_COMMUNITY)
Admission: RE | Disposition: A | Discharge status: Home / Self Care | Payer: Self-pay | Source: Home / Self Care | Attending: Surgery

## 2022-05-17 ENCOUNTER — Inpatient Hospital Stay (HOSPITAL_COMMUNITY): Payer: PPO | Admitting: Emergency Medicine

## 2022-05-17 ENCOUNTER — Encounter (HOSPITAL_COMMUNITY): Payer: Self-pay | Admitting: Surgery

## 2022-05-17 DIAGNOSIS — Z803 Family history of malignant neoplasm of breast: Secondary | ICD-10-CM

## 2022-05-17 DIAGNOSIS — C171 Malignant neoplasm of jejunum: Principal | ICD-10-CM | POA: Diagnosis present

## 2022-05-17 DIAGNOSIS — Z801 Family history of malignant neoplasm of trachea, bronchus and lung: Secondary | ICD-10-CM

## 2022-05-17 DIAGNOSIS — Z9013 Acquired absence of bilateral breasts and nipples: Secondary | ICD-10-CM | POA: Diagnosis not present

## 2022-05-17 DIAGNOSIS — Z8261 Family history of arthritis: Secondary | ICD-10-CM

## 2022-05-17 DIAGNOSIS — Z79899 Other long term (current) drug therapy: Secondary | ICD-10-CM | POA: Diagnosis not present

## 2022-05-17 DIAGNOSIS — K6389 Other specified diseases of intestine: Principal | ICD-10-CM | POA: Diagnosis present

## 2022-05-17 DIAGNOSIS — Z83438 Family history of other disorder of lipoprotein metabolism and other lipidemia: Secondary | ICD-10-CM

## 2022-05-17 DIAGNOSIS — E785 Hyperlipidemia, unspecified: Secondary | ICD-10-CM | POA: Diagnosis present

## 2022-05-17 DIAGNOSIS — Z853 Personal history of malignant neoplasm of breast: Secondary | ICD-10-CM

## 2022-05-17 DIAGNOSIS — Z800 Family history of malignant neoplasm of digestive organs: Secondary | ICD-10-CM

## 2022-05-17 DIAGNOSIS — Z808 Family history of malignant neoplasm of other organs or systems: Secondary | ICD-10-CM | POA: Diagnosis not present

## 2022-05-17 DIAGNOSIS — Z9882 Breast implant status: Secondary | ICD-10-CM

## 2022-05-17 HISTORY — PX: BOWEL RESECTION: SHX1257

## 2022-05-17 LAB — CBC
HCT: 36.5 % (ref 36.0–46.0)
Hemoglobin: 12.2 g/dL (ref 12.0–15.0)
MCH: 32.3 pg (ref 26.0–34.0)
MCHC: 33.4 g/dL (ref 30.0–36.0)
MCV: 96.6 fL (ref 80.0–100.0)
Platelets: 238 10*3/uL (ref 150–400)
RBC: 3.78 MIL/uL — ABNORMAL LOW (ref 3.87–5.11)
RDW: 12.2 % (ref 11.5–15.5)
WBC: 12.4 10*3/uL — ABNORMAL HIGH (ref 4.0–10.5)
nRBC: 0 % (ref 0.0–0.2)

## 2022-05-17 LAB — CREATININE, SERUM
Creatinine, Ser: 0.66 mg/dL (ref 0.44–1.00)
GFR, Estimated: >60 mL/min (ref >60)

## 2022-05-17 LAB — TYPE AND SCREEN
ABO/RH(D): O POS
Antibody Screen: NEGATIVE

## 2022-05-17 LAB — ABO/RH: ABO/RH(D): O POS

## 2022-05-17 SURGERY — EXCISION, SMALL INTESTINE
Anesthesia: General | Site: Abdomen

## 2022-05-17 MED ORDER — ENSURE MAX PROTEIN PO LIQD
11.0000 [oz_av] | Freq: Three times a day (TID) | ORAL | Status: DC
  Administered 2022-05-17 – 2022-05-19 (×6): 11 [oz_av] via ORAL
  Administered 2022-05-20: 1 via ORAL

## 2022-05-17 MED ORDER — HYDROMORPHONE HCL 1 MG/ML IJ SOLN: 0.2500 mg | INTRAMUSCULAR | Status: DC | PRN

## 2022-05-17 MED ORDER — 0.9 % SODIUM CHLORIDE (POUR BTL) OPTIME
TOPICAL | Status: DC | PRN
  Administered 2022-05-17: 2000 mL

## 2022-05-17 MED ORDER — OXYCODONE HCL 5 MG PO TABS: 5.0000 mg | ORAL_TABLET | Freq: Once | ORAL | Status: AC | PRN

## 2022-05-17 MED ORDER — HYDROMORPHONE HCL 1 MG/ML IJ SOLN
INTRAMUSCULAR | Status: AC
  Administered 2022-05-17: 0.25 mg via INTRAVENOUS
  Filled 2022-05-17: qty 1

## 2022-05-17 MED ORDER — BUPIVACAINE LIPOSOME 1.3 % IJ SUSP: 20.0000 mL | Freq: Once | INTRAMUSCULAR | Status: DC

## 2022-05-17 MED ORDER — PROMETHAZINE HCL 25 MG/ML IJ SOLN: 6.2500 mg | INTRAMUSCULAR | Status: DC | PRN

## 2022-05-17 MED ORDER — KETAMINE HCL 10 MG/ML IJ SOLN
INTRAMUSCULAR | Status: AC
  Filled 2022-05-17: qty 1

## 2022-05-17 MED ORDER — FENTANYL CITRATE (PF) 100 MCG/2ML IJ SOLN
INTRAMUSCULAR | Status: DC | PRN
  Administered 2022-05-17: 100 ug via INTRAVENOUS

## 2022-05-17 MED ORDER — ROCURONIUM BROMIDE 10 MG/ML (PF) SYRINGE
PREFILLED_SYRINGE | INTRAVENOUS | Status: AC
  Filled 2022-05-17: qty 10

## 2022-05-17 MED ORDER — ORAL CARE MOUTH RINSE: 15.0000 mL | Freq: Once | OROMUCOSAL | Status: AC

## 2022-05-17 MED ORDER — HYDROMORPHONE HCL 1 MG/ML IJ SOLN
INTRAMUSCULAR | Status: AC
  Administered 2022-05-17: 0.5 mg via INTRAVENOUS
  Filled 2022-05-17: qty 2

## 2022-05-17 MED ORDER — HYDROMORPHONE HCL 1 MG/ML IJ SOLN: 0.5000 mg | INTRAMUSCULAR | Status: DC | PRN

## 2022-05-17 MED ORDER — FAMOTIDINE 20 MG PO TABS
40.0000 mg | ORAL_TABLET | Freq: Every day | ORAL | Status: DC | PRN
  Administered 2022-05-17: 40 mg via ORAL
  Filled 2022-05-17: qty 2

## 2022-05-17 MED ORDER — SUGAMMADEX SODIUM 200 MG/2ML IV SOLN
INTRAVENOUS | Status: DC | PRN
  Administered 2022-05-17: 200 mg via INTRAVENOUS

## 2022-05-17 MED ORDER — ACETAMINOPHEN 500 MG PO TABS
1000.0000 mg | ORAL_TABLET | ORAL | Status: AC
  Administered 2022-05-17: 1000 mg via ORAL
  Filled 2022-05-17: qty 2

## 2022-05-17 MED ORDER — METHOCARBAMOL 1000 MG/10ML IJ SOLN: 500.0000 mg | Freq: Four times a day (QID) | INTRAVENOUS | Status: DC | PRN

## 2022-05-17 MED ORDER — ONDANSETRON HCL 4 MG/2ML IJ SOLN: 4.0000 mg | Freq: Four times a day (QID) | INTRAMUSCULAR | Status: DC | PRN

## 2022-05-17 MED ORDER — CHLORHEXIDINE GLUCONATE CLOTH 2 % EX PADS: 6.0000 | MEDICATED_PAD | Freq: Once | CUTANEOUS | Status: DC

## 2022-05-17 MED ORDER — OXYCODONE HCL 5 MG/5ML PO SOLN
ORAL | Status: AC
  Administered 2022-05-17: 5 mg via ORAL
  Filled 2022-05-17: qty 5

## 2022-05-17 MED ORDER — PHENYLEPHRINE 80 MCG/ML (10ML) SYRINGE FOR IV PUSH (FOR BLOOD PRESSURE SUPPORT)
PREFILLED_SYRINGE | INTRAVENOUS | Status: DC | PRN
  Administered 2022-05-17: 160 ug via INTRAVENOUS
  Administered 2022-05-17: 80 ug via INTRAVENOUS
  Administered 2022-05-17 (×3): 160 ug via INTRAVENOUS

## 2022-05-17 MED ORDER — CHLORHEXIDINE GLUCONATE 0.12 % MT SOLN
15.0000 mL | Freq: Once | OROMUCOSAL | Status: AC
  Administered 2022-05-17: 15 mL via OROMUCOSAL

## 2022-05-17 MED ORDER — DOCUSATE SODIUM 100 MG PO CAPS
100.0000 mg | ORAL_CAPSULE | Freq: Two times a day (BID) | ORAL | Status: DC
  Administered 2022-05-17 – 2022-05-20 (×7): 100 mg via ORAL
  Filled 2022-05-17 (×7): qty 1

## 2022-05-17 MED ORDER — ONDANSETRON 4 MG PO TBDP: 4.0000 mg | ORAL_TABLET | Freq: Four times a day (QID) | ORAL | Status: DC | PRN

## 2022-05-17 MED ORDER — PHENYLEPHRINE HCL (PRESSORS) 10 MG/ML IV SOLN
INTRAVENOUS | Status: AC
  Filled 2022-05-17: qty 1

## 2022-05-17 MED ORDER — LIDOCAINE HCL (PF) 2 % IJ SOLN
INTRAMUSCULAR | Status: AC
  Filled 2022-05-17: qty 5

## 2022-05-17 MED ORDER — LIDOCAINE 2% (20 MG/ML) 5 ML SYRINGE
INTRAMUSCULAR | Status: DC | PRN
  Administered 2022-05-17: 20 mg via INTRAVENOUS

## 2022-05-17 MED ORDER — PHENYLEPHRINE HCL-NACL 20-0.9 MG/250ML-% IV SOLN
INTRAVENOUS | Status: DC | PRN
  Administered 2022-05-17: 30 ug/min via INTRAVENOUS

## 2022-05-17 MED ORDER — SCOPOLAMINE 1 MG/3DAYS TD PT72
1.0000 | MEDICATED_PATCH | TRANSDERMAL | Status: DC
  Administered 2022-05-17: 1.5 mg via TRANSDERMAL
  Filled 2022-05-17: qty 1

## 2022-05-17 MED ORDER — OXYCODONE HCL 5 MG PO TABS
10.0000 mg | ORAL_TABLET | ORAL | Status: DC | PRN
  Administered 2022-05-18 – 2022-05-20 (×12): 10 mg via ORAL
  Filled 2022-05-17 (×13): qty 2

## 2022-05-17 MED ORDER — MIDAZOLAM HCL 2 MG/2ML IJ SOLN: 0.5000 mg | Freq: Once | INTRAMUSCULAR | Status: DC | PRN

## 2022-05-17 MED ORDER — OXYCODONE HCL 5 MG/5ML PO SOLN: 5.0000 mg | Freq: Once | ORAL | Status: AC | PRN

## 2022-05-17 MED ORDER — LIDOCAINE HCL 2 % IJ SOLN
INTRAMUSCULAR | Status: AC
  Filled 2022-05-17: qty 20

## 2022-05-17 MED ORDER — CYCLOSPORINE 0.05 % OP EMUL
1.0000 [drp] | Freq: Two times a day (BID) | OPHTHALMIC | Status: DC
  Administered 2022-05-18 – 2022-05-19 (×2): 1 [drp] via OPHTHALMIC
  Filled 2022-05-17 (×7): qty 30

## 2022-05-17 MED ORDER — DEXAMETHASONE SODIUM PHOSPHATE 10 MG/ML IJ SOLN
INTRAMUSCULAR | Status: DC | PRN
  Administered 2022-05-17: 10 mg via INTRAVENOUS

## 2022-05-17 MED ORDER — ACETAMINOPHEN 500 MG PO TABS: 1000.0000 mg | ORAL_TABLET | Freq: Once | ORAL | Status: AC

## 2022-05-17 MED ORDER — LIDOCAINE HCL (PF) 2 % IJ SOLN
INTRAMUSCULAR | Status: DC | PRN
  Administered 2022-05-17: 1.5 mg/kg/h via INTRADERMAL

## 2022-05-17 MED ORDER — LACTATED RINGERS IV SOLN: INTRAVENOUS | Status: DC

## 2022-05-17 MED ORDER — ONDANSETRON HCL 4 MG/2ML IJ SOLN
INTRAMUSCULAR | Status: AC
  Filled 2022-05-17: qty 2

## 2022-05-17 MED ORDER — SIMETHICONE 80 MG PO CHEW: 80.0000 mg | CHEWABLE_TABLET | Freq: Four times a day (QID) | ORAL | Status: DC | PRN

## 2022-05-17 MED ORDER — GABAPENTIN 300 MG PO CAPS
300.0000 mg | ORAL_CAPSULE | ORAL | Status: AC
  Administered 2022-05-17: 300 mg via ORAL
  Filled 2022-05-17: qty 1

## 2022-05-17 MED ORDER — MEPERIDINE HCL 50 MG/ML IJ SOLN: 6.2500 mg | INTRAMUSCULAR | Status: DC | PRN

## 2022-05-17 MED ORDER — PROCHLORPERAZINE EDISYLATE 10 MG/2ML IJ SOLN: 10.0000 mg | INTRAMUSCULAR | Status: DC | PRN

## 2022-05-17 MED ORDER — OXYCODONE HCL 5 MG PO TABS
5.0000 mg | ORAL_TABLET | ORAL | Status: DC | PRN
  Administered 2022-05-17: 5 mg via ORAL
  Filled 2022-05-17: qty 1

## 2022-05-17 MED ORDER — OXYCODONE-ACETAMINOPHEN 5-325 MG PO TABS: 1.0000 | ORAL_TABLET | ORAL | 0 refills | Status: DC | PRN

## 2022-05-17 MED ORDER — ROSUVASTATIN CALCIUM 10 MG PO TABS
10.0000 mg | ORAL_TABLET | Freq: Every day | ORAL | Status: DC
  Administered 2022-05-17 – 2022-05-19 (×3): 10 mg via ORAL
  Filled 2022-05-17 (×3): qty 1

## 2022-05-17 MED ORDER — HYDROMORPHONE HCL 1 MG/ML IJ SOLN
0.2500 mg | INTRAMUSCULAR | Status: DC | PRN
  Administered 2022-05-17 (×3): 0.5 mg via INTRAVENOUS

## 2022-05-17 MED ORDER — PROPOFOL 10 MG/ML IV BOLUS
INTRAVENOUS | Status: DC | PRN
  Administered 2022-05-17: 150 mg via INTRAVENOUS

## 2022-05-17 MED ORDER — ACETAMINOPHEN 325 MG PO TABS
650.0000 mg | ORAL_TABLET | Freq: Four times a day (QID) | ORAL | Status: DC
  Administered 2022-05-17 – 2022-05-20 (×13): 650 mg via ORAL
  Filled 2022-05-17 (×13): qty 2

## 2022-05-17 MED ORDER — MELATONIN 5 MG PO TABS
10.0000 mg | ORAL_TABLET | Freq: Every day | ORAL | Status: DC
  Administered 2022-05-17 – 2022-05-19 (×3): 10 mg via ORAL
  Filled 2022-05-17 (×5): qty 2

## 2022-05-17 MED ORDER — DEXAMETHASONE SODIUM PHOSPHATE 10 MG/ML IJ SOLN
INTRAMUSCULAR | Status: AC
  Filled 2022-05-17: qty 1

## 2022-05-17 MED ORDER — ENOXAPARIN SODIUM 40 MG/0.4ML IJ SOSY
40.0000 mg | PREFILLED_SYRINGE | INTRAMUSCULAR | Status: DC
  Administered 2022-05-18 – 2022-05-20 (×3): 40 mg via SUBCUTANEOUS
  Filled 2022-05-17 (×3): qty 0.4

## 2022-05-17 MED ORDER — MIDAZOLAM HCL 2 MG/2ML IJ SOLN
INTRAMUSCULAR | Status: AC
  Filled 2022-05-17: qty 2

## 2022-05-17 MED ORDER — FENTANYL CITRATE (PF) 100 MCG/2ML IJ SOLN
INTRAMUSCULAR | Status: AC
  Filled 2022-05-17: qty 2

## 2022-05-17 MED ORDER — ROCURONIUM BROMIDE 10 MG/ML (PF) SYRINGE
PREFILLED_SYRINGE | INTRAVENOUS | Status: DC | PRN
  Administered 2022-05-17 (×3): 10 mg via INTRAVENOUS
  Administered 2022-05-17: 50 mg via INTRAVENOUS

## 2022-05-17 MED ORDER — ONDANSETRON HCL 4 MG/2ML IJ SOLN
INTRAMUSCULAR | Status: DC | PRN
  Administered 2022-05-17: 4 mg via INTRAVENOUS

## 2022-05-17 MED ORDER — PROPOFOL 10 MG/ML IV BOLUS
INTRAVENOUS | Status: AC
  Filled 2022-05-17: qty 20

## 2022-05-17 MED ORDER — CEFAZOLIN SODIUM-DEXTROSE 2-4 GM/100ML-% IV SOLN
2.0000 g | INTRAVENOUS | Status: AC
  Administered 2022-05-17: 2 g via INTRAVENOUS
  Filled 2022-05-17: qty 100

## 2022-05-17 MED ORDER — MIDAZOLAM HCL 5 MG/5ML IJ SOLN
INTRAMUSCULAR | Status: DC | PRN
  Administered 2022-05-17: 2 mg via INTRAVENOUS

## 2022-05-17 SURGICAL SUPPLY — 43 items
ADH SKN CLS APL DERMABOND .7 (GAUZE/BANDAGES/DRESSINGS) ×1
APL PRP STRL LF DISP 70% ISPRP (MISCELLANEOUS)
BAG COUNTER SPONGE SURGICOUNT (BAG) IMPLANT
BAG SPNG CNTER NS LX DISP (BAG)
BLADE EXTENDED COATED 6.5IN (ELECTRODE) IMPLANT
CHLORAPREP W/TINT 26 (MISCELLANEOUS) IMPLANT
COVER MAYO STAND STRL (DRAPES) ×1 IMPLANT
COVER SURGICAL LIGHT HANDLE (MISCELLANEOUS) ×1 IMPLANT
DERMABOND ADVANCED .7 DNX12 (GAUZE/BANDAGES/DRESSINGS) IMPLANT
DRAIN CHANNEL 19F RND (DRAIN) IMPLANT
DRAPE LAPAROSCOPIC ABDOMINAL (DRAPES) ×1 IMPLANT
DRSG OPSITE POSTOP 4X10 (GAUZE/BANDAGES/DRESSINGS) IMPLANT
DRSG OPSITE POSTOP 4X6 (GAUZE/BANDAGES/DRESSINGS) IMPLANT
DRSG OPSITE POSTOP 4X8 (GAUZE/BANDAGES/DRESSINGS) IMPLANT
ELECT REM PT RETURN 15FT ADLT (MISCELLANEOUS) ×1 IMPLANT
EVACUATOR SILICONE 100CC (DRAIN) IMPLANT
GLOVE BIO SURGEON STRL SZ 6 (GLOVE) ×2 IMPLANT
GLOVE INDICATOR 6.5 STRL GRN (GLOVE) ×2 IMPLANT
GOWN STRL REUS W/ TWL XL LVL3 (GOWN DISPOSABLE) ×1 IMPLANT
GOWN STRL REUS W/TWL XL LVL3 (GOWN DISPOSABLE) ×1
KIT TURNOVER KIT A (KITS) IMPLANT
LIGASURE IMPACT 36 18CM CVD LR (INSTRUMENTS) IMPLANT
PACK GENERAL/GYN (CUSTOM PROCEDURE TRAY) ×1 IMPLANT
RELOAD STAPLE 60 2.6 WHT THN (STAPLE) IMPLANT
RELOAD STAPLER WHITE 60MM (STAPLE) ×6 IMPLANT
STAPLE ECHEON FLEX 60 POW ENDO (STAPLE) IMPLANT
STAPLER RELOAD WHITE 60MM (STAPLE) ×6
STAPLER VISISTAT 35W (STAPLE) IMPLANT
SUT ETHILON 3 0 PS 1 (SUTURE) IMPLANT
SUT MNCRL AB 4-0 PS2 18 (SUTURE) IMPLANT
SUT NOVA T20/GS 25 (SUTURE) IMPLANT
SUT SILK 2 0 (SUTURE) ×1
SUT SILK 2 0 SH CR/8 (SUTURE) ×1 IMPLANT
SUT SILK 2-0 18XBRD TIE 12 (SUTURE) ×1 IMPLANT
SUT SILK 3 0 (SUTURE) ×1
SUT SILK 3 0 SH CR/8 (SUTURE) ×1 IMPLANT
SUT SILK 3-0 18XBRD TIE 12 (SUTURE) ×1 IMPLANT
SUT VIC AB 2-0 SH 18 (SUTURE) IMPLANT
SUT VIC AB 3-0 SH 18 (SUTURE) IMPLANT
TOWEL OR 17X26 10 PK STRL BLUE (TOWEL DISPOSABLE) IMPLANT
TOWEL OR NON WOVEN STRL DISP B (DISPOSABLE) ×1 IMPLANT
TRAY FOLEY MTR SLVR 14FR STAT (SET/KITS/TRAYS/PACK) ×1 IMPLANT
TRAY FOLEY MTR SLVR 16FR STAT (SET/KITS/TRAYS/PACK) ×1 IMPLANT

## 2022-05-17 NOTE — Anesthesia Procedure Notes (Signed)
Procedure Name: Intubation Date/Time: 05/17/2022 7:26 AM  Performed by: Lind Covert, CRNAPre-anesthesia Checklist: Patient identified, Emergency Drugs available, Suction available, Patient being monitored and Timeout performed Patient Re-evaluated:Patient Re-evaluated prior to induction Oxygen Delivery Method: Circle system utilized Preoxygenation: Pre-oxygenation with 100% oxygen Induction Type: IV induction Ventilation: Mask ventilation without difficulty Laryngoscope Size: Mac and 3 Grade View: Grade I Tube type: Oral Tube size: 7.0 mm Number of attempts: 1 Airway Equipment and Method: Stylet Placement Confirmation: ETT inserted through vocal cords under direct vision, positive ETCO2 and breath sounds checked- equal and bilateral Secured at: 22 cm Tube secured with: Tape Dental Injury: Teeth and Oropharynx as per pre-operative assessment

## 2022-05-17 NOTE — H&P (Signed)
Admitting Physician: Nickola Major Mintie Witherington  Service: General Surgery  CC: Small bowel mass  Subjective   HPI: [name] is an 67 y.o. female who is here for small bowel mass resection  Past Medical History:  Diagnosis Date   Arthritis    Backache, unspecified    Cancer ([place])    breast s/p BL mastectomies   Colon polyps    Family history of breast cancer 10/27/2021   Family history of colon cancer 10/27/2021   Hyperlipidemia    Mitral valve prolapse of mother during pregnancy    PONV (postoperative nausea and vomiting)     Past Surgical History:  Procedure Laterality Date   BREAST RECONSTRUCTION WITH PLACEMENT OF TISSUE EXPANDER AND FLEX HD (ACELLULAR HYDRATED DERMIS) Bilateral 04/05/2019   Procedure: BILATERAL BREAST RECONSTRUCTION WITH PLACEMENT OF TISSUE EXPANDER AND FLEX HD (ACELLULAR HYDRATED DERMIS);  Surgeon: Wallace Going, DO;  Location: [place];  Service: Plastics;  Laterality: Bilateral;   ENDOMETRIAL ABLATION  1998   GIVENS CAPSULE STUDY N/A 12/04/2021   Procedure: GIVENS CAPSULE STUDY;  Surgeon: Juanita Craver, MD;  Location: [facility];  Service: Gastroenterology;  Laterality: N/A;   KNEE SURGERY Right 2007   LAPAROSCOPY N/A 04/14/2022   Procedure: LAPAROSCOPY DIAGNOSTIC, G-TUBE PLACEMENT; OMENTUM BIOPSY; LYSIS OF ADHESIONS;  Surgeon: Felicie Morn, MD;  Location: [facility];  Service: General;  Laterality: N/A;   LIPOSUCTION WITH LIPOFILLING Bilateral 01/30/2020   Procedure: LIPOSUCTION WITH LIPOFILLING;  Surgeon: Wallace Going, DO;  Location: [place];  Service: Plastics;  Laterality: Bilateral;  90 min, please   MASTECTOMY W/ SENTINEL NODE BIOPSY Bilateral 04/05/2019   Procedure: BILATERAL MASTECTOMIES WITH RIGHT SENTINEL LYMPH NODE BIOPSY;  Surgeon: [name], MD;  Location: [place];  Service: General;  Laterality: Bilateral;   REMOVAL OF BILATERAL TISSUE EXPANDERS WITH PLACEMENT  OF BILATERAL BREAST IMPLANTS Bilateral 09/26/2019   Procedure: REMOVAL OF BILATERAL TISSUE EXPANDERS WITH PLACEMENT OF BILATERAL BREAST IMPLANTS;  Surgeon: Wallace Going, DO;  Location: [place];  Service: Plastics;  Laterality: Bilateral;   RHINOPLASTY  1977   TONSILLECTOMY  1961    Family History  Problem Relation Age of Onset   Arthritis Mother    Colon cancer Mother 36   Melanoma Father        dx 42s; nose   Lung cancer Paternal Uncle        dx after 35; smoking hx   Breast cancer Maternal Grandmother        dx 80s   Hyperlipidemia Paternal Grandmother    Cancer Paternal Grandfather        unknown type; d. before 62   Colon cancer Other        MGM's mother    Social:  reports that she has never smoked. She has never used smokeless tobacco. She reports that she does not currently use alcohol. She reports that she does not use drugs.  Allergies: No Known Allergies  Medications: Current Outpatient Medications  Medication Instructions   acetaminophen (TYLENOL) 1,000 mg, Oral, Every 6 hours PRN   calcium carbonate (OS-CAL - DOSED IN MG OF ELEMENTAL CALCIUM) 1250 (500 Ca) MG tablet 1 tablet, Oral, 2 times daily with meals   Calcium Carbonate Antacid (TUMS PO) 3-4 tablets, Oral, As needed   cycloSPORINE (RESTASIS) 0.05 % ophthalmic emulsion 1 drop, Both Eyes, 2 times daily   Ensure Max Protein (ENSURE MAX PROTEIN) LIQD 11 oz,  Oral, 3 times daily   famotidine (PEPCID) 40 mg, Oral, Daily PRN   Melatonin 10 mg, Oral, Daily at bedtime   Multiple Vitamin (MULTI-VITAMIN DAILY PO) 1 tablet, Oral, Daily   omega-3 acid ethyl esters (LOVAZA) 1 g, 2 times daily   ondansetron (ZOFRAN-ODT) 4 mg, Oral, Every 6 hours PRN   rosuvastatin (CRESTOR) 10 mg, Oral, Daily at bedtime   Vitamin D3 5,000 Units, Oral, Every Mon    ROS - all of the below systems have been reviewed with the patient and positives are indicated with bold text General: chills, fever or night  sweats Eyes: blurry vision or double vision ENT: epistaxis or sore throat Allergy/Immunology: itchy/watery eyes or nasal congestion Hematologic/Lymphatic: bleeding problems, blood clots or swollen lymph nodes Endocrine: temperature intolerance or unexpected weight changes Breast: new or changing breast lumps or nipple discharge Resp: cough, shortness of breath, or wheezing CV: chest pain or dyspnea on exertion GI: as per HPI GU: dysuria, trouble voiding, or hematuria MSK: joint pain or joint stiffness Neuro: TIA or stroke symptoms Derm: pruritus and skin lesion changes Psych: anxiety and depression  Objective   PE Blood pressure (!) 124/96, pulse 83, temperature 97.9 F (36.6 C), temperature source Oral, resp. rate 16, height 5\' 7"  (1.702 m), weight 64.4 kg, SpO2 100 %. Constitutional: NAD; conversant; no deformities Eyes: Moist conjunctiva; no lid lag; anicteric; PERRL Neck: Trachea midline; no thyromegaly Lungs: Normal respiratory effort; no tactile fremitus CV: RRR; no palpable thrills; no pitting edema GI: Abd Soft, nontender; no palpable hepatosplenomegaly MSK: Normal range of motion of extremities; no clubbing/cyanosis Psychiatric: Appropriate affect; alert and oriented x3 Lymphatic: No palpable cervical or axillary lymphadenopathy  Results for orders placed or performed during the hospital encounter of 05/17/22 (from the past 24 hour(s))  ABO/Rh     Status: None   Collection Time: 05/17/22  5:54 AM  Result Value Ref Range   ABO/RH(D)      O POS Performed at [facility], [place] [address], [place], [place] [address]     Imaging Orders  No imaging studies ordered today     Assessment and Plan   [name] is a 67 y.o. female who underwent laparoscopic G tube placement, omental biopsy, lysis of adhesions, and diagnostic laparoscopy on 04/14/22. 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 She has recovered well from her G-tube placement and the pathology results have returned and she completed the PET scan. Based on the staging so far, her malignancy appears limited to 2 locations in the small intestine. I assume this correlates with the 2 abnormal areas I saw the laparoscopically. After discussion with the patient's oncologist Benay Pike, MD we decided to offer the patient open small bowel resection to remove these 2 areas of malignancy. We discussed the procedure itself as well as its risk, benefits, and alternatives. I explained why would like to perform this with open surgery in order to palpate the intestine, and allow for easier anastomosis as one of the lesions was closer to the ligament of Treitz. I explained she would need 2 separate small bowel resections as there is a good length of normal bowel between the two abnormalities. After full discussion all questions answered the  patient granted consent to proceed.    Felicie Morn, MD  [facility] Use [email or web address] to contact on call provider

## 2022-05-17 NOTE — Anesthesia Postprocedure Evaluation (Signed)
Anesthesia Post Note  Patient: [name]  Procedure(s) Performed: SMALL BOWEL RESECTION (Abdomen)     Patient location during evaluation: PACU Anesthesia Type: General Level of consciousness: awake and alert, patient cooperative and oriented Pain management: pain level controlled Vital Signs Assessment: post-procedure vital signs reviewed and stable Respiratory status: spontaneous breathing, nonlabored ventilation and respiratory function stable Cardiovascular status: blood pressure returned to baseline and stable Postop Assessment: no apparent nausea or vomiting Anesthetic complications: no   No notable events documented.  Last Vitals:  Vitals:   05/17/22 1015 05/17/22 1030  BP: 112/85 100/73  Pulse: 69 65  Resp: 19 (!) 22  Temp:    SpO2: 99% 97%    Last Pain:  Vitals:   05/17/22 1030  TempSrc:   PainSc: 6                  Keliah Harned,E. Sukhmani Fetherolf

## 2022-05-17 NOTE — Anesthesia Preprocedure Evaluation (Addendum)
Anesthesia Evaluation  Patient identified by MRN, date of birth, ID band Patient awake    Reviewed: Allergy & Precautions, NPO status , Patient's Chart, lab work & pertinent test results  History of Anesthesia Complications (+) PONV  Airway Mallampati: II  TM Distance: >3 FB Neck ROM: Full    Dental  (+) Dental Advisory Given   Pulmonary neg pulmonary ROS   breath sounds clear to auscultation       Cardiovascular negative cardio ROS  Rhythm:Regular Rate:Normal     Neuro/Psych negative neurological ROS  negative psych ROS   GI/Hepatic Neg liver ROS,GERD  Medicated and Controlled,,H/o SBO: small bowel mass   Endo/Other  negative endocrine ROS    Renal/GU negative Renal ROS     Musculoskeletal   Abdominal   Peds  Hematology negative hematology ROS (+)   Anesthesia Other Findings H/o breast cancer  Reproductive/Obstetrics                             Anesthesia Physical Anesthesia Plan  ASA: 3  Anesthesia Plan: General   Post-op Pain Management: Tylenol PO (pre-op)*   Induction: Intravenous  PONV Risk Score and Plan: 4 or greater and Ondansetron, Dexamethasone and Scopolamine patch - Pre-op  Airway Management Planned: Oral ETT  Additional Equipment: None  Intra-op Plan:   Post-operative Plan: Extubation in OR  Informed Consent: I have reviewed the patients History and Physical, chart, labs and discussed the procedure including the risks, benefits and alternatives for the proposed anesthesia with the patient or authorized representative who has indicated his/her understanding and acceptance.     Dental advisory given  Plan Discussed with: CRNA and Surgeon  Anesthesia Plan Comments:         Anesthesia Quick Evaluation

## 2022-05-17 NOTE — Transfer of Care (Signed)
Immediate Anesthesia Transfer of Care Note  Patient: [name]  Procedure(s) Performed: SMALL BOWEL RESECTION (Abdomen)  Patient Location: PACU  Anesthesia Type:General  Level of Consciousness: sedated  Airway & Oxygen Therapy: Patient Spontanous Breathing and Patient connected to face mask oxygen  Post-op Assessment: Report given to RN and Post -op Vital signs reviewed and stable  Post vital signs: Reviewed and stable  Last Vitals:  Vitals Value Taken Time  BP    Temp    Pulse    Resp    SpO2      Last Pain:  Vitals:   05/17/22 0559  TempSrc:   PainSc: 0-No pain      Patients Stated Pain Goal: 3 ([id number])  Complications: No notable events documented.

## 2022-05-17 NOTE — Op Note (Signed)
Patient: [name] ([birth year], [id number])  Date of Surgery: 05/17/2022   Preoperative Diagnosis: Small bowel mass x2  Postoperative Diagnosis: Small bowel mass x2   Surgical Procedure: SMALL BOWEL RESECTION: [id number] x2  Operative Team Members:  Surgeon(s) and Role:    * [name], Caroline Major, MD - Primary   Anesthesiologist: Caroline Asa, MD CRNA: Caroline Covert, CRNA; Caroline Lain, CRNA   Anesthesia: General   Fluids:  Total I/O In: 1700 [I.V.:1700] Out: 330 [Urine:300; 0000000  Complications: None  Drains:  none   Specimen:  ID Type Source Tests Collected by Time Destination  1 : mesenteric lymph node proximal Tissue PATH Other SURGICAL PATHOLOGY [name], Caroline Major, MD 05/17/2022 0827   2 : small bowel resection stitch marks proximal margin Tissue PATH GI tumor resection SURGICAL PATHOLOGY [name], Caroline Major, MD 05/17/2022 0830   3 : distal small bowel resection stitch marks proximal margin Tissue PATH GI tumor resection SURGICAL PATHOLOGY [name], Caroline Major, MD 05/17/2022 [phone]      Disposition:  PACU - hemodynamically stable.  Plan of Care: Admit to inpatient     Indications for Procedure:  [name] is a 67 y.o. female who underwent laparoscopic G tube placement, omental biopsy, lysis of adhesions, and diagnostic laparoscopy on 04/14/22. 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 She has recovered well from her G-tube placement and the pathology results have returned and she completed the PET scan. Based on the staging so far, her malignancy appears limited to 2 locations in the small intestine. I assume this correlates with the 2 abnormal areas I saw the  laparoscopically. After discussion with the patient's oncologist Caroline Pike, MD we decided to offer the patient open small bowel resection to remove these 2 areas of malignancy. We discussed the procedure itself as well as its risk, benefits, and alternatives. I explained why would like to perform this with open surgery in order to palpate the intestine, and allow for easier anastomosis as one of the lesions was closer to the ligament of Treitz. I explained she would need 2 separate small bowel resections as there is a good length of normal bowel between the two abnormalities. After full discussion all questions answered the patient granted consent to proceed.   Findings: Similar appearance to recent laparoscopic surgery with two small bowel masses, one ~20 cm distal to the ligament of treitz, one in the distal jejunum or ileum.   Description of Procedure:   On the date stated above the patient was taken operating room suite and placed in supine position.  General endotracheal anesthesia was induced.  Patient's abdomen was prepped and draped in usual sterile fashion.  Antibiotics were given prior to the case start.  A Foley catheter was placed.  The patient's abdomen was prepped and draped in usual sterile fashion.  A timeout was completed verifying the correct patient, procedure, positioning, and equipment needed for the case.  I made a midline laparotomy incision and entered the abdomen without any trauma to the underlying viscera.  The abdomen was inspected.  I had a similar appearance to the previous laparoscopic surgery.  The G-tube remained in place throughout the case.  The liver was palpated and there were no  clear liver masses palpated.  The small bowel was run from the ligament of Treitz to terminal ileum.  There was a small bowel mass tattooed about 20 cm from the ligament of Treitz and a small bowel mass in the distal jejunum or proximal ileum.  I began by resecting the proximal small bowel  mass.  The Bookwalter retractor was brought onto the field to provide retraction.  Due to its proximity to the ligament of Treitz I mobilized the ligament of Treitz circumferentially to free up the bowel to create the anastomosis.  The mesentery in this area was palpated and there was one abnormal feeling lymph node.  I did not feel it would be able to be included in the specimen without devascularizing the surrounding small intestine so I dissected it out of the mesentery.  This was done with monopolar electrocautery and bipolar energy.  There was good hemostasis.  I then proceeded with the bowel resection.  The bowel was divided proximal to the mass with a small margin, due to the proximity to the ligament of Treitz.  The bowel was divided distal to the mass with a larger margin.  A white load of the endoscopic Echelon linear stapler was used to divide the bowel.  The mesentery was divided with the LigaSure.  The proximal staple line was marked with a suture and the specimen was passed off the field as the proximal small bowel resection.  I then created the anastomosis in side-to-side fashion using a stapler.  The antimesenteric borders were approximated using 0 Vicryl suture.  An enterotomy was created on the antimesenteric borders and a white load of the 60 mm endoscopic linear stapler was used to create the anastomosis.  The common enterotomy was closed using running Vicryl 2-0 suture in Cannell fashion.  The mesenteric defect was closed using running silk suture.  A additional Vicryl suture was used to reinforce the area where the 2 staple lines meet.  A few Vicryl sutures were used to reinforce the crotch of the staple line.  The area was irrigated and suctioned clean.  We then directed our attention to the distal small bowel mass.  As the bowel was more mobile in this area I was able to take a larger mesenteric specimen.  The mass was elevated and back lit with the OR lighting to evaluate the blood flow.  I  picked an area proximal and distal to the main vessel providing blood flow to this area and divided the small intestine using a white load of the endoscopic linear stapler.  The mesentery was then divided with the LigaSure working down along this vessel to provide some mesenteric portion of the specimen and a more oncologic resection.  The proximal staple line was marked with a suture and the specimen was passed off the field as the distal small bowel resection.  I then created an anastomosis in a similar fashion.  Enterotomies were made in the antimesenteric border near the staple line.  The 60 mm white load of the endoscopic linear stapler was used to create the anastomosis.  The common enterotomy was closed using running 2-0 Vicryl suture in Cannell fashion.  A suture was placed to reinforce this closure where the staple lines meet.  A 2-0 Vicryl suture was placed to reinforce the crotch of the anastomosis.  2-0 silk suture was used to close the mesenteric defect in a running fashion.  The area was irrigated and suctioned clean and the viscera were returned to  the abdomen.  The midline laparotomy was closed at the fascial level using 0 PDS taking small bites.  The wound was irrigated and closed using 3-0 Vicryl for the deep dermal layer and Monocryl and Dermabond for the skin.  All sponge and needle counts were correct at the end of this case.    At the end of the case we reviewed the infection status of the case. Patient: Private Patient Elective Case Case: Elective Infection Present At Time Of Surgery (PATOS):  some contamination related to creating small bowel anastomosis  Louanna Raw, MD General, Bariatric, & Minimally Invasive Surgery [facility], [place]

## 2022-05-18 ENCOUNTER — Encounter (HOSPITAL_COMMUNITY): Payer: Self-pay | Admitting: Surgery

## 2022-05-18 LAB — CBC
HCT: 33.3 % — ABNORMAL LOW (ref 36.0–46.0)
Hemoglobin: 10.7 g/dL — ABNORMAL LOW (ref 12.0–15.0)
MCH: 31.8 pg (ref 26.0–34.0)
MCHC: 32.1 g/dL (ref 30.0–36.0)
MCV: 99.1 fL (ref 80.0–100.0)
Platelets: 227 10*3/uL (ref 150–400)
RBC: 3.36 MIL/uL — ABNORMAL LOW (ref 3.87–5.11)
RDW: 12.5 % (ref 11.5–15.5)
WBC: 11.5 10*3/uL — ABNORMAL HIGH (ref 4.0–10.5)
nRBC: 0 % (ref 0.0–0.2)

## 2022-05-18 LAB — BASIC METABOLIC PANEL
Anion gap: 8 (ref 5–15)
BUN: 10 mg/dL (ref 8–23)
CO2: 25 mmol/L (ref 22–32)
Calcium: 8.3 mg/dL — ABNORMAL LOW (ref 8.9–10.3)
Chloride: 104 mmol/L (ref 98–111)
Creatinine, Ser: 0.67 mg/dL (ref 0.44–1.00)
GFR, Estimated: >60 mL/min (ref >60)
Glucose, Bld: 125 mg/dL — ABNORMAL HIGH (ref 70–99)
Potassium: 4.3 mmol/L (ref 3.5–5.1)
Sodium: 137 mmol/L (ref 135–145)

## 2022-05-18 NOTE — TOC CM/SW Note (Signed)
Transition of Care [facility]) Screening Note  Patient Details  Name: [name] Date of Birth: 09-30-1955  Transition of Care [facility]) CM/SW Contact:    Sherie Don, LCSW Phone Number: 05/18/2022, 8:49 AM  Transition of Care Department [facility]) has reviewed patient and no TOC needs have been identified at this time. We will continue to monitor patient advancement through interdisciplinary progression rounds. If new patient transition needs arise, please place a TOC consult.

## 2022-05-18 NOTE — Progress Notes (Signed)
Progress Note: General Surgery Service   Chief Complaint/Subjective: Passing some flatus, no bowel movements.  Pain okay with oxycodone  Objective: Vital signs in last 24 hours: Temp:  [97.5 F (36.4 C)-99 F (37.2 C)] 98.3 F (36.8 C) (03/19 0658) Pulse Rate:  [61-90] 74 (03/19 0658) Resp:  [12-25] 18 (03/19 0658) BP: (89-124)/(61-96) 96/63 (03/19 0658) SpO2:  [90 %-100 %] 97 % (03/19 0658) Last BM Date : 05/16/22  Intake/Output from previous day: 03/18 0701 - 03/19 0700 In: 5050 [P.O.:1800; I.V.:3250] Out: 3980 [Urine:3950; Blood:30] Intake/Output this shift: No intake/output data recorded.  Constitutional: NAD; conversant; no deformities Eyes: Moist conjunctiva; no lid lag; anicteric; PERRL Neck: Trachea midline; no thyromegaly Lungs: Normal respiratory effort; no tactile fremitus CV: RRR; no palpable thrills; no pitting edema GI: Abd incision c/d/I w/ glue; no palpable hepatosplenomegaly MSK: Normal range of motion of extremities; no clubbing/cyanosis Psychiatric: Appropriate affect; alert and oriented x3 Lymphatic: No palpable cervical or axillary lymphadenopathy  Lab Results: CBC  Recent Labs    05/17/22 1245 05/18/22 0510  WBC 12.4* 11.5*  HGB 12.2 10.7*  HCT 36.5 33.3*  PLT 238 227   BMET Recent Labs    05/17/22 1245 05/18/22 0510  NA  --  137  K  --  4.3  CL  --  104  CO2  --  25  GLUCOSE  --  125*  BUN  --  10  CREATININE 0.66 0.67  CALCIUM  --  8.3*   PT/INR No results for input(s): "LABPROT", "INR" in the last 72 hours. ABG No results for input(s): "PHART", "HCO3" in the last 72 hours.  Invalid input(s): "PCO2", "PO2"  Anti-infectives: Anti-infectives (From admission, onward)    Start     Dose/Rate Route Frequency Ordered Stop   05/17/22 0600  ceFAZolin (ANCEF) IVPB 2g/100 mL premix        2 g 200 mL/hr over 30 Minutes Intravenous On call to O.R. 05/17/22 0537 05/17/22 0727       Medications: Scheduled Meds:  acetaminophen   650 mg Oral Q6H   cycloSPORINE  1 drop Both Eyes BID   docusate sodium  100 mg Oral BID   enoxaparin (LOVENOX) injection  40 mg Subcutaneous Q24H   melatonin  10 mg Oral QHS   Ensure Max Protein  11 oz Oral TID   rosuvastatin  10 mg Oral QHS   Continuous Infusions:  lactated ringers 75 mL/hr at 05/17/22 1320   methocarbamol (ROBAXIN) IV     PRN Meds:.famotidine, HYDROmorphone (DILAUDID) injection, methocarbamol (ROBAXIN) IV, ondansetron (ZOFRAN) IV, ondansetron, oxyCODONE, oxyCODONE, prochlorperazine, simethicone  Assessment/Plan: [name] is a 67 year old female s/p small bowel resection (x2) for two small bowel masses on 05/17/22.  Doing well POD1 Passing some flatus Try some soft food today Pain control Increase activity   LOS: 1 day   FEN: Soft, KVO ID: None VTE: Scds, lovenox Foley: None Dispo: Continued care on floor    Felicie Morn, MD  [facility] Use [email or web address] to contact on call provider  Daily Billing: [phone] - post op

## 2022-05-19 NOTE — Progress Notes (Signed)
Mobility Specialist - Progress Note   05/19/22 0939  Mobility  Activity Ambulated independently in hallway  Level of Assistance Independent  Assistive Device None  Distance Ambulated (ft) 500 ft  Activity Response Tolerated well  Mobility Referral Yes  $Mobility charge 1 Mobility   Pt received in bed and agreeable to mobility. No complaints during session. Pt to bed after session with all needs met.    [facility]

## 2022-05-19 NOTE — Progress Notes (Signed)
Mobility Specialist - Progress Note   05/19/22 1248  Mobility  Activity Ambulated independently in hallway  Level of Assistance Independent  Assistive Device None  Distance Ambulated (ft) 600 ft  Activity Response Tolerated well  Mobility Referral Yes  $Mobility charge 1 Mobility   Pt received in bed and agreeable to mobility. No complaints during session. Pt to bed after session with all needs met.    [facility]

## 2022-05-19 NOTE — Progress Notes (Signed)
Progress Note: General Surgery Service   Chief Complaint/Subjective: Passing some flatus, no bowel movements.  Pain okay with oxycodone  Objective: Vital signs in last 24 hours: Temp:  [97.5 F (36.4 C)-98.6 F (37 C)] 97.5 F (36.4 C) (03/20 0626) Pulse Rate:  [79-89] 79 (03/20 0626) Resp:  [16-18] 18 (03/20 0626) BP: (93-100)/(65-71) 100/71 (03/20 0626) SpO2:  [96 %-100 %] 100 % (03/20 0626) Last BM Date : 05/16/22  Intake/Output from previous day: 03/19 0701 - 03/20 0700 In: 1800 [P.O.:1680; I.V.:120] Out: 4875 [Urine:4875] Intake/Output this shift: No intake/output data recorded.  Constitutional: NAD; conversant; no deformities Eyes: Moist conjunctiva; no lid lag; anicteric; PERRL Neck: Trachea midline; no thyromegaly Lungs: Normal respiratory effort; no tactile fremitus CV: RRR; no palpable thrills; no pitting edema GI: Abd incision c/d/I w/ glue; no palpable hepatosplenomegaly MSK: Normal range of motion of extremities; no clubbing/cyanosis Psychiatric: Appropriate affect; alert and oriented x3 Lymphatic: No palpable cervical or axillary lymphadenopathy  Lab Results: CBC  Recent Labs    05/17/22 1245 05/18/22 0510  WBC 12.4* 11.5*  HGB 12.2 10.7*  HCT 36.5 33.3*  PLT 238 227   BMET Recent Labs    05/17/22 1245 05/18/22 0510  NA  --  137  K  --  4.3  CL  --  104  CO2  --  25  GLUCOSE  --  125*  BUN  --  10  CREATININE 0.66 0.67  CALCIUM  --  8.3*   PT/INR No results for input(s): "LABPROT", "INR" in the last 72 hours. ABG No results for input(s): "PHART", "HCO3" in the last 72 hours.  Invalid input(s): "PCO2", "PO2"  Anti-infectives: Anti-infectives (From admission, onward)    Start     Dose/Rate Route Frequency Ordered Stop   05/17/22 0600  ceFAZolin (ANCEF) IVPB 2g/100 mL premix        2 g 200 mL/hr over 30 Minutes Intravenous On call to O.R. 05/17/22 0537 05/17/22 0727       Medications: Scheduled Meds:  acetaminophen  650 mg Oral  Q6H   cycloSPORINE  1 drop Both Eyes BID   docusate sodium  100 mg Oral BID   enoxaparin (LOVENOX) injection  40 mg Subcutaneous Q24H   melatonin  10 mg Oral QHS   Ensure Max Protein  11 oz Oral TID   rosuvastatin  10 mg Oral QHS   Continuous Infusions:  lactated ringers 10 mL/hr at 05/18/22 0849   methocarbamol (ROBAXIN) IV     PRN Meds:.famotidine, HYDROmorphone (DILAUDID) injection, methocarbamol (ROBAXIN) IV, ondansetron (ZOFRAN) IV, ondansetron, oxyCODONE, oxyCODONE, prochlorperazine, simethicone  Assessment/Plan: Ms. Mizer is a 67 year old female s/p small bowel resection (x2) for two small bowel masses on 05/17/22.  Doing well POD2 Passing some flatus Tolerated grits Pain control Increase activity   LOS: 2 days   FEN: Soft, KVO ID: None VTE: Scds, lovenox Foley: None Dispo: Continued care on floor  Home after BM.  Continue soft diet for two weeks then advance as tolerated   Felicie Morn, MD  [facility] Use [email or web address] to contact on call provider  Daily Billing: [phone] - post op

## 2022-05-20 ENCOUNTER — Other Ambulatory Visit: Payer: Self-pay | Admitting: Hematology and Oncology

## 2022-05-20 ENCOUNTER — Encounter: Payer: Self-pay | Admitting: Hematology and Oncology

## 2022-05-20 MED ORDER — METHOCARBAMOL 750 MG PO TABS: 750.0000 mg | ORAL_TABLET | Freq: Four times a day (QID) | ORAL | 0 refills | Status: DC | PRN

## 2022-05-20 NOTE — Plan of Care (Signed)

## 2022-05-20 NOTE — Discharge Summary (Signed)
Patient ID: [name] [id number] 66 y.o. 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   Weight gain 09/09/2013   Encounter for counseling 11/25/2012   Screening for malignant neoplasm of the cervix 11/25/2012   Menopause 10/08/2012   Unspecified vitamin D deficiency 10/08/2012   Hyperlipemia 10/08/2012   Osteoarthritis 06/04/2012     Discharge Diagnoses:  Patient Active Problem List   Diagnosis Date Noted   Small bowel mass 05/17/2022   SBO (small bowel obstruction) ([place]) 03/31/2022   Genetic testing 11/10/2021   Family history of breast cancer 10/27/2021   Family history of colon cancer 10/27/2021   S/P breast reconstruction, bilateral 09/30/2019   S/P mastectomy, bilateral 04/13/2019   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  She underwent open small bowel resection.  The pathology  preliminarily looks like metastatic breast cancer, final results to follow.  She did well in the hospital and was discharged tolerating a diet and passing flatus.  Consults: None  Significant Diagnostic Studies: None  Treatments: surgery: as above  Disposition: Home  Patient Instructions:  Allergies as of 05/20/2022   No Known Allergies      Medication List     TAKE these medications    acetaminophen 500 MG tablet Commonly known as: TYLENOL Take 2 tablets (1,000 mg total) by mouth every 6 (six) hours as needed for moderate pain.   calcium carbonate 1250 (500 Ca) MG tablet Commonly known as: OS-CAL - dosed in mg of elemental calcium Take 1 tablet by mouth 2 (two) times daily with a meal.   cycloSPORINE 0.05 % ophthalmic emulsion Commonly known as: RESTASIS Place 1 drop into both eyes 2 (two) times daily.   Ensure Max Protein Liqd Take 330 mLs (11 oz total) by mouth 3 (three) times daily.   famotidine 40 MG tablet Commonly known as: PEPCID Take 40 mg by mouth daily as needed for heartburn or indigestion.   Melatonin 10 MG Tabs Take 10 mg by mouth at bedtime.   methocarbamol 750 MG tablet Commonly known as: Robaxin-750 Take 1 tablet (750 mg total) by mouth every 6 (six) hours as needed for muscle spasms.   MULTI-VITAMIN DAILY PO Take 1 tablet by mouth daily.   ondansetron 4 MG disintegrating tablet Commonly known as: ZOFRAN-ODT Take 1 tablet (4 mg total) by mouth every 6 (six) hours as needed for nausea.   oxyCODONE-acetaminophen 5-325 MG tablet Commonly known as: Percocet Take 1 tablet by mouth every 4 (four) hours as needed  for severe pain.   PRESERVISION AREDS 2 PO Take 1 tablet by mouth in the morning and at bedtime.   rosuvastatin 10 MG tablet Commonly known as: CRESTOR Take 10 mg by mouth at bedtime.   TUMS PO Take 3-4 tablets by mouth as needed (heartburn).   Vitamin D3 125 MCG (5000 UT) capsule Take 5,000 Units by mouth every Monday.         Activity: no heavy lifting for 4 weeks Diet:  Soft diet for two weeks then advance as tolerated Wound Care: keep wound clean and dry  Follow-up:  With Dr. Thermon Leyland in 4 weeks.  Signed: Nickola Major Ellery Meroney General, Bariatric, & Minimally Invasive Surgery [facility], [place]   05/20/2022, 1:56 PM

## 2022-05-20 NOTE — Progress Notes (Signed)
Reviewed written d/c instructions w pt and all questions answered. She verbalized understanding. D/C via w/c w all belongings in stable condition. 

## 2022-05-20 NOTE — Progress Notes (Signed)
I spoke to Dr Saralyn Pilar from pathology He mentioned that although its not entirely clear,path is suggestive of metastatic breast cancer.  I requested he order prognostics and also send for foundation one. I will see her on 4/4.  [name]

## 2022-05-21 LAB — SURGICAL PATHOLOGY

## 2022-05-26 ENCOUNTER — Encounter (HOSPITAL_COMMUNITY): Payer: Self-pay

## 2022-05-31 ENCOUNTER — Encounter (HOSPITAL_COMMUNITY): Payer: Self-pay | Admitting: Hematology and Oncology

## 2022-06-03 ENCOUNTER — Inpatient Hospital Stay: Payer: PPO | Attending: Hematology and Oncology | Admitting: Hematology and Oncology

## 2022-06-03 ENCOUNTER — Other Ambulatory Visit: Payer: Self-pay

## 2022-06-03 VITALS — BP 118/64 | HR 37 | Temp 97.7°F | Resp 14 | Wt 144.8 lb

## 2022-06-03 DIAGNOSIS — Z171 Estrogen receptor negative status [ER-]: Secondary | ICD-10-CM | POA: Diagnosis not present

## 2022-06-03 DIAGNOSIS — C799 Secondary malignant neoplasm of unspecified site: Secondary | ICD-10-CM | POA: Diagnosis not present

## 2022-06-03 DIAGNOSIS — C784 Secondary malignant neoplasm of small intestine: Secondary | ICD-10-CM | POA: Insufficient documentation

## 2022-06-03 DIAGNOSIS — C50919 Malignant neoplasm of unspecified site of unspecified female breast: Secondary | ICD-10-CM | POA: Diagnosis present

## 2022-06-03 DIAGNOSIS — Z79620 Long term (current) use of immunosuppressive biologic: Secondary | ICD-10-CM | POA: Diagnosis not present

## 2022-06-03 DIAGNOSIS — Z5112 Encounter for antineoplastic immunotherapy: Secondary | ICD-10-CM | POA: Insufficient documentation

## 2022-06-04 ENCOUNTER — Telehealth: Payer: Self-pay | Admitting: Hematology and Oncology

## 2022-06-04 NOTE — Telephone Encounter (Signed)
Left patient a vm regarding upcoming appointments  

## 2022-06-04 NOTE — Progress Notes (Unsigned)
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  It is highly unusual to see a microinvasive breast cancer and predominantly DCIS with negative margins negative lymph nodes to present with metastatic disease.  Also the location of her disease seems to be highly unusual for breast cancer metastasis. Given poorly differentiated carcinoma, I spoke to [name] pathology team who suggested that the carcinoma has not arisen in small bowel, however it was very poorly differentiated and given her history of breast cancer they wondered if this could be related to breast cancer.  The pathologist is unable to assist with primary and suggested that we correlate clinically.  Outpatient PET/CT showed 2 short segments of small bowel with mild wall thickening and increased radiotracer uptake within the left upper quadrant of abdominal and lower pelvis.  Findings may represent known small  bowel metastasis or additional sites of disease  She underwent surgical resection, two small bowel masses, neg margins, once again path with metastatic breast cancer likely, triple neg. Foundation one showed CPS 60%, PIK3CA mutation, no other targets. We today discussed about considering immunotherapy maintenance. She is agreeable. She understands the cancer is considered metastatic. We have discussed about the mechanism of action of immunotherapy, adverse effects of immunotherapy including but not limited to adverse effects such as fatigue, dry skin, hypothyroidism, hyperthyroidism, hepatitis, nephritis, diarrhea, hypophysitis etc. She understands that some of the side effects cna be rarely fatal. She wants to start week of April 22nd.  Thank you for consulting [place] in the care of this patient.  Please do not hesitate to contact us with any additional questions or concerns.  HISTORY OF PRESENTING ILLNESS:  [name] 67 y.o. 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  This is a very pleasant 67 year old postmenopausal female patient who was previously seen by [name] in April 2021 when she had right breast biopsy showing 1.6 cm high-grade DCIS ER/PR negative with a second smaller area of suspicious calcifications could not be safely biopsied status post bilateral mastectomies and right sentinel lymph node sampling.  There was no evidence of malignancy in the left breast.  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  4 right axillary lymph nodes were removed all benign.  She then had breast reconstruction.  Since she did not need antiestrogen therapy, she was not followed in the breast oncology clinic. She first noticed some blood in her stool back in November 2022, followed up with [name] from gastroenterology  and had a colonoscopy which  was unremarkable however Dr. Loreta AveMann recommended further evaluation with imaging.  She then had some CT abdomen which showed no definitive abnormality.  She also started having some abdominal pains which she describes as a spasm happens intermittently started around 2 to 3 months ago but have been more frequent in onset. She was referred to [name] for ongoing abdominal pain.     She had enteroscopy which showed discrete focal segment of abnormal jejunum these findings are concerning for lymphoma or for other infiltrative malignancy.  She then had small bowel mass biopsy.  The enteroscopy showed normal esophagus, stomach and examined duodenum.  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  Histology showed sheets to partially nested neoplastic cells with expression of CAM5.2 and GATA3.  Since she had a history of breast cancer it was thought that this could represent metastatic breast cancer.   She was also taken to the OR on her most recent visit given worsening abdominal pain.  According to the surgeon there was 2 small bowel masses 1 about 20 cm from the ligament of Treitz this was biopsied recently.  There is another 1 approximately 100 cm from the terminal ileum bowel turned back on itself in this area and a band of omentum stuck to the bowel which was divided.  Not sure if the band or the mass was causing obstruction but the bowel proximal to this area was dilated and the bowel distal appeared normal.  I had a conversation with the surgeon and he wondered if the band was problem causing the obstruction.   The biopsied omentum showed benign omentum, negative for malignancy. Cytology is negative.    INTERIM HISTORY  She is here for follow up. She is healing well. No abdominal pain, nausea, vomiting, diarrhea etc. She is recovering as expected.  REVIEW OF SYSTEMS:   Constitutional: Denies fevers, chills or abnormal night sweats Eyes: Denies blurriness of vision, double vision or watery  eyes Ears, nose, mouth, throat, and face: Denies mucositis or sore throat Respiratory: Denies cough, dyspnea or wheezes Cardiovascular: Denies palpitation, chest discomfort or lower extremity swelling Gastrointestinal:  Denies nausea, heartburn or change in bowel habits Skin: Denies abnormal skin rashes Lymphatics: Denies new lymphadenopathy or easy bruising Neurological:Denies numbness, tingling or new weaknesses Behavioral/Psych: Mood is stable, no new changes  All other systems were reviewed with the patient and are negative.   MEDICAL HISTORY:  Past Medical History:  Diagnosis Date   Arthritis    Backache, unspecified    Cancer (HCC)    breast s/p BL mastectomies   Colon polyps    Family history of breast cancer 10/27/2021   Family history of colon cancer 10/27/2021   Hyperlipidemia    Mitral valve prolapse of mother during pregnancy    PONV (postoperative nausea and vomiting)     SURGICAL HISTORY: Past Surgical History:  Procedure Laterality Date   BOWEL RESECTION N/A 05/17/2022   Procedure: SMALL BOWEL RESECTION;  Surgeon: Quentin OreStechschulte, Paul J, MD;  Location: [facility];  Service: General;  Laterality: N/A;   BREAST RECONSTRUCTION WITH PLACEMENT OF TISSUE EXPANDER AND FLEX HD (ACELLULAR HYDRATED DERMIS) Bilateral 04/05/2019   Procedure: BILATERAL BREAST RECONSTRUCTION WITH PLACEMENT OF TISSUE EXPANDER AND FLEX HD (ACELLULAR HYDRATED DERMIS);  Surgeon: Peggye Formillingham, Claire S, DO;  Location: [facility];  Service: Plastics;  Laterality: Bilateral;   ENDOMETRIAL ABLATION  1998   GIVENS CAPSULE STUDY N/A 12/04/2021   Procedure: GIVENS CAPSULE STUDY;  Surgeon:  Caroline Elizabeth, MD;  Location: [facility];  Service: Gastroenterology;  Laterality: N/A;   KNEE SURGERY Right 2007   LAPAROSCOPY N/A 04/14/2022   Procedure: LAPAROSCOPY DIAGNOSTIC, G-TUBE PLACEMENT; OMENTUM BIOPSY; LYSIS OF ADHESIONS;  Surgeon: Quentin Ore, MD;  Location: [facility];  Service: General;   Laterality: N/A;   LIPOSUCTION WITH LIPOFILLING Bilateral 01/30/2020   Procedure: LIPOSUCTION WITH LIPOFILLING;  Surgeon: Peggye Form, DO;  Location: [facility];  Service: Plastics;  Laterality: Bilateral;  90 min, please   MASTECTOMY W/ SENTINEL NODE BIOPSY Bilateral 04/05/2019   Procedure: BILATERAL MASTECTOMIES WITH RIGHT SENTINEL LYMPH NODE BIOPSY;  Surgeon: Caroline Miner, MD;  Location: [facility];  Service: General;  Laterality: Bilateral;   REMOVAL OF BILATERAL TISSUE EXPANDERS WITH PLACEMENT OF BILATERAL BREAST IMPLANTS Bilateral 09/26/2019   Procedure: REMOVAL OF BILATERAL TISSUE EXPANDERS WITH PLACEMENT OF BILATERAL BREAST IMPLANTS;  Surgeon: Peggye Form, DO;  Location: [facility];  Service: Plastics;  Laterality: Bilateral;   RHINOPLASTY  1977   TONSILLECTOMY  1961    SOCIAL HISTORY: Social History   Socioeconomic History   Marital status: Married    Spouse name: Actor   Number of children: 3   Years of education: 16+   Highest education level: Not on file  Occupational History    Employer: [employer]   Occupation: Herbalist  Tobacco Use   Smoking status: Never   Smokeless tobacco: Never  Vaping Use   Vaping Use: Never used  Substance and Sexual Activity   Alcohol use: Not Currently    Comment: 2-3/week   Drug use: No   Sexual activity: Yes    Partners: Male  Other Topics Concern   Not on file  Social History Narrative   Marital Status:  Married Midwife)    Children:  G3 P3 Daughters (Lauren, Valma Cava, Derby)    Pets:  Poodle (Mollie); Cats Billey Gosling, Delorise Shiner)    Living Situation: Lives with spouse.  Her daughters live in [place].    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[employer])    Education:  Best boy)    Tobacco Use/Exposure:  None    Alcohol Use:  Occasional   Drug Use:  None   Diet:  Regular   Exercise:   Walking   Hobbies:  Gardening, Film/video editor              Social Determinants of Health   Financial Resource Strain: Not on file  Food Insecurity: No Food Insecurity (04/13/2022)   Hunger Vital Sign    Worried About Running Out of Food in the Last Year: Never true    Ran Out of Food in the Last Year: Never true  Transportation Needs: No Transportation Needs (04/13/2022)   PRAPARE - Administrator, Civil Service (Medical): No    Lack of Transportation (Non-Medical): No  Physical Activity: Not on file  Stress: Not on file  Social Connections: Not on file  Intimate Partner Violence: Not At Risk (04/13/2022)   Humiliation, Afraid, Rape, and Kick questionnaire    Fear of Current or Ex-Partner: No    Emotionally Abused: No    Physically Abused: No    Sexually Abused: No    FAMILY HISTORY: Family History  Problem Relation Age of Onset   Arthritis Mother    Colon cancer Mother 26   Melanoma Father  dx 22s; nose   Lung cancer Paternal Uncle        dx after 71; smoking hx   Breast cancer Maternal Grandmother        dx 63s   Hyperlipidemia Paternal Grandmother    Cancer Paternal Grandfather        unknown type; d. before 43   Colon cancer Other        MGM's mother    ALLERGIES:  has No Known Allergies.  MEDICATIONS:  Current Outpatient Medications  Medication Sig Dispense Refill   acetaminophen (TYLENOL) 500 MG tablet Take 2 tablets (1,000 mg total) by mouth every 6 (six) hours as needed for moderate pain. 30 tablet 0   calcium carbonate (OS-CAL - DOSED IN MG OF ELEMENTAL CALCIUM) 1250 (500 Ca) MG tablet Take 1 tablet by mouth 2 (two) times daily with a meal.     Calcium Carbonate Antacid (TUMS PO) Take 3-4 tablets by mouth as needed (heartburn).     Cholecalciferol (VITAMIN D3) 125 MCG (5000 UT) CAPS Take 5,000 Units by mouth every Monday.     cycloSPORINE (RESTASIS) 0.05 % ophthalmic emulsion Place 1 drop into both eyes 2 (two) times daily.     Ensure Max  Protein (ENSURE MAX PROTEIN) LIQD Take 330 mLs (11 oz total) by mouth 3 (three) times daily.     famotidine (PEPCID) 40 MG tablet Take 40 mg by mouth daily as needed for heartburn or indigestion.     Melatonin 10 MG TABS Take 10 mg by mouth at bedtime.     methocarbamol (ROBAXIN-750) 750 MG tablet Take 1 tablet (750 mg total) by mouth every 6 (six) hours as needed for muscle spasms. 30 tablet 0   Multiple Vitamin (MULTI-VITAMIN DAILY PO) Take 1 tablet by mouth daily.     Multiple Vitamins-Minerals (PRESERVISION AREDS 2 PO) Take 1 tablet by mouth in the morning and at bedtime.     ondansetron (ZOFRAN-ODT) 4 MG disintegrating tablet Take 1 tablet (4 mg total) by mouth every 6 (six) hours as needed for nausea. 20 tablet 0   oxyCODONE-acetaminophen (PERCOCET) 5-325 MG tablet Take 1 tablet by mouth every 4 (four) hours as needed for severe pain. 20 tablet 0   rosuvastatin (CRESTOR) 10 MG tablet Take 10 mg by mouth at bedtime.     No current facility-administered medications for this visit.   PHYSICAL EXAMINATION: ECOG PERFORMANCE STATUS: 0 - Asymptomatic  Vitals:   06/03/22 1530  BP: 118/64  Pulse: (!) 37  Resp: 14  Temp: 97.7 F (36.5 C)  SpO2: 96%    Filed Weights   06/03/22 1530  Weight: 144 lb 12.8 oz (65.7 kg)     GENERAL:alert, no distress and comfortable Rest of PE Deferred in lieu of counseling  LABORATORY DATA:  I have reviewed the data as listed Lab Results  Component Value Date   WBC 11.5 (H) 05/18/2022   HGB 10.7 (L) 05/18/2022   HCT 33.3 (L) 05/18/2022   MCV 99.1 05/18/2022   PLT 227 05/18/2022     Chemistry      Component Value Date/Time   NA 137 05/18/2022 0510   K 4.3 05/18/2022 0510   CL 104 05/18/2022 0510   CO2 25 05/18/2022 0510   BUN 10 05/18/2022 0510   CREATININE 0.67 05/18/2022 0510   CREATININE 0.91 01/17/2019 1301   CREATININE 0.66 08/28/2012 1400      Component Value Date/Time   CALCIUM 8.3 (L) 05/18/2022 0510   ALKPHOS 62  04/13/2022  1016   AST 25 04/13/2022 1016   AST 22 01/17/2019 1301   ALT 19 04/13/2022 1016   ALT 17 01/17/2019 1301   BILITOT 1.1 04/13/2022 1016   BILITOT 0.9 01/17/2019 1301       RADIOGRAPHIC STUDIES: I have personally reviewed the radiological images as listed and agreed with the findings in the report. No results found.  All questions were answered. The patient knows to call the clinic with any problems, questions or concerns.  Total time spent: 30 minutes    Caroline Moulds, MD 06/04/2022 11:36 PM

## 2022-06-06 ENCOUNTER — Encounter: Payer: Self-pay | Admitting: Hematology and Oncology

## 2022-06-06 DIAGNOSIS — C799 Secondary malignant neoplasm of unspecified site: Secondary | ICD-10-CM | POA: Insufficient documentation

## 2022-06-06 MED ORDER — ONDANSETRON HCL 8 MG PO TABS
8.0000 mg | ORAL_TABLET | Freq: Three times a day (TID) | ORAL | 1 refills | Status: DC | PRN
Start: 2022-06-06 — End: 2023-11-24

## 2022-06-06 MED ORDER — PROCHLORPERAZINE MALEATE 10 MG PO TABS
10.0000 mg | ORAL_TABLET | Freq: Four times a day (QID) | ORAL | 1 refills | Status: DC | PRN
Start: 2022-06-06 — End: 2023-11-24

## 2022-06-06 MED ORDER — LIDOCAINE-PRILOCAINE 2.5-2.5 % EX CREA
TOPICAL_CREAM | CUTANEOUS | 3 refills | Status: DC
Start: 2022-06-06 — End: 2023-10-03

## 2022-06-06 NOTE — Addendum Note (Signed)
Addended by: Jackquline Denmark on: 06/06/2022 02:10 PM   Modules accepted: Orders

## 2022-06-07 ENCOUNTER — Encounter: Payer: PPO | Admitting: Surgical

## 2022-06-07 ENCOUNTER — Telehealth: Payer: Self-pay

## 2022-06-07 NOTE — Telephone Encounter (Signed)
Notified Patient by voice mail of prior authorization approval for Lidocaine-Prilocaine 2.5% Cream. Medication is approved through 09/05/2022.

## 2022-06-08 ENCOUNTER — Other Ambulatory Visit: Payer: Self-pay

## 2022-06-09 ENCOUNTER — Other Ambulatory Visit: Payer: Self-pay

## 2022-06-09 ENCOUNTER — Telehealth: Payer: Self-pay | Admitting: Hematology and Oncology

## 2022-06-14 ENCOUNTER — Encounter: Payer: Self-pay | Admitting: Surgical

## 2022-06-14 ENCOUNTER — Ambulatory Visit (INDEPENDENT_AMBULATORY_CARE_PROVIDER_SITE_OTHER): Payer: Self-pay | Admitting: Surgical

## 2022-06-14 VITALS — BP 135/81

## 2022-06-14 DIAGNOSIS — Z411 Encounter for cosmetic surgery: Secondary | ICD-10-CM

## 2022-06-14 NOTE — Progress Notes (Signed)
Botulinum Toxin Procedure Note  Procedure: Cosmetic botulinum toxin  Pre-operative Diagnosis: Dynamic rhytides  Post-operative Diagnosis: Same  Complications:  None  Brief history: The patient desires botulinum toxin injection.  She is aware of the risks including bleeding, damage to deeper structures, asymmetry, brow ptosis, eyelid ptosis, bruising. The patient understands and wishes to proceed.  Procedure: The area was prepped with alcohol and dried with a clean gauze.  Using a clean technique the botulinum toxin was diluted with 2.5 mL of bacteriostatic saline per 100 unit vial which resulted in 4 units per 0.1 mL.  Subsequently the mixture was injected in the glabellar, lateral canthal lines, forehead area with preservation of the temporal branch to the lateral eyebrow. A total of 36 Units of botulinum toxin was used. The forehead and glabellar area was injected with care to inject intramuscular only while holding pressure on the supratrochlear vessels in each area during each injection on either side of the medial corrugators. The injection proceeded vertically superiorly to the medial 2/3 of the frontalis muscle and superior 2/3 of the lateral frontalis, again with preservation of the frontal branch.  No complications were noted. Light pressure was held for 5 minutes. She was instructed explicitly in post-operative care.  Botox LOT:  [id number] EXP:  2026/04

## 2022-06-15 ENCOUNTER — Inpatient Hospital Stay: Payer: PPO | Admitting: Pharmacist

## 2022-06-15 ENCOUNTER — Other Ambulatory Visit: Payer: Self-pay

## 2022-06-15 ENCOUNTER — Inpatient Hospital Stay: Payer: PPO

## 2022-06-15 DIAGNOSIS — Z5112 Encounter for antineoplastic immunotherapy: Secondary | ICD-10-CM | POA: Diagnosis not present

## 2022-06-15 DIAGNOSIS — C50911 Malignant neoplasm of unspecified site of right female breast: Secondary | ICD-10-CM

## 2022-06-15 NOTE — Progress Notes (Signed)
Pharmacist Chemotherapy Monitoring - Initial Assessment    Anticipated start date: 06/22/22   The following has been reviewed per standard work regarding the patient's treatment regimen: The patient's diagnosis, treatment plan and drug doses, and organ/hematologic function Lab orders and baseline tests specific to treatment regimen  The treatment plan start date, drug sequencing, and pre-medications Prior authorization status  Patient's documented medication list, including drug-drug interaction screen and prescriptions for anti-emetics and supportive care specific to the treatment regimen The drug concentrations, fluid compatibility, administration routes, and timing of the medications to be used The patient's access for treatment and lifetime cumulative dose history, if applicable  The patient's medication allergies and previous infusion related reactions, if applicable   Changes made to treatment plan:  N/A  Follow up needed:  N/A   [name], RPH, 06/15/2022  12:47 PM

## 2022-06-15 NOTE — Progress Notes (Signed)
[facility]       Telephone: [phone]?Fax: [phone]   Oncology Clinical Pharmacist Practitioner Initial Assessment  [name] is a 67 y.o. female with a diagnosis of breast cancer. They were contacted today via in-person visit.  Indication/Regimen Pembrolizumab [facility]) is being used appropriately for treatment of breast cancer by Dr. Rachel Moulds.      Wt Readings from Last 1 Encounters:  06/03/22 144 lb 12.8 oz (65.7 kg)    Estimated body surface area is 1.76 meters squared as calculated from the following:   Height as of 05/17/22:  (1.702 m).   Weight as of 06/03/22: 144 lb 12.8 oz (65.7 kg).  The dosing regimen is every 21 days  200 mg IV on Day 1  Dose Modifications None   Allergies No Known Allergies  Vitals = No vitals or labs were done today for this chemotherapy education visit   Contraindications Contraindications were reviewed? Yes Contraindications to therapy were identified? No   Safety Precautions The following safety precautions for the use of pembrolizumab were reviewed:  Fever: reviewed the importance of having a thermometer and the Centers for Disease Control and Prevention (CDC) definition of fever which is 100.62F (38C) or higher. Patient should call 24/7 triage at [phone] if experiencing a fever or any other symptoms Fatigue Muscle or joint pain or weakness Rash or itchy skin Diarrhea Cough or shortness of breath Lung (respiratory tract) infection Changes in liver function Changes in kidney function Changes in electrolyte levels Changes in hormone levels (weight/mood changes, headaches, fatigue, sweating, hypertension, tachycardia) Colonitis (diarrhea or severe abdominal pain) Hepatitis (jaundice, severe nausea and vomiting, easy bruising or bleeding) Pneumonitis (new or worsening cough, shortness of breath, chest pain, difficulty breathing or wheezing) Severe skin reactions resulting in  flu-like symptoms and painful rashes that spread or blister Vision changes (eye pain, swelling, redness, changes in vision such as flashes of light, blurred vision, floaters in our field of vision, light hurting your eyes) Infusion reactions Handling body fluids and waste Intimacy, sexual activity, contraception, and fertility  Medication Reconciliation Current Outpatient Medications  Medication Sig Dispense Refill   calcium carbonate (OS-CAL - DOSED IN MG OF ELEMENTAL CALCIUM) 1250 (500 Ca) MG tablet Take 1 tablet by mouth 2 (two) times daily with a meal.     cycloSPORINE (RESTASIS) 0.05 % ophthalmic emulsion Place 1 drop into both eyes 2 (two) times daily.     Ensure Max Protein (ENSURE MAX PROTEIN) LIQD Take 330 mLs (11 oz total) by mouth 3 (three) times daily.     Melatonin 10 MG TABS Take 10 mg by mouth at bedtime.     Multiple Vitamin (MULTI-VITAMIN DAILY PO) Take 1 tablet by mouth daily.     Multiple Vitamins-Minerals (PRESERVISION AREDS 2 PO) Take 1 tablet by mouth in the morning and at bedtime.     rosuvastatin (CRESTOR) 10 MG tablet Take 10 mg by mouth at bedtime.     acetaminophen (TYLENOL) 500 MG tablet Take 2 tablets (1,000 mg total) by mouth every 6 (six) hours as needed for moderate pain. (Patient not taking: Reported on 06/15/2022) 30 tablet 0   Calcium Carbonate Antacid (TUMS PO) Take 3-4 tablets by mouth as needed (heartburn). (Patient not taking: Reported on 06/15/2022)     famotidine (PEPCID) 40 MG tablet Take 40 mg by mouth daily as needed for heartburn or indigestion. (Patient not taking: Reported on 06/15/2022)     lidocaine-prilocaine (EMLA) cream Apply  to affected area once (Patient not taking: Reported on 06/15/2022) 30 g 3   ondansetron (ZOFRAN) 8 MG tablet Take 1 tablet (8 mg total) by mouth every 8 (eight) hours as needed for nausea or vomiting. (Patient not taking: Reported on 06/15/2022) 30 tablet 1   prochlorperazine (COMPAZINE) 10 MG tablet Take 1 tablet (10 mg total)  by mouth every 6 (six) hours as needed for nausea or vomiting. (Patient not taking: Reported on 06/15/2022) 30 tablet 1   No current facility-administered medications for this visit.   Medication reconciliation is based on the patient's most recent medication list in the electronic medical record (EMR) including herbal products and OTC medications.   The patient's medication list was reviewed today with the patient? Yes   Drug-drug interactions (DDIs) DDIs were evaluated? Yes Significant DDIs identified? No   Drug-Food Interactions Drug-food interactions were evaluated? Yes Drug-food interactions identified? No   Follow-up Plan  Treatment start date: 06/22/22 Port placement date: 07/12/22 -- first cycle peripherally. Port flush with labs for cycle 2 and beyond. Treatment plan updated We reviewed the prescriptions, premedications (none), and treatment regimen with the patient. Possible side effects of the treatment regimen were reviewed and management strategies were discussed.  Can use loperamide as needed for diarrhea  Clinical pharmacy will assist Dr. Rachel Moulds and [name] on an as needed basis going forward  [name] participated in the discussion, expressed understanding, and voiced agreement with the above plan. All questions were answered to her satisfaction. The patient was advised to contact the clinic at [phone] with any questions or concerns prior to her return visit.   I spent 60 minutes assessing the patient.  Deandrea Rion A. Odetta Pink, PharmD, BCOP, CPP  Anselm Lis, RPH-CPP, 06/15/2022 10:53 AM  **Disclaimer: This note was dictated with voice recognition software. Similar sounding words can inadvertently be transcribed and this note may contain transcription errors which may not have been corrected upon publication of note.**

## 2022-06-16 ENCOUNTER — Telehealth: Payer: Self-pay | Admitting: Hematology and Oncology

## 2022-06-16 NOTE — Telephone Encounter (Signed)
Reached out to patient to schedule pre IB, patient aware of date and time of appointment.

## 2022-06-17 ENCOUNTER — Telehealth: Payer: Self-pay | Admitting: *Deleted

## 2022-06-17 NOTE — Telephone Encounter (Signed)
Informed pt would like to have a port a cath placed for use with chemotherapy for recurrent breast cancer.  Noted surgeon is Dr Carolynne Edouard- this message will be forwarded to him for scheduling.

## 2022-06-22 ENCOUNTER — Inpatient Hospital Stay: Payer: PPO

## 2022-06-22 ENCOUNTER — Other Ambulatory Visit: Payer: Self-pay

## 2022-06-22 ENCOUNTER — Encounter: Payer: Self-pay | Admitting: Hematology and Oncology

## 2022-06-22 ENCOUNTER — Encounter: Payer: Self-pay | Admitting: Adult Health

## 2022-06-22 ENCOUNTER — Inpatient Hospital Stay (HOSPITAL_BASED_OUTPATIENT_CLINIC_OR_DEPARTMENT_OTHER): Payer: PPO | Admitting: Adult Health

## 2022-06-22 VITALS — BP 120/84 | HR 80 | Temp 96.8°F | Resp 20 | Ht 67.0 in | Wt 145.6 lb

## 2022-06-22 VITALS — BP 114/82 | HR 72 | Resp 19

## 2022-06-22 DIAGNOSIS — C799 Secondary malignant neoplasm of unspecified site: Secondary | ICD-10-CM | POA: Diagnosis not present

## 2022-06-22 DIAGNOSIS — Z5112 Encounter for antineoplastic immunotherapy: Secondary | ICD-10-CM | POA: Diagnosis not present

## 2022-06-22 LAB — CMP (CANCER CENTER ONLY)
ALT: 16 U/L (ref 0–44)
AST: 23 U/L (ref 15–41)
Albumin: 4.6 g/dL (ref 3.5–5.0)
Alkaline Phosphatase: 78 U/L (ref 38–126)
Anion gap: 8 (ref 5–15)
BUN: 21 mg/dL (ref 8–23)
CO2: 27 mmol/L (ref 22–32)
Calcium: 10 mg/dL (ref 8.9–10.3)
Chloride: 105 mmol/L (ref 98–111)
Creatinine: 0.8 mg/dL (ref 0.44–1.00)
GFR, Estimated: >60 mL/min (ref >60)
Glucose, Bld: 94 mg/dL (ref 70–99)
Potassium: 4.3 mmol/L (ref 3.5–5.1)
Sodium: 140 mmol/L (ref 135–145)
Total Bilirubin: 0.6 mg/dL (ref 0.3–1.2)
Total Protein: 7.2 g/dL (ref 6.5–8.1)

## 2022-06-22 LAB — CBC WITH DIFFERENTIAL (CANCER CENTER ONLY)
Abs Immature Granulocytes: 0.01 10*3/uL (ref 0.00–0.07)
Basophils Absolute: 0 10*3/uL (ref 0.0–0.1)
Basophils Relative: 1 %
Eosinophils Absolute: 0.1 10*3/uL (ref 0.0–0.5)
Eosinophils Relative: 1 %
HCT: 39.1 % (ref 36.0–46.0)
Hemoglobin: 13.3 g/dL (ref 12.0–15.0)
Immature Granulocytes: 0 %
Lymphocytes Relative: 28 %
Lymphs Abs: 1.6 10*3/uL (ref 0.7–4.0)
MCH: 32.4 pg (ref 26.0–34.0)
MCHC: 34 g/dL (ref 30.0–36.0)
MCV: 95.1 fL (ref 80.0–100.0)
Monocytes Absolute: 0.5 10*3/uL (ref 0.1–1.0)
Monocytes Relative: 9 %
Neutro Abs: 3.4 10*3/uL (ref 1.7–7.7)
Neutrophils Relative %: 61 %
Platelet Count: 236 10*3/uL (ref 150–400)
RBC: 4.11 MIL/uL (ref 3.87–5.11)
RDW: 12.7 % (ref 11.5–15.5)
WBC Count: 5.7 10*3/uL (ref 4.0–10.5)
nRBC: 0 % (ref 0.0–0.2)

## 2022-06-22 LAB — TSH: TSH: 1.481 u[IU]/mL (ref 0.350–4.500)

## 2022-06-22 MED ORDER — SODIUM CHLORIDE 0.9 % IV SOLN: Freq: Once | INTRAVENOUS | Status: AC

## 2022-06-22 MED ORDER — SODIUM CHLORIDE 0.9 % IV SOLN
200.0000 mg | Freq: Once | INTRAVENOUS | Status: AC
  Administered 2022-06-22: 200 mg via INTRAVENOUS
  Filled 2022-06-22: qty 200

## 2022-06-22 NOTE — Patient Instructions (Signed)
[place] [facility]  Discharge Instructions: Thank you for choosing [facility] to provide your oncology and hematology care.   If you have a lab appointment with the Cancer Center, please go directly to the Cancer Center and check in at the registration area.   Wear comfortable clothing and clothing appropriate for easy access to any Portacath or PICC line.   We strive to give you quality time with your provider. You may need to reschedule your appointment if you arrive late (15 or more minutes).  Arriving late affects you and other patients whose appointments are after yours.  Also, if you miss three or more appointments without notifying the office, you may be dismissed from the clinic at the provider's discretion.      For prescription refill requests, have your pharmacy contact our office and allow 72 hours for refills to be completed.    Today you received the following chemotherapy and/or immunotherapy agent: Keytruda      To help prevent nausea and vomiting after your treatment, we encourage you to take your nausea medication as directed.  BELOW ARE SYMPTOMS THAT SHOULD BE REPORTED IMMEDIATELY: *FEVER GREATER THAN 100.4 F (38 C) OR HIGHER *CHILLS OR SWEATING *NAUSEA AND VOMITING THAT IS NOT CONTROLLED WITH YOUR NAUSEA MEDICATION *UNUSUAL SHORTNESS OF BREATH *UNUSUAL BRUISING OR BLEEDING *URINARY PROBLEMS (pain or burning when urinating, or frequent urination) *BOWEL PROBLEMS (unusual diarrhea, constipation, pain near the anus) TENDERNESS IN MOUTH AND THROAT WITH OR WITHOUT PRESENCE OF ULCERS (sore throat, sores in mouth, or a toothache) UNUSUAL RASH, SWELLING OR PAIN  UNUSUAL VAGINAL DISCHARGE OR ITCHING   Items with * indicate a potential emergency and should be followed up as soon as possible or go to the Emergency Department if any problems should occur.  Please show the CHEMOTHERAPY ALERT CARD or IMMUNOTHERAPY ALERT CARD at  check-in to the Emergency Department and triage nurse.  Should you have questions after your visit or need to cancel or reschedule your appointment, please contact [facility] [facility]  Dept: [phone]  and follow the prompts.  Office hours are 8:00 a.m. to 4:30 p.m. Monday - Friday. Please note that voicemails left after 4:00 p.m. may not be returned until the following business day.  We are closed weekends and major holidays. You have access to a nurse at all times for urgent questions. Please call the main number to the clinic Dept: [phone] and follow the prompts.   For any non-urgent questions, you may also contact your provider using MyChart. We now offer e-Visits for anyone 58 and older to request care online for non-urgent symptoms. For details visit [email or web address].   Also download the MyChart app! Go to the app store, search "MyChart", open the app, select , and log in with your MyChart username and password.  Pembrolizumab Injection What is this medication? PEMBROLIZUMAB (PEM broe LIZ ue mab) treats some types of cancer. It works by helping your immune system slow or stop the spread of cancer cells. It is a monoclonal antibody. This medicine may be used for other purposes; ask your health care provider or pharmacist if you have questions. COMMON BRAND NAME(S): Keytruda What should I tell my care team before I take this medication? They need to know if you have any of these conditions: Allogeneic stem cell transplant (uses someone else's stem cells) Autoimmune diseases, such as Crohn disease, ulcerative colitis, lupus History of chest radiation Nervous  system problems, such as Guillain-Barre syndrome, myasthenia gravis Organ transplant An unusual or allergic reaction to pembrolizumab, other medications, foods, dyes, or preservatives Pregnant or trying to get pregnant Breast-feeding How should I use this medication? This  medication is injected into a vein. It is given by your care team in a hospital or clinic setting. A special MedGuide will be given to you before each treatment. Be sure to read this information carefully each time. Talk to your care team about the use of this medication in children. While it may be prescribed for children as young as 6 months for selected conditions, precautions do apply. Overdosage: If you think you have taken too much of this medicine contact a poison control center or emergency room at once. NOTE: This medicine is only for you. Do not share this medicine with others. What if I miss a dose? Keep appointments for follow-up doses. It is important not to miss your dose. Call your care team if you are unable to keep an appointment. What may interact with this medication? Interactions have not been studied. This list may not describe all possible interactions. Give your health care provider a list of all the medicines, herbs, non-prescription drugs, or dietary supplements you use. Also tell them if you smoke, drink alcohol, or use illegal drugs. Some items may interact with your medicine. What should I watch for while using this medication? Your condition will be monitored carefully while you are receiving this medication. You may need blood work while taking this medication. This medication may cause serious skin reactions. They can happen weeks to months after starting the medication. Contact your care team right away if you notice fevers or flu-like symptoms with a rash. The rash may be red or purple and then turn into blisters or peeling of the skin. You may also notice a red rash with swelling of the face, lips, or lymph nodes in your neck or under your arms. Tell your care team right away if you have any change in your eyesight. Talk to your care team if you may be pregnant. Serious birth defects can occur if you take this medication during pregnancy and for 4 months after the last  dose. You will need a negative pregnancy test before starting this medication. Contraception is recommended while taking this medication and for 4 months after the last dose. Your care team can help you find the option that works for you. Do not breastfeed while taking this medication and for 4 months after the last dose. What side effects may I notice from receiving this medication? Side effects that you should report to your care team as soon as possible: Allergic reactions--skin rash, itching, hives, swelling of the face, lips, tongue, or throat Dry cough, shortness of breath or trouble breathing Eye pain, redness, irritation, or discharge with blurry or decreased vision Heart muscle inflammation--unusual weakness or fatigue, shortness of breath, chest pain, fast or irregular heartbeat, dizziness, swelling of the ankles, feet, or hands Hormone gland problems--headache, sensitivity to light, unusual weakness or fatigue, dizziness, fast or irregular heartbeat, increased sensitivity to cold or heat, excessive sweating, constipation, hair loss, increased thirst or amount of urine, tremors or shaking, irritability Infusion reactions--chest pain, shortness of breath or trouble breathing, feeling faint or lightheaded Kidney injury (glomerulonephritis)--decrease in the amount of urine, red or dark brown urine, foamy or bubbly urine, swelling of the ankles, hands, or feet Liver injury--right upper belly pain, loss of appetite, nausea, light-colored stool, dark yellow or   brown urine, yellowing skin or eyes, unusual weakness or fatigue Pain, tingling, or numbness in the hands or feet, muscle weakness, change in vision, confusion or trouble speaking, loss of balance or coordination, trouble walking, seizures Rash, fever, and swollen lymph nodes Redness, blistering, peeling, or loosening of the skin, including inside the mouth Sudden or severe stomach pain, bloody diarrhea, fever, nausea, vomiting Side effects  that usually do not require medical attention (report to your care team if they continue or are bothersome): Bone, joint, or muscle pain Diarrhea Fatigue Loss of appetite Nausea Skin rash This list may not describe all possible side effects. Call your doctor for medical advice about side effects. You may report side effects to FDA at 1-800-FDA-1088. Where should I keep my medication? This medication is given in a hospital or clinic. It will not be stored at home. NOTE: This sheet is a summary. It may not cover all possible information. If you have questions about this medicine, talk to your doctor, pharmacist, or health care provider.  2023 Elsevier/Gold Standard (2021-06-30 00:00:00)

## 2022-06-22 NOTE — Progress Notes (Signed)
Cancer Center Cancer Follow up:    Caroline Inch, MD [address] [place] [address]   DIAGNOSIS: Metastatic carcinoma  SUMMARY OF ONCOLOGIC HISTORY: Oncology History  Metastatic carcinoma  10/2021 Genetic Testing   Negative. 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  Genes tested: AIP, ALK, APC, ATM, AXIN2, BAP1, BARD1, BLM, BMPR1A, BRCA1, BRCA2, BRIP1, CDC73, CDH1, CDK4, CDKN1B, CDKN2A, CHEK2, CTNNA1, DICER1, FANCC, FH, FLCN, GALNT12, KIF1B, LZTR1, MAX, MEN1, MET, MLH1, MSH2, MSH3, MSH6, MUTYH, NBN, NF1, NF2, NTHL1, PALB2, PHOX2B, PMS2, POT1, PRKAR1A, PTCH1, PTEN, RAD51C, RAD51D, RB1, RECQL, RET, SDHA, SDHAF2, SDHB, SDHC, SDHD, SMAD4, SMARCA4, SMARCB1, SMARCE1, STK11, SUFU, TMEM127, TP53, TSC1, TSC2, VHL and XRCC2 (sequencing and deletion/duplication); EGFR, EGLN1, HOXB13, KIT, MITF, PDGFRA, POLD1, and POLE (sequencing only); EPCAM and GREM1 (deletion/duplication only).    04/08/2022 Initial Biopsy   [facility] Enteroscopy and small bowel mass biopsy: The enteroscopy showed normal esophagus, stomach and examined duodenum.  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  Histology showed sheets to partially nested neoplastic cells with expression of CAM5.2 and GATA3, negative for CDX2, could represent metastatic breast cancer.  ER negative, PR negative, HER2 2+, FISH negative.    04/14/2022 Initial Biopsy   Omental biopsy: benign omental tissue   05/02/2022 PET scan   IMPRESSION: 1. There are 2 short segments of small bowel with mild wall thickening and increased radiotracer uptake within the left upper quadrant of the abdomen and lower pelvis. Findings may represent known small-bowel metastasis and/or additional sites of disease. 2. No tracer avid nodal metastasis or solid organ metastasis. 3.  Aortic Atherosclerosis (ICD10-I70.0).     Electronically Signed    By: Signa Kell M.D.   On: 05/02/2022 09:49   05/17/2022 Surgery   Small bowel resection: Poorly differentiated carcinoma, 3.7cm; margins negative and 5.3cm margins negative, 1LN negative for cancer, likely breast primary   05/17/2022 Pathology Results   Foundation 1: PD-L1 CPS 60, PI3KCA mutation   06/22/2022 -  Chemotherapy   Patient is on Treatment Plan : BREAST Pembrolizumab (200) q21d x 24 months       CURRENT THERAPY: Pembrolizumab  INTERVAL HISTORY: [name] 67 y.o. female returns for f/u and evaluation prior to receiving her first treatment with Pembrolizumab.  Her daughter is accompanying her.  Her duaghter lives in [place], but came back to [place] for her wedding on 07/03/2022.  [name] has been looking forward to her daughters wedding and she will undergo port placement after her daughters wedding.    We reviewed her initial symptoms of her cancer, which were related to her small bowel obstruction Those resolved after her surgery.  She feels well and has no concerns today.     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  MEDICAL HISTORY: Past Medical History:  Diagnosis Date   Arthritis    Backache, unspecified    Cancer    breast s/p BL mastectomies   Colon polyps    Family history of breast cancer  10/27/2021   Family history of colon cancer 10/27/2021   Hyperlipidemia    Mitral valve prolapse of mother during pregnancy    PONV (postoperative nausea and vomiting)     SURGICAL HISTORY: Past Surgical History:  Procedure Laterality Date   BOWEL RESECTION N/A 05/17/2022   Procedure: SMALL BOWEL RESECTION;  Surgeon: Quentin Ore, MD;  Location: [facility];  Service: General;  Laterality: N/A;   BREAST RECONSTRUCTION WITH PLACEMENT OF TISSUE EXPANDER AND FLEX HD (ACELLULAR HYDRATED DERMIS) Bilateral 04/05/2019   Procedure: BILATERAL BREAST RECONSTRUCTION WITH PLACEMENT OF TISSUE EXPANDER AND FLEX HD (ACELLULAR HYDRATED DERMIS);  Surgeon: Peggye Form, DO;  Location: [facility];  Service: Plastics;  Laterality: Bilateral;   ENDOMETRIAL ABLATION  1998   GIVENS CAPSULE STUDY N/A 12/04/2021   Procedure: GIVENS CAPSULE STUDY;  Surgeon: Charna Elizabeth, MD;  Location: [facility];  Service: Gastroenterology;  Laterality: N/A;   KNEE SURGERY Right 2007   LAPAROSCOPY N/A 04/14/2022   Procedure: LAPAROSCOPY DIAGNOSTIC, G-TUBE PLACEMENT; OMENTUM BIOPSY; LYSIS OF ADHESIONS;  Surgeon: Quentin Ore, MD;  Location: [facility];  Service: General;  Laterality: N/A;   LIPOSUCTION WITH LIPOFILLING Bilateral 01/30/2020   Procedure: LIPOSUCTION WITH LIPOFILLING;  Surgeon: Peggye Form, DO;  Location: [facility];  Service: Plastics;  Laterality: Bilateral;  90 min, please   MASTECTOMY W/ SENTINEL NODE BIOPSY Bilateral 04/05/2019   Procedure: BILATERAL MASTECTOMIES WITH RIGHT SENTINEL LYMPH NODE BIOPSY;  Surgeon: Griselda Miner, MD;  Location: [facility];  Service: General;  Laterality: Bilateral;   REMOVAL OF BILATERAL TISSUE EXPANDERS WITH PLACEMENT OF BILATERAL BREAST IMPLANTS Bilateral 09/26/2019   Procedure: REMOVAL OF BILATERAL TISSUE EXPANDERS WITH PLACEMENT OF BILATERAL BREAST IMPLANTS;  Surgeon: Peggye Form, DO;  Location: [facility];  Service: Plastics;  Laterality: Bilateral;   RHINOPLASTY  1977   TONSILLECTOMY  1961    SOCIAL HISTORY: Social History   Socioeconomic History   Marital status: Married    Spouse name: Actor   Number of children: 3   Years of education: 16+   Highest education level: Not on file  Occupational History    Employer: [employer]   Occupation: Herbalist  Tobacco Use   Smoking status: Never   Smokeless tobacco: Never  Vaping Use   Vaping Use: Never used  Substance and Sexual Activity   Alcohol use: Not Currently    Comment: 2-3/week   Drug use: No   Sexual activity: Yes    Partners: Male  Other Topics Concern   Not on file  Social History Narrative   Marital Status:  Married Midwife)    Children:  G3 P3 Daughters (Lauren, Valma Cava, Lostant)    Pets:  Poodle (Mollie); Cats Billey Gosling, Delorise Shiner)    Living Situation: Lives with spouse.  Her daughters live in [place].    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[employer])    Education:  Best boy)    Tobacco Use/Exposure:  None    Alcohol Use:  Occasional   Drug Use:  None   Diet:  Regular   Exercise:  Walking   Hobbies:  Social research officer, government, Film/video editor  Social Determinants of Health   Financial Resource Strain: Not on file  Food Insecurity: No Food Insecurity (04/13/2022)   Hunger Vital Sign    Worried About Running Out of Food in the Last Year: Never true    Ran Out of Food in the Last Year: Never true  Transportation Needs: No Transportation Needs (04/13/2022)   PRAPARE - Administrator, Civil Service (Medical): No    Lack of Transportation (Non-Medical): No  Physical Activity: Not on file  Stress: Not on file  Social Connections: Not on file  Intimate Partner Violence: Not At Risk (04/13/2022)   Humiliation, Afraid, Rape, and Kick questionnaire    Fear of Current or Ex-Partner: No    Emotionally Abused:  No    Physically Abused: No    Sexually Abused: No    FAMILY HISTORY: Family History  Problem Relation Age of Onset   Arthritis Mother    Colon cancer Mother 54   Melanoma Father        dx 41s; nose   Lung cancer Paternal Uncle        dx after 21; smoking hx   Breast cancer Maternal Grandmother        dx 12s   Hyperlipidemia Paternal Grandmother    Cancer Paternal Grandfather        unknown type; d. before 71   Colon cancer Other        MGM's mother    Review of Systems  Constitutional:  Negative for appetite change, chills, fatigue, fever and unexpected weight change.  HENT:   Negative for hearing loss, lump/mass and trouble swallowing.   Eyes:  Negative for eye problems and icterus.  Respiratory:  Negative for chest tightness, cough and shortness of breath.   Cardiovascular:  Negative for chest pain, leg swelling and palpitations.  Gastrointestinal:  Negative for abdominal distention, abdominal pain, constipation, diarrhea, nausea and vomiting.  Endocrine: Negative for hot flashes.  Genitourinary:  Negative for difficulty urinating.   Musculoskeletal:  Negative for arthralgias.  Skin:  Negative for itching and rash.  Neurological:  Negative for dizziness, extremity weakness, headaches and numbness.  Hematological:  Negative for adenopathy. Does not bruise/bleed easily.  Psychiatric/Behavioral:  Negative for depression. The patient is not nervous/anxious.       PHYSICAL EXAMINATION    Vitals:   06/22/22 1338  BP: 120/84  Pulse: 80  Resp: 20  Temp: (!) 96.8 F (36 C)    Physical Exam Constitutional:      General: She is not in acute distress.    Appearance: Normal appearance. She is not toxic-appearing.  HENT:     Head: Normocephalic and atraumatic.  Eyes:     General: No scleral icterus. Cardiovascular:     Rate and Rhythm: Normal rate and regular rhythm.     Pulses: Normal pulses.     Heart sounds: Normal heart sounds.  Pulmonary:     Effort:  Pulmonary effort is normal.     Breath sounds: Normal breath sounds.  Abdominal:     General: Abdomen is flat. Bowel sounds are normal. There is no distension.     Palpations: Abdomen is soft.     Tenderness: There is no abdominal tenderness.  Musculoskeletal:        General: No swelling.     Cervical back: Neck supple.  Lymphadenopathy:     Cervical: No cervical adenopathy.  Skin:    General: Skin is warm and dry.  Findings: No rash.  Neurological:     General: No focal deficit present.     Mental Status: She is alert.  Psychiatric:        Mood and Affect: Mood normal.        Behavior: Behavior normal.     LABORATORY DATA:  CBC    Component Value Date/Time   WBC 5.7 06/22/2022 1330   WBC 11.5 (H) 05/18/2022 0510   RBC 4.11 06/22/2022 1330   HGB 13.3 06/22/2022 1330   HCT 39.1 06/22/2022 1330   PLT 236 06/22/2022 1330   MCV 95.1 06/22/2022 1330   MCH 32.4 06/22/2022 1330   MCHC 34.0 06/22/2022 1330   RDW 12.7 06/22/2022 1330   LYMPHSABS 1.6 06/22/2022 1330   MONOABS 0.5 06/22/2022 1330   EOSABS 0.1 06/22/2022 1330   BASOSABS 0.0 06/22/2022 1330    CMP     Component Value Date/Time   NA 140 06/22/2022 1330   K 4.3 06/22/2022 1330   CL 105 06/22/2022 1330   CO2 27 06/22/2022 1330   GLUCOSE 94 06/22/2022 1330   BUN 21 06/22/2022 1330   CREATININE 0.80 06/22/2022 1330   CREATININE 0.66 08/28/2012 1400   CALCIUM 10.0 06/22/2022 1330   PROT 7.2 06/22/2022 1330   ALBUMIN 4.6 06/22/2022 1330   AST 23 06/22/2022 1330   ALT 16 06/22/2022 1330   ALKPHOS 78 06/22/2022 1330   BILITOT 0.6 06/22/2022 1330   GFRNONAA >60 06/22/2022 1330   GFRNONAA >89 08/28/2012 1400   GFRAA >60 01/17/2019 1301   GFRAA >89 08/28/2012 1400      ASSESSMENT and THERAPY PLAN:   Metastatic carcinoma [name] is a 67 year old woman with 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    We reviewed risks and  benefits, along with her labs which were normal.  She is willing to proceed.  She will undergo port placement and we will see her in 3 weeks for labs, f/u, and her next treatment.  She knows to call for any questions or concerns between now and her next visit.     All questions were answered. The patient knows to call the clinic with any problems, questions or concerns. We can certainly see the patient much sooner if necessary.  Total encounter time:30 minutes*in face-to-face visit time, chart review, lab review, care coordination, order entry, and documentation of the encounter time.    Lillard Anes, NP 06/23/22 1:08 PM Medical Oncology and H[facility] [address] [place], [place] [address] Tel. [phone]    Fax. [phone]  *Total Encounter Time as defined by the Centers for Medicare and Medicaid Services includes, in addition to the face-to-face time of a patient visit (documented in the note above) non-face-to-face time: obtaining and reviewing outside history, ordering and reviewing medications, tests or procedures, care coordination (communications with other health care professionals or caregivers) and documentation in the medical record.

## 2022-06-23 ENCOUNTER — Telehealth: Payer: Self-pay | Admitting: Nutrition

## 2022-06-23 ENCOUNTER — Encounter: Payer: Self-pay | Admitting: Hematology and Oncology

## 2022-06-23 ENCOUNTER — Telehealth: Payer: Self-pay

## 2022-06-23 ENCOUNTER — Other Ambulatory Visit: Payer: Self-pay

## 2022-06-23 LAB — T4: T4, Total: 7.3 ug/dL (ref 4.5–12.0)

## 2022-06-23 NOTE — Telephone Encounter (Signed)
-----   Message from Julieanne Manson, RN sent at 06/22/2022  4:27 PM EDT ----- Regarding: First time/ Keytruda/ Dr Al Pimple pt Pt had first time Keytruda today, tolerated well.  Thanks!

## 2022-06-23 NOTE — Assessment & Plan Note (Signed)
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    We reviewed risks and benefits, along with her labs which were normal.  She is willing to proceed.  She will undergo port placement and we will see her in 3 weeks for labs, f/u, and her next treatment.  She knows to call for any questions or concerns between now and her next visit.

## 2022-06-23 NOTE — Telephone Encounter (Signed)
[name] states that she is doing fine. She is eating, drinking, and urinating well. She knows to call the office at [phone] if she has any questions or concerns.

## 2022-06-23 NOTE — Telephone Encounter (Signed)
Patient left voicemail to move appointment , reached out to patient reschedule .

## 2022-06-24 ENCOUNTER — Telehealth: Payer: Self-pay | Admitting: Adult Health

## 2022-06-24 ENCOUNTER — Inpatient Hospital Stay: Payer: PPO | Admitting: Nutrition

## 2022-06-24 LAB — CA 125: Cancer Antigen (CA) 125: 14.3 U/mL (ref 0.0–38.1)

## 2022-06-24 NOTE — Telephone Encounter (Signed)
Scheduled appointments per WQ. Left voicemail. 

## 2022-06-28 ENCOUNTER — Inpatient Hospital Stay: Payer: PPO

## 2022-06-28 NOTE — Progress Notes (Signed)
Nutrition  Patient did not show up for scheduled nutrition appointment.    Lakota Markgraf B. Arnet Hofferber, RD, LDN Registered Dietitian [phone]  

## 2022-06-30 ENCOUNTER — Other Ambulatory Visit: Payer: Self-pay

## 2022-07-09 ENCOUNTER — Other Ambulatory Visit: Payer: Self-pay | Admitting: Radiology

## 2022-07-12 ENCOUNTER — Encounter (HOSPITAL_COMMUNITY): Payer: Self-pay

## 2022-07-12 ENCOUNTER — Ambulatory Visit (HOSPITAL_COMMUNITY)
Admission: RE | Admit: 2022-07-12 | Discharge: 2022-07-12 | Disposition: A | Discharge status: Home / Self Care | Payer: PPO | Source: Ambulatory Visit | Attending: Hematology and Oncology | Admitting: Hematology and Oncology

## 2022-07-12 ENCOUNTER — Telehealth: Payer: Self-pay | Admitting: *Deleted

## 2022-07-12 DIAGNOSIS — Z9013 Acquired absence of bilateral breasts and nipples: Secondary | ICD-10-CM | POA: Diagnosis not present

## 2022-07-12 DIAGNOSIS — Z800 Family history of malignant neoplasm of digestive organs: Secondary | ICD-10-CM | POA: Diagnosis not present

## 2022-07-12 DIAGNOSIS — Z806 Family history of leukemia: Secondary | ICD-10-CM | POA: Diagnosis not present

## 2022-07-12 DIAGNOSIS — C799 Secondary malignant neoplasm of unspecified site: Secondary | ICD-10-CM | POA: Diagnosis present

## 2022-07-12 DIAGNOSIS — Z808 Family history of malignant neoplasm of other organs or systems: Secondary | ICD-10-CM | POA: Diagnosis not present

## 2022-07-12 DIAGNOSIS — C50919 Malignant neoplasm of unspecified site of unspecified female breast: Secondary | ICD-10-CM | POA: Diagnosis not present

## 2022-07-12 HISTORY — PX: IR IMAGING GUIDED PORT INSERTION: IMG5740

## 2022-07-12 MED ORDER — FENTANYL CITRATE (PF) 100 MCG/2ML IJ SOLN
INTRAMUSCULAR | Status: AC
  Filled 2022-07-12: qty 4

## 2022-07-12 MED ORDER — LIDOCAINE-EPINEPHRINE 1 %-1:100000 IJ SOLN
INTRAMUSCULAR | Status: AC
  Filled 2022-07-12: qty 1

## 2022-07-12 MED ORDER — CEFAZOLIN SODIUM-DEXTROSE 2-4 GM/100ML-% IV SOLN
INTRAVENOUS | Status: AC
  Filled 2022-07-12: qty 100

## 2022-07-12 MED ORDER — CEFAZOLIN SODIUM-DEXTROSE 2-4 GM/100ML-% IV SOLN
2.0000 g | INTRAVENOUS | Status: AC
  Administered 2022-07-12: 2 g via INTRAVENOUS

## 2022-07-12 MED ORDER — FENTANYL CITRATE (PF) 100 MCG/2ML IJ SOLN
INTRAMUSCULAR | Status: AC | PRN
  Administered 2022-07-12 (×2): 50 ug via INTRAVENOUS

## 2022-07-12 MED ORDER — NALOXONE HCL 0.4 MG/ML IJ SOLN
INTRAMUSCULAR | Status: AC
  Filled 2022-07-12: qty 1

## 2022-07-12 MED ORDER — HEPARIN SOD (PORK) LOCK FLUSH 100 UNIT/ML IV SOLN
500.0000 [IU] | Freq: Once | INTRAVENOUS | Status: AC
  Administered 2022-07-12: 500 [IU] via INTRAVENOUS

## 2022-07-12 MED ORDER — SODIUM CHLORIDE 0.9 % IV SOLN: INTRAVENOUS | Status: DC

## 2022-07-12 MED ORDER — FLUMAZENIL 0.5 MG/5ML IV SOLN
INTRAVENOUS | Status: AC
  Filled 2022-07-12: qty 5

## 2022-07-12 MED ORDER — MIDAZOLAM HCL 2 MG/2ML IJ SOLN
INTRAMUSCULAR | Status: AC
  Filled 2022-07-12: qty 4

## 2022-07-12 MED ORDER — LIDOCAINE-EPINEPHRINE 1 %-1:100000 IJ SOLN
20.0000 mL | Freq: Once | INTRAMUSCULAR | Status: AC
  Administered 2022-07-12: 20 mL via INTRADERMAL

## 2022-07-12 MED ORDER — MIDAZOLAM HCL 2 MG/2ML IJ SOLN
INTRAMUSCULAR | Status: AC | PRN
  Administered 2022-07-12 (×3): 1 mg via INTRAVENOUS

## 2022-07-12 MED ORDER — HEPARIN SOD (PORK) LOCK FLUSH 100 UNIT/ML IV SOLN
INTRAVENOUS | Status: AC
  Filled 2022-07-12: qty 5

## 2022-07-12 NOTE — Procedures (Signed)
Vascular and Interventional Radiology Procedure Note  Patient: [name] DOB: 05-Feb-1956 Medical Record Number: [id number] Note Date/Time: 07/12/22 2:31 PM   Performing Physician: Roanna Banning, MD Assistant(s): None  Diagnosis: Breast cancer, metastatic. Rx Immunotherapy  Procedure: PORT PLACEMENT  Anesthesia: Conscious Sedation Complications: None Estimated Blood Loss: Minimal  Findings:  Successful right-sided port placement, with the tip of the catheter in the proximal right atrium.  Plan: Catheter ready for use.  See detailed procedure note with images in PACS. The patient tolerated the procedure well without incident or complication and was returned to Recovery in stable condition.    Roanna Banning, MD [facility]   Pager. [phone] Clinic. [phone]

## 2022-07-12 NOTE — Discharge Instructions (Addendum)

## 2022-07-12 NOTE — Consult Note (Signed)
Chief Complaint: Patient was seen in consultation today for port a cath placement  Referring Physician(s): Iruku,Praveena  Supervising Physician: Roanna Banning  Patient Status: [facility] - Out-pt  History of Present Illness: [name] is a 67 y.o. 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 She is scheduled today for port a cath placement to assist with immunotherapy.   Past Medical History:  Diagnosis Date   Arthritis    Backache, unspecified    Cancer (HCC)    breast s/p BL mastectomies   Colon polyps    Family history of breast cancer 10/27/2021   Family history of colon cancer 10/27/2021   Hyperlipidemia    Mitral valve prolapse of mother during pregnancy    PONV (postoperative nausea and vomiting)     Past Surgical History:  Procedure Laterality Date   BOWEL RESECTION N/A 05/17/2022   Procedure: SMALL BOWEL RESECTION;  Surgeon: Quentin Ore, MD;  Location: [facility];  Service: General;  Laterality: N/A;   BREAST RECONSTRUCTION WITH PLACEMENT OF TISSUE EXPANDER AND FLEX HD (ACELLULAR HYDRATED DERMIS) Bilateral 04/05/2019   Procedure: BILATERAL BREAST RECONSTRUCTION WITH PLACEMENT OF TISSUE EXPANDER AND FLEX HD (ACELLULAR HYDRATED DERMIS);  Surgeon: Peggye Form, DO;  Location: [facility];  Service: Plastics;  Laterality: Bilateral;   ENDOMETRIAL ABLATION  1998   GIVENS CAPSULE STUDY N/A 12/04/2021   Procedure: GIVENS CAPSULE STUDY;  Surgeon: Charna Elizabeth, MD;  Location: [facility];  Service: Gastroenterology;  Laterality: N/A;   KNEE SURGERY Right 2007   LAPAROSCOPY N/A  04/14/2022   Procedure: LAPAROSCOPY DIAGNOSTIC, G-TUBE PLACEMENT; OMENTUM BIOPSY; LYSIS OF ADHESIONS;  Surgeon: Quentin Ore, MD;  Location: [facility];  Service: General;  Laterality: N/A;   LIPOSUCTION WITH LIPOFILLING Bilateral 01/30/2020   Procedure: LIPOSUCTION WITH LIPOFILLING;  Surgeon: Peggye Form, DO;  Location: [facility];  Service: Plastics;  Laterality: Bilateral;  90 min, please   MASTECTOMY W/ SENTINEL NODE BIOPSY Bilateral 04/05/2019   Procedure: BILATERAL MASTECTOMIES WITH RIGHT SENTINEL LYMPH NODE BIOPSY;  Surgeon: Griselda Miner, MD;  Location: [facility];  Service: General;  Laterality: Bilateral;   REMOVAL OF BILATERAL TISSUE EXPANDERS WITH PLACEMENT OF BILATERAL BREAST IMPLANTS Bilateral 09/26/2019   Procedure: REMOVAL OF BILATERAL TISSUE EXPANDERS WITH PLACEMENT OF BILATERAL BREAST IMPLANTS;  Surgeon: Peggye Form, DO;  Location:  SURGERY CENTER;  Service: Plastics;  Laterality: Bilateral;   RHINOPLASTY  1977   TONSILLECTOMY  1961    Allergies: Patient has no known allergies.  Medications: Prior to Admission medications   Medication Sig Start Date End Date Taking? Authorizing Provider  acetaminophen (TYLENOL) 500 MG tablet Take 2 tablets (1,000 mg total) by mouth every 6 (six) hours as needed for moderate pain. Patient not taking: Reported on 06/15/2022 04/16/22   Rodolph Bong, MD  calcium carbonate (OS-CAL - DOSED IN MG OF ELEMENTAL CALCIUM) 1250 (500 Ca) MG tablet Take 1 tablet by mouth 2 (two) times daily with a meal.    [provider]  Calcium Carbonate Antacid (TUMS PO) Take 3-4 tablets by mouth as needed (heartburn). Patient not taking: Reported on 06/15/2022    [provider]  cycloSPORINE (RESTASIS) 0.05 % ophthalmic emulsion Place 1 drop into both eyes 2 (two) times daily.    [provider]  Ensure Max Protein (ENSURE MAX PROTEIN) LIQD Take 330 mLs (11 oz total) by mouth 3  (three) times daily. 04/16/22   Rodolph Bong, MD  famotidine (PEPCID) 40 MG tablet Take 40 mg by mouth daily as needed for heartburn or indigestion. Patient not taking: Reported on 06/15/2022    [provider]  lidocaine-prilocaine (EMLA) cream Apply to affected area once Patient not taking: Reported on 06/15/2022 06/06/22   Rachel Moulds, MD  Melatonin 10 MG TABS Take 10 mg by mouth at bedtime.    [provider]  Multiple Vitamin (MULTI-VITAMIN DAILY PO) Take 1 tablet by mouth daily.    [provider]  Multiple Vitamins-Minerals (PRESERVISION AREDS 2 PO) Take 1 tablet by mouth in the morning and at bedtime.    [provider]  ondansetron (ZOFRAN) 8 MG tablet Take 1 tablet (8 mg total) by mouth every 8 (eight) hours as needed for nausea or vomiting. Patient not taking: Reported on 06/15/2022 06/06/22   Rachel Moulds, MD  prochlorperazine (COMPAZINE) 10 MG tablet Take 1 tablet (10 mg total) by mouth every 6 (six) hours as needed for nausea or vomiting. Patient not taking: Reported on 06/15/2022 06/06/22   Rachel Moulds, MD  rosuvastatin (CRESTOR) 10 MG tablet Take 10 mg by mouth at bedtime. 02/19/21   [provider]     Family History  Problem Relation Age of Onset   Arthritis Mother    Colon cancer Mother 60   Melanoma Father        dx 12s; nose   Lung cancer Paternal Uncle        dx after 53; smoking hx   Breast cancer Maternal Grandmother        dx 75s   Hyperlipidemia Paternal Grandmother    Cancer Paternal Grandfather        unknown type; d. before 79   Colon cancer Other        MGM's mother    Social History   Socioeconomic History   Marital status: Married    Spouse name: Actor   Number of children: 3   Years of education: 16+   Highest education level: Not on file  Occupational History    Employer: [employer]   Occupation: Herbalist  Tobacco Use   Smoking status: Never   Smokeless tobacco: Never  Vaping Use    Vaping Use: Never used  Substance and Sexual Activity   Alcohol use: Not Currently    Comment: 2-3/week   Drug use: No   Sexual activity: Yes    Partners: Male  Other Topics Concern   Not on file  Social History Narrative   Marital Status:  Married Midwife)    Children:  G3 P3 Daughters (Lauren, Valma Cava, [place])    Pets:  Poodle (Mollie); Cats Billey Gosling, Delorise Shiner)    Living Situation: Lives with spouse.  Her daughters live in [place].    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[employer])    Education:  Best boy)    Tobacco Use/Exposure:  None    Alcohol Use:  Occasional   Drug Use:  None   Diet:  Regular   Exercise:  Walking   Hobbies:  Social research officer, government, Film/video editor  Social Determinants of Health   Financial Resource Strain: Not on file  Food Insecurity: No Food Insecurity (04/13/2022)   Hunger Vital Sign    Worried About Running Out of Food in the Last Year: Never true    Ran Out of Food in the Last Year: Never true  Transportation Needs: No Transportation Needs (04/13/2022)   PRAPARE - Administrator, Civil Service (Medical): No    Lack of Transportation (Non-Medical): No  Physical Activity: Not on file  Stress: Not on file  Social Connections: Not on file      Review of Systems denies fever,HA,CP,dyspnea, cough, abd pain ,back pain,N/V or bleeding.   Vital Signs: Vitals:   07/12/22 1200  BP: (!) 130/96  Pulse: (!) 57  Resp: 20  Temp: 99.1 F (37.3 C)      Code Status: FULL CODE   Physical Exam: awake/alert; chest- CTA bilat; heart-nl rate, ectopy noted; abd soft,+BS,NT; small sl open wound left abd region from prior drain site; no LE edema  Imaging: No results found.  Labs:  CBC: Recent Labs    05/14/22 1105 05/17/22 1245 05/18/22 0510 06/22/22 1330  WBC 8.0 12.4* 11.5* 5.7  HGB 13.0 12.2 10.7* 13.3  HCT 38.9 36.5 33.3* 39.1  PLT 272 238 227  236    COAGS: No results for input(s): "INR", "APTT" in the last 8760 hours.  BMP: Recent Labs    04/15/22 0412 05/14/22 1105 05/17/22 1245 05/18/22 0510 06/22/22 1330  NA 140 136  --  137 140  K 3.8 3.6  --  4.3 4.3  CL 105 100  --  104 105  CO2 25 27  --  25 27  GLUCOSE 112* 104*  --  125* 94  BUN 5* 17  --  10 21  CALCIUM 8.6* 9.1  --  8.3* 10.0  CREATININE 0.63 0.76 0.66 0.67 0.80  GFRNONAA >60 >60 >60 >60 >60    LIVER FUNCTION TESTS: Recent Labs    03/31/22 2018 04/13/22 1016 06/22/22 1330  BILITOT 0.6 1.1 0.6  AST 29 25 23   ALT 19 19 16   ALKPHOS 63 62 78  PROT 7.9 6.9 7.2  ALBUMIN 4.9 4.0 4.6    TUMOR MARKERS: Recent Labs    05/03/22 1055  CEA 1.36    Assessment and Plan: 67 yo female with 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  All of the patient's questions were answered, patient is agreeable to proceed. Consent signed and in chart.    Thank you for this interesting consult.  I greatly enjoyed meeting Ozie Medvedev and look forward to participating in their care.  A copy of this report was sent to the requesting provider on this date.  Electronically Signed: D. Jeananne Rama, PA-C 07/12/2022, 9:25 AM   I spent a total of  25 minutes   in face to face in clinical consultation, greater than 50% of which was counseling/coordinating care for port a cath placement

## 2022-07-13 ENCOUNTER — Encounter: Payer: Self-pay | Admitting: Hematology and Oncology

## 2022-07-13 ENCOUNTER — Inpatient Hospital Stay: Payer: PPO | Attending: Hematology and Oncology

## 2022-07-13 ENCOUNTER — Other Ambulatory Visit: Payer: Self-pay

## 2022-07-13 ENCOUNTER — Inpatient Hospital Stay: Payer: PPO

## 2022-07-13 ENCOUNTER — Inpatient Hospital Stay (HOSPITAL_BASED_OUTPATIENT_CLINIC_OR_DEPARTMENT_OTHER): Payer: PPO | Admitting: Adult Health

## 2022-07-13 ENCOUNTER — Inpatient Hospital Stay: Payer: PPO | Admitting: Dietician

## 2022-07-13 ENCOUNTER — Encounter: Payer: Self-pay | Admitting: Adult Health

## 2022-07-13 VITALS — BP 145/71 | HR 76 | Temp 97.7°F | Resp 15 | Wt 147.9 lb

## 2022-07-13 VITALS — BP 115/84 | HR 76 | Temp 97.7°F | Resp 16

## 2022-07-13 DIAGNOSIS — Z5112 Encounter for antineoplastic immunotherapy: Secondary | ICD-10-CM | POA: Diagnosis not present

## 2022-07-13 DIAGNOSIS — C50919 Malignant neoplasm of unspecified site of unspecified female breast: Secondary | ICD-10-CM | POA: Insufficient documentation

## 2022-07-13 DIAGNOSIS — C784 Secondary malignant neoplasm of small intestine: Secondary | ICD-10-CM | POA: Diagnosis present

## 2022-07-13 DIAGNOSIS — Z171 Estrogen receptor negative status [ER-]: Secondary | ICD-10-CM | POA: Diagnosis not present

## 2022-07-13 DIAGNOSIS — C799 Secondary malignant neoplasm of unspecified site: Secondary | ICD-10-CM

## 2022-07-13 DIAGNOSIS — Z95828 Presence of other vascular implants and grafts: Secondary | ICD-10-CM

## 2022-07-13 LAB — CBC WITH DIFFERENTIAL (CANCER CENTER ONLY)
Abs Immature Granulocytes: 0.02 10*3/uL (ref 0.00–0.07)
Basophils Absolute: 0 10*3/uL (ref 0.0–0.1)
Basophils Relative: 1 %
Eosinophils Absolute: 0.1 10*3/uL (ref 0.0–0.5)
Eosinophils Relative: 1 %
HCT: 38.3 % (ref 36.0–46.0)
Hemoglobin: 13.1 g/dL (ref 12.0–15.0)
Immature Granulocytes: 0 %
Lymphocytes Relative: 29 %
Lymphs Abs: 1.9 10*3/uL (ref 0.7–4.0)
MCH: 32.6 pg (ref 26.0–34.0)
MCHC: 34.2 g/dL (ref 30.0–36.0)
MCV: 95.3 fL (ref 80.0–100.0)
Monocytes Absolute: 0.6 10*3/uL (ref 0.1–1.0)
Monocytes Relative: 9 %
Neutro Abs: 3.9 10*3/uL (ref 1.7–7.7)
Neutrophils Relative %: 60 %
Platelet Count: 215 10*3/uL (ref 150–400)
RBC: 4.02 MIL/uL (ref 3.87–5.11)
RDW: 12.4 % (ref 11.5–15.5)
WBC Count: 6.5 10*3/uL (ref 4.0–10.5)
nRBC: 0 % (ref 0.0–0.2)

## 2022-07-13 LAB — CMP (CANCER CENTER ONLY)
ALT: 19 U/L (ref 0–44)
AST: 21 U/L (ref 15–41)
Albumin: 4.3 g/dL (ref 3.5–5.0)
Alkaline Phosphatase: 68 U/L (ref 38–126)
Anion gap: 5 (ref 5–15)
BUN: 14 mg/dL (ref 8–23)
CO2: 28 mmol/L (ref 22–32)
Calcium: 9.1 mg/dL (ref 8.9–10.3)
Chloride: 106 mmol/L (ref 98–111)
Creatinine: 0.75 mg/dL (ref 0.44–1.00)
GFR, Estimated: >60 mL/min (ref >60)
Glucose, Bld: 90 mg/dL (ref 70–99)
Potassium: 4.2 mmol/L (ref 3.5–5.1)
Sodium: 139 mmol/L (ref 135–145)
Total Bilirubin: 0.5 mg/dL (ref 0.3–1.2)
Total Protein: 6.6 g/dL (ref 6.5–8.1)

## 2022-07-13 MED ORDER — SODIUM CHLORIDE 0.9 % IV SOLN: Freq: Once | INTRAVENOUS | Status: AC

## 2022-07-13 MED ORDER — SODIUM CHLORIDE 0.9% FLUSH
10.0000 mL | INTRAVENOUS | Status: DC | PRN
  Administered 2022-07-13: 10 mL

## 2022-07-13 MED ORDER — HEPARIN SOD (PORK) LOCK FLUSH 100 UNIT/ML IV SOLN
500.0000 [IU] | Freq: Once | INTRAVENOUS | Status: AC | PRN
  Administered 2022-07-13: 500 [IU]

## 2022-07-13 MED ORDER — SODIUM CHLORIDE 0.9% FLUSH
10.0000 mL | Freq: Once | INTRAVENOUS | Status: AC
  Administered 2022-07-13: 10 mL

## 2022-07-13 MED ORDER — SODIUM CHLORIDE 0.9 % IV SOLN
200.0000 mg | Freq: Once | INTRAVENOUS | Status: AC
  Administered 2022-07-13: 200 mg via INTRAVENOUS
  Filled 2022-07-13: qty 200

## 2022-07-13 NOTE — Patient Instructions (Signed)
[facility]  Discharge Instructions: Thank you for choosing [facility] to provide your oncology and hematology care.   If you have a lab appointment with the Cancer Center, please go directly to the Cancer Center and check in at the registration area.   Wear comfortable clothing and clothing appropriate for easy access to any Portacath or PICC line.   We strive to give you quality time with your provider. You may need to reschedule your appointment if you arrive late (15 or more minutes).  Arriving late affects you and other patients whose appointments are after yours.  Also, if you miss three or more appointments without notifying the office, you may be dismissed from the clinic at the provider's discretion.      For prescription refill requests, have your pharmacy contact our office and allow 72 hours for refills to be completed.    Today you received the following chemotherapy and/or immunotherapy agent: Pembrolizumab (Keytruda)   To help prevent nausea and vomiting after your treatment, we encourage you to take your nausea medication as directed.  BELOW ARE SYMPTOMS THAT SHOULD BE REPORTED IMMEDIATELY: *FEVER GREATER THAN 100.4 F (38 C) OR HIGHER *CHILLS OR SWEATING *NAUSEA AND VOMITING THAT IS NOT CONTROLLED WITH YOUR NAUSEA MEDICATION *UNUSUAL SHORTNESS OF BREATH *UNUSUAL BRUISING OR BLEEDING *URINARY PROBLEMS (pain or burning when urinating, or frequent urination) *BOWEL PROBLEMS (unusual diarrhea, constipation, pain near the anus) TENDERNESS IN MOUTH AND THROAT WITH OR WITHOUT PRESENCE OF ULCERS (sore throat, sores in mouth, or a toothache) UNUSUAL RASH, SWELLING OR PAIN  UNUSUAL VAGINAL DISCHARGE OR ITCHING   Items with * indicate a potential emergency and should be followed up as soon as possible or go to the Emergency Department if any problems should occur.  Please show the CHEMOTHERAPY ALERT CARD or IMMUNOTHERAPY ALERT  CARD at check-in to the Emergency Department and triage nurse.  Should you have questions after your visit or need to cancel or reschedule your appointment, please contact [facility]  Dept: [phone]  and follow the prompts.  Office hours are 8:00 a.m. to 4:30 p.m. Monday - Friday. Please note that voicemails left after 4:00 p.m. may not be returned until the following business day.  We are closed weekends and major holidays. You have access to a nurse at all times for urgent questions. Please call the main number to the clinic Dept: [phone] and follow the prompts.   For any non-urgent questions, you may also contact your provider using MyChart. We now offer e-Visits for anyone 18 and older to request care online for non-urgent symptoms. For details visit [email or web address].   Also download the MyChart app! Go to the app store, search "MyChart", open the app, select [facility], and log in with your MyChart username and password.  Pembrolizumab Injection What is this medication? PEMBROLIZUMAB (PEM broe LIZ ue mab) treats some types of cancer. It works by helping your immune system slow or stop the spread of cancer cells. It is a monoclonal antibody. This medicine may be used for other purposes; ask your health care provider or pharmacist if you have questions. COMMON BRAND NAME(S): Keytruda What should I tell my care team before I take this medication? They need to know if you have any of these conditions: Allogeneic stem cell transplant (uses someone else's stem cells) Autoimmune diseases, such as Crohn disease, ulcerative colitis, lupus History of chest radiation Nervous system problems,   such as Guillain-Barre syndrome, myasthenia gravis Organ transplant An unusual or allergic reaction to pembrolizumab, other medications, foods, dyes, or preservatives Pregnant or trying to get pregnant Breast-feeding How should I use this medication? This  medication is injected into a vein. It is given by your care team in a hospital or clinic setting. A special MedGuide will be given to you before each treatment. Be sure to read this information carefully each time. Talk to your care team about the use of this medication in children. While it may be prescribed for children as young as 6 months for selected conditions, precautions do apply. Overdosage: If you think you have taken too much of this medicine contact a poison control center or emergency room at once. NOTE: This medicine is only for you. Do not share this medicine with others. What if I miss a dose? Keep appointments for follow-up doses. It is important not to miss your dose. Call your care team if you are unable to keep an appointment. What may interact with this medication? Interactions have not been studied. This list may not describe all possible interactions. Give your health care provider a list of all the medicines, herbs, non-prescription drugs, or dietary supplements you use. Also tell them if you smoke, drink alcohol, or use illegal drugs. Some items may interact with your medicine. What should I watch for while using this medication? Your condition will be monitored carefully while you are receiving this medication. You may need blood work while taking this medication. This medication may cause serious skin reactions. They can happen weeks to months after starting the medication. Contact your care team right away if you notice fevers or flu-like symptoms with a rash. The rash may be red or purple and then turn into blisters or peeling of the skin. You may also notice a red rash with swelling of the face, lips, or lymph nodes in your neck or under your arms. Tell your care team right away if you have any change in your eyesight. Talk to your care team if you may be pregnant. Serious birth defects can occur if you take this medication during pregnancy and for 4 months after the last  dose. You will need a negative pregnancy test before starting this medication. Contraception is recommended while taking this medication and for 4 months after the last dose. Your care team can help you find the option that works for you. Do not breastfeed while taking this medication and for 4 months after the last dose. What side effects may I notice from receiving this medication? Side effects that you should report to your care team as soon as possible: Allergic reactions--skin rash, itching, hives, swelling of the face, lips, tongue, or throat Dry cough, shortness of breath or trouble breathing Eye pain, redness, irritation, or discharge with blurry or decreased vision Heart muscle inflammation--unusual weakness or fatigue, shortness of breath, chest pain, fast or irregular heartbeat, dizziness, swelling of the ankles, feet, or hands Hormone gland problems--headache, sensitivity to light, unusual weakness or fatigue, dizziness, fast or irregular heartbeat, increased sensitivity to cold or heat, excessive sweating, constipation, hair loss, increased thirst or amount of urine, tremors or shaking, irritability Infusion reactions--chest pain, shortness of breath or trouble breathing, feeling faint or lightheaded Kidney injury (glomerulonephritis)--decrease in the amount of urine, red or dark brown urine, foamy or bubbly urine, swelling of the ankles, hands, or feet Liver injury--right upper belly pain, loss of appetite, nausea, light-colored stool, dark yellow or brown urine,   yellowing skin or eyes, unusual weakness or fatigue Pain, tingling, or numbness in the hands or feet, muscle weakness, change in vision, confusion or trouble speaking, loss of balance or coordination, trouble walking, seizures Rash, fever, and swollen lymph nodes Redness, blistering, peeling, or loosening of the skin, including inside the mouth Sudden or severe stomach pain, bloody diarrhea, fever, nausea, vomiting Side effects  that usually do not require medical attention (report to your care team if they continue or are bothersome): Bone, joint, or muscle pain Diarrhea Fatigue Loss of appetite Nausea Skin rash This list may not describe all possible side effects. Call your doctor for medical advice about side effects. You may report side effects to FDA at 1-800-FDA-1088. Where should I keep my medication? This medication is given in a hospital or clinic. It will not be stored at home. NOTE: This sheet is a summary. It may not cover all possible information. If you have questions about this medicine, talk to your doctor, pharmacist, or health care provider.  2023 Elsevier/Gold Standard (2021-06-30 00:00:00)   

## 2022-07-13 NOTE — Assessment & Plan Note (Signed)
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    [name] is doing quite well today.  She is tolerating the Keytruda well and will proceed with this again today.  I reviewed her labs with her in detail which were all completely normal.  She will return every 3 weeks for labs, follow-up, and her next infusion.

## 2022-07-13 NOTE — Progress Notes (Signed)
Pt. new port a cath did not have a gauze under the transparent dressing so when the dressing was discontinued after her treatment the Dermabond glue peeled off. The incision site remained closed. Using sterile technique, port a cath site was cleaned with port a cath kit and steri strips applied. Site covered with Telfa gauze and a 4 x 4 gauze with Hyperfix tape. Pt. instructed to leave dressing on for 24 hours then remove gauze dressing only and allow steri strips to peel off naturally. Pt. also instructed to seek assistance immediately for any signs of infection. Pt. states she understands.

## 2022-07-13 NOTE — Progress Notes (Signed)
Called pt to introduce myself as her Dance movement psychotherapist and to discuss the [employer].  Pt is about to retire so her income will drastically change and she doesn't know what her new income will be yet so I requested for the registration staff give her my card to contact me once she knows what her new income will be to see if she qualifies for the grant.  She also stated she missed her nutrition appointment so I gave her Britta Mccreedy Neff's number to reschedule that appointment.

## 2022-07-13 NOTE — Progress Notes (Signed)
[facility] Cancer Follow up:    Caroline Inch, MD [address] [place] [address]   DIAGNOSIS:  Cancer Staging  Ductal carcinoma in situ (DCIS) of right breast Staging form: Breast, AJCC 8th Edition - Clinical stage from 01/17/2019: Stage 0 (cTis (DCIS), cN0, cM0, ER-, PR-) - Unsigned Stage prefix: Initial diagnosis Nuclear grade: G3   SUMMARY OF ONCOLOGIC HISTORY: Oncology History  Metastatic carcinoma (HCC)  10/2021 Genetic Testing   Negative. 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  Genes tested: AIP, ALK, APC, ATM, AXIN2, BAP1, BARD1, BLM, BMPR1A, BRCA1, BRCA2, BRIP1, CDC73, CDH1, CDK4, CDKN1B, CDKN2A, CHEK2, CTNNA1, DICER1, FANCC, FH, FLCN, GALNT12, KIF1B, LZTR1, MAX, MEN1, MET, MLH1, MSH2, MSH3, MSH6, MUTYH, NBN, NF1, NF2, NTHL1, PALB2, PHOX2B, PMS2, POT1, PRKAR1A, PTCH1, PTEN, RAD51C, RAD51D, RB1, RECQL, RET, SDHA, SDHAF2, SDHB, SDHC, SDHD, SMAD4, SMARCA4, SMARCB1, SMARCE1, STK11, SUFU, TMEM127, TP53, TSC1, TSC2, VHL and XRCC2 (sequencing and deletion/duplication); EGFR, EGLN1, HOXB13, KIT, MITF, PDGFRA, POLD1, and POLE (sequencing only); EPCAM and GREM1 (deletion/duplication only).    04/08/2022 Initial Biopsy   [facility] Enteroscopy and small bowel mass biopsy: The enteroscopy showed normal esophagus, stomach and examined duodenum.  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  Histology showed sheets to partially nested neoplastic cells with expression of CAM5.2 and GATA3, negative for CDX2, could represent metastatic breast cancer.  ER negative, PR negative, HER2 2+, FISH negative.    04/14/2022 Initial Biopsy   Omental biopsy: benign omental tissue   05/02/2022 PET scan   IMPRESSION: 1. There are 2 short segments of small bowel with mild wall thickening and increased radiotracer uptake within the left upper quadrant of the abdomen and lower  pelvis. Findings may represent known small-bowel metastasis and/or additional sites of disease. 2. No tracer avid nodal metastasis or solid organ metastasis. 3.  Aortic Atherosclerosis (ICD10-I70.0).     Electronically Signed   By: Signa Kell M.D.   On: 05/02/2022 09:49   05/17/2022 Surgery   Small bowel resection: Poorly differentiated carcinoma, 3.7cm; margins negative and 5.3cm margins negative, 1LN negative for cancer, likely breast primary   05/17/2022 Pathology Results   Foundation 1: PD-L1 CPS 60, PI3KCA mutation   06/22/2022 -  Chemotherapy   Patient is on Treatment Plan : BREAST Pembrolizumab (200) q21d x 24 months       CURRENT THERAPY: Keytruda  INTERVAL HISTORY: [name] 67 y.o. female returns for follow-up and evaluation prior to receiving Keytruda.  She is doing well today.  Since we saw her last her daughter got married.  She denies any significant issues.   Patient Active Problem List   Diagnosis Date Noted   Port-A-Cath in place 07/13/2022   Metastatic carcinoma (HCC) 06/06/2022   Small bowel mass 05/17/2022   SBO (small bowel obstruction) (HCC) 03/31/2022   Genetic testing 11/10/2021   Family history of breast cancer 10/27/2021   Family history of colon cancer 10/27/2021   S/P breast reconstruction, bilateral 09/30/2019   S/P mastectomy, bilateral 04/13/2019   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   Weight gain 09/09/2013   Encounter for counseling 11/25/2012   Screening for malignant neoplasm of the cervix 11/25/2012   Menopause 10/08/2012   Unspecified vitamin D deficiency 10/08/2012   Hyperlipemia 10/08/2012   Osteoarthritis 06/04/2012    has No Known Allergies.  MEDICAL HISTORY: Past Medical History:  Diagnosis Date  Arthritis    Backache, unspecified    Cancer (HCC)    breast s/p BL mastectomies   Colon polyps    Family history of breast cancer 10/27/2021   Family history of colon cancer 10/27/2021    Hyperlipidemia    Mitral valve prolapse of mother during pregnancy    PONV (postoperative nausea and vomiting)     SURGICAL HISTORY: Past Surgical History:  Procedure Laterality Date   BOWEL RESECTION N/A 05/17/2022   Procedure: SMALL BOWEL RESECTION;  Surgeon: Quentin Ore, MD;  Location: [facility];  Service: General;  Laterality: N/A;   BREAST RECONSTRUCTION WITH PLACEMENT OF TISSUE EXPANDER AND FLEX HD (ACELLULAR HYDRATED DERMIS) Bilateral 04/05/2019   Procedure: BILATERAL BREAST RECONSTRUCTION WITH PLACEMENT OF TISSUE EXPANDER AND FLEX HD (ACELLULAR HYDRATED DERMIS);  Surgeon: Peggye Form, DO;  Location: [facility];  Service: Plastics;  Laterality: Bilateral;   ENDOMETRIAL ABLATION  1998   GIVENS CAPSULE STUDY N/A 12/04/2021   Procedure: GIVENS CAPSULE STUDY;  Surgeon: Charna Elizabeth, MD;  Location: [facility];  Service: Gastroenterology;  Laterality: N/A;   IR IMAGING GUIDED PORT INSERTION  07/12/2022   KNEE SURGERY Right 2007   LAPAROSCOPY N/A 04/14/2022   Procedure: LAPAROSCOPY DIAGNOSTIC, G-TUBE PLACEMENT; OMENTUM BIOPSY; LYSIS OF ADHESIONS;  Surgeon: Quentin Ore, MD;  Location: [facility];  Service: General;  Laterality: N/A;   LIPOSUCTION WITH LIPOFILLING Bilateral 01/30/2020   Procedure: LIPOSUCTION WITH LIPOFILLING;  Surgeon: Peggye Form, DO;  Location: [facility];  Service: Plastics;  Laterality: Bilateral;  90 min, please   MASTECTOMY W/ SENTINEL NODE BIOPSY Bilateral 04/05/2019   Procedure: BILATERAL MASTECTOMIES WITH RIGHT SENTINEL LYMPH NODE BIOPSY;  Surgeon: Griselda Miner, MD;  Location: [facility];  Service: General;  Laterality: Bilateral;   REMOVAL OF BILATERAL TISSUE EXPANDERS WITH PLACEMENT OF BILATERAL BREAST IMPLANTS Bilateral 09/26/2019   Procedure: REMOVAL OF BILATERAL TISSUE EXPANDERS WITH PLACEMENT OF BILATERAL BREAST IMPLANTS;  Surgeon: Peggye Form, DO;  Location: [facility];  Service: Plastics;  Laterality: Bilateral;   RHINOPLASTY  1977   TONSILLECTOMY  1961    SOCIAL HISTORY: Social History   Socioeconomic History   Marital status: Married    Spouse name: Actor   Number of children: 3   Years of education: 16+   Highest education level: Not on file  Occupational History    Employer: [employer]   Occupation: Herbalist  Tobacco Use   Smoking status: Never   Smokeless tobacco: Never  Vaping Use   Vaping Use: Never used  Substance and Sexual Activity   Alcohol use: Not Currently    Comment: 2-3/week   Drug use: No   Sexual activity: Yes    Partners: Male  Other Topics Concern   Not on file  Social History Narrative   Marital Status:  Married Midwife)    Children:  G3 P3 Daughters (Lauren, [place]ma [place], [place])    Pets:  Poodle (Mollie); Cats Billey Gosling, Delorise Shiner)    Living Situation: Lives with spouse.  Her daughters live in [place].    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[employer])    Education:  Best boy)    Tobacco Use/Exposure:  None    Alcohol Use:  Occasional   Drug Use:  None   Diet:  Regular   Exercise:  Walking   Hobbies:  Social research officer, government, Film/video editor  Social Determinants of Health   Financial Resource Strain: Not on file  Food Insecurity: No Food Insecurity (04/13/2022)   Hunger Vital Sign    Worried About Running Out of Food in the Last Year: Never true    Ran Out of Food in the Last Year: Never true  Transportation Needs: No Transportation Needs (04/13/2022)   PRAPARE - Administrator, Civil Service (Medical): No    Lack of Transportation (Non-Medical): No  Physical Activity: Not on file  Stress: Not on file  Social Connections: Not on file  Intimate Partner Violence: Not At Risk (04/13/2022)   Humiliation, Afraid, Rape, and Kick questionnaire    Fear of Current or Ex-Partner: No    Emotionally Abused: No     Physically Abused: No    Sexually Abused: No    FAMILY HISTORY: Family History  Problem Relation Age of Onset   Arthritis Mother    Colon cancer Mother 68   Melanoma Father        dx 12s; nose   Lung cancer Paternal Uncle        dx after 9; smoking hx   Breast cancer Maternal Grandmother        dx 51s   Hyperlipidemia Paternal Grandmother    Cancer Paternal Grandfather        unknown type; d. before 17   Colon cancer Other        MGM's mother    Review of Systems  Constitutional:  Negative for appetite change, chills, fatigue, fever and unexpected weight change.  HENT:   Negative for hearing loss, lump/mass and trouble swallowing.   Eyes:  Negative for eye problems and icterus.  Respiratory:  Negative for chest tightness, cough and shortness of breath.   Cardiovascular:  Negative for chest pain, leg swelling and palpitations.  Gastrointestinal:  Negative for abdominal distention, abdominal pain, constipation, diarrhea, nausea and vomiting.  Endocrine: Negative for hot flashes.  Genitourinary:  Negative for difficulty urinating.   Musculoskeletal:  Negative for arthralgias.  Skin:  Negative for itching and rash.  Neurological:  Negative for dizziness, extremity weakness, headaches and numbness.  Hematological:  Negative for adenopathy. Does not bruise/bleed easily.  Psychiatric/Behavioral:  Negative for depression. The patient is not nervous/anxious.       PHYSICAL EXAMINATION    Vitals:   07/13/22 1522  BP: (!) 145/71  Pulse: 76  Resp: 15  Temp: 97.7 F (36.5 C)  SpO2: (!) 85%  Will recheck oxygenation in infusion  Physical Exam Constitutional:      General: She is not in acute distress.    Appearance: Normal appearance. She is not toxic-appearing.  HENT:     Head: Normocephalic and atraumatic.  Eyes:     General: No scleral icterus. Cardiovascular:     Rate and Rhythm: Normal rate and regular rhythm.     Pulses: Normal pulses.     Heart sounds: Normal  heart sounds.  Pulmonary:     Effort: Pulmonary effort is normal.     Breath sounds: Normal breath sounds.  Abdominal:     General: Abdomen is flat. Bowel sounds are normal. There is no distension.     Palpations: Abdomen is soft.     Tenderness: There is no abdominal tenderness.  Musculoskeletal:        General: No swelling.     Cervical back: Neck supple.  Lymphadenopathy:     Cervical: No cervical adenopathy.  Skin:    General: Skin  is warm and dry.     Findings: No rash.  Neurological:     General: No focal deficit present.     Mental Status: She is alert.  Psychiatric:        Mood and Affect: Mood normal.        Behavior: Behavior normal.     LABORATORY DATA:  CBC    Component Value Date/Time   WBC 6.5 07/13/2022 1447   WBC 11.5 (H) 05/18/2022 0510   RBC 4.02 07/13/2022 1447   HGB 13.1 07/13/2022 1447   HCT 38.3 07/13/2022 1447   PLT 215 07/13/2022 1447   MCV 95.3 07/13/2022 1447   MCH 32.6 07/13/2022 1447   MCHC 34.2 07/13/2022 1447   RDW 12.4 07/13/2022 1447   LYMPHSABS 1.9 07/13/2022 1447   MONOABS 0.6 07/13/2022 1447   EOSABS 0.1 07/13/2022 1447   BASOSABS 0.0 07/13/2022 1447    CMP     Component Value Date/Time   NA 139 07/13/2022 1447   K 4.2 07/13/2022 1447   CL 106 07/13/2022 1447   CO2 28 07/13/2022 1447   GLUCOSE 90 07/13/2022 1447   BUN 14 07/13/2022 1447   CREATININE 0.75 07/13/2022 1447   CREATININE 0.66 08/28/2012 1400   CALCIUM 9.1 07/13/2022 1447   PROT 6.6 07/13/2022 1447   ALBUMIN 4.3 07/13/2022 1447   AST 21 07/13/2022 1447   ALT 19 07/13/2022 1447   ALKPHOS 68 07/13/2022 1447   BILITOT 0.5 07/13/2022 1447   GFRNONAA >60 07/13/2022 1447   GFRNONAA >89 08/28/2012 1400   GFRAA >60 01/17/2019 1301   GFRAA >89 08/28/2012 1400       ASSESSMENT and THERAPY PLAN:   Metastatic carcinoma (HCC) [name] is a 67 year old woman with 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    [name] is doing quite well today.  She is tolerating the Keytruda well and will proceed with this again today.  I reviewed her labs with her in detail which were all completely normal.  She will return every 3 weeks for labs, follow-up, and her next infusion.    All questions were answered. The patient knows to call the clinic with any problems, questions or concerns. We can certainly see the patient much sooner if necessary.  Total encounter time:20 minutes*in face-to-face visit time, chart review, lab review, care coordination, order entry, and documentation of the encounter time.  Lillard Anes, NP 07/13/22 4:03 PM Medical Oncology and Hematology [facility] [address] [place], [place] [address] Tel. [phone]    Fax. [phone]  *Total Encounter Time as defined by the Centers for Medicare and Medicaid Services includes, in addition to the face-to-face time of a patient visit (documented in the note above) non-face-to-face time: obtaining and reviewing outside history, ordering and reviewing medications, tests or procedures, care coordination (communications with other health care professionals or caregivers) and documentation in the medical record.

## 2022-07-14 ENCOUNTER — Other Ambulatory Visit: Payer: Self-pay

## 2022-07-14 LAB — CA 125: Cancer Antigen (CA) 125: 14.4 U/mL (ref 0.0–38.1)

## 2022-07-19 ENCOUNTER — Encounter: Payer: Self-pay | Admitting: Hematology and Oncology

## 2022-07-21 ENCOUNTER — Telehealth: Payer: Self-pay | Admitting: Hematology and Oncology

## 2022-07-21 ENCOUNTER — Other Ambulatory Visit: Payer: Self-pay

## 2022-07-21 NOTE — Telephone Encounter (Signed)
Scheduled appointments per WQ. Patient is aware of all made appointments. 

## 2022-07-23 ENCOUNTER — Other Ambulatory Visit: Payer: Self-pay

## 2022-07-30 ENCOUNTER — Telehealth: Payer: Self-pay | Admitting: *Deleted

## 2022-07-30 NOTE — Telephone Encounter (Signed)
Pt left VM stating she just wanted to let his office know that "the steri strips that was applied to my port site at the last visit are still there "-  Return call number given as [phone].  This RN returned call obtained VM- message left above is appropriate - not an issue for use and will evaluate at next visit.

## 2022-08-03 ENCOUNTER — Inpatient Hospital Stay (HOSPITAL_BASED_OUTPATIENT_CLINIC_OR_DEPARTMENT_OTHER): Payer: PPO | Admitting: Hematology and Oncology

## 2022-08-03 ENCOUNTER — Other Ambulatory Visit: Payer: PPO

## 2022-08-03 ENCOUNTER — Inpatient Hospital Stay: Payer: PPO | Attending: Hematology and Oncology

## 2022-08-03 ENCOUNTER — Inpatient Hospital Stay: Payer: PPO

## 2022-08-03 ENCOUNTER — Ambulatory Visit: Payer: PPO

## 2022-08-03 ENCOUNTER — Inpatient Hospital Stay: Payer: PPO | Admitting: Dietician

## 2022-08-03 ENCOUNTER — Ambulatory Visit: Payer: PPO | Admitting: Hematology and Oncology

## 2022-08-03 VITALS — BP 124/73 | HR 71

## 2022-08-03 VITALS — BP 131/72 | HR 69 | Temp 97.3°F | Resp 18 | Ht 67.0 in | Wt 148.0 lb

## 2022-08-03 DIAGNOSIS — C50919 Malignant neoplasm of unspecified site of unspecified female breast: Secondary | ICD-10-CM | POA: Insufficient documentation

## 2022-08-03 DIAGNOSIS — R935 Abnormal findings on diagnostic imaging of other abdominal regions, including retroperitoneum: Secondary | ICD-10-CM | POA: Insufficient documentation

## 2022-08-03 DIAGNOSIS — Z79620 Long term (current) use of immunosuppressive biologic: Secondary | ICD-10-CM | POA: Insufficient documentation

## 2022-08-03 DIAGNOSIS — Z95828 Presence of other vascular implants and grafts: Secondary | ICD-10-CM

## 2022-08-03 DIAGNOSIS — C799 Secondary malignant neoplasm of unspecified site: Secondary | ICD-10-CM

## 2022-08-03 DIAGNOSIS — C784 Secondary malignant neoplasm of small intestine: Secondary | ICD-10-CM | POA: Diagnosis present

## 2022-08-03 DIAGNOSIS — Z5112 Encounter for antineoplastic immunotherapy: Secondary | ICD-10-CM | POA: Diagnosis present

## 2022-08-03 LAB — CMP (CANCER CENTER ONLY)
ALT: 15 U/L (ref 0–44)
AST: 19 U/L (ref 15–41)
Albumin: 4.3 g/dL (ref 3.5–5.0)
Alkaline Phosphatase: 75 U/L (ref 38–126)
Anion gap: 5 (ref 5–15)
BUN: 26 mg/dL — ABNORMAL HIGH (ref 8–23)
CO2: 31 mmol/L (ref 22–32)
Calcium: 9.9 mg/dL (ref 8.9–10.3)
Chloride: 104 mmol/L (ref 98–111)
Creatinine: 0.74 mg/dL (ref 0.44–1.00)
GFR, Estimated: >60 mL/min (ref >60)
Glucose, Bld: 96 mg/dL (ref 70–99)
Potassium: 4.1 mmol/L (ref 3.5–5.1)
Sodium: 140 mmol/L (ref 135–145)
Total Bilirubin: 0.5 mg/dL (ref 0.3–1.2)
Total Protein: 6.7 g/dL (ref 6.5–8.1)

## 2022-08-03 LAB — CBC WITH DIFFERENTIAL (CANCER CENTER ONLY)
Abs Immature Granulocytes: 0.01 10*3/uL (ref 0.00–0.07)
Basophils Absolute: 0.1 10*3/uL (ref 0.0–0.1)
Basophils Relative: 1 %
Eosinophils Absolute: 0 10*3/uL (ref 0.0–0.5)
Eosinophils Relative: 1 %
HCT: 38.5 % (ref 36.0–46.0)
Hemoglobin: 13.1 g/dL (ref 12.0–15.0)
Immature Granulocytes: 0 %
Lymphocytes Relative: 41 %
Lymphs Abs: 1.7 10*3/uL (ref 0.7–4.0)
MCH: 32.3 pg (ref 26.0–34.0)
MCHC: 34 g/dL (ref 30.0–36.0)
MCV: 94.8 fL (ref 80.0–100.0)
Monocytes Absolute: 0.4 10*3/uL (ref 0.1–1.0)
Monocytes Relative: 10 %
Neutro Abs: 2 10*3/uL (ref 1.7–7.7)
Neutrophils Relative %: 47 %
Platelet Count: 227 10*3/uL (ref 150–400)
RBC: 4.06 MIL/uL (ref 3.87–5.11)
RDW: 12 % (ref 11.5–15.5)
WBC Count: 4.3 10*3/uL (ref 4.0–10.5)
nRBC: 0 % (ref 0.0–0.2)

## 2022-08-03 LAB — TSH: TSH: 2.038 u[IU]/mL (ref 0.350–4.500)

## 2022-08-03 MED ORDER — HEPARIN SOD (PORK) LOCK FLUSH 100 UNIT/ML IV SOLN
500.0000 [IU] | Freq: Once | INTRAVENOUS | Status: AC | PRN
  Administered 2022-08-03: 500 [IU]

## 2022-08-03 MED ORDER — SODIUM CHLORIDE 0.9 % IV SOLN
200.0000 mg | Freq: Once | INTRAVENOUS | Status: AC
  Administered 2022-08-03: 200 mg via INTRAVENOUS
  Filled 2022-08-03: qty 200

## 2022-08-03 MED ORDER — SODIUM CHLORIDE 0.9% FLUSH
10.0000 mL | INTRAVENOUS | Status: DC | PRN
  Administered 2022-08-03: 10 mL

## 2022-08-03 MED ORDER — SCOPOLAMINE 1 MG/3DAYS TD PT72: 1.0000 | MEDICATED_PATCH | TRANSDERMAL | 0 refills | Status: DC

## 2022-08-03 MED ORDER — SODIUM CHLORIDE 0.9% FLUSH
10.0000 mL | Freq: Once | INTRAVENOUS | Status: AC
  Administered 2022-08-03: 10 mL

## 2022-08-03 MED ORDER — SODIUM CHLORIDE 0.9 % IV SOLN: Freq: Once | INTRAVENOUS | Status: AC

## 2022-08-03 NOTE — Progress Notes (Signed)
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 S/p resection 3/18 with venting Gtube (removed 4/18). Patient is currently receiving keytruda q21d. She is under the care of Dr. Al Pimple.   Past medical history includes bilateral mastectomies (04/13/19), HLD, vit D deficiency  Met with patient in infusion. Husband is present at visit. Patient reports good appetite and eating 2 meals and drinking protein shake. She is tolerating regular diet without difficulty s/p tube removal. She reports hole has not closed yet. She has minimal drainage. MD has looked at this today per pt. Patient is taking dulcolax daily. This is working well for her. Patient is looking forward to resuming light exercise which she was cleared for today.   Nutrition Focused Physical Exam: deferred    Medications: Os-cal, pepcid, MVI, zofran, compazine, crestor   Labs: BUN 26   Anthropometrics:   Height: 5'7" Weight: 148 lb  UBW: 145 lb  BMI: 23.18   NUTRITION DIAGNOSIS: Food and nutrition related knowledge deficit related to cancer s/p SBO with resection as evidenced by no prior need for associated nutrition information     INTERVENTION:  Encouraged small frequent meals and snacks with adequate calories and protein to support healing  Continue drinking daily protein shake Continue bowel regimen per MD    MONITORING, EVALUATION, GOAL: Patient will tolerate increased calories and protein to minimize weight loss    Next Visit: To be scheduled as needed

## 2022-08-03 NOTE — Patient Instructions (Signed)
[facility]  Discharge Instructions: Thank you for choosing [facility] to provide your oncology and hematology care.   If you have a lab appointment with the Cancer Center, please go directly to the Cancer Center and check in at the registration area.   Wear comfortable clothing and clothing appropriate for easy access to any Portacath or PICC line.   We strive to give you quality time with your provider. You may need to reschedule your appointment if you arrive late (15 or more minutes).  Arriving late affects you and other patients whose appointments are after yours.  Also, if you miss three or more appointments without notifying the office, you may be dismissed from the clinic at the provider's discretion.      For prescription refill requests, have your pharmacy contact our office and allow 72 hours for refills to be completed.    Today you received the following chemotherapy and/or immunotherapy agents: Keytruda      To help prevent nausea and vomiting after your treatment, we encourage you to take your nausea medication as directed.  BELOW ARE SYMPTOMS THAT SHOULD BE REPORTED IMMEDIATELY: *FEVER GREATER THAN 100.4 F (38 C) OR HIGHER *CHILLS OR SWEATING *NAUSEA AND VOMITING THAT IS NOT CONTROLLED WITH YOUR NAUSEA MEDICATION *UNUSUAL SHORTNESS OF BREATH *UNUSUAL BRUISING OR BLEEDING *URINARY PROBLEMS (pain or burning when urinating, or frequent urination) *BOWEL PROBLEMS (unusual diarrhea, constipation, pain near the anus) TENDERNESS IN MOUTH AND THROAT WITH OR WITHOUT PRESENCE OF ULCERS (sore throat, sores in mouth, or a toothache) UNUSUAL RASH, SWELLING OR PAIN  UNUSUAL VAGINAL DISCHARGE OR ITCHING   Items with * indicate a potential emergency and should be followed up as soon as possible or go to the Emergency Department if any problems should occur.  Please show the CHEMOTHERAPY ALERT CARD or IMMUNOTHERAPY ALERT CARD at  check-in to the Emergency Department and triage nurse.  Should you have questions after your visit or need to cancel or reschedule your appointment, please contact [facility]  Dept: [phone]  and follow the prompts.  Office hours are 8:00 a.m. to 4:30 p.m. Monday - Friday. Please note that voicemails left after 4:00 p.m. may not be returned until the following business day.  We are closed weekends and major holidays. You have access to a nurse at all times for urgent questions. Please call the main number to the clinic Dept: [phone] and follow the prompts.   For any non-urgent questions, you may also contact your provider using MyChart. We now offer e-Visits for anyone 18 and older to request care online for non-urgent symptoms. For details visit [email or web address].   Also download the MyChart app! Go to the app store, search "MyChart", open the app, select [facility], and log in with your MyChart username and password.   

## 2022-08-03 NOTE — Assessment & Plan Note (Signed)
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   It is highly unusual to see a microinvasive breast cancer and predominantly DCIS with negative margins negative lymph nodes to present with metastatic disease. Also the location of her disease seems to be highly unusual for breast cancer metastasis. Given poorly differentiated carcinoma, I spoke to [facility] pathology team who suggested that the carcinoma has not arisen in small bowel, however it was very poorly differentiated and given her history of breast cancer they wondered if this could be related to breast cancer.  The pathologist is unable to assist with primary and suggested that we correlate clinically.   Outpatient PET/CT showed 2 short segments of small bowel with mild wall thickening and increased radiotracer uptake within the left upper quadrant of abdominal and lower pelvis.  Findings may represent known small bowel metastasis or additional sites of disease   She underwent surgical resection, two small bowel masses, neg margins, once again path with metastatic breast cancer likely, triple neg. Foundation one showed CPS 60%, PIK3CA mutation, no other targets. We have discussed about considering immunotherapy maintenance for at least a year and periodic imaging.  She is now on Keytruda every 3 weeks and has been tolerating it remarkably well.  I had made some adjustments to the dose in October since she is going on a 6-week trip to [place] and [place].  For that 1 dose, I have ordered 400 mg of Keytruda.  No  concerns on physical exam.  Have also ordered reimaging to follow-up on the metastatic breast cancer.  She will return to clinic in 3 weeks as scheduled.   Thank you for consulting [place] in the care of this patient.  Please do not hesitate to contact us with any additional questions or concerns.

## 2022-08-03 NOTE — Progress Notes (Signed)
[facility] Cancer Follow up:    Caroline Inch, MD [address] [place] [address]   DIAGNOSIS:  Cancer Staging  No matching staging information was found for the patient.   SUMMARY OF ONCOLOGIC HISTORY: Oncology History  Metastatic carcinoma (HCC)  10/2021 Genetic Testing   Negative. 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  Genes tested: AIP, ALK, APC, ATM, AXIN2, BAP1, BARD1, BLM, BMPR1A, BRCA1, BRCA2, BRIP1, CDC73, CDH1, CDK4, CDKN1B, CDKN2A, CHEK2, CTNNA1, DICER1, FANCC, FH, FLCN, GALNT12, KIF1B, LZTR1, MAX, MEN1, MET, MLH1, MSH2, MSH3, MSH6, MUTYH, NBN, NF1, NF2, NTHL1, PALB2, PHOX2B, PMS2, POT1, PRKAR1A, PTCH1, PTEN, RAD51C, RAD51D, RB1, RECQL, RET, SDHA, SDHAF2, SDHB, SDHC, SDHD, SMAD4, SMARCA4, SMARCB1, SMARCE1, STK11, SUFU, TMEM127, TP53, TSC1, TSC2, VHL and XRCC2 (sequencing and deletion/duplication); EGFR, EGLN1, HOXB13, KIT, MITF, PDGFRA, POLD1, and POLE (sequencing only); EPCAM and GREM1 (deletion/duplication only).    04/08/2022 Initial Biopsy   [facility] Enteroscopy and small bowel mass biopsy: The enteroscopy showed normal esophagus, stomach and examined duodenum.  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  Histology showed sheets to partially nested neoplastic cells with expression of CAM5.2 and GATA3, negative for CDX2, could represent metastatic breast cancer.  ER negative, PR negative, HER2 2+, FISH negative.    04/14/2022 Initial Biopsy   Omental biopsy: benign omental tissue   05/02/2022 PET scan   IMPRESSION: 1. There are 2 short segments of small bowel with mild wall thickening and increased radiotracer uptake within the left upper quadrant of the abdomen and lower pelvis. Findings may represent known small-bowel metastasis and/or additional sites of disease. 2. No tracer avid nodal metastasis or solid organ metastasis. 3.  Aortic  Atherosclerosis (ICD10-I70.0).     Electronically Signed   By: Signa Kell M.D.   On: 05/02/2022 09:49   05/17/2022 Surgery   Small bowel resection: Poorly differentiated carcinoma, 3.7cm; margins negative and 5.3cm margins negative, 1LN negative for cancer, likely breast primary   05/17/2022 Pathology Results   Foundation 1: PD-L1 CPS 60, PI3KCA mutation   06/22/2022 -  Chemotherapy   Patient is on Treatment Plan : BREAST Pembrolizumab (200) q21d x 24 months       CURRENT THERAPY: Keytruda  INTERVAL HISTORY: [name] 67 y.o. female returns for follow-up and evaluation prior to receiving Keytruda.  She is doing well today.  Since we saw her last her daughter got married.  She denies any significant issues.  She complains of some mild fatigue with Keytruda.  No arthralgias, diarrhea, change in breathing, change in bowel habits.  She has some baseline constipation and has been using Dulcolax for constipation.  Rest of the pertinent 10 point ROS reviewed and negative   Patient Active Problem List   Diagnosis Date Noted   Port-A-Cath in place 07/13/2022   Metastatic carcinoma (HCC) 06/06/2022   Small bowel mass 05/17/2022   SBO (small bowel obstruction) (HCC) 03/31/2022   Genetic testing 11/10/2021   Family history of breast cancer 10/27/2021   Family history of colon cancer 10/27/2021   S/P breast reconstruction, bilateral 09/30/2019   S/P mastectomy, bilateral 04/13/2019   Weight gain 09/09/2013   Encounter for counseling 11/25/2012   Screening for malignant neoplasm of the cervix 11/25/2012   Menopause 10/08/2012   Unspecified vitamin D deficiency 10/08/2012   Hyperlipemia 10/08/2012   Osteoarthritis 06/04/2012    has No Known Allergies.  MEDICAL HISTORY:  Past Medical History:  Diagnosis Date   Arthritis    Backache, unspecified    Cancer (HCC)    breast s/p BL mastectomies   Colon polyps    Family history of breast cancer 10/27/2021   Family  history of colon cancer 10/27/2021   Hyperlipidemia    Mitral valve prolapse of mother during pregnancy    PONV (postoperative nausea and vomiting)     SURGICAL HISTORY: Past Surgical History:  Procedure Laterality Date   BOWEL RESECTION N/A 05/17/2022   Procedure: SMALL BOWEL RESECTION;  Surgeon: Quentin Ore, MD;  Location: [facility];  Service: General;  Laterality: N/A;   BREAST RECONSTRUCTION WITH PLACEMENT OF TISSUE EXPANDER AND FLEX HD (ACELLULAR HYDRATED DERMIS) Bilateral 04/05/2019   Procedure: BILATERAL BREAST RECONSTRUCTION WITH PLACEMENT OF TISSUE EXPANDER AND FLEX HD (ACELLULAR HYDRATED DERMIS);  Surgeon: Peggye Form, DO;  Location: [facility];  Service: Plastics;  Laterality: Bilateral;   ENDOMETRIAL ABLATION  1998   GIVENS CAPSULE STUDY N/A 12/04/2021   Procedure: GIVENS CAPSULE STUDY;  Surgeon: Charna Elizabeth, MD;  Location[facility];  Service: Gastroenterology;  Laterality: N/A;   IR IMAGING GUIDED PORT INSERTION  07/12/2022   KNEE SURGERY Right 2007   LAPAROSCOPY N/A 04/14/2022   Procedure: LAPAROSCOPY DIAGNOSTIC, G-TUBE PLACEMENT; OMENTUM BIOPSY; LYSIS OF ADHESIONS;  Surgeon: Quentin Ore, MD;  Location: [facility];  Service: General;  Laterality: N/A;   LIPOSUCTION WITH LIPOFILLING Bilateral 01/30/2020   Procedure: LIPOSUCTION WITH LIPOFILLING;  Surgeon: Peggye Form, DO;  Location: [facility];  Service: Plastics;  Laterality: Bilateral;  90 min, please   MASTECTOMY W/ SENTINEL NODE BIOPSY Bilateral 04/05/2019   Procedure: BILATERAL MASTECTOMIES WITH RIGHT SENTINEL LYMPH NODE BIOPSY;  Surgeon: Griselda Miner, MD;  Location: [facility];  Service: General;  Laterality: Bilateral;   REMOVAL OF BILATERAL TISSUE EXPANDERS WITH PLACEMENT OF BILATERAL BREAST IMPLANTS Bilateral 09/26/2019   Procedure: REMOVAL OF BILATERAL TISSUE EXPANDERS WITH PLACEMENT OF BILATERAL BREAST IMPLANTS;  Surgeon: Peggye Form, DO;   Location: [facility];  Service: Plastics;  Laterality: Bilateral;   RHINOPLASTY  1977   TONSILLECTOMY  1961    SOCIAL HISTORY: Social History   Socioeconomic History   Marital status: Married    Spouse name: Actor   Number of children: 3   Years of education: 16+   Highest education level: Not on file  Occupational History    Employer: [employer]   Occupation: Herbalist  Tobacco Use   Smoking status: Never   Smokeless tobacco: Never  Vaping Use   Vaping Use: Never used  Substance and Sexual Activity   Alcohol use: Not Currently    Comment: 2-3/week   Drug use: No   Sexual activity: Yes    Partners: Male  Other Topics Concern   Not on file  Social History Narrative   Marital Status:  Married Midwife)    Children:  G3 P3 Daughters (Lauren, Valma Cava, Dietrich)    Pets:  Poodle (Mollie); Cats Billey Gosling, Delorise Shiner)    Living Situation: Lives with spouse.  Her daughters live in [place].    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[employer])    Education:  Best boy)    Tobacco Use/Exposure:  None    Alcohol Use:  Occasional   Drug Use:  None   Diet:  Regular   Exercise:  Walking   Hobbies:  Gardening, Shopping              Social Determinants of Health   Financial Resource Strain: Not on file  Food Insecurity: No Food Insecurity (04/13/2022)   Hunger Vital Sign    Worried About Running Out of Food in the Last Year: Never true    Ran Out of Food in the Last Year: Never true  Transportation Needs: No Transportation Needs (04/13/2022)   PRAPARE - Administrator, Civil Service (Medical): No    Lack of Transportation (Non-Medical): No  Physical Activity: Not on file  Stress: Not on file  Social Connections: Not on file  Intimate Partner Violence: Not At Risk (04/13/2022)   Humiliation, Afraid, Rape, and Kick questionnaire    Fear of Current or Ex-Partner: No     Emotionally Abused: No    Physically Abused: No    Sexually Abused: No    FAMILY HISTORY: Family History  Problem Relation Age of Onset   Arthritis Mother    Colon cancer Mother 74   Melanoma Father        dx 90s; nose   Lung cancer Paternal Uncle        dx after 8; smoking hx   Breast cancer Maternal Grandmother        dx 64s   Hyperlipidemia Paternal Grandmother    Cancer Paternal Grandfather        unknown type; d. before 74   Colon cancer Other        MGM's mother    Review of Systems  Constitutional:  Negative for appetite change, chills, fatigue, fever and unexpected weight change.  HENT:   Negative for hearing loss, lump/mass and trouble swallowing.   Eyes:  Negative for eye problems and icterus.  Respiratory:  Negative for chest tightness, cough and shortness of breath.   Cardiovascular:  Negative for chest pain, leg swelling and palpitations.  Gastrointestinal:  Negative for abdominal distention, abdominal pain, constipation, diarrhea, nausea and vomiting.  Endocrine: Negative for hot flashes.  Genitourinary:  Negative for difficulty urinating.   Musculoskeletal:  Negative for arthralgias.  Skin:  Negative for itching and rash.  Neurological:  Negative for dizziness, extremity weakness, headaches and numbness.  Hematological:  Negative for adenopathy. Does not bruise/bleed easily.  Psychiatric/Behavioral:  Negative for depression. The patient is not nervous/anxious.       PHYSICAL EXAMINATION    Vitals:   08/03/22 1502  BP: 131/72  Pulse: 69  Resp: 18  Temp: (!) 97.3 F (36.3 C)  SpO2: 100%   Physical Exam Constitutional:      General: She is not in acute distress.    Appearance: Normal appearance. She is not toxic-appearing.  HENT:     Head: Normocephalic and atraumatic.  Eyes:     General: No scleral icterus. Cardiovascular:     Rate and Rhythm: Normal rate and regular rhythm.     Pulses: Normal pulses.     Heart sounds: Normal heart sounds.   Pulmonary:     Effort: Pulmonary effort is normal.     Breath sounds: Normal breath sounds.  Chest:     Comments: She is status post bilateral mastectomy.  No palpable masses or regional adenopathy. Abdominal:     General: Abdomen is flat. Bowel sounds are normal. There is no distension.     Palpations: Abdomen is soft.     Tenderness: There is no abdominal tenderness.  Musculoskeletal:  General: No swelling.     Cervical back: Neck supple.  Lymphadenopathy:     Cervical: No cervical adenopathy.  Skin:    General: Skin is warm and dry.     Findings: No rash.  Neurological:     General: No focal deficit present.     Mental Status: She is alert.  Psychiatric:        Mood and Affect: Mood normal.        Behavior: Behavior normal.     LABORATORY DATA:  CBC    Component Value Date/Time   WBC 4.3 08/03/2022 1439   WBC 11.5 (H) 05/18/2022 0510   RBC 4.06 08/03/2022 1439   HGB 13.1 08/03/2022 1439   HCT 38.5 08/03/2022 1439   PLT 227 08/03/2022 1439   MCV 94.8 08/03/2022 1439   MCH 32.3 08/03/2022 1439   MCHC 34.0 08/03/2022 1439   RDW 12.0 08/03/2022 1439   LYMPHSABS 1.7 08/03/2022 1439   MONOABS 0.4 08/03/2022 1439   EOSABS 0.0 08/03/2022 1439   BASOSABS 0.1 08/03/2022 1439    CMP     Component Value Date/Time   NA 140 08/03/2022 1439   K 4.1 08/03/2022 1439   CL 104 08/03/2022 1439   CO2 31 08/03/2022 1439   GLUCOSE 96 08/03/2022 1439   BUN 26 (H) 08/03/2022 1439   CREATININE 0.74 08/03/2022 1439   CREATININE 0.66 08/28/2012 1400   CALCIUM 9.9 08/03/2022 1439   PROT 6.7 08/03/2022 1439   ALBUMIN 4.3 08/03/2022 1439   AST 19 08/03/2022 1439   ALT 15 08/03/2022 1439   ALKPHOS 75 08/03/2022 1439   BILITOT 0.5 08/03/2022 1439   GFRNONAA >60 08/03/2022 1439   GFRNONAA >89 08/28/2012 1400   GFRAA >60 01/17/2019 1301   GFRAA >89 08/28/2012 1400       ASSESSMENT and THERAPY PLAN:   Metastatic carcinoma (HCC) ASSESSMENT & PLAN:    This is a  very pleasant 67 year old postmenopausal female patient with 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   It is highly unusual to see a microinvasive breast cancer and predominantly DCIS with negative margins negative lymph nodes to present with metastatic disease. Also the location of her disease seems to be highly unusual for breast cancer metastasis. Given poorly differentiated carcinoma, I spoke to [facility] pathology team who suggested that the carcinoma has not arisen in small bowel, however it was very poorly differentiated and given her history of breast cancer they wondered if this could be related to breast cancer.  The pathologist is unable to assist with primary and suggested that we correlate clinically.   Outpatient PET/CT showed 2 short segments of small bowel with mild wall thickening and increased radiotracer uptake within the left upper quadrant of abdominal and lower pelvis.  Findings may represent known small bowel metastasis or additional sites of disease   She underwent surgical resection, two small bowel masses, neg margins, once again path with metastatic breast cancer likely, triple neg. Foundation one showed CPS 60%, PIK3CA mutation, no other targets. We have discussed about considering immunotherapy maintenance for at least a year and periodic imaging.  She is now on Keytruda every 3 weeks and has been tolerating it remarkably  well.  I had made some adjustments to the dose in October since she is going on a 6-week trip to [place] and [place].  For that 1 dose, I have ordered 400 mg of Keytruda.  No concerns on physical exam.  Have also  ordered reimaging to follow-up on the metastatic breast cancer.  She will return to clinic in 3 weeks as scheduled.   Thank you for consulting [place] in the care of this patient.  Please do not hesitate to contact us with any additional questions or concerns.      All questions were answered. The patient knows to call the clinic with any problems, questions or concerns. We can certainly see the patient much sooner if necessary.  Total encounter time:30 minutes*in face-to-face visit time, chart review, lab review, care coordination, order entry, and documentation of the encounter time.  *Total Encounter Time as defined by the Centers for Medicare and Medicaid Services includes, in addition to the face-to-face time of a patient visit (documented in the note above) non-face-to-face time: obtaining and reviewing outside history, ordering and reviewing medications, tests or procedures, care coordination (communications with other health care professionals or caregivers) and documentation in the medical record.

## 2022-08-05 LAB — T4: T4, Total: 5.7 ug/dL (ref 4.5–12.0)

## 2022-08-07 LAB — CA 125: Cancer Antigen (CA) 125: 10.1 U/mL (ref 0.0–38.1)

## 2022-08-24 ENCOUNTER — Inpatient Hospital Stay: Payer: PPO

## 2022-08-24 ENCOUNTER — Inpatient Hospital Stay (HOSPITAL_BASED_OUTPATIENT_CLINIC_OR_DEPARTMENT_OTHER): Payer: PPO | Admitting: Adult Health

## 2022-08-24 ENCOUNTER — Other Ambulatory Visit: Payer: Self-pay

## 2022-08-24 ENCOUNTER — Encounter: Payer: Self-pay | Admitting: Adult Health

## 2022-08-24 DIAGNOSIS — C799 Secondary malignant neoplasm of unspecified site: Secondary | ICD-10-CM

## 2022-08-24 DIAGNOSIS — Z5112 Encounter for antineoplastic immunotherapy: Secondary | ICD-10-CM | POA: Diagnosis not present

## 2022-08-24 DIAGNOSIS — Z95828 Presence of other vascular implants and grafts: Secondary | ICD-10-CM

## 2022-08-24 LAB — CBC WITH DIFFERENTIAL (CANCER CENTER ONLY)
Abs Immature Granulocytes: 0.01 10*3/uL (ref 0.00–0.07)
Basophils Absolute: 0 10*3/uL (ref 0.0–0.1)
Basophils Relative: 1 %
Eosinophils Absolute: 0.1 10*3/uL (ref 0.0–0.5)
Eosinophils Relative: 2 %
HCT: 40.5 % (ref 36.0–46.0)
Hemoglobin: 13.8 g/dL (ref 12.0–15.0)
Immature Granulocytes: 0 %
Lymphocytes Relative: 35 %
Lymphs Abs: 1.5 10*3/uL (ref 0.7–4.0)
MCH: 32.8 pg (ref 26.0–34.0)
MCHC: 34.1 g/dL (ref 30.0–36.0)
MCV: 96.2 fL (ref 80.0–100.0)
Monocytes Absolute: 0.4 10*3/uL (ref 0.1–1.0)
Monocytes Relative: 10 %
Neutro Abs: 2.2 10*3/uL (ref 1.7–7.7)
Neutrophils Relative %: 52 %
Platelet Count: 228 10*3/uL (ref 150–400)
RBC: 4.21 MIL/uL (ref 3.87–5.11)
RDW: 12.2 % (ref 11.5–15.5)
WBC Count: 4.1 10*3/uL (ref 4.0–10.5)
nRBC: 0 % (ref 0.0–0.2)

## 2022-08-24 LAB — CMP (CANCER CENTER ONLY)
ALT: 16 U/L (ref 0–44)
AST: 22 U/L (ref 15–41)
Albumin: 4.2 g/dL (ref 3.5–5.0)
Alkaline Phosphatase: 69 U/L (ref 38–126)
Anion gap: 7 (ref 5–15)
BUN: 26 mg/dL — ABNORMAL HIGH (ref 8–23)
CO2: 27 mmol/L (ref 22–32)
Calcium: 9.9 mg/dL (ref 8.9–10.3)
Chloride: 106 mmol/L (ref 98–111)
Creatinine: 0.8 mg/dL (ref 0.44–1.00)
GFR, Estimated: >60 mL/min (ref >60)
Glucose, Bld: 97 mg/dL (ref 70–99)
Potassium: 4.3 mmol/L (ref 3.5–5.1)
Sodium: 140 mmol/L (ref 135–145)
Total Bilirubin: 0.8 mg/dL (ref 0.3–1.2)
Total Protein: 7.1 g/dL (ref 6.5–8.1)

## 2022-08-24 MED ORDER — HEPARIN SOD (PORK) LOCK FLUSH 100 UNIT/ML IV SOLN
500.0000 [IU] | Freq: Once | INTRAVENOUS | Status: AC | PRN
  Administered 2022-08-24: 500 [IU]

## 2022-08-24 MED ORDER — SODIUM CHLORIDE 0.9 % IV SOLN
200.0000 mg | Freq: Once | INTRAVENOUS | Status: AC
  Administered 2022-08-24: 200 mg via INTRAVENOUS
  Filled 2022-08-24: qty 200

## 2022-08-24 MED ORDER — SODIUM CHLORIDE 0.9 % IV SOLN: Freq: Once | INTRAVENOUS | Status: AC

## 2022-08-24 MED ORDER — SODIUM CHLORIDE 0.9% FLUSH
10.0000 mL | Freq: Once | INTRAVENOUS | Status: AC
  Administered 2022-08-24: 10 mL

## 2022-08-24 MED ORDER — SODIUM CHLORIDE 0.9% FLUSH
10.0000 mL | INTRAVENOUS | Status: DC | PRN
  Administered 2022-08-24: 10 mL

## 2022-08-24 NOTE — Progress Notes (Signed)
[facility] Cancer Follow up:    Caroline Inch, MD [address] [place] [address]   DIAGNOSIS: Metastatic breast cancer  SUMMARY OF ONCOLOGIC HISTORY: Oncology History  Metastatic carcinoma (HCC)  10/2021 Genetic Testing   Negative. 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  Genes tested: AIP, ALK, APC, ATM, AXIN2, BAP1, BARD1, BLM, BMPR1A, BRCA1, BRCA2, BRIP1, CDC73, CDH1, CDK4, CDKN1B, CDKN2A, CHEK2, CTNNA1, DICER1, FANCC, FH, FLCN, GALNT12, KIF1B, LZTR1, MAX, MEN1, MET, MLH1, MSH2, MSH3, MSH6, MUTYH, NBN, NF1, NF2, NTHL1, PALB2, PHOX2B, PMS2, POT1, PRKAR1A, PTCH1, PTEN, RAD51C, RAD51D, RB1, RECQL, RET, SDHA, SDHAF2, SDHB, SDHC, SDHD, SMAD4, SMARCA4, SMARCB1, SMARCE1, STK11, SUFU, TMEM127, TP53, TSC1, TSC2, VHL and XRCC2 (sequencing and deletion/duplication); EGFR, EGLN1, HOXB13, KIT, MITF, PDGFRA, POLD1, and POLE (sequencing only); EPCAM and GREM1 (deletion/duplication only).    04/08/2022 Initial Biopsy   [facility] Enteroscopy and small bowel mass biopsy: The enteroscopy showed normal esophagus, stomach and examined duodenum.  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  Histology showed sheets to partially nested neoplastic cells with expression of CAM5.2 and GATA3, negative for CDX2, could represent metastatic breast cancer.  ER negative, PR negative, HER2 2+, FISH negative.    04/14/2022 Initial Biopsy   Omental biopsy: benign omental tissue   05/02/2022 PET scan   IMPRESSION: 1. There are 2 short segments of small bowel with mild wall thickening and increased radiotracer uptake within the left upper quadrant of the abdomen and lower pelvis. Findings may represent known small-bowel metastasis and/or additional sites of disease. 2. No tracer avid nodal metastasis or solid organ metastasis. 3.  Aortic Atherosclerosis (ICD10-I70.0).     Electronically  Signed   By: Signa Kell M.D.   On: 05/02/2022 09:49   05/17/2022 Surgery   Small bowel resection: Poorly differentiated carcinoma, 3.7cm; margins negative and 5.3cm margins negative, 1LN negative for cancer, likely breast primary   05/17/2022 Pathology Results   Foundation 1: PD-L1 CPS 60, PI3KCA mutation   06/22/2022 -  Chemotherapy   Patient is on Treatment Plan : BREAST Pembrolizumab (200) q21d x 24 months       CURRENT THERAPY: Pembrolizumab  INTERVAL HISTORY: [name] 67 y.o. female returns for follow-up and evaluation prior to receiving treatment with pembrolizumab.  She receives this every 3 weeks and is tolerating it well.  Her only issue has been constipation.  She has some docusate sodium 100 mg that she takes which helps.  She wants to know if she can take this daily.  She denies any new pain or issues today.  She is eating and drinking well.  Her activity level is normal.   Patient Active Problem List   Diagnosis Date Noted   Port-A-Cath in place 07/13/2022   Metastatic carcinoma (HCC) 06/06/2022   Small bowel mass 05/17/2022   SBO (small bowel obstruction) (HCC) 03/31/2022   Genetic testing 11/10/2021   Family history of breast cancer 10/27/2021   Family history of colon cancer 10/27/2021   S/P breast reconstruction, bilateral 09/30/2019   S/P mastectomy, bilateral 04/13/2019   Weight gain 09/09/2013   Encounter for counseling 11/25/2012   Screening for malignant neoplasm of the cervix 11/25/2012   Menopause 10/08/2012   Unspecified vitamin D deficiency 10/08/2012   Hyperlipemia 10/08/2012   Osteoarthritis 06/04/2012    has No Known Allergies.  MEDICAL HISTORY: Past Medical History:  Diagnosis Date   Arthritis  Backache, unspecified    Cancer (HCC)    breast s/p BL mastectomies   Colon polyps    Family history of breast cancer 10/27/2021   Family history of colon cancer 10/27/2021   Hyperlipidemia    Mitral valve prolapse of mother  during pregnancy    PONV (postoperative nausea and vomiting)     SURGICAL HISTORY: Past Surgical History:  Procedure Laterality Date   BOWEL RESECTION N/A 05/17/2022   Procedure: SMALL BOWEL RESECTION;  Surgeon: Quentin Ore, MD;  Location: [facility];  Service: General;  Laterality: N/A;   BREAST RECONSTRUCTION WITH PLACEMENT OF TISSUE EXPANDER AND FLEX HD (ACELLULAR HYDRATED DERMIS) Bilateral 04/05/2019   Procedure: BILATERAL BREAST RECONSTRUCTION WITH PLACEMENT OF TISSUE EXPANDER AND FLEX HD (ACELLULAR HYDRATED DERMIS);  Surgeon: Peggye Form, DO;  Location: [facility];  Service: Plastics;  Laterality: Bilateral;   ENDOMETRIAL ABLATION  1998   GIVENS CAPSULE STUDY N/A 12/04/2021   Procedure: GIVENS CAPSULE STUDY;  Surgeon: Charna Elizabeth, MD;  Location: [facility];  Service: Gastroenterology;  Laterality: N/A;   IR IMAGING GUIDED PORT INSERTION  07/12/2022   KNEE SURGERY Right 2007   LAPAROSCOPY N/A 04/14/2022   Procedure: LAPAROSCOPY DIAGNOSTIC, G-TUBE PLACEMENT; OMENTUM BIOPSY; LYSIS OF ADHESIONS;  Surgeon: Quentin Ore, MD;  Location: [facility];  Service: General;  Laterality: N/A;   LIPOSUCTION WITH LIPOFILLING Bilateral 01/30/2020   Procedure: LIPOSUCTION WITH LIPOFILLING;  Surgeon: Peggye Form, DO;  Location: [facility];  Service: Plastics;  Laterality: Bilateral;  90 min, please   MASTECTOMY W/ SENTINEL NODE BIOPSY Bilateral 04/05/2019   Procedure: BILATERAL MASTECTOMIES WITH RIGHT SENTINEL LYMPH NODE BIOPSY;  Surgeon: Griselda Miner, MD;  Location: [facility];  Service: General;  Laterality: Bilateral;   REMOVAL OF BILATERAL TISSUE EXPANDERS WITH PLACEMENT OF BILATERAL BREAST IMPLANTS Bilateral 09/26/2019   Procedure: REMOVAL OF BILATERAL TISSUE EXPANDERS WITH PLACEMENT OF BILATERAL BREAST IMPLANTS;  Surgeon: Peggye Form, DO;  Location: [facility];  Service: Plastics;  Laterality: Bilateral;    RHINOPLASTY  1977   TONSILLECTOMY  1961    SOCIAL HISTORY: Social History   Socioeconomic History   Marital status: Married    Spouse name: Actor   Number of children: 3   Years of education: 16+   Highest education level: Not on file  Occupational History    Employer: [employer]   Occupation: Herbalist  Tobacco Use   Smoking status: Never   Smokeless tobacco: Never  Vaping Use   Vaping Use: Never used  Substance and Sexual Activity   Alcohol use: Not Currently    Comment: 2-3/week   Drug use: No   Sexual activity: Yes    Partners: Male  Other Topics Concern   Not on file  Social History Narrative   Marital Status:  Married Midwife)    Children:  G3 P3 Daughters (Lauren, Valma Cava, Bruning)    Pets:  Poodle (Mollie); Cats Billey Gosling, Delorise Shiner)    Living Situation: Lives with spouse.  Her daughters live in [place].    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[employer])    Education:  Best boy)    Tobacco Use/Exposure:  None    Alcohol Use:  Occasional   Drug Use:  None   Diet:  Regular   Exercise:  Walking   Hobbies:  Social research officer, government, Film/video editor  Social Determinants of Health   Financial Resource Strain: Not on file  Food Insecurity: No Food Insecurity (04/13/2022)   Hunger Vital Sign    Worried About Running Out of Food in the Last Year: Never true    Ran Out of Food in the Last Year: Never true  Transportation Needs: No Transportation Needs (04/13/2022)   PRAPARE - Administrator, Civil Service (Medical): No    Lack of Transportation (Non-Medical): No  Physical Activity: Not on file  Stress: Not on file  Social Connections: Not on file  Intimate Partner Violence: Not At Risk (04/13/2022)   Humiliation, Afraid, Rape, and Kick questionnaire    Fear of Current or Ex-Partner: No    Emotionally Abused: No    Physically Abused: No    Sexually Abused: No     FAMILY HISTORY: Family History  Problem Relation Age of Onset   Arthritis Mother    Colon cancer Mother 41   Melanoma Father        dx 30s; nose   Lung cancer Paternal Uncle        dx after 89; smoking hx   Breast cancer Maternal Grandmother        dx 58s   Hyperlipidemia Paternal Grandmother    Cancer Paternal Grandfather        unknown type; d. before 37   Colon cancer Other        MGM's mother    Review of Systems  Constitutional:  Negative for appetite change, chills, fatigue, fever and unexpected weight change.  HENT:   Negative for hearing loss, lump/mass and trouble swallowing.   Eyes:  Negative for eye problems and icterus.  Respiratory:  Negative for chest tightness, cough and shortness of breath.   Cardiovascular:  Negative for chest pain, leg swelling and palpitations.  Gastrointestinal:  Positive for constipation. Negative for abdominal distention, abdominal pain, diarrhea, nausea and vomiting.  Endocrine: Negative for hot flashes.  Genitourinary:  Negative for difficulty urinating.   Musculoskeletal:  Negative for arthralgias.  Skin:  Negative for itching and rash.  Neurological:  Negative for dizziness, extremity weakness, headaches and numbness.  Hematological:  Negative for adenopathy. Does not bruise/bleed easily.  Psychiatric/Behavioral:  Negative for depression. The patient is not nervous/anxious.       PHYSICAL EXAMINATION   Onc Performance Status - 08/24/22 1100       KPS SCALE   KPS % SCORE Cares for self, unable to carry on normal activity or to do active work             Vitals:   08/24/22 1044  BP: (!) 116/93  Pulse: (!) 58  Resp: 16  Temp: (!) 97.5 F (36.4 C)  SpO2: 98%    Physical Exam Constitutional:      General: She is not in acute distress.    Appearance: Normal appearance. She is not toxic-appearing.  HENT:     Head: Normocephalic and atraumatic.     Mouth/Throat:     Mouth: Mucous membranes are moist.      Pharynx: Oropharynx is clear. No oropharyngeal exudate or posterior oropharyngeal erythema.  Eyes:     General: No scleral icterus. Cardiovascular:     Rate and Rhythm: Normal rate and regular rhythm.     Pulses: Normal pulses.     Heart sounds: Normal heart sounds.  Pulmonary:     Effort: Pulmonary effort is normal.     Breath sounds: Normal breath sounds.  Abdominal:     General: Abdomen is flat. Bowel sounds are normal. There is no distension.     Palpations: Abdomen is soft.     Tenderness: There is no abdominal tenderness.  Musculoskeletal:        General: No swelling.     Cervical back: Neck supple.  Lymphadenopathy:     Cervical: No cervical adenopathy.  Skin:    General: Skin is warm and dry.     Findings: No rash.  Neurological:     General: No focal deficit present.     Mental Status: She is alert.  Psychiatric:        Mood and Affect: Mood normal.        Behavior: Behavior normal.     LABORATORY DATA:  CBC    Component Value Date/Time   WBC 4.1 08/24/2022 1018   WBC 11.5 (H) 05/18/2022 0510   RBC 4.21 08/24/2022 1018   HGB 13.8 08/24/2022 1018   HCT 40.5 08/24/2022 1018   PLT 228 08/24/2022 1018   MCV 96.2 08/24/2022 1018   MCH 32.8 08/24/2022 1018   MCHC 34.1 08/24/2022 1018   RDW 12.2 08/24/2022 1018   LYMPHSABS 1.5 08/24/2022 1018   MONOABS 0.4 08/24/2022 1018   EOSABS 0.1 08/24/2022 1018   BASOSABS 0.0 08/24/2022 1018    CMP     Component Value Date/Time   NA 140 08/03/2022 1439   K 4.1 08/03/2022 1439   CL 104 08/03/2022 1439   CO2 31 08/03/2022 1439   GLUCOSE 96 08/03/2022 1439   BUN 26 (H) 08/03/2022 1439   CREATININE 0.74 08/03/2022 1439   CREATININE 0.66 08/28/2012 1400   CALCIUM 9.9 08/03/2022 1439   PROT 6.7 08/03/2022 1439   ALBUMIN 4.3 08/03/2022 1439   AST 19 08/03/2022 1439   ALT 15 08/03/2022 1439   ALKPHOS 75 08/03/2022 1439   BILITOT 0.5 08/03/2022 1439   GFRNONAA >60 08/03/2022 1439   GFRNONAA >89 08/28/2012 1400    GFRAA >60 01/17/2019 1301   GFRAA >89 08/28/2012 1400       ASSESSMENT and THERAPY PLAN:   Metastatic carcinoma (HCC) [name] is a 67 year old woman with metastatic breast cancer here today for follow-up and evaluation prior to receiving every 3-week pembrolizumab.  She continues to tolerate the pembrolizumab well.  She has no clinical signs of metastatic breast cancer progression.  She will proceed with treatment today.  Her CBC is normal.  Her c-Met has not resulted in the computer however I am looking at results that were printed off and they demonstrate a sodium of 140, potassium of 4.3, chloride 106, alk phos 69, ALT 16, AST 22, BUN 26, creatinine 0.8, total bilirubin 0.8.  She is okay to treat with these levels.  Constipation.  She is going to continue taking docusate daily which helps manage her bowels and keep them moving.  We also discussed drinking plenty of fluids.  [name] is scheduled to undergo PET scan on August 30, 2022 at 10 AM.  I reviewed this appointment with her and she verified she knows not to have anything to eat or drink 6 hours prior but she can have water.  [name] will return to clinic in 3 weeks for labs, follow-up with Dr. Al Pimple, and her next treatment.     All questions were answered. The patient knows to call the clinic with any problems, questions or concerns. We can certainly see the patient much sooner if necessary.  Total encounter time:20 minutes*in  face-to-face visit time, chart review, lab review, care coordination, order entry, and documentation of the encounter time.    Lillard Anes, NP 08/24/22 11:42 AM Medical Oncology and Hematology [facility] [address] [place], [place] [address] Tel. [phone]    Fax. [phone]  *Total Encounter Time as defined by the Centers for Medicare and Medicaid Services includes, in addition to the face-to-face time of a patient visit (documented in the note above) non-face-to-face time:  obtaining and reviewing outside history, ordering and reviewing medications, tests or procedures, care coordination (communications with other health care professionals or caregivers) and documentation in the medical record.

## 2022-08-24 NOTE — Patient Instructions (Signed)
[facility]  Discharge Instructions: Thank you for choosing [facility] to provide your oncology and hematology care.   If you have a lab appointment with the Cancer Center, please go directly to the Cancer Center and check in at the registration area.   Wear comfortable clothing and clothing appropriate for easy access to any Portacath or PICC line.   We strive to give you quality time with your provider. You may need to reschedule your appointment if you arrive late (15 or more minutes).  Arriving late affects you and other patients whose appointments are after yours.  Also, if you miss three or more appointments without notifying the office, you may be dismissed from the clinic at the provider's discretion.      For prescription refill requests, have your pharmacy contact our office and allow 72 hours for refills to be completed.    Today you received the following chemotherapy and/or immunotherapy agents: Keytruda      To help prevent nausea and vomiting after your treatment, we encourage you to take your nausea medication as directed.  BELOW ARE SYMPTOMS THAT SHOULD BE REPORTED IMMEDIATELY: *FEVER GREATER THAN 100.4 F (38 C) OR HIGHER *CHILLS OR SWEATING *NAUSEA AND VOMITING THAT IS NOT CONTROLLED WITH YOUR NAUSEA MEDICATION *UNUSUAL SHORTNESS OF BREATH *UNUSUAL BRUISING OR BLEEDING *URINARY PROBLEMS (pain or burning when urinating, or frequent urination) *BOWEL PROBLEMS (unusual diarrhea, constipation, pain near the anus) TENDERNESS IN MOUTH AND THROAT WITH OR WITHOUT PRESENCE OF ULCERS (sore throat, sores in mouth, or a toothache) UNUSUAL RASH, SWELLING OR PAIN  UNUSUAL VAGINAL DISCHARGE OR ITCHING   Items with * indicate a potential emergency and should be followed up as soon as possible or go to the Emergency Department if any problems should occur.  Please show the CHEMOTHERAPY ALERT CARD or IMMUNOTHERAPY ALERT CARD at  check-in to the Emergency Department and triage nurse.  Should you have questions after your visit or need to cancel or reschedule your appointment, please contact [facility]  Dept: [phone]  and follow the prompts.  Office hours are 8:00 a.m. to 4:30 p.m. Monday - Friday. Please note that voicemails left after 4:00 p.m. may not be returned until the following business day.  We are closed weekends and major holidays. You have access to a nurse at all times for urgent questions. Please call the main number to the clinic Dept: [phone] and follow the prompts.   For any non-urgent questions, you may also contact your provider using MyChart. We now offer e-Visits for anyone 18 and older to request care online for non-urgent symptoms. For details visit [email or web address].   Also download the MyChart app! Go to the app store, search "MyChart", open the app, select [facility], and log in with your MyChart username and password.   

## 2022-08-24 NOTE — Assessment & Plan Note (Addendum)
[name] is a 67 year old woman with metastatic breast cancer here today for follow-up and evaluation prior to receiving every 3-week pembrolizumab.  She continues to tolerate the pembrolizumab well.  She has no clinical signs of metastatic breast cancer progression.  She will proceed with treatment today.  Her CBC is normal.  Her c-Met has not resulted in the computer however I am looking at results that were printed off and they demonstrate a sodium of 140, potassium of 4.3, chloride 106, alk phos 69, ALT 16, AST 22, BUN 26, creatinine 0.8, total bilirubin 0.8.  She is okay to treat with these levels.  Constipation.  She is going to continue taking docusate daily which helps manage her bowels and keep them moving.  We also discussed drinking plenty of fluids.  [name] is scheduled to undergo PET scan on August 30, 2022 at 10 AM.  I reviewed this appointment with her and she verified she knows not to have anything to eat or drink 6 hours prior but she can have water.  [name] will return to clinic in 3 weeks for labs, follow-up with Dr. Al Pimple, and her next treatment.

## 2022-08-24 NOTE — Progress Notes (Signed)
ALT  16 ;  AST   22 ;  Bil  0.8  today .

## 2022-08-26 LAB — CA 125: Cancer Antigen (CA) 125: 9.4 U/mL (ref 0.0–38.1)

## 2022-08-30 ENCOUNTER — Encounter (HOSPITAL_COMMUNITY)
Admission: RE | Admit: 2022-08-30 | Discharge: 2022-08-30 | Disposition: A | Discharge status: Home / Self Care | Payer: PPO | Source: Ambulatory Visit | Attending: Hematology and Oncology | Admitting: Hematology and Oncology

## 2022-08-30 DIAGNOSIS — C799 Secondary malignant neoplasm of unspecified site: Secondary | ICD-10-CM | POA: Diagnosis present

## 2022-08-30 LAB — GLUCOSE, CAPILLARY: Glucose-Capillary: 120 mg/dL — ABNORMAL HIGH (ref 70–99)

## 2022-08-30 MED ORDER — FLUDEOXYGLUCOSE F - 18 (FDG) INJECTION
7.5000 | Freq: Once | INTRAVENOUS | Status: AC | PRN
  Administered 2022-08-30: 7.37 via INTRAVENOUS

## 2022-08-31 ENCOUNTER — Other Ambulatory Visit: Payer: Self-pay

## 2022-09-14 ENCOUNTER — Inpatient Hospital Stay: Payer: PPO | Attending: Hematology and Oncology | Admitting: Hematology and Oncology

## 2022-09-14 ENCOUNTER — Inpatient Hospital Stay: Payer: PPO

## 2022-09-14 ENCOUNTER — Other Ambulatory Visit: Payer: Self-pay

## 2022-09-14 VITALS — BP 110/79 | HR 90 | Resp 16

## 2022-09-14 DIAGNOSIS — Z5112 Encounter for antineoplastic immunotherapy: Secondary | ICD-10-CM | POA: Diagnosis present

## 2022-09-14 DIAGNOSIS — C799 Secondary malignant neoplasm of unspecified site: Secondary | ICD-10-CM

## 2022-09-14 DIAGNOSIS — C50919 Malignant neoplasm of unspecified site of unspecified female breast: Secondary | ICD-10-CM | POA: Diagnosis present

## 2022-09-14 DIAGNOSIS — C784 Secondary malignant neoplasm of small intestine: Secondary | ICD-10-CM | POA: Diagnosis present

## 2022-09-14 DIAGNOSIS — Z95828 Presence of other vascular implants and grafts: Secondary | ICD-10-CM

## 2022-09-14 LAB — CMP (CANCER CENTER ONLY)
ALT: 17 U/L (ref 0–44)
AST: 22 U/L (ref 15–41)
Albumin: 4.3 g/dL (ref 3.5–5.0)
Alkaline Phosphatase: 69 U/L (ref 38–126)
Anion gap: 5 (ref 5–15)
BUN: 21 mg/dL (ref 8–23)
CO2: 29 mmol/L (ref 22–32)
Calcium: 9.5 mg/dL (ref 8.9–10.3)
Chloride: 106 mmol/L (ref 98–111)
Creatinine: 0.78 mg/dL (ref 0.44–1.00)
GFR, Estimated: >60 mL/min (ref >60)
Glucose, Bld: 90 mg/dL (ref 70–99)
Potassium: 4.1 mmol/L (ref 3.5–5.1)
Sodium: 140 mmol/L (ref 135–145)
Total Bilirubin: 0.8 mg/dL (ref 0.3–1.2)
Total Protein: 6.6 g/dL (ref 6.5–8.1)

## 2022-09-14 LAB — CBC WITH DIFFERENTIAL (CANCER CENTER ONLY)
Abs Immature Granulocytes: 0.01 10*3/uL (ref 0.00–0.07)
Basophils Absolute: 0 10*3/uL (ref 0.0–0.1)
Basophils Relative: 1 %
Eosinophils Absolute: 0 10*3/uL (ref 0.0–0.5)
Eosinophils Relative: 1 %
HCT: 38.6 % (ref 36.0–46.0)
Hemoglobin: 13.2 g/dL (ref 12.0–15.0)
Immature Granulocytes: 0 %
Lymphocytes Relative: 32 %
Lymphs Abs: 1.3 10*3/uL (ref 0.7–4.0)
MCH: 32.8 pg (ref 26.0–34.0)
MCHC: 34.2 g/dL (ref 30.0–36.0)
MCV: 96 fL (ref 80.0–100.0)
Monocytes Absolute: 0.4 10*3/uL (ref 0.1–1.0)
Monocytes Relative: 11 %
Neutro Abs: 2.3 10*3/uL (ref 1.7–7.7)
Neutrophils Relative %: 55 %
Platelet Count: 220 10*3/uL (ref 150–400)
RBC: 4.02 MIL/uL (ref 3.87–5.11)
RDW: 12.4 % (ref 11.5–15.5)
WBC Count: 4.1 10*3/uL (ref 4.0–10.5)
nRBC: 0 % (ref 0.0–0.2)

## 2022-09-14 MED ORDER — HEPARIN SOD (PORK) LOCK FLUSH 100 UNIT/ML IV SOLN
500.0000 [IU] | Freq: Once | INTRAVENOUS | Status: AC | PRN
  Administered 2022-09-14: 500 [IU]

## 2022-09-14 MED ORDER — SODIUM CHLORIDE 0.9% FLUSH
10.0000 mL | Freq: Once | INTRAVENOUS | Status: AC
  Administered 2022-09-14: 10 mL

## 2022-09-14 MED ORDER — SODIUM CHLORIDE 0.9 % IV SOLN: Freq: Once | INTRAVENOUS | Status: AC

## 2022-09-14 MED ORDER — SODIUM CHLORIDE 0.9 % IV SOLN
200.0000 mg | Freq: Once | INTRAVENOUS | Status: AC
  Administered 2022-09-14: 200 mg via INTRAVENOUS
  Filled 2022-09-14: qty 200

## 2022-09-14 MED ORDER — SODIUM CHLORIDE 0.9% FLUSH
10.0000 mL | INTRAVENOUS | Status: DC | PRN
  Administered 2022-09-14: 10 mL

## 2022-09-14 NOTE — Progress Notes (Signed)
[name] Cancer Follow up:    Caroline Inch, MD [address] [place] [address]   DIAGNOSIS:  Cancer Staging  No matching staging information was found for the patient.    SUMMARY OF ONCOLOGIC HISTORY: Oncology History  Metastatic carcinoma (HCC)  10/2021 Genetic Testing   Negative. 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  Genes tested: AIP, ALK, APC, ATM, AXIN2, BAP1, BARD1, BLM, BMPR1A, BRCA1, BRCA2, BRIP1, CDC73, CDH1, CDK4, CDKN1B, CDKN2A, CHEK2, CTNNA1, DICER1, FANCC, FH, FLCN, GALNT12, KIF1B, LZTR1, MAX, MEN1, MET, MLH1, MSH2, MSH3, MSH6, MUTYH, NBN, NF1, NF2, NTHL1, PALB2, PHOX2B, PMS2, POT1, PRKAR1A, PTCH1, PTEN, RAD51C, RAD51D, RB1, RECQL, RET, SDHA, SDHAF2, SDHB, SDHC, SDHD, SMAD4, SMARCA4, SMARCB1, SMARCE1, STK11, SUFU, TMEM127, TP53, TSC1, TSC2, VHL and XRCC2 (sequencing and deletion/duplication); EGFR, EGLN1, HOXB13, KIT, MITF, PDGFRA, POLD1, and POLE (sequencing only); EPCAM and GREM1 (deletion/duplication only).    04/08/2022 Initial Biopsy   [facility] Enteroscopy and small bowel mass biopsy: The enteroscopy showed normal esophagus, stomach and examined duodenum.  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  Histology showed sheets to partially nested neoplastic cells with expression of CAM5.2 and GATA3, negative for CDX2, could represent metastatic breast cancer.  ER negative, PR negative, HER2 2+, FISH negative.    04/14/2022 Initial Biopsy   Omental biopsy: benign omental tissue   05/02/2022 PET scan   IMPRESSION: 1. There are 2 short segments of small bowel with mild wall thickening and increased radiotracer uptake within the left upper quadrant of the abdomen and lower pelvis. Findings may represent known small-bowel metastasis and/or additional sites of disease. 2. No tracer avid nodal metastasis or solid organ metastasis. 3.  Aortic  Atherosclerosis (ICD10-I70.0).     Electronically Signed   By: Signa Kell M.D.   On: 05/02/2022 09:49   05/17/2022 Surgery   Small bowel resection: Poorly differentiated carcinoma, 3.7cm; margins negative and 5.3cm margins negative, 1LN negative for cancer, likely breast primary   05/17/2022 Pathology Results   Foundation 1: PD-L1 CPS 60, PI3KCA mutation   06/22/2022 -  Chemotherapy   Patient is on Treatment Plan : BREAST Pembrolizumab (200) q21d x 24 months       CURRENT THERAPY: Keytruda  INTERVAL HISTORY:  [name] 67 y.o. female returns for follow-up and evaluation prior to receiving Keytruda.  She is doing well today.    Since her last visit, she had a PET imaging.   She was wondering about her heart which commented on cardiomegaly.  Otherwise she has been tolerating immunotherapy extremely well.  She denies any complaints.  She has a few trips planned, she is flying to visit her daughter in [place] for about 6 weeks.  She has noticed some mild arthralgias with Keytruda but none that are too bothersome.  No change in breathing, bowel habits or urinary habits.  No new neurological complaints. Rest of the pertinent 10 point ROS reviewed and negative   Patient Active Problem List   Diagnosis Date Noted   Port-A-Cath in place 07/13/2022   Metastatic carcinoma (HCC) 06/06/2022   Small bowel mass 05/17/2022   SBO (small bowel obstruction) (HCC) 03/31/2022   Genetic testing 11/10/2021   Family history of breast cancer 10/27/2021   Family history of colon cancer 10/27/2021   S/P breast reconstruction, bilateral 09/30/2019   S/P mastectomy, bilateral 04/13/2019   Weight gain 09/09/2013   Encounter for counseling  11/25/2012   Screening for malignant neoplasm of the cervix 11/25/2012   Menopause 10/08/2012   Unspecified vitamin D deficiency 10/08/2012   Hyperlipemia 10/08/2012   Osteoarthritis 06/04/2012    has No Known Allergies.  MEDICAL HISTORY: Past  Medical History:  Diagnosis Date   Arthritis    Backache, unspecified    Cancer (HCC)    breast s/p BL mastectomies   Colon polyps    Family history of breast cancer 10/27/2021   Family history of colon cancer 10/27/2021   Hyperlipidemia    Mitral valve prolapse of mother during pregnancy    PONV (postoperative nausea and vomiting)     SURGICAL HISTORY: Past Surgical History:  Procedure Laterality Date   BOWEL RESECTION N/A 05/17/2022   Procedure: SMALL BOWEL RESECTION;  Surgeon: Quentin Ore, MD;  Location: [facility];  Service: General;  Laterality: N/A;   BREAST RECONSTRUCTION WITH PLACEMENT OF TISSUE EXPANDER AND FLEX HD (ACELLULAR HYDRATED DERMIS) Bilateral 04/05/2019   Procedure: BILATERAL BREAST RECONSTRUCTION WITH PLACEMENT OF TISSUE EXPANDER AND FLEX HD (ACELLULAR HYDRATED DERMIS);  Surgeon: Peggye Form, DO;  Location: [facility];  Service: Plastics;  Laterality: Bilateral;   ENDOMETRIAL ABLATION  1998   GIVENS CAPSULE STUDY N/A 12/04/2021   Procedure: GIVENS CAPSULE STUDY;  Surgeon: Charna Elizabeth, MD;  Location: [facility];  Service: Gastroenterology;  Laterality: N/A;   IR IMAGING GUIDED PORT INSERTION  07/12/2022   KNEE SURGERY Right 2007   LAPAROSCOPY N/A 04/14/2022   Procedure: LAPAROSCOPY DIAGNOSTIC, G-TUBE PLACEMENT; OMENTUM BIOPSY; LYSIS OF ADHESIONS;  Surgeon: Quentin Ore, MD;  Location: [facility];  Service: General;  Laterality: N/A;   LIPOSUCTION WITH LIPOFILLING Bilateral 01/30/2020   Procedure: LIPOSUCTION WITH LIPOFILLING;  Surgeon: Peggye Form, DO;  Location: [facility];  Service: Plastics;  Laterality: Bilateral;  90 min, please   MASTECTOMY W/ SENTINEL NODE BIOPSY Bilateral 04/05/2019   Procedure: BILATERAL MASTECTOMIES WITH RIGHT SENTINEL LYMPH NODE BIOPSY;  Surgeon: Griselda Miner, MD;  Location: [facility];  Service: General;  Laterality: Bilateral;   REMOVAL OF BILATERAL TISSUE EXPANDERS  WITH PLACEMENT OF BILATERAL BREAST IMPLANTS Bilateral 09/26/2019   Procedure: REMOVAL OF BILATERAL TISSUE EXPANDERS WITH PLACEMENT OF BILATERAL BREAST IMPLANTS;  Surgeon: Peggye Form, DO;  Location: [facility];  Service: Plastics;  Laterality: Bilateral;   RHINOPLASTY  1977   TONSILLECTOMY  1961    SOCIAL HISTORY: Social History   Socioeconomic History   Marital status: Married    Spouse name: Actor   Number of children: 3   Years of education: 16+   Highest education level: Not on file  Occupational History    Employer: [employer]   Occupation: Herbalist  Tobacco Use   Smoking status: Never   Smokeless tobacco: Never  Vaping Use   Vaping status: Never Used  Substance and Sexual Activity   Alcohol use: Not Currently    Comment: 2-3/week   Drug use: No   Sexual activity: Yes    Partners: Male  Other Topics Concern   Not on file  Social History Narrative   Marital Status:  Married Midwife)    Children:  G3 P3 Daughters (Lauren, Valma Cava, [place])    Pets:  Poodle (Mollie); Cats Billey Gosling, Delorise Shiner)    Living Situation: Lives with spouse.  Her daughters live in [place].    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[employer])  Education:  Master's Degree (Dental Hygiene)    Tobacco Use/Exposure:  None    Alcohol Use:  Occasional   Drug Use:  None   Diet:  Regular   Exercise:  Walking   Hobbies:  Gardening, Film/video editor              Social Determinants of Health   Financial Resource Strain: Low Risk  (04/07/2022)   Received from [facility], [facility]   Overall Financial Resource Strain (CARDIA)    Difficulty of Paying Living Expenses: Not hard at all  Food Insecurity: No Food Insecurity (04/13/2022)   Hunger Vital Sign    Worried About Running Out of Food in the Last Year: Never true    Ran Out of Food in the Last Year: Never true  Transportation Needs: No Transportation Needs  (04/13/2022)   PRAPARE - Administrator, Civil Service (Medical): No    Lack of Transportation (Non-Medical): No  Physical Activity: Sufficiently Active (04/07/2022)   Received from [facility], [facility]   Exercise Vital Sign    Days of Exercise per Week: 4 days    Minutes of Exercise per Session: 60 min  Stress: Stress Concern Present (04/07/2022)   Received from [facility], [facility] of Occupational Health - Occupational Stress Questionnaire    Feeling of Stress : To some extent  Social Connections: Socially Integrated (04/07/2022)   Received from [facility], [facility]   Social Network    How would you rate your social network (family, work, friends)?: Good participation with social networks  Intimate Partner Violence: Not At Risk (04/13/2022)   Humiliation, Afraid, Rape, and Kick questionnaire    Fear of Current or Ex-Partner: No    Emotionally Abused: No    Physically Abused: No    Sexually Abused: No    FAMILY HISTORY: Family History  Problem Relation Age of Onset   Arthritis Mother    Colon cancer Mother 57   Melanoma Father        dx 86s; nose   Lung cancer Paternal Uncle        dx after 43; smoking hx   Breast cancer Maternal Grandmother        dx 55s   Hyperlipidemia Paternal Grandmother    Cancer Paternal Grandfather        unknown type; d. before 33   Colon cancer Other        MGM's mother    Review of Systems  Constitutional:  Negative for appetite change, chills, fatigue, fever and unexpected weight change.  HENT:   Negative for hearing loss, lump/mass and trouble swallowing.   Eyes:  Negative for eye problems and icterus.  Respiratory:  Negative for chest tightness, cough and shortness of breath.   Cardiovascular:  Negative for chest pain, leg swelling and palpitations.  Gastrointestinal:  Negative for abdominal distention, abdominal pain, constipation, diarrhea, nausea and vomiting.  Endocrine: Negative for  hot flashes.  Genitourinary:  Negative for difficulty urinating.   Musculoskeletal:  Negative for arthralgias.  Skin:  Negative for itching and rash.  Neurological:  Negative for dizziness, extremity weakness, headaches and numbness.  Hematological:  Negative for adenopathy. Does not bruise/bleed easily.  Psychiatric/Behavioral:  Negative for depression. The patient is not nervous/anxious.       PHYSICAL EXAMINATION    Vitals:   09/14/22 0944  BP: (!) 131/96  Pulse: 81  Resp: 16  Temp: 97.7 F (36.5 C)  SpO2:  98%   Physical Exam Constitutional:      General: She is not in acute distress.    Appearance: Normal appearance. She is not toxic-appearing.  HENT:     Head: Normocephalic and atraumatic.  Eyes:     General: No scleral icterus. Cardiovascular:     Rate and Rhythm: Normal rate and regular rhythm.     Pulses: Normal pulses.     Heart sounds: Normal heart sounds.  Pulmonary:     Effort: Pulmonary effort is normal.     Breath sounds: Normal breath sounds.  Chest:     Comments: She is status post bilateral mastectomy.  No palpable masses or regional adenopathy. Abdominal:     General: Abdomen is flat. Bowel sounds are normal. There is no distension.     Palpations: Abdomen is soft.     Tenderness: There is no abdominal tenderness.  Musculoskeletal:        General: No swelling.     Cervical back: Neck supple.  Lymphadenopathy:     Cervical: No cervical adenopathy.  Skin:    General: Skin is warm and dry.     Findings: No rash.  Neurological:     General: No focal deficit present.     Mental Status: She is alert.  Psychiatric:        Mood and Affect: Mood normal.        Behavior: Behavior normal.     LABORATORY DATA:  CBC    Component Value Date/Time   WBC 4.1 09/14/2022 0909   WBC 11.5 (H) 05/18/2022 0510   RBC 4.02 09/14/2022 0909   HGB 13.2 09/14/2022 0909   HCT 38.6 09/14/2022 0909   PLT 220 09/14/2022 0909   MCV 96.0 09/14/2022 0909   MCH  32.8 09/14/2022 0909   MCHC 34.2 09/14/2022 0909   RDW 12.4 09/14/2022 0909   LYMPHSABS 1.3 09/14/2022 0909   MONOABS 0.4 09/14/2022 0909   EOSABS 0.0 09/14/2022 0909   BASOSABS 0.0 09/14/2022 0909    CMP     Component Value Date/Time   NA 140 09/14/2022 0909   K 4.1 09/14/2022 0909   CL 106 09/14/2022 0909   CO2 29 09/14/2022 0909   GLUCOSE 90 09/14/2022 0909   BUN 21 09/14/2022 0909   CREATININE 0.78 09/14/2022 0909   CREATININE 0.66 08/28/2012 1400   CALCIUM 9.5 09/14/2022 0909   PROT 6.6 09/14/2022 0909   ALBUMIN 4.3 09/14/2022 0909   AST 22 09/14/2022 0909   ALT 17 09/14/2022 0909   ALKPHOS 69 09/14/2022 0909   BILITOT 0.8 09/14/2022 0909   GFRNONAA >60 09/14/2022 0909   GFRNONAA >89 08/28/2012 1400   GFRAA >60 01/17/2019 1301   GFRAA >89 08/28/2012 1400       ASSESSMENT and THERAPY PLAN:   Metastatic carcinoma (HCC) ASSESSMENT & PLAN:    This is a very pleasant 67 year old postmenopausal female patient with 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   It is highly unusual to see a microinvasive breast cancer and predominantly DCIS with negative margins negative lymph nodes to present with metastatic disease. Also the location of her disease seems to be highly unusual for breast cancer metastasis. Given poorly differentiated  carcinoma, I spoke to [facility] pathology team who suggested that the carcinoma has not arisen in small bowel, however it was very poorly differentiated and given her history of breast cancer they wondered if this could be related to breast cancer.  The pathologist is unable to assist with primary and  suggested that we correlate clinically.   Outpatient PET/CT showed 2 short segments of small bowel with mild wall thickening and increased radiotracer uptake within the left upper quadrant of abdominal and lower pelvis.  Findings may represent known small bowel metastasis or additional sites of disease   She underwent surgical resection, two small bowel masses, neg margins, once again path with metastatic breast cancer likely, triple neg. Foundation one showed CPS 60%, PIK3CA mutation, no other targets. We have discussed about considering immunotherapy maintenance for at least a year and periodic imaging.  She is now on Keytruda every 3 weeks and has been tolerating it remarkably well.  I had made some adjustments to the doses and August and October.   Her most recent imaging is without any evidence of metastatic disease.  She has noticed some cardiomegaly reported hence we have ordered an echocardiogram.  She is clinically asymptomatic.  Will follow-up on the echo results and make any referrals to cardiology as needed.  All questions were answered. The patient knows to call the clinic with any problems, questions or concerns. We can certainly see the patient much sooner if necessary.  Total encounter time:30 minutes*in face-to-face visit time, chart review, lab review, care coordination, order entry, and documentation of the encounter time.  *Total Encounter Time as defined by the Centers for Medicare and Medicaid Services includes, in addition to the face-to-face time of a patient visit (documented in the note above) non-face-to-face time: obtaining and reviewing outside history, ordering and reviewing medications, tests or procedures, care coordination (communications with other health care professionals or caregivers) and documentation in the medical record.

## 2022-09-14 NOTE — Patient Instructions (Signed)
[facility]   Discharge Instructions: Thank you for choosing [facility] to provide your oncology and hematology care.   If you have a lab appointment with the Cancer Center, please go directly to the Cancer Center and check in at the registration area.   Wear comfortable clothing and clothing appropriate for easy access to any Portacath or PICC line.   We strive to give you quality time with your provider. You may need to reschedule your appointment if you arrive late (15 or more minutes).  Arriving late affects you and other patients whose appointments are after yours.  Also, if you miss three or more appointments without notifying the office, you may be dismissed from the clinic at the provider's discretion.      For prescription refill requests, have your pharmacy contact our office and allow 72 hours for refills to be completed.    Today you received the following chemotherapy and/or immunotherapy agents: Pembrolizumab (Keytruda)       To help prevent nausea and vomiting after your treatment, we encourage you to take your nausea medication as directed.  BELOW ARE SYMPTOMS THAT SHOULD BE REPORTED IMMEDIATELY: *FEVER GREATER THAN 100.4 F (38 C) OR HIGHER *CHILLS OR SWEATING *NAUSEA AND VOMITING THAT IS NOT CONTROLLED WITH YOUR NAUSEA MEDICATION *UNUSUAL SHORTNESS OF BREATH *UNUSUAL BRUISING OR BLEEDING *URINARY PROBLEMS (pain or burning when urinating, or frequent urination) *BOWEL PROBLEMS (unusual diarrhea, constipation, pain near the anus) TENDERNESS IN MOUTH AND THROAT WITH OR WITHOUT PRESENCE OF ULCERS (sore throat, sores in mouth, or a toothache) UNUSUAL RASH, SWELLING OR PAIN  UNUSUAL VAGINAL DISCHARGE OR ITCHING   Items with * indicate a potential emergency and should be followed up as soon as possible or go to the Emergency Department if any problems should occur.  Please show the CHEMOTHERAPY ALERT CARD or IMMUNOTHERAPY  ALERT CARD at check-in to the Emergency Department and triage nurse.  Should you have questions after your visit or need to cancel or reschedule your appointment, please contact [facility]  Dept: [phone]  and follow the prompts.  Office hours are 8:00 a.m. to 4:30 p.m. Monday - Friday. Please note that voicemails left after 4:00 p.m. may not be returned until the following business day.  We are closed weekends and major holidays. You have access to a nurse at all times for urgent questions. Please call the main number to the clinic Dept: [phone] and follow the prompts.   For any non-urgent questions, you may also contact your provider using MyChart. We now offer e-Visits for anyone 18 and older to request care online for non-urgent symptoms. For details visit [email or web address].   Also download the MyChart app! Go to the app store, search "MyChart", open the app, select [facility], and log in with your MyChart username and password.   

## 2022-09-14 NOTE — Assessment & Plan Note (Signed)
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   It is highly unusual to see a microinvasive breast cancer and predominantly DCIS with negative margins negative lymph nodes to present with metastatic disease. Also the location of her disease seems to be highly unusual for breast cancer metastasis. Given poorly differentiated carcinoma, I spoke to [facility] pathology team who suggested that the carcinoma has not arisen in small bowel, however it was very poorly differentiated and given her history of breast cancer they wondered if this could be related to breast cancer.  The pathologist is unable to assist with primary and suggested that we correlate clinically.   Outpatient PET/CT showed 2 short segments of small bowel with mild wall thickening and increased radiotracer uptake within the left upper quadrant of abdominal and lower pelvis.  Findings may represent known small bowel metastasis or additional sites of disease   She underwent surgical resection, two small bowel masses, neg margins, once again path with metastatic breast cancer likely, triple neg. Foundation one showed CPS 60%, PIK3CA mutation, no other targets. We have discussed about considering immunotherapy maintenance for at least a year and periodic imaging.  She is now on Keytruda every 3 weeks and has been tolerating it remarkably well.  I had made some adjustments to the doses and August and October.   Her most recent imaging is without any evidence of metastatic disease.  She has noticed some cardiomegaly  reported hence we have ordered an echocardiogram.  She is clinically asymptomatic.  Will follow-up on the echo results and make any referrals to cardiology as needed.

## 2022-09-15 ENCOUNTER — Other Ambulatory Visit: Payer: Self-pay

## 2022-09-15 LAB — CA 125: Cancer Antigen (CA) 125: 8.7 U/mL (ref 0.0–38.1)

## 2022-09-29 ENCOUNTER — Ambulatory Visit (HOSPITAL_COMMUNITY)
Admission: RE | Admit: 2022-09-29 | Discharge: 2022-09-29 | Disposition: A | Discharge status: Home / Self Care | Payer: PPO | Source: Ambulatory Visit | Attending: Hematology and Oncology | Admitting: Hematology and Oncology

## 2022-09-29 ENCOUNTER — Other Ambulatory Visit: Payer: Self-pay

## 2022-09-29 DIAGNOSIS — E785 Hyperlipidemia, unspecified: Secondary | ICD-10-CM | POA: Diagnosis present

## 2022-09-29 DIAGNOSIS — I517 Cardiomegaly: Secondary | ICD-10-CM | POA: Diagnosis not present

## 2022-09-29 DIAGNOSIS — C189 Malignant neoplasm of colon, unspecified: Secondary | ICD-10-CM | POA: Diagnosis not present

## 2022-09-29 DIAGNOSIS — C50919 Malignant neoplasm of unspecified site of unspecified female breast: Secondary | ICD-10-CM | POA: Insufficient documentation

## 2022-09-29 DIAGNOSIS — C799 Secondary malignant neoplasm of unspecified site: Secondary | ICD-10-CM | POA: Insufficient documentation

## 2022-09-29 DIAGNOSIS — Z0189 Encounter for other specified special examinations: Secondary | ICD-10-CM

## 2022-09-29 LAB — ECHOCARDIOGRAM COMPLETE
Area-P 1/2: 4.02 cm2
Calc EF: 62.5 %
S' Lateral: 2.8 cm
Single Plane A2C EF: 58.7 %
Single Plane A4C EF: 62.3 %

## 2022-09-29 NOTE — Progress Notes (Signed)
  Echocardiogram 2D Echocardiogram has been performed.  [name] 09/29/2022, 12:03 PM

## 2022-09-30 ENCOUNTER — Telehealth: Payer: Self-pay | Admitting: *Deleted

## 2022-09-30 ENCOUNTER — Encounter: Payer: Self-pay | Admitting: Hematology and Oncology

## 2022-09-30 NOTE — Telephone Encounter (Signed)
Pt called to this  RN to state she would like to not do the "double dose' of keytruda on 8/6 as planned but push it to the following treatment 8/22.  Note above dosing was to evaluate tolerance for expected extended trip planned in the future.  This RN called and spoke with Melissa in pharmacy per above who will adjust the patient's doses appropriately.

## 2022-10-05 ENCOUNTER — Encounter: Payer: Self-pay | Admitting: Adult Health

## 2022-10-05 ENCOUNTER — Inpatient Hospital Stay: Payer: PPO | Attending: Hematology and Oncology

## 2022-10-05 ENCOUNTER — Inpatient Hospital Stay (HOSPITAL_BASED_OUTPATIENT_CLINIC_OR_DEPARTMENT_OTHER): Payer: PPO | Admitting: Adult Health

## 2022-10-05 ENCOUNTER — Inpatient Hospital Stay: Payer: PPO

## 2022-10-05 VITALS — BP 122/83 | HR 75

## 2022-10-05 DIAGNOSIS — Z79620 Long term (current) use of immunosuppressive biologic: Secondary | ICD-10-CM | POA: Diagnosis not present

## 2022-10-05 DIAGNOSIS — C799 Secondary malignant neoplasm of unspecified site: Secondary | ICD-10-CM

## 2022-10-05 DIAGNOSIS — Z95828 Presence of other vascular implants and grafts: Secondary | ICD-10-CM

## 2022-10-05 DIAGNOSIS — C50919 Malignant neoplasm of unspecified site of unspecified female breast: Secondary | ICD-10-CM | POA: Insufficient documentation

## 2022-10-05 DIAGNOSIS — C784 Secondary malignant neoplasm of small intestine: Secondary | ICD-10-CM | POA: Diagnosis present

## 2022-10-05 DIAGNOSIS — Z5112 Encounter for antineoplastic immunotherapy: Secondary | ICD-10-CM | POA: Insufficient documentation

## 2022-10-05 LAB — CMP (CANCER CENTER ONLY)
ALT: 14 U/L (ref 0–44)
AST: 19 U/L (ref 15–41)
Albumin: 4.3 g/dL (ref 3.5–5.0)
Alkaline Phosphatase: 78 U/L (ref 38–126)
Anion gap: 5 (ref 5–15)
BUN: 19 mg/dL (ref 8–23)
CO2: 28 mmol/L (ref 22–32)
Calcium: 9.1 mg/dL (ref 8.9–10.3)
Chloride: 107 mmol/L (ref 98–111)
Creatinine: 0.89 mg/dL (ref 0.44–1.00)
GFR, Estimated: >60 mL/min (ref >60)
Glucose, Bld: 106 mg/dL — ABNORMAL HIGH (ref 70–99)
Potassium: 4.2 mmol/L (ref 3.5–5.1)
Sodium: 140 mmol/L (ref 135–145)
Total Bilirubin: 0.6 mg/dL (ref 0.3–1.2)
Total Protein: 6.6 g/dL (ref 6.5–8.1)

## 2022-10-05 LAB — CBC WITH DIFFERENTIAL (CANCER CENTER ONLY)
Abs Immature Granulocytes: 0.02 10*3/uL (ref 0.00–0.07)
Basophils Absolute: 0 10*3/uL (ref 0.0–0.1)
Basophils Relative: 1 %
Eosinophils Absolute: 0 10*3/uL (ref 0.0–0.5)
Eosinophils Relative: 1 %
HCT: 38.5 % (ref 36.0–46.0)
Hemoglobin: 13.3 g/dL (ref 12.0–15.0)
Immature Granulocytes: 0 %
Lymphocytes Relative: 14 %
Lymphs Abs: 1 10*3/uL (ref 0.7–4.0)
MCH: 33 pg (ref 26.0–34.0)
MCHC: 34.5 g/dL (ref 30.0–36.0)
MCV: 95.5 fL (ref 80.0–100.0)
Monocytes Absolute: 0.6 10*3/uL (ref 0.1–1.0)
Monocytes Relative: 8 %
Neutro Abs: 5.8 10*3/uL (ref 1.7–7.7)
Neutrophils Relative %: 76 %
Platelet Count: 204 10*3/uL (ref 150–400)
RBC: 4.03 MIL/uL (ref 3.87–5.11)
RDW: 11.9 % (ref 11.5–15.5)
WBC Count: 7.5 10*3/uL (ref 4.0–10.5)
nRBC: 0 % (ref 0.0–0.2)

## 2022-10-05 LAB — TSH: TSH: 1.074 u[IU]/mL (ref 0.350–4.500)

## 2022-10-05 MED ORDER — HEPARIN SOD (PORK) LOCK FLUSH 100 UNIT/ML IV SOLN
500.0000 [IU] | Freq: Once | INTRAVENOUS | Status: AC | PRN
  Administered 2022-10-05: 500 [IU]

## 2022-10-05 MED ORDER — SODIUM CHLORIDE 0.9% FLUSH
10.0000 mL | INTRAVENOUS | Status: DC | PRN
  Administered 2022-10-05: 10 mL

## 2022-10-05 MED ORDER — SODIUM CHLORIDE 0.9% FLUSH
10.0000 mL | Freq: Once | INTRAVENOUS | Status: AC
  Administered 2022-10-05: 10 mL

## 2022-10-05 MED ORDER — SODIUM CHLORIDE 0.9 % IV SOLN
200.0000 mg | Freq: Once | INTRAVENOUS | Status: AC
  Administered 2022-10-05: 200 mg via INTRAVENOUS
  Filled 2022-10-05: qty 200

## 2022-10-05 MED ORDER — SODIUM CHLORIDE 0.9 % IV SOLN: Freq: Once | INTRAVENOUS | Status: AC

## 2022-10-05 NOTE — Assessment & Plan Note (Signed)
[name] is a 67 year old woman with metastatic breast cancer here today for follow-up and evaluation prior to receiving every 3-week pembrolizumab.  She continues to tolerate the pembrolizumab well.  She has no clinical signs of metastatic breast cancer progression.  Her labs today are stable and she will proceed with treatment with no dose alterations.  We will continue this every 3 weeks.  Her most recent PET scan on July 1 showed no evidence of metastatic disease.  She does note some mild weight gain however has not yet gotten back into her normal activities she plans to do soon.  [name] will return to clinic in 3 weeks for labs, follow-up and her next treatment.

## 2022-10-05 NOTE — Patient Instructions (Signed)
[facility]  Discharge Instructions: Thank you for choosing [facility] to provide your oncology and hematology care.   If you have a lab appointment with the Cancer Center, please go directly to the Cancer Center and check in at the registration area.   Wear comfortable clothing and clothing appropriate for easy access to any Portacath or PICC line.   We strive to give you quality time with your provider. You may need to reschedule your appointment if you arrive late (15 or more minutes).  Arriving late affects you and other patients whose appointments are after yours.  Also, if you miss three or more appointments without notifying the office, you may be dismissed from the clinic at the provider's discretion.      For prescription refill requests, have your pharmacy contact our office and allow 72 hours for refills to be completed.    Today you received the following chemotherapy and/or immunotherapy agent: Pembrolizumab (Keytruda)   To help prevent nausea and vomiting after your treatment, we encourage you to take your nausea medication as directed.  BELOW ARE SYMPTOMS THAT SHOULD BE REPORTED IMMEDIATELY: *FEVER GREATER THAN 100.4 F (38 C) OR HIGHER *CHILLS OR SWEATING *NAUSEA AND VOMITING THAT IS NOT CONTROLLED WITH YOUR NAUSEA MEDICATION *UNUSUAL SHORTNESS OF BREATH *UNUSUAL BRUISING OR BLEEDING *URINARY PROBLEMS (pain or burning when urinating, or frequent urination) *BOWEL PROBLEMS (unusual diarrhea, constipation, pain near the anus) TENDERNESS IN MOUTH AND THROAT WITH OR WITHOUT PRESENCE OF ULCERS (sore throat, sores in mouth, or a toothache) UNUSUAL RASH, SWELLING OR PAIN  UNUSUAL VAGINAL DISCHARGE OR ITCHING   Items with * indicate a potential emergency and should be followed up as soon as possible or go to the Emergency Department if any problems should occur.  Please show the CHEMOTHERAPY ALERT CARD or IMMUNOTHERAPY ALERT  CARD at check-in to the Emergency Department and triage nurse.  Should you have questions after your visit or need to cancel or reschedule your appointment, please contact [facility]  Dept: [phone]  and follow the prompts.  Office hours are 8:00 a.m. to 4:30 p.m. Monday - Friday. Please note that voicemails left after 4:00 p.m. may not be returned until the following business day.  We are closed weekends and major holidays. You have access to a nurse at all times for urgent questions. Please call the main number to the clinic Dept: [phone] and follow the prompts.   For any non-urgent questions, you may also contact your provider using MyChart. We now offer e-Visits for anyone 18 and older to request care online for non-urgent symptoms. For details visit [email or web address].   Also download the MyChart app! Go to the app store, search "MyChart", open the app, select [place], and log in with your MyChart username and password.  Pembrolizumab Injection What is this medication? PEMBROLIZUMAB (PEM broe LIZ ue mab) treats some types of cancer. It works by helping your immune system slow or stop the spread of cancer cells. It is a monoclonal antibody. This medicine may be used for other purposes; ask your health care provider or pharmacist if you have questions. COMMON BRAND NAME(S): Keytruda What should I tell my care team before I take this medication? They need to know if you have any of these conditions: Allogeneic stem cell transplant (uses someone else's stem cells) Autoimmune diseases, such as Crohn disease, ulcerative colitis, lupus History of chest radiation Nervous system problems,   such as Guillain-Barre syndrome, myasthenia gravis Organ transplant An unusual or allergic reaction to pembrolizumab, other medications, foods, dyes, or preservatives Pregnant or trying to get pregnant Breast-feeding How should I use this medication? This  medication is injected into a vein. It is given by your care team in a hospital or clinic setting. A special MedGuide will be given to you before each treatment. Be sure to read this information carefully each time. Talk to your care team about the use of this medication in children. While it may be prescribed for children as young as 6 months for selected conditions, precautions do apply. Overdosage: If you think you have taken too much of this medicine contact a poison control center or emergency room at once. NOTE: This medicine is only for you. Do not share this medicine with others. What if I miss a dose? Keep appointments for follow-up doses. It is important not to miss your dose. Call your care team if you are unable to keep an appointment. What may interact with this medication? Interactions have not been studied. This list may not describe all possible interactions. Give your health care provider a list of all the medicines, herbs, non-prescription drugs, or dietary supplements you use. Also tell them if you smoke, drink alcohol, or use illegal drugs. Some items may interact with your medicine. What should I watch for while using this medication? Your condition will be monitored carefully while you are receiving this medication. You may need blood work while taking this medication. This medication may cause serious skin reactions. They can happen weeks to months after starting the medication. Contact your care team right away if you notice fevers or flu-like symptoms with a rash. The rash may be red or purple and then turn into blisters or peeling of the skin. You may also notice a red rash with swelling of the face, lips, or lymph nodes in your neck or under your arms. Tell your care team right away if you have any change in your eyesight. Talk to your care team if you may be pregnant. Serious birth defects can occur if you take this medication during pregnancy and for 4 months after the last  dose. You will need a negative pregnancy test before starting this medication. Contraception is recommended while taking this medication and for 4 months after the last dose. Your care team can help you find the option that works for you. Do not breastfeed while taking this medication and for 4 months after the last dose. What side effects may I notice from receiving this medication? Side effects that you should report to your care team as soon as possible: Allergic reactions--skin rash, itching, hives, swelling of the face, lips, tongue, or throat Dry cough, shortness of breath or trouble breathing Eye pain, redness, irritation, or discharge with blurry or decreased vision Heart muscle inflammation--unusual weakness or fatigue, shortness of breath, chest pain, fast or irregular heartbeat, dizziness, swelling of the ankles, feet, or hands Hormone gland problems--headache, sensitivity to light, unusual weakness or fatigue, dizziness, fast or irregular heartbeat, increased sensitivity to cold or heat, excessive sweating, constipation, hair loss, increased thirst or amount of urine, tremors or shaking, irritability Infusion reactions--chest pain, shortness of breath or trouble breathing, feeling faint or lightheaded Kidney injury (glomerulonephritis)--decrease in the amount of urine, red or dark brown urine, foamy or bubbly urine, swelling of the ankles, hands, or feet Liver injury--right upper belly pain, loss of appetite, nausea, light-colored stool, dark yellow or brown urine,   yellowing skin or eyes, unusual weakness or fatigue Pain, tingling, or numbness in the hands or feet, muscle weakness, change in vision, confusion or trouble speaking, loss of balance or coordination, trouble walking, seizures Rash, fever, and swollen lymph nodes Redness, blistering, peeling, or loosening of the skin, including inside the mouth Sudden or severe stomach pain, bloody diarrhea, fever, nausea, vomiting Side effects  that usually do not require medical attention (report to your care team if they continue or are bothersome): Bone, joint, or muscle pain Diarrhea Fatigue Loss of appetite Nausea Skin rash This list may not describe all possible side effects. Call your doctor for medical advice about side effects. You may report side effects to FDA at 1-800-FDA-1088. Where should I keep my medication? This medication is given in a hospital or clinic. It will not be stored at home. NOTE: This sheet is a summary. It may not cover all possible information. If you have questions about this medicine, talk to your doctor, pharmacist, or health care provider.  2024 Elsevier/Gold Standard (2021-06-30 00:00:00)   

## 2022-10-05 NOTE — Progress Notes (Signed)
[facility] Cancer Follow up:    Caroline Inch, MD [address] [place] [address]   DIAGNOSIS: Metastatic breast cancer  SUMMARY OF ONCOLOGIC HISTORY: Oncology History  Metastatic carcinoma (HCC)  10/2021 Genetic Testing   Negative. 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  Genes tested: AIP, ALK, APC, ATM, AXIN2, BAP1, BARD1, BLM, BMPR1A, BRCA1, BRCA2, BRIP1, CDC73, CDH1, CDK4, CDKN1B, CDKN2A, CHEK2, CTNNA1, DICER1, FANCC, FH, FLCN, GALNT12, KIF1B, LZTR1, MAX, MEN1, MET, MLH1, MSH2, MSH3, MSH6, MUTYH, NBN, NF1, NF2, NTHL1, PALB2, PHOX2B, PMS2, POT1, PRKAR1A, PTCH1, PTEN, RAD51C, RAD51D, RB1, RECQL, RET, SDHA, SDHAF2, SDHB, SDHC, SDHD, SMAD4, SMARCA4, SMARCB1, SMARCE1, STK11, SUFU, TMEM127, TP53, TSC1, TSC2, VHL and XRCC2 (sequencing and deletion/duplication); EGFR, EGLN1, HOXB13, KIT, MITF, PDGFRA, POLD1, and POLE (sequencing only); EPCAM and GREM1 (deletion/duplication only).    04/08/2022 Initial Biopsy   [facility] Enteroscopy and small bowel mass biopsy: The enteroscopy showed normal esophagus, stomach and examined duodenum.  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  Histology showed sheets to partially nested neoplastic cells with expression of CAM5.2 and GATA3, negative for CDX2, could represent metastatic breast cancer.  ER negative, PR negative, HER2 2+, FISH negative.    04/14/2022 Initial Biopsy   Omental biopsy: benign omental tissue   05/02/2022 PET scan   IMPRESSION: 1. There are 2 short segments of small bowel with mild wall thickening and increased radiotracer uptake within the left upper quadrant of the abdomen and lower pelvis. Findings may represent known small-bowel metastasis and/or additional sites of disease. 2. No tracer avid nodal metastasis or solid organ metastasis. 3.  Aortic Atherosclerosis (ICD10-I70.0).     Electronically  Signed   By: Signa Kell M.D.   On: 05/02/2022 09:49   05/17/2022 Surgery   Small bowel resection: Poorly differentiated carcinoma, 3.7cm; margins negative and 5.3cm margins negative, 1LN negative for cancer, likely breast primary   05/17/2022 Pathology Results   Foundation 1: PD-L1 CPS 60, PI3KCA mutation   06/22/2022 -  Chemotherapy   Patient is on Treatment Plan : BREAST Pembrolizumab (200) q21d x 24 months       CURRENT THERAPY: Keytruda  INTERVAL HISTORY: [name] 67 y.o. female returns for follow-up and evaluation prior to receiving Keytruda.  She is doing moderately well today.  She recently had an echocardiogram on September 29, 2022 that demonstrated left ventricular ejection fraction of 60 to 65%.  She has no concerns today and is overall tolerating her Keytruda well without any issues.    Patient Active Problem List   Diagnosis Date Noted   Port-A-Cath in place 07/13/2022   Metastatic carcinoma (HCC) 06/06/2022   Small bowel mass 05/17/2022   SBO (small bowel obstruction) (HCC) 03/31/2022   Genetic testing 11/10/2021   Family history of breast cancer 10/27/2021   Family history of colon cancer 10/27/2021   S/P breast reconstruction, bilateral 09/30/2019   S/P mastectomy, bilateral 04/13/2019   Weight gain 09/09/2013   Encounter for counseling 11/25/2012   Screening for malignant neoplasm of the cervix 11/25/2012   Menopause 10/08/2012   Unspecified vitamin D deficiency 10/08/2012   Hyperlipemia 10/08/2012   Osteoarthritis 06/04/2012    has No Known Allergies.  MEDICAL HISTORY: Past Medical History:  Diagnosis Date   Arthritis    Backache, unspecified    Cancer (HCC)    breast s/p BL mastectomies   Colon polyps  Family history of breast cancer 10/27/2021   Family history of colon cancer 10/27/2021   Hyperlipidemia    Mitral valve prolapse of mother during pregnancy    PONV (postoperative nausea and vomiting)     SURGICAL HISTORY: Past  Surgical History:  Procedure Laterality Date   BOWEL RESECTION N/A 05/17/2022   Procedure: SMALL BOWEL RESECTION;  Surgeon: Quentin Ore, MD;  Location: [facility];  Service: General;  Laterality: N/A;   BREAST RECONSTRUCTION WITH PLACEMENT OF TISSUE EXPANDER AND FLEX HD (ACELLULAR HYDRATED DERMIS) Bilateral 04/05/2019   Procedure: BILATERAL BREAST RECONSTRUCTION WITH PLACEMENT OF TISSUE EXPANDER AND FLEX HD (ACELLULAR HYDRATED DERMIS);  Surgeon: Peggye Form, DO;  Location: [facility];  Service: Plastics;  Laterality: Bilateral;   ENDOMETRIAL ABLATION  1998   GIVENS CAPSULE STUDY N/A 12/04/2021   Procedure: GIVENS CAPSULE STUDY;  Surgeon: Charna Elizabeth, MD;  Location[facility];  Service: Gastroenterology;  Laterality: N/A;   IR IMAGING GUIDED PORT INSERTION  07/12/2022   KNEE SURGERY Right 2007   LAPAROSCOPY N/A 04/14/2022   Procedure: LAPAROSCOPY DIAGNOSTIC, G-TUBE PLACEMENT; OMENTUM BIOPSY; LYSIS OF ADHESIONS;  Surgeon: Quentin Ore, MD;  Location: [facility];  Service: General;  Laterality: N/A;   LIPOSUCTION WITH LIPOFILLING Bilateral 01/30/2020   Procedure: LIPOSUCTION WITH LIPOFILLING;  Surgeon: Peggye Form, DO;  Location: [facility];  Service: Plastics;  Laterality: Bilateral;  90 min, please   MASTECTOMY W/ SENTINEL NODE BIOPSY Bilateral 04/05/2019   Procedure: BILATERAL MASTECTOMIES WITH RIGHT SENTINEL LYMPH NODE BIOPSY;  Surgeon: Griselda Miner, MD;  Location: [facility];  Service: General;  Laterality: Bilateral;   REMOVAL OF BILATERAL TISSUE EXPANDERS WITH PLACEMENT OF BILATERAL BREAST IMPLANTS Bilateral 09/26/2019   Procedure: REMOVAL OF BILATERAL TISSUE EXPANDERS WITH PLACEMENT OF BILATERAL BREAST IMPLANTS;  Surgeon: Peggye Form, DO;  Location: [facility];  Service: Plastics;  Laterality: Bilateral;   RHINOPLASTY  1977   TONSILLECTOMY  1961    SOCIAL HISTORY: Social History   Socioeconomic  History   Marital status: Married    Spouse name: Actor   Number of children: 3   Years of education: 16+   Highest education level: Not on file  Occupational History    Employer: [employer]   Occupation: Herbalist  Tobacco Use   Smoking status: Never   Smokeless tobacco: Never  Vaping Use   Vaping status: Never Used  Substance and Sexual Activity   Alcohol use: Not Currently    Comment: 2-3/week   Drug use: No   Sexual activity: Yes    Partners: Male  Other Topics Concern   Not on file  Social History Narrative   Marital Status:  Married Midwife)    Children:  G3 P3 Daughters (Lauren, Valma Cava, [place])    Pets:  Poodle (Mollie); Cats Billey Gosling, Delorise Shiner)    Living Situation: Lives with spouse.  Her daughters live in [place].    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[employer])    Education:  Best boy)    Tobacco Use/Exposure:  None    Alcohol Use:  Occasional   Drug Use:  None   Diet:  Regular   Exercise:  Walking   Hobbies:  Gardening, Shopping              Social Determinants of Health   Financial Resource Strain: Low Risk  (04/07/2022)   [facility]   Overall Financial Resource Strain (CARDIA)    Difficulty of Paying Living Expenses: Not hard at all  Food Insecurity: No Food Insecurity (04/13/2022)   Hunger Vital Sign    Worried About Running Out of Food in the Last Year: Never true    Ran Out of Food in the Last Year: Never true  Transportation Needs: No Transportation Needs (04/13/2022)   PRAPARE - Administrator, Civil Service (Medical): No    Lack of Transportation (Non-Medical): No  Physical Activity: Sufficiently Active (04/07/2022)   [facility], [facility]   Exercise Vital Sign    Days of Exercise per Week: 4 days    Minutes of Exercise per Session: 60 min  Stress: Stress Concern Present (04/07/2022)    Received from [facility], [facility] - Occupational Stress Questionnaire    Feeling of Stress : To some extent  Social Connections: Socially Integrated (04/07/2022)   [facility], [facility]   Social Network    How would you rate your social network (family, work, friends)?: Good participation with social networks  Intimate Partner Violence: Not At Risk (04/13/2022)   Humiliation, Afraid, Rape, and Kick questionnaire    Fear of Current or Ex-Partner: No    Emotionally Abused: No    Physically Abused: No    Sexually Abused: No    FAMILY HISTORY: Family History  Problem Relation Age of Onset   Arthritis Mother    Colon cancer Mother 60   Melanoma Father        dx 41s; nose   Lung cancer Paternal Uncle        dx after 59; smoking hx   Breast cancer Maternal Grandmother        dx 29s   Hyperlipidemia Paternal Grandmother    Cancer Paternal Grandfather        unknown type; d. before 36   Colon cancer Other        MGM's mother    Review of Systems  Constitutional:  Negative for appetite change, chills, fatigue, fever and unexpected weight change.  HENT:   Negative for hearing loss, lump/mass and trouble swallowing.   Eyes:  Negative for eye problems and icterus.  Respiratory:  Negative for chest tightness, cough and shortness of breath.   Cardiovascular:  Negative for chest pain, leg swelling and palpitations.  Gastrointestinal:  Negative for abdominal distention, abdominal pain, constipation, diarrhea, nausea and vomiting.  Endocrine: Negative for hot flashes.  Genitourinary:  Negative for difficulty urinating.   Musculoskeletal:  Negative for arthralgias.  Skin:  Negative for itching and rash.  Neurological:  Negative for dizziness, extremity weakness, headaches and numbness.  Hematological:  Negative for adenopathy. Does not bruise/bleed easily.  Psychiatric/Behavioral:  Negative for depression. The patient is  not nervous/anxious.       PHYSICAL EXAMINATION    Vitals:   10/05/22 1325  BP: 110/77  Pulse: 69  Resp: 18  Temp: 98 F (36.7 C)  SpO2: 100%    Physical Exam Constitutional:      General: She is not in acute distress.    Appearance: Normal appearance. She is not toxic-appearing.  HENT:     Head: Normocephalic and atraumatic.     Mouth/Throat:     Mouth: Mucous membranes are moist.     Pharynx: Oropharynx is clear. No oropharyngeal exudate or posterior oropharyngeal erythema.  Eyes:  General: No scleral icterus. Cardiovascular:     Rate and Rhythm: Normal rate and regular rhythm.     Pulses: Normal pulses.     Heart sounds: Normal heart sounds.  Pulmonary:     Effort: Pulmonary effort is normal.     Breath sounds: Normal breath sounds.  Abdominal:     General: Abdomen is flat. Bowel sounds are normal. There is no distension.     Palpations: Abdomen is soft.     Tenderness: There is no abdominal tenderness.  Musculoskeletal:        General: No swelling.     Cervical back: Neck supple.  Lymphadenopathy:     Cervical: No cervical adenopathy.  Skin:    General: Skin is warm and dry.     Findings: No rash.  Neurological:     General: No focal deficit present.     Mental Status: She is alert.  Psychiatric:        Mood and Affect: Mood normal.        Behavior: Behavior normal.     LABORATORY DATA:  CBC    Component Value Date/Time   WBC 7.5 10/05/2022 1257   WBC 11.5 (H) 05/18/2022 0510   RBC 4.03 10/05/2022 1257   HGB 13.3 10/05/2022 1257   HCT 38.5 10/05/2022 1257   PLT 204 10/05/2022 1257   MCV 95.5 10/05/2022 1257   MCH 33.0 10/05/2022 1257   MCHC 34.5 10/05/2022 1257   RDW 11.9 10/05/2022 1257   LYMPHSABS 1.0 10/05/2022 1257   MONOABS 0.6 10/05/2022 1257   EOSABS 0.0 10/05/2022 1257   BASOSABS 0.0 10/05/2022 1257    CMP     Component Value Date/Time   NA 140 10/05/2022 1257   K 4.2 10/05/2022 1257   CL 107 10/05/2022 1257   CO2 28  10/05/2022 1257   GLUCOSE 106 (H) 10/05/2022 1257   BUN 19 10/05/2022 1257   CREATININE 0.89 10/05/2022 1257   CREATININE 0.66 08/28/2012 1400   CALCIUM 9.1 10/05/2022 1257   PROT 6.6 10/05/2022 1257   ALBUMIN 4.3 10/05/2022 1257   AST 19 10/05/2022 1257   ALT 14 10/05/2022 1257   ALKPHOS 78 10/05/2022 1257   BILITOT 0.6 10/05/2022 1257   GFRNONAA >60 10/05/2022 1257   GFRNONAA >89 08/28/2012 1400   GFRAA >60 01/17/2019 1301   GFRAA >89 08/28/2012 1400     ASSESSMENT and THERAPY PLAN:   Metastatic carcinoma (HCC) [name] is a 67 year old woman with metastatic breast cancer here today for follow-up and evaluation prior to receiving every 3-week pembrolizumab.  She continues to tolerate the pembrolizumab well.  She has no clinical signs of metastatic breast cancer progression.  Her labs today are stable and she will proceed with treatment with no dose alterations.  We will continue this every 3 weeks.  Her most recent PET scan on July 1 showed no evidence of metastatic disease.  She does note some mild weight gain however has not yet gotten back into her normal activities she plans to do soon.  Junelle will return to clinic in 3 weeks for labs, follow-up and her next treatment.   All questions were answered. The patient knows to call the clinic with any problems, questions or concerns. We can certainly see the patient much sooner if necessary.  Total encounter time:30 minutes*in face-to-face visit time, chart review, lab review, care coordination, order entry, and documentation of the encounter time.    Lillard Anes, NP 10/05/22 2:31 PM Medical Oncology  and Hematology [facility] [address] [place], [place] [address] Tel. [phone]    Fax. [phone]  *Total Encounter Time as defined by the Centers for Medicare and Medicaid Services includes, in addition to the face-to-face time of a patient visit (documented in the note above) non-face-to-face time:  obtaining and reviewing outside history, ordering and reviewing medications, tests or procedures, care coordination (communications with other health care professionals or caregivers) and documentation in the medical record.

## 2022-10-19 ENCOUNTER — Other Ambulatory Visit: Payer: Self-pay

## 2022-10-26 ENCOUNTER — Encounter: Payer: Self-pay | Admitting: Hematology and Oncology

## 2022-10-26 ENCOUNTER — Inpatient Hospital Stay: Payer: PPO

## 2022-10-26 ENCOUNTER — Encounter: Payer: Self-pay | Admitting: Adult Health

## 2022-10-26 ENCOUNTER — Inpatient Hospital Stay: Payer: PPO | Admitting: Adult Health

## 2022-10-26 ENCOUNTER — Other Ambulatory Visit: Payer: Self-pay | Admitting: Hematology and Oncology

## 2022-10-26 VITALS — BP 122/88 | HR 60 | Resp 16

## 2022-10-26 DIAGNOSIS — Z5112 Encounter for antineoplastic immunotherapy: Secondary | ICD-10-CM | POA: Diagnosis not present

## 2022-10-26 DIAGNOSIS — C799 Secondary malignant neoplasm of unspecified site: Secondary | ICD-10-CM

## 2022-10-26 DIAGNOSIS — Z95828 Presence of other vascular implants and grafts: Secondary | ICD-10-CM

## 2022-10-26 LAB — CBC WITH DIFFERENTIAL (CANCER CENTER ONLY)
Abs Immature Granulocytes: 0.01 10*3/uL (ref 0.00–0.07)
Basophils Absolute: 0 10*3/uL (ref 0.0–0.1)
Basophils Relative: 1 %
Eosinophils Absolute: 0.1 10*3/uL (ref 0.0–0.5)
Eosinophils Relative: 2 %
HCT: 37.8 % (ref 36.0–46.0)
Hemoglobin: 12.9 g/dL (ref 12.0–15.0)
Immature Granulocytes: 0 %
Lymphocytes Relative: 38 %
Lymphs Abs: 1.4 10*3/uL (ref 0.7–4.0)
MCH: 33.2 pg (ref 26.0–34.0)
MCHC: 34.1 g/dL (ref 30.0–36.0)
MCV: 97.2 fL (ref 80.0–100.0)
Monocytes Absolute: 0.4 10*3/uL (ref 0.1–1.0)
Monocytes Relative: 11 %
Neutro Abs: 1.8 10*3/uL (ref 1.7–7.7)
Neutrophils Relative %: 48 %
Platelet Count: 223 10*3/uL (ref 150–400)
RBC: 3.89 MIL/uL (ref 3.87–5.11)
RDW: 12.3 % (ref 11.5–15.5)
WBC Count: 3.8 10*3/uL — ABNORMAL LOW (ref 4.0–10.5)
nRBC: 0 % (ref 0.0–0.2)

## 2022-10-26 LAB — CMP (CANCER CENTER ONLY)
ALT: 16 U/L (ref 0–44)
AST: 21 U/L (ref 15–41)
Albumin: 4.2 g/dL (ref 3.5–5.0)
Alkaline Phosphatase: 76 U/L (ref 38–126)
Anion gap: 4 — ABNORMAL LOW (ref 5–15)
BUN: 13 mg/dL (ref 8–23)
CO2: 30 mmol/L (ref 22–32)
Calcium: 9.4 mg/dL (ref 8.9–10.3)
Chloride: 107 mmol/L (ref 98–111)
Creatinine: 0.76 mg/dL (ref 0.44–1.00)
GFR, Estimated: >60 mL/min (ref >60)
Glucose, Bld: 98 mg/dL (ref 70–99)
Potassium: 4.2 mmol/L (ref 3.5–5.1)
Sodium: 141 mmol/L (ref 135–145)
Total Bilirubin: 0.6 mg/dL (ref 0.3–1.2)
Total Protein: 6.4 g/dL — ABNORMAL LOW (ref 6.5–8.1)

## 2022-10-26 MED ORDER — BISACODYL 5 MG PO TBEC: 5.0000 mg | DELAYED_RELEASE_TABLET | Freq: Every day | ORAL | 0 refills | Status: DC | PRN

## 2022-10-26 MED ORDER — FAMOTIDINE 40 MG PO TABS: 40.0000 mg | ORAL_TABLET | Freq: Every day | ORAL | 1 refills | Status: AC | PRN

## 2022-10-26 MED ORDER — SODIUM CHLORIDE 0.9 % IV SOLN: Freq: Once | INTRAVENOUS | Status: AC

## 2022-10-26 MED ORDER — SODIUM CHLORIDE 0.9% FLUSH
10.0000 mL | Freq: Once | INTRAVENOUS | Status: AC
  Administered 2022-10-26: 10 mL

## 2022-10-26 MED ORDER — SODIUM CHLORIDE 0.9% FLUSH
10.0000 mL | INTRAVENOUS | Status: DC | PRN
  Administered 2022-10-26: 10 mL

## 2022-10-26 MED ORDER — SODIUM CHLORIDE 0.9 % IV SOLN
200.0000 mg | Freq: Once | INTRAVENOUS | Status: AC
  Administered 2022-10-26: 200 mg via INTRAVENOUS
  Filled 2022-10-26: qty 200

## 2022-10-26 MED ORDER — HEPARIN SOD (PORK) LOCK FLUSH 100 UNIT/ML IV SOLN
500.0000 [IU] | Freq: Once | INTRAVENOUS | Status: AC | PRN
  Administered 2022-10-26: 500 [IU]

## 2022-10-26 NOTE — Progress Notes (Signed)
[facility] Cancer Follow up:    Caroline Inch, MD [address] [place] [address]   DIAGNOSIS: metastatic breast cancer  SUMMARY OF ONCOLOGIC HISTORY: Oncology History  Metastatic carcinoma (HCC)  10/2021 Genetic Testing   Negative. 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  Genes tested: AIP, ALK, APC, ATM, AXIN2, BAP1, BARD1, BLM, BMPR1A, BRCA1, BRCA2, BRIP1, CDC73, CDH1, CDK4, CDKN1B, CDKN2A, CHEK2, CTNNA1, DICER1, FANCC, FH, FLCN, GALNT12, KIF1B, LZTR1, MAX, MEN1, MET, MLH1, MSH2, MSH3, MSH6, MUTYH, NBN, NF1, NF2, NTHL1, PALB2, PHOX2B, PMS2, POT1, PRKAR1A, PTCH1, PTEN, RAD51C, RAD51D, RB1, RECQL, RET, SDHA, SDHAF2, SDHB, SDHC, SDHD, SMAD4, SMARCA4, SMARCB1, SMARCE1, STK11, SUFU, TMEM127, TP53, TSC1, TSC2, VHL and XRCC2 (sequencing and deletion/duplication); EGFR, EGLN1, HOXB13, KIT, MITF, PDGFRA, POLD1, and POLE (sequencing only); EPCAM and GREM1 (deletion/duplication only).    04/08/2022 Initial Biopsy   [facility] Enteroscopy and small bowel mass biopsy: The enteroscopy showed normal esophagus, stomach and examined duodenum.  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  Histology showed sheets to partially nested neoplastic cells with expression of CAM5.2 and GATA3, negative for CDX2, could represent metastatic breast cancer.  ER negative, PR negative, HER2 2+, FISH negative.    04/14/2022 Initial Biopsy   Omental biopsy: benign omental tissue   05/02/2022 PET scan   IMPRESSION: 1. There are 2 short segments of small bowel with mild wall thickening and increased radiotracer uptake within the left upper quadrant of the abdomen and lower pelvis. Findings may represent known small-bowel metastasis and/or additional sites of disease. 2. No tracer avid nodal metastasis or solid organ metastasis. 3.  Aortic Atherosclerosis (ICD10-I70.0).     Electronically  Signed   By: Signa Kell M.D.   On: 05/02/2022 09:49   05/17/2022 Surgery   Small bowel resection: Poorly differentiated carcinoma, 3.7cm; margins negative and 5.3cm margins negative, 1LN negative for cancer, likely breast primary   05/17/2022 Pathology Results   Foundation 1: PD-L1 CPS 60, PI3KCA mutation   06/22/2022 -  Chemotherapy   Patient is on Treatment Plan : BREAST Pembrolizumab (200) q21d x 24 months       CURRENT THERAPY: Pembrolizumab  INTERVAL HISTORY: [name] 67 y.o. female returns for follow-up prior to receiving treatment with Keytruda.  She is doing well today.  She denies any significant issues such as fever chills rash nausea vomiting bowel changes fatigue or any other concerns.  Patient Active Problem List   Diagnosis Date Noted   Port-A-Cath in place 07/13/2022   Metastatic carcinoma (HCC) 06/06/2022   Small bowel mass 05/17/2022   SBO (small bowel obstruction) (HCC) 03/31/2022   Genetic testing 11/10/2021   Family history of breast cancer 10/27/2021   Family history of colon cancer 10/27/2021   S/P breast reconstruction, bilateral 09/30/2019   S/P mastectomy, bilateral 04/13/2019   Weight gain 09/09/2013   Encounter for counseling 11/25/2012   Screening for malignant neoplasm of the cervix 11/25/2012   Menopause 10/08/2012   Unspecified vitamin D deficiency 10/08/2012   Hyperlipemia 10/08/2012   Osteoarthritis 06/04/2012    has No Known Allergies.  MEDICAL HISTORY: Past Medical History:  Diagnosis Date   Arthritis    Backache, unspecified    Cancer (HCC)    breast s/p BL mastectomies   Colon polyps    Family history of breast cancer 10/27/2021   Family history of colon cancer 10/27/2021   Hyperlipidemia  Mitral valve prolapse of mother during pregnancy    PONV (postoperative nausea and vomiting)     SURGICAL HISTORY: Past Surgical History:  Procedure Laterality Date   BOWEL RESECTION N/A 05/17/2022   Procedure: SMALL  BOWEL RESECTION;  Surgeon: Quentin Ore, MD;  Location: [facility];  Service: General;  Laterality: N/A;   BREAST RECONSTRUCTION WITH PLACEMENT OF TISSUE EXPANDER AND FLEX HD (ACELLULAR HYDRATED DERMIS) Bilateral 04/05/2019   Procedure: BILATERAL BREAST RECONSTRUCTION WITH PLACEMENT OF TISSUE EXPANDER AND FLEX HD (ACELLULAR HYDRATED DERMIS);  Surgeon: Peggye Form, DO;  Location: [facility];  Service: Plastics;  Laterality: Bilateral;   ENDOMETRIAL ABLATION  1998   GIVENS CAPSULE STUDY N/A 12/04/2021   Procedure: GIVENS CAPSULE STUDY;  Surgeon: Charna Elizabeth, MD;  Location: [facility];  Service: Gastroenterology;  Laterality: N/A;   IR IMAGING GUIDED PORT INSERTION  07/12/2022   KNEE SURGERY Right 2007   LAPAROSCOPY N/A 04/14/2022   Procedure: LAPAROSCOPY DIAGNOSTIC, G-TUBE PLACEMENT; OMENTUM BIOPSY; LYSIS OF ADHESIONS;  Surgeon: Quentin Ore, MD;  Location: [facility];  Service: General;  Laterality: N/A;   LIPOSUCTION WITH LIPOFILLING Bilateral 01/30/2020   Procedure: LIPOSUCTION WITH LIPOFILLING;  Surgeon: Peggye Form, DO;  Location: [facility];  Service: Plastics;  Laterality: Bilateral;  90 min, please   MASTECTOMY W/ SENTINEL NODE BIOPSY Bilateral 04/05/2019   Procedure: BILATERAL MASTECTOMIES WITH RIGHT SENTINEL LYMPH NODE BIOPSY;  Surgeon: Griselda Miner, MD;  Location: [facility];  Service: General;  Laterality: Bilateral;   REMOVAL OF BILATERAL TISSUE EXPANDERS WITH PLACEMENT OF BILATERAL BREAST IMPLANTS Bilateral 09/26/2019   Procedure: REMOVAL OF BILATERAL TISSUE EXPANDERS WITH PLACEMENT OF BILATERAL BREAST IMPLANTS;  Surgeon: Peggye Form, DO;  Location: [facility];  Service: Plastics;  Laterality: Bilateral;   RHINOPLASTY  1977   TONSILLECTOMY  1961    SOCIAL HISTORY: Social History   Socioeconomic History   Marital status: Married    Spouse name: Actor   Number of children: 3   Years of  education: 16+   Highest education level: Not on file  Occupational History    Employer: [employer]   Occupation: Herbalist  Tobacco Use   Smoking status: Never   Smokeless tobacco: Never  Vaping Use   Vaping status: Never Used  Substance and Sexual Activity   Alcohol use: Not Currently    Comment: 2-3/week   Drug use: No   Sexual activity: Yes    Partners: Male  Other Topics Concern   Not on file  Social History Narrative   Marital Status:  Married Midwife)    Children:  G3 P3 Daughters (Lauren, Valma Cava, [place])    Pets:  Poodle (Mollie); Cats Billey Gosling, Delorise Shiner)    Living Situation: Lives with spouse.  Her daughters live in [place].    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[employer])    Education:  Best boy)    Tobacco Use/Exposure:  None    Alcohol Use:  Occasional   Drug Use:  None   Diet:  Regular   Exercise:  Walking   Hobbies:  Social research officer, government, Film/video editor              Social Determinants of Health   Financial Resource Strain: Low Risk  (04/07/2022)   Received from [employer], [facility]   Overall Financial Resource Strain (CARDIA)    Difficulty of Paying Living Expenses: Not  hard at all  Food Insecurity: No Food Insecurity (04/13/2022)   Hunger Vital Sign    Worried About Running Out of Food in the Last Year: Never true    Ran Out of Food in the Last Year: Never true  Transportation Needs: No Transportation Needs (04/13/2022)   PRAPARE - Administrator, Civil Service (Medical): No    Lack of Transportation (Non-Medical): No  Physical Activity: Sufficiently Active (04/07/2022)   Received from [facility], [facility]   Exercise Vital Sign    Days of Exercise per Week: 4 days    Minutes of Exercise per Session: 60 min  Stress: Stress Concern Present (04/07/2022)   Received from [facility], [facility] -  Occupational Stress Questionnaire    Feeling of Stress : To some extent  Social Connections: Socially Integrated (04/07/2022)   Received from [facility], [facility]   Social Network    How would you rate your social network (family, work, friends)?: Good participation with social networks  Intimate Partner Violence: Not At Risk (04/13/2022)   Humiliation, Afraid, Rape, and Kick questionnaire    Fear of Current or Ex-Partner: No    Emotionally Abused: No    Physically Abused: No    Sexually Abused: No    FAMILY HISTORY: Family History  Problem Relation Age of Onset   Arthritis Mother    Colon cancer Mother 41   Melanoma Father        dx 51s; nose   Lung cancer Paternal Uncle        dx after 58; smoking hx   Breast cancer Maternal Grandmother        dx 49s   Hyperlipidemia Paternal Grandmother    Cancer Paternal Grandfather        unknown type; d. before 56   Colon cancer Other        MGM's mother    Review of Systems  Constitutional:  Negative for appetite change, chills, fatigue, fever and unexpected weight change.  HENT:   Negative for hearing loss, lump/mass and trouble swallowing.   Eyes:  Negative for eye problems and icterus.  Respiratory:  Negative for chest tightness, cough and shortness of breath.   Cardiovascular:  Negative for chest pain, leg swelling and palpitations.  Gastrointestinal:  Negative for abdominal distention, abdominal pain, constipation, diarrhea, nausea and vomiting.  Endocrine: Negative for hot flashes.  Genitourinary:  Negative for difficulty urinating.   Musculoskeletal:  Negative for arthralgias.  Skin:  Negative for itching and rash.  Neurological:  Negative for dizziness, extremity weakness, headaches and numbness.  Hematological:  Negative for adenopathy. Does not bruise/bleed easily.  Psychiatric/Behavioral:  Negative for depression. The patient is not nervous/anxious.       PHYSICAL EXAMINATION   Onc Performance Status -  10/26/22 1421       ECOG Perf Status   ECOG Perf Status Fully active, able to carry on all pre-disease performance without restriction      KPS SCALE   KPS % SCORE Normal activity with effort, some s/s of disease             Vitals:   10/26/22 1416  BP: (!) 145/90  Pulse: 72  Resp: 16  Temp: (!) 97.5 F (36.4 C)  SpO2: 99%    Physical Exam Constitutional:      General: She is not in acute distress.    Appearance: Normal appearance. She is not toxic-appearing.  HENT:     Head: Normocephalic and atraumatic.     Mouth/Throat:     Mouth: Mucous membranes are moist.     Pharynx: Oropharynx is clear. No oropharyngeal exudate or posterior oropharyngeal erythema.  Eyes:     General: No scleral icterus. Cardiovascular:     Rate and Rhythm: Normal rate and regular rhythm.     Pulses: Normal pulses.     Heart sounds: Normal heart sounds.  Pulmonary:     Effort: Pulmonary effort is normal.     Breath sounds: Normal breath sounds.  Abdominal:     General: Abdomen is flat. Bowel sounds are normal. There is no distension.     Palpations: Abdomen is soft.     Tenderness: There is no abdominal tenderness.  Musculoskeletal:        General: No swelling.     Cervical back: Neck supple.  Lymphadenopathy:     Cervical: No cervical adenopathy.  Skin:    General: Skin is warm and dry.     Findings: No rash.  Neurological:     General: No focal deficit present.     Mental Status: She is alert.  Psychiatric:        Mood and Affect: Mood normal.        Behavior: Behavior normal.     LABORATORY DATA:  CBC    Component Value Date/Time   WBC 3.8 (L) 10/26/2022 1352   WBC 11.5 (H) 05/18/2022 0510   RBC 3.89 10/26/2022 1352   HGB 12.9 10/26/2022 1352   HCT 37.8 10/26/2022 1352   PLT 223 10/26/2022 1352   MCV 97.2 10/26/2022 1352   MCH 33.2 10/26/2022 1352   MCHC 34.1 10/26/2022 1352   RDW 12.3 10/26/2022 1352   LYMPHSABS 1.4 10/26/2022 1352   MONOABS 0.4 10/26/2022  1352   EOSABS 0.1 10/26/2022 1352   BASOSABS 0.0 10/26/2022 1352    CMP     Component Value Date/Time   NA 141 10/26/2022 1352   K 4.2 10/26/2022 1352   CL 107 10/26/2022 1352   CO2 30 10/26/2022 1352   GLUCOSE 98 10/26/2022 1352   BUN 13 10/26/2022 1352   CREATININE 0.76 10/26/2022 1352   CREATININE 0.66 08/28/2012 1400   CALCIUM 9.4 10/26/2022 1352   PROT 6.4 (L) 10/26/2022 1352   ALBUMIN 4.2 10/26/2022 1352   AST 21 10/26/2022 1352   ALT 16 10/26/2022 1352   ALKPHOS 76 10/26/2022 1352   BILITOT 0.6 10/26/2022 1352   GFRNONAA >60 10/26/2022 1352   GFRNONAA >89 08/28/2012 1400   GFRAA >60 01/17/2019 1301   GFRAA >89 08/28/2012 1400        ASSESSMENT and THERAPY PLAN:   Metastatic carcinoma (HCC) [name] is a 67 year old woman with metastatic breast cancer here today for follow-up and evaluation prior to receiving every 3-week pembrolizumab.  She continues to tolerate the pembrolizumab well.  She has no signs of toxicity today and will proceed with treatment as planned at the regular dose.  Her labs today are stable and I reviewed those with her in detail.  [name] has no signs of progression of her metastatic breast cancer.  She is going to [place] for the month of November and needs assistance adjusting her treatment plan.  I reviewed this with Dr. Al Pimple and [name], CPP will adjust her treatment so that she can get a double dose of the Keytruda prior to her going out of the country to [place] so she can skip  that treatment and return on February 15, 2023.  We will see [name] back in 3 weeks for labs, follow-up, and her next treatment.    All questions were answered. The patient knows to call the clinic with any problems, questions or concerns. We can certainly see the patient much sooner if necessary.  Total encounter time:30 minutes*in face-to-face visit time, chart review, lab review, care coordination, order entry, and documentation of the encounter  time.    Lillard Anes, NP 10/26/22 2:43 PM Medical Oncology and Hematology [facility] [address] [place], [place] [address] Tel. [phone]    Fax. [phone]  *Total Encounter Time as defined by the Centers for Medicare and Medicaid Services includes, in addition to the face-to-face time of a patient visit (documented in the note above) non-face-to-face time: obtaining and reviewing outside history, ordering and reviewing medications, tests or procedures, care coordination (communications with other health care professionals or caregivers) and documentation in the medical record.

## 2022-10-26 NOTE — Assessment & Plan Note (Signed)
[name] is a 67 year old woman with metastatic breast cancer here today for follow-up and evaluation prior to receiving every 3-week pembrolizumab.  She continues to tolerate the pembrolizumab well.  She has no signs of toxicity today and will proceed with treatment as planned at the regular dose.  Her labs today are stable and I reviewed those with her in detail.  [name] has no signs of progression of her metastatic breast cancer.  She is going to [place] for the month of November and needs assistance adjusting her treatment plan.  I reviewed this with Dr. Al Pimple and Marylynn Pearson, CPP will adjust her treatment so that she can get a double dose of the Keytruda prior to her going out of the country to [place] so she can skip that treatment and return on February 15, 2023.  We will see [name] back in 3 weeks for labs, follow-up, and her next treatment.

## 2022-10-26 NOTE — Patient Instructions (Signed)
[facility]  Discharge Instructions: Thank you for choosing [facility] to provide your oncology and hematology care.   If you have a lab appointment with the Cancer Center, please go directly to the Cancer Center and check in at the registration area.   Wear comfortable clothing and clothing appropriate for easy access to any Portacath or PICC line.   We strive to give you quality time with your provider. You may need to reschedule your appointment if you arrive late (15 or more minutes).  Arriving late affects you and other patients whose appointments are after yours.  Also, if you miss three or more appointments without notifying the office, you may be dismissed from the clinic at the provider's discretion.      For prescription refill requests, have your pharmacy contact our office and allow 72 hours for refills to be completed.    Today you received the following chemotherapy and/or immunotherapy agent: Pembrolizumab (Keytruda)   To help prevent nausea and vomiting after your treatment, we encourage you to take your nausea medication as directed.  BELOW ARE SYMPTOMS THAT SHOULD BE REPORTED IMMEDIATELY: *FEVER GREATER THAN 100.4 F (38 C) OR HIGHER *CHILLS OR SWEATING *NAUSEA AND VOMITING THAT IS NOT CONTROLLED WITH YOUR NAUSEA MEDICATION *UNUSUAL SHORTNESS OF BREATH *UNUSUAL BRUISING OR BLEEDING *URINARY PROBLEMS (pain or burning when urinating, or frequent urination) *BOWEL PROBLEMS (unusual diarrhea, constipation, pain near the anus) TENDERNESS IN MOUTH AND THROAT WITH OR WITHOUT PRESENCE OF ULCERS (sore throat, sores in mouth, or a toothache) UNUSUAL RASH, SWELLING OR PAIN  UNUSUAL VAGINAL DISCHARGE OR ITCHING   Items with * indicate a potential emergency and should be followed up as soon as possible or go to the Emergency Department if any problems should occur.  Please show the CHEMOTHERAPY ALERT CARD or IMMUNOTHERAPY ALERT  CARD at check-in to the Emergency Department and triage nurse.  Should you have questions after your visit or need to cancel or reschedule your appointment, please contact [facility]  Dept: [phone]  and follow the prompts.  Office hours are 8:00 a.m. to 4:30 p.m. Monday - Friday. Please note that voicemails left after 4:00 p.m. may not be returned until the following business day.  We are closed weekends and major holidays. You have access to a nurse at all times for urgent questions. Please call the main number to the clinic Dept: [phone] and follow the prompts.   For any non-urgent questions, you may also contact your provider using MyChart. We now offer e-Visits for anyone 18 and older to request care online for non-urgent symptoms. For details visit [email or web address].   Also download the MyChart app! Go to the app store, search "MyChart", open the app, select [place], and log in with your MyChart username and password.  Pembrolizumab Injection What is this medication? PEMBROLIZUMAB (PEM broe LIZ ue mab) treats some types of cancer. It works by helping your immune system slow or stop the spread of cancer cells. It is a monoclonal antibody. This medicine may be used for other purposes; ask your health care provider or pharmacist if you have questions. COMMON BRAND NAME(S): Keytruda What should I tell my care team before I take this medication? They need to know if you have any of these conditions: Allogeneic stem cell transplant (uses someone else's stem cells) Autoimmune diseases, such as Crohn disease, ulcerative colitis, lupus History of chest radiation Nervous system problems,   such as Guillain-Barre syndrome, myasthenia gravis Organ transplant An unusual or allergic reaction to pembrolizumab, other medications, foods, dyes, or preservatives Pregnant or trying to get pregnant Breast-feeding How should I use this medication? This  medication is injected into a vein. It is given by your care team in a hospital or clinic setting. A special MedGuide will be given to you before each treatment. Be sure to read this information carefully each time. Talk to your care team about the use of this medication in children. While it may be prescribed for children as young as 6 months for selected conditions, precautions do apply. Overdosage: If you think you have taken too much of this medicine contact a poison control center or emergency room at once. NOTE: This medicine is only for you. Do not share this medicine with others. What if I miss a dose? Keep appointments for follow-up doses. It is important not to miss your dose. Call your care team if you are unable to keep an appointment. What may interact with this medication? Interactions have not been studied. This list may not describe all possible interactions. Give your health care provider a list of all the medicines, herbs, non-prescription drugs, or dietary supplements you use. Also tell them if you smoke, drink alcohol, or use illegal drugs. Some items may interact with your medicine. What should I watch for while using this medication? Your condition will be monitored carefully while you are receiving this medication. You may need blood work while taking this medication. This medication may cause serious skin reactions. They can happen weeks to months after starting the medication. Contact your care team right away if you notice fevers or flu-like symptoms with a rash. The rash may be red or purple and then turn into blisters or peeling of the skin. You may also notice a red rash with swelling of the face, lips, or lymph nodes in your neck or under your arms. Tell your care team right away if you have any change in your eyesight. Talk to your care team if you may be pregnant. Serious birth defects can occur if you take this medication during pregnancy and for 4 months after the last  dose. You will need a negative pregnancy test before starting this medication. Contraception is recommended while taking this medication and for 4 months after the last dose. Your care team can help you find the option that works for you. Do not breastfeed while taking this medication and for 4 months after the last dose. What side effects may I notice from receiving this medication? Side effects that you should report to your care team as soon as possible: Allergic reactions--skin rash, itching, hives, swelling of the face, lips, tongue, or throat Dry cough, shortness of breath or trouble breathing Eye pain, redness, irritation, or discharge with blurry or decreased vision Heart muscle inflammation--unusual weakness or fatigue, shortness of breath, chest pain, fast or irregular heartbeat, dizziness, swelling of the ankles, feet, or hands Hormone gland problems--headache, sensitivity to light, unusual weakness or fatigue, dizziness, fast or irregular heartbeat, increased sensitivity to cold or heat, excessive sweating, constipation, hair loss, increased thirst or amount of urine, tremors or shaking, irritability Infusion reactions--chest pain, shortness of breath or trouble breathing, feeling faint or lightheaded Kidney injury (glomerulonephritis)--decrease in the amount of urine, red or dark brown urine, foamy or bubbly urine, swelling of the ankles, hands, or feet Liver injury--right upper belly pain, loss of appetite, nausea, light-colored stool, dark yellow or brown urine,   yellowing skin or eyes, unusual weakness or fatigue Pain, tingling, or numbness in the hands or feet, muscle weakness, change in vision, confusion or trouble speaking, loss of balance or coordination, trouble walking, seizures Rash, fever, and swollen lymph nodes Redness, blistering, peeling, or loosening of the skin, including inside the mouth Sudden or severe stomach pain, bloody diarrhea, fever, nausea, vomiting Side effects  that usually do not require medical attention (report to your care team if they continue or are bothersome): Bone, joint, or muscle pain Diarrhea Fatigue Loss of appetite Nausea Skin rash This list may not describe all possible side effects. Call your doctor for medical advice about side effects. You may report side effects to FDA at 1-800-FDA-1088. Where should I keep my medication? This medication is given in a hospital or clinic. It will not be stored at home. NOTE: This sheet is a summary. It may not cover all possible information. If you have questions about this medicine, talk to your doctor, pharmacist, or health care provider.  2024 Elsevier/Gold Standard (2021-06-30 00:00:00)   

## 2022-10-28 LAB — CA 125: Cancer Antigen (CA) 125: 7 U/mL (ref 0.0–38.1)

## 2022-11-16 ENCOUNTER — Inpatient Hospital Stay: Payer: PPO | Attending: Hematology and Oncology

## 2022-11-16 ENCOUNTER — Inpatient Hospital Stay (HOSPITAL_BASED_OUTPATIENT_CLINIC_OR_DEPARTMENT_OTHER): Payer: PPO

## 2022-11-16 ENCOUNTER — Other Ambulatory Visit: Payer: Self-pay

## 2022-11-16 ENCOUNTER — Inpatient Hospital Stay: Payer: PPO | Admitting: Hematology and Oncology

## 2022-11-16 VITALS — BP 114/77 | HR 72 | Temp 97.5°F | Resp 16 | Wt 151.8 lb

## 2022-11-16 DIAGNOSIS — Z5112 Encounter for antineoplastic immunotherapy: Secondary | ICD-10-CM | POA: Diagnosis present

## 2022-11-16 DIAGNOSIS — C50919 Malignant neoplasm of unspecified site of unspecified female breast: Secondary | ICD-10-CM | POA: Diagnosis present

## 2022-11-16 DIAGNOSIS — C799 Secondary malignant neoplasm of unspecified site: Secondary | ICD-10-CM | POA: Diagnosis not present

## 2022-11-16 DIAGNOSIS — C784 Secondary malignant neoplasm of small intestine: Secondary | ICD-10-CM | POA: Diagnosis present

## 2022-11-16 DIAGNOSIS — Z95828 Presence of other vascular implants and grafts: Secondary | ICD-10-CM

## 2022-11-16 LAB — CBC WITH DIFFERENTIAL (CANCER CENTER ONLY)
Abs Immature Granulocytes: 0.01 10*3/uL (ref 0.00–0.07)
Basophils Absolute: 0 10*3/uL (ref 0.0–0.1)
Basophils Relative: 1 %
Eosinophils Absolute: 0 10*3/uL (ref 0.0–0.5)
Eosinophils Relative: 1 %
HCT: 38 % (ref 36.0–46.0)
Hemoglobin: 13.4 g/dL (ref 12.0–15.0)
Immature Granulocytes: 0 %
Lymphocytes Relative: 32 %
Lymphs Abs: 1.4 10*3/uL (ref 0.7–4.0)
MCH: 34.3 pg — ABNORMAL HIGH (ref 26.0–34.0)
MCHC: 35.3 g/dL (ref 30.0–36.0)
MCV: 97.2 fL (ref 80.0–100.0)
Monocytes Absolute: 0.3 10*3/uL (ref 0.1–1.0)
Monocytes Relative: 8 %
Neutro Abs: 2.6 10*3/uL (ref 1.7–7.7)
Neutrophils Relative %: 58 %
Platelet Count: 247 10*3/uL (ref 150–400)
RBC: 3.91 MIL/uL (ref 3.87–5.11)
RDW: 12.2 % (ref 11.5–15.5)
WBC Count: 4.4 10*3/uL (ref 4.0–10.5)
nRBC: 0 % (ref 0.0–0.2)

## 2022-11-16 LAB — CMP (CANCER CENTER ONLY)
ALT: 14 U/L (ref 0–44)
AST: 18 U/L (ref 15–41)
Albumin: 4.4 g/dL (ref 3.5–5.0)
Alkaline Phosphatase: 72 U/L (ref 38–126)
Anion gap: 4 — ABNORMAL LOW (ref 5–15)
BUN: 22 mg/dL (ref 8–23)
CO2: 29 mmol/L (ref 22–32)
Calcium: 9.6 mg/dL (ref 8.9–10.3)
Chloride: 107 mmol/L (ref 98–111)
Creatinine: 0.8 mg/dL (ref 0.44–1.00)
GFR, Estimated: >60 mL/min (ref >60)
Glucose, Bld: 103 mg/dL — ABNORMAL HIGH (ref 70–99)
Potassium: 4.4 mmol/L (ref 3.5–5.1)
Sodium: 140 mmol/L (ref 135–145)
Total Bilirubin: 0.5 mg/dL (ref 0.3–1.2)
Total Protein: 6.7 g/dL (ref 6.5–8.1)

## 2022-11-16 MED ORDER — SODIUM CHLORIDE 0.9% FLUSH
10.0000 mL | INTRAVENOUS | Status: DC | PRN
  Administered 2022-11-16: 10 mL

## 2022-11-16 MED ORDER — SODIUM CHLORIDE 0.9 % IV SOLN: Freq: Once | INTRAVENOUS | Status: AC

## 2022-11-16 MED ORDER — SODIUM CHLORIDE 0.9 % IV SOLN
400.0000 mg | Freq: Once | INTRAVENOUS | Status: AC
  Administered 2022-11-16: 400 mg via INTRAVENOUS
  Filled 2022-11-16: qty 16

## 2022-11-16 MED ORDER — SODIUM CHLORIDE 0.9% FLUSH
10.0000 mL | INTRAVENOUS | Status: DC | PRN
  Administered 2022-11-16: 10 mL via INTRAVENOUS

## 2022-11-16 MED ORDER — HEPARIN SOD (PORK) LOCK FLUSH 100 UNIT/ML IV SOLN
500.0000 [IU] | Freq: Once | INTRAVENOUS | Status: AC | PRN
  Administered 2022-11-16: 500 [IU]

## 2022-11-16 NOTE — Assessment & Plan Note (Signed)
Albertia is a 67 year old woman with metastatic breast cancer here today for follow-up and evaluation prior to receiving every 3-week pembrolizumab. She continues to tolerate the pembrolizumab well.  She has no major toxicities today.  Assessment and Plan    Cancer Treatment Patient is currently on Keytruda. Reported experiencing short-term memory loss and occasional pain at the base of the skull.  Discussed the possibility of these symptoms being side effects of the treatment, such as brain fog and exacerbation of arthritis. -Continue Keytruda treatment. -Plan to increase dose to 400 for the next treatment to accommodate patient's travel schedule. -Check in with patient regarding how she feels on the increased dose to determine future treatment plans.  Travel Plans Patient has upcoming travel to [place] in October and potential travel in February/March for the birth of a grandchild. Discussed adjusting treatment schedule to accommodate these plans. -Next treatment on October 29th with increased dose of 400. -Following treatment will be on December 17th. -Will discuss potential six-week treatment schedule in December to accommodate potential travel in February/March.  Occipital Pain Patient reported occasional pain at the base of the skull. No other neurological symptoms reported. -Advise patient to monitor symptoms and report any changes or additional symptoms.

## 2022-11-16 NOTE — Progress Notes (Signed)
[facility] Cancer Follow up:    Caroline Inch, MD [address] [place] [address]   DIAGNOSIS:  Cancer Staging  No matching staging information was found for the patient.    SUMMARY OF ONCOLOGIC HISTORY: Oncology History  Metastatic carcinoma (HCC)  10/2021 Genetic Testing   Negative. 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  Genes tested: AIP, ALK, APC, ATM, AXIN2, BAP1, BARD1, BLM, BMPR1A, BRCA1, BRCA2, BRIP1, CDC73, CDH1, CDK4, CDKN1B, CDKN2A, CHEK2, CTNNA1, DICER1, FANCC, FH, FLCN, GALNT12, KIF1B, LZTR1, MAX, MEN1, MET, MLH1, MSH2, MSH3, MSH6, MUTYH, NBN, NF1, NF2, NTHL1, PALB2, PHOX2B, PMS2, POT1, PRKAR1A, PTCH1, PTEN, RAD51C, RAD51D, RB1, RECQL, RET, SDHA, SDHAF2, SDHB, SDHC, SDHD, SMAD4, SMARCA4, SMARCB1, SMARCE1, STK11, SUFU, TMEM127, TP53, TSC1, TSC2, VHL and XRCC2 (sequencing and deletion/duplication); EGFR, EGLN1, HOXB13, KIT, MITF, PDGFRA, POLD1, and POLE (sequencing only); EPCAM and GREM1 (deletion/duplication only).    04/08/2022 Initial Biopsy   [facility] Enteroscopy and small bowel mass biopsy: The enteroscopy showed normal esophagus, stomach and examined duodenum.  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  Histology showed sheets to partially nested neoplastic cells with expression of CAM5.2 and GATA3, negative for CDX2, could represent metastatic breast cancer.  ER negative, PR negative, HER2 2+, FISH negative.    04/14/2022 Initial Biopsy   Omental biopsy: benign omental tissue   05/02/2022 PET scan   IMPRESSION: 1. There are 2 short segments of small bowel with mild wall thickening and increased radiotracer uptake within the left upper quadrant of the abdomen and lower pelvis. Findings may represent known small-bowel metastasis and/or additional sites of disease. 2. No tracer avid nodal metastasis or solid organ metastasis. 3.  Aortic  Atherosclerosis (ICD10-I70.0).     Electronically Signed   By: Signa Kell M.D.   On: 05/02/2022 09:49   05/17/2022 Surgery   Small bowel resection: Poorly differentiated carcinoma, 3.7cm; margins negative and 5.3cm margins negative, 1LN negative for cancer, likely breast primary   05/17/2022 Pathology Results   Foundation 1: PD-L1 CPS 60, PI3KCA mutation   06/22/2022 -  Chemotherapy   Patient is on Treatment Plan : BREAST Pembrolizumab (200) q21d x 24 months       CURRENT THERAPY: Keytruda  INTERVAL HISTORY:  [name] 67 y.o. female returns for follow-up and evaluation prior to receiving Keytruda.    Discussed the use of AI scribe software for clinical note transcription with the patient, who gave verbal consent to proceed.  History of Present Illness    She is doing well overall. She today expressed concern regarding some arthralgias, pain in the back of the occiput and some short term memory loss.     The patient denies any significant changes in their health and reports sleeping well. The patient is also planning an extended trip to [place] and [place].  Patient Active Problem List   Diagnosis Date Noted   Port-A-Cath in place 07/13/2022   Metastatic carcinoma (HCC) 06/06/2022   Small bowel mass 05/17/2022   SBO (small bowel obstruction) (HCC) 03/31/2022   Genetic testing 11/10/2021   Family history of breast cancer 10/27/2021   Family history of colon cancer 10/27/2021   S/P breast reconstruction, bilateral 09/30/2019   S/P mastectomy, bilateral 04/13/2019   Weight gain 09/09/2013   Encounter for counseling 11/25/2012   Screening for malignant neoplasm of the cervix 11/25/2012   Menopause 10/08/2012   Unspecified vitamin  D deficiency 10/08/2012   Hyperlipemia 10/08/2012   Osteoarthritis 06/04/2012    has No Known Allergies.  MEDICAL HISTORY: Past Medical History:  Diagnosis Date   Arthritis    Backache, unspecified    Cancer (HCC)     breast s/p BL mastectomies   Colon polyps    Family history of breast cancer 10/27/2021   Family history of colon cancer 10/27/2021   Hyperlipidemia    Mitral valve prolapse of mother during pregnancy    PONV (postoperative nausea and vomiting)     SURGICAL HISTORY: Past Surgical History:  Procedure Laterality Date   BOWEL RESECTION N/A 05/17/2022   Procedure: SMALL BOWEL RESECTION;  Surgeon: Quentin Ore, MD;  Location: [facility];  Service: General;  Laterality: N/A;   BREAST RECONSTRUCTION WITH PLACEMENT OF TISSUE EXPANDER AND FLEX HD (ACELLULAR HYDRATED DERMIS) Bilateral 04/05/2019   Procedure: BILATERAL BREAST RECONSTRUCTION WITH PLACEMENT OF TISSUE EXPANDER AND FLEX HD (ACELLULAR HYDRATED DERMIS);  Surgeon: Peggye Form, DO;  Location: [facility];  Service: Plastics;  Laterality: Bilateral;   ENDOMETRIAL ABLATION  1998   GIVENS CAPSULE STUDY N/A 12/04/2021   Procedure: GIVENS CAPSULE STUDY;  Surgeon: Charna Elizabeth, MD;  Location: [facility];  Service: Gastroenterology;  Laterality: N/A;   IR IMAGING GUIDED PORT INSERTION  07/12/2022   KNEE SURGERY Right 2007   LAPAROSCOPY N/A 04/14/2022   Procedure: LAPAROSCOPY DIAGNOSTIC, G-TUBE PLACEMENT; OMENTUM BIOPSY; LYSIS OF ADHESIONS;  Surgeon: Quentin Ore, MD;  Location: [facility];  Service: General;  Laterality: N/A;   LIPOSUCTION WITH LIPOFILLING Bilateral 01/30/2020   Procedure: LIPOSUCTION WITH LIPOFILLING;  Surgeon: Peggye Form, DO;  Location: [facility];  Service: Plastics;  Laterality: Bilateral;  90 min, please   MASTECTOMY W/ SENTINEL NODE BIOPSY Bilateral 04/05/2019   Procedure: BILATERAL MASTECTOMIES WITH RIGHT SENTINEL LYMPH NODE BIOPSY;  Surgeon: Griselda Miner, MD;  Location: [facility];  Service: General;  Laterality: Bilateral;   REMOVAL OF BILATERAL TISSUE EXPANDERS WITH PLACEMENT OF BILATERAL BREAST IMPLANTS Bilateral 09/26/2019   Procedure: REMOVAL OF  BILATERAL TISSUE EXPANDERS WITH PLACEMENT OF BILATERAL BREAST IMPLANTS;  Surgeon: Peggye Form, DO;  Location: [facility];  Service: Plastics;  Laterality: Bilateral;   RHINOPLASTY  1977   TONSILLECTOMY  1961    SOCIAL HISTORY: Social History   Socioeconomic History   Marital status: Married    Spouse name: Actor   Number of children: 3   Years of education: 16+   Highest education level: Not on file  Occupational History    Employer: [employer]   Occupation: Herbalist  Tobacco Use   Smoking status: Never   Smokeless tobacco: Never  Vaping Use   Vaping status: Never Used  Substance and Sexual Activity   Alcohol use: Not Currently    Comment: 2-3/week   Drug use: No   Sexual activity: Yes    Partners: Male  Other Topics Concern   Not on file  Social History Narrative   Marital Status:  Married Midwife)    Children:  G3 P3 Daughters (Lauren, Valma Cava, [place])    Pets:  Poodle (Mollie); Cats Billey Gosling, Delorise Shiner)    Living Situation: Lives with spouse.  Her daughters live in [place].    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[employer])    Education:  Best boy)    Tobacco Use/Exposure:  None    Alcohol Use:  Occasional   Drug Use:  None   Diet:  Regular   Exercise:  Walking   Hobbies:  Gardening, Film/video editor              Social Determinants of Health   Financial Resource Strain: Low Risk  (04/07/2022)   Received from [facility], [facility]   Overall Financial Resource Strain (CARDIA)    Difficulty of Paying Living Expenses: Not hard at all  Food Insecurity: No Food Insecurity (04/13/2022)   Hunger Vital Sign    Worried About Running Out of Food in the Last Year: Never true    Ran Out of Food in the Last Year: Never true  Transportation Needs: No Transportation Needs (04/13/2022)   PRAPARE - Administrator, Civil Service (Medical): No    Lack  of Transportation (Non-Medical): No  Physical Activity: Sufficiently Active (04/07/2022)   Received from [facility], [facility]   Exercise Vital Sign    Days of Exercise per Week: 4 days    Minutes of Exercise per Session: 60 min  Stress: Stress Concern Present (04/07/2022)   Received from [facility], [facility] of Occupational Health - Occupational Stress Questionnaire    Feeling of Stress : To some extent  Social Connections: Socially Integrated (04/07/2022)   Received from [facility], [facility]   Social Network    How would you rate your social network (family, work, friends)?: Good participation with social networks  Intimate Partner Violence: Not At Risk (04/13/2022)   Humiliation, Afraid, Rape, and Kick questionnaire    Fear of Current or Ex-Partner: No    Emotionally Abused: No    Physically Abused: No    Sexually Abused: No    FAMILY HISTORY: Family History  Problem Relation Age of Onset   Arthritis Mother    Colon cancer Mother 60   Melanoma Father        dx 34s; nose   Lung cancer Paternal Uncle        dx after 30; smoking hx   Breast cancer Maternal Grandmother        dx 3s   Hyperlipidemia Paternal Grandmother    Cancer Paternal Grandfather        unknown type; d. before 50   Colon cancer Other        MGM's mother    Review of Systems  Constitutional:  Negative for appetite change, chills, fatigue, fever and unexpected weight change.  HENT:   Negative for hearing loss, lump/mass and trouble swallowing.   Eyes:  Negative for eye problems and icterus.  Respiratory:  Negative for chest tightness, cough and shortness of breath.   Cardiovascular:  Negative for chest pain, leg swelling and palpitations.  Gastrointestinal:  Negative for abdominal distention, abdominal pain, constipation, diarrhea, nausea and vomiting.  Endocrine: Negative for hot flashes.  Genitourinary:  Negative for difficulty urinating.   Musculoskeletal:   Negative for arthralgias.  Skin:  Negative for itching and rash.  Neurological:  Negative for dizziness, extremity weakness, headaches and numbness.  Hematological:  Negative for adenopathy. Does not bruise/bleed easily.  Psychiatric/Behavioral:  Negative for depression. The patient is not nervous/anxious.       PHYSICAL EXAMINATION    Vitals:   11/16/22 1342  BP: 114/77  Pulse: 72  Resp: 16  Temp: (!) 97.5 F (36.4 C)  SpO2: 97%   Physical Exam Constitutional:      General: She is not in acute distress.  Appearance: Normal appearance. She is not toxic-appearing.  HENT:     Head: Normocephalic and atraumatic.  Eyes:     General: No scleral icterus. Cardiovascular:     Rate and Rhythm: Normal rate and regular rhythm.     Pulses: Normal pulses.     Heart sounds: Normal heart sounds.  Pulmonary:     Effort: Pulmonary effort is normal.     Breath sounds: Normal breath sounds.  Chest:     Comments: She is status post bilateral mastectomy.  No palpable masses or regional adenopathy. Abdominal:     General: Abdomen is flat. Bowel sounds are normal. There is no distension.     Palpations: Abdomen is soft.     Tenderness: There is no abdominal tenderness.  Musculoskeletal:        General: No swelling.     Cervical back: Neck supple.  Lymphadenopathy:     Cervical: No cervical adenopathy.  Skin:    General: Skin is warm and dry.     Findings: No rash.  Neurological:     General: No focal deficit present.     Mental Status: She is alert.  Psychiatric:        Mood and Affect: Mood normal.        Behavior: Behavior normal.     LABORATORY DATA:  CBC    Component Value Date/Time   WBC 4.4 11/16/2022 1325   WBC 11.5 (H) 05/18/2022 0510   RBC 3.91 11/16/2022 1325   HGB 13.4 11/16/2022 1325   HCT 38.0 11/16/2022 1325   PLT 247 11/16/2022 1325   MCV 97.2 11/16/2022 1325   MCH 34.3 (H) 11/16/2022 1325   MCHC 35.3 11/16/2022 1325   RDW 12.2 11/16/2022 1325    LYMPHSABS 1.4 11/16/2022 1325   MONOABS 0.3 11/16/2022 1325   EOSABS 0.0 11/16/2022 1325   BASOSABS 0.0 11/16/2022 1325    CMP     Component Value Date/Time   NA 140 11/16/2022 1325   K 4.4 11/16/2022 1325   CL 107 11/16/2022 1325   CO2 29 11/16/2022 1325   GLUCOSE 103 (H) 11/16/2022 1325   BUN 22 11/16/2022 1325   CREATININE 0.80 11/16/2022 1325   CREATININE 0.66 08/28/2012 1400   CALCIUM 9.6 11/16/2022 1325   PROT 6.7 11/16/2022 1325   ALBUMIN 4.4 11/16/2022 1325   AST 18 11/16/2022 1325   ALT 14 11/16/2022 1325   ALKPHOS 72 11/16/2022 1325   BILITOT 0.5 11/16/2022 1325   GFRNONAA >60 11/16/2022 1325   GFRNONAA >89 08/28/2012 1400   GFRAA >60 01/17/2019 1301   GFRAA >89 08/28/2012 1400       ASSESSMENT and THERAPY PLAN:   Metastatic carcinoma (HCC) Alfred is a 67 year old woman with metastatic breast cancer here today for follow-up and evaluation prior to receiving every 3-week pembrolizumab. She continues to tolerate the pembrolizumab well.  She has no major toxicities today.  Assessment and Plan    Cancer Treatment Patient is currently on Keytruda. Reported experiencing short-term memory loss and occasional pain at the base of the skull.  Discussed the possibility of these symptoms being side effects of the treatment, such as brain fog and exacerbation of arthritis. -Continue Keytruda treatment. -Plan to increase dose to 400 for the next treatment to accommodate patient's travel schedule. -Check in with patient regarding how she feels on the increased dose to determine future treatment plans.  Travel Plans Patient has upcoming travel to [place] in October and potential travel in February/March  for the birth of a grandchild. Discussed adjusting treatment schedule to accommodate these plans. -Next treatment on October 29th with increased dose of 400. -Following treatment will be on December 17th. -Will discuss potential six-week treatment schedule in December  to accommodate potential travel in February/March.  Occipital Pain Patient reported occasional pain at the base of the skull. No other neurological symptoms reported. -Advise patient to monitor symptoms and report any changes or additional symptoms.        All questions were answered. The patient knows to call the clinic with any problems, questions or concerns. We can certainly see the patient much sooner if necessary.  Total encounter time:30 minutes*in face-to-face visit time, chart review, lab review, care coordination, order entry, and documentation of the encounter time.  *Total Encounter Time as defined by the Centers for Medicare and Medicaid Services includes, in addition to the face-to-face time of a patient visit (documented in the note above) non-face-to-face time: obtaining and reviewing outside history, ordering and reviewing medications, tests or procedures, care coordination (communications with other health care professionals or caregivers) and documentation in the medical record.

## 2022-11-16 NOTE — Patient Instructions (Signed)
[facility]  Discharge Instructions: Thank you for choosing [facility] to provide your oncology and hematology care.   If you have a lab appointment with the Cancer Center, please go directly to the Cancer Center and check in at the registration area.   Wear comfortable clothing and clothing appropriate for easy access to any Portacath or PICC line.   We strive to give you quality time with your provider. You may need to reschedule your appointment if you arrive late (15 or more minutes).  Arriving late affects you and other patients whose appointments are after yours.  Also, if you miss three or more appointments without notifying the office, you may be dismissed from the clinic at the provider's discretion.      For prescription refill requests, have your pharmacy contact our office and allow 72 hours for refills to be completed.    Today you received the following chemotherapy and/or immunotherapy agent: Pembrolizumab (Keytruda)   To help prevent nausea and vomiting after your treatment, we encourage you to take your nausea medication as directed.  BELOW ARE SYMPTOMS THAT SHOULD BE REPORTED IMMEDIATELY: *FEVER GREATER THAN 100.4 F (38 C) OR HIGHER *CHILLS OR SWEATING *NAUSEA AND VOMITING THAT IS NOT CONTROLLED WITH YOUR NAUSEA MEDICATION *UNUSUAL SHORTNESS OF BREATH *UNUSUAL BRUISING OR BLEEDING *URINARY PROBLEMS (pain or burning when urinating, or frequent urination) *BOWEL PROBLEMS (unusual diarrhea, constipation, pain near the anus) TENDERNESS IN MOUTH AND THROAT WITH OR WITHOUT PRESENCE OF ULCERS (sore throat, sores in mouth, or a toothache) UNUSUAL RASH, SWELLING OR PAIN  UNUSUAL VAGINAL DISCHARGE OR ITCHING   Items with * indicate a potential emergency and should be followed up as soon as possible or go to the Emergency Department if any problems should occur.  Please show the CHEMOTHERAPY ALERT CARD or IMMUNOTHERAPY ALERT  CARD at check-in to the Emergency Department and triage nurse.  Should you have questions after your visit or need to cancel or reschedule your appointment, please contact [facility]  Dept: [phone]  and follow the prompts.  Office hours are 8:00 a.m. to 4:30 p.m. Monday - Friday. Please note that voicemails left after 4:00 p.m. may not be returned until the following business day.  We are closed weekends and major holidays. You have access to a nurse at all times for urgent questions. Please call the main number to the clinic Dept: [phone] and follow the prompts.   For any non-urgent questions, you may also contact your provider using MyChart. We now offer e-Visits for anyone 82 and older to request care online for non-urgent symptoms. For details visit [email or web address].   Also download the MyChart app! Go to the app store, search "MyChart", open the app, select , and log in with your MyChart username and password. Pembrolizumab Injection What is this medication? PEMBROLIZUMAB (PEM broe LIZ ue mab) treats some types of cancer. It works by helping your immune system slow or stop the spread of cancer cells. It is a monoclonal antibody. This medicine may be used for other purposes; ask your health care provider or pharmacist if you have questions. COMMON BRAND NAME(S): Keytruda What should I tell my care team before I take this medication? They need to know if you have any of these conditions: Allogeneic stem cell transplant (uses someone else's stem cells) Autoimmune diseases, such as Crohn disease, ulcerative colitis, lupus History of chest radiation Nervous system problems, such  as Guillain-Barre syndrome, myasthenia gravis Organ transplant An unusual or allergic reaction to pembrolizumab, other medications, foods, dyes, or preservatives Pregnant or trying to get pregnant Breast-feeding How should I use this medication? This  medication is injected into a vein. It is given by your care team in a hospital or clinic setting. A special MedGuide will be given to you before each treatment. Be sure to read this information carefully each time. Talk to your care team about the use of this medication in children. While it may be prescribed for children as young as 6 months for selected conditions, precautions do apply. Overdosage: If you think you have taken too much of this medicine contact a poison control center or emergency room at once. NOTE: This medicine is only for you. Do not share this medicine with others. What if I miss a dose? Keep appointments for follow-up doses. It is important not to miss your dose. Call your care team if you are unable to keep an appointment. What may interact with this medication? Interactions have not been studied. This list may not describe all possible interactions. Give your health care provider a list of all the medicines, herbs, non-prescription drugs, or dietary supplements you use. Also tell them if you smoke, drink alcohol, or use illegal drugs. Some items may interact with your medicine. What should I watch for while using this medication? Your condition will be monitored carefully while you are receiving this medication. You may need blood work while taking this medication. This medication may cause serious skin reactions. They can happen weeks to months after starting the medication. Contact your care team right away if you notice fevers or flu-like symptoms with a rash. The rash may be red or purple and then turn into blisters or peeling of the skin. You may also notice a red rash with swelling of the face, lips, or lymph nodes in your neck or under your arms. Tell your care team right away if you have any change in your eyesight. Talk to your care team if you may be pregnant. Serious birth defects can occur if you take this medication during pregnancy and for 4 months after the last  dose. You will need a negative pregnancy test before starting this medication. Contraception is recommended while taking this medication and for 4 months after the last dose. Your care team can help you find the option that works for you. Do not breastfeed while taking this medication and for 4 months after the last dose. What side effects may I notice from receiving this medication? Side effects that you should report to your care team as soon as possible: Allergic reactions--skin rash, itching, hives, swelling of the face, lips, tongue, or throat Dry cough, shortness of breath or trouble breathing Eye pain, redness, irritation, or discharge with blurry or decreased vision Heart muscle inflammation--unusual weakness or fatigue, shortness of breath, chest pain, fast or irregular heartbeat, dizziness, swelling of the ankles, feet, or hands Hormone gland problems--headache, sensitivity to light, unusual weakness or fatigue, dizziness, fast or irregular heartbeat, increased sensitivity to cold or heat, excessive sweating, constipation, hair loss, increased thirst or amount of urine, tremors or shaking, irritability Infusion reactions--chest pain, shortness of breath or trouble breathing, feeling faint or lightheaded Kidney injury (glomerulonephritis)--decrease in the amount of urine, red or dark brown urine, foamy or bubbly urine, swelling of the ankles, hands, or feet Liver injury--right upper belly pain, loss of appetite, nausea, light-colored stool, dark yellow or brown urine, yellowing  skin or eyes, unusual weakness or fatigue Pain, tingling, or numbness in the hands or feet, muscle weakness, change in vision, confusion or trouble speaking, loss of balance or coordination, trouble walking, seizures Rash, fever, and swollen lymph nodes Redness, blistering, peeling, or loosening of the skin, including inside the mouth Sudden or severe stomach pain, bloody diarrhea, fever, nausea, vomiting Side effects  that usually do not require medical attention (report to your care team if they continue or are bothersome): Bone, joint, or muscle pain Diarrhea Fatigue Loss of appetite Nausea Skin rash This list may not describe all possible side effects. Call your doctor for medical advice about side effects. You may report side effects to FDA at 1-800-FDA-1088. Where should I keep my medication? This medication is given in a hospital or clinic. It will not be stored at home. NOTE: This sheet is a summary. It may not cover all possible information. If you have questions about this medicine, talk to your doctor, pharmacist, or health care provider.  2024 Elsevier/Gold Standard (2021-06-30 00:00:00)

## 2022-11-16 NOTE — Patient Instructions (Signed)

## 2022-11-17 ENCOUNTER — Other Ambulatory Visit: Payer: Self-pay

## 2022-11-30 ENCOUNTER — Other Ambulatory Visit: Payer: Self-pay

## 2022-12-07 ENCOUNTER — Inpatient Hospital Stay: Payer: PPO

## 2022-12-07 ENCOUNTER — Inpatient Hospital Stay: Payer: PPO | Admitting: Adult Health

## 2022-12-11 ENCOUNTER — Other Ambulatory Visit: Payer: Self-pay

## 2022-12-14 ENCOUNTER — Other Ambulatory Visit: Payer: Self-pay

## 2022-12-14 ENCOUNTER — Ambulatory Visit (INDEPENDENT_AMBULATORY_CARE_PROVIDER_SITE_OTHER): Payer: Self-pay | Admitting: Plastic Surgery

## 2022-12-14 DIAGNOSIS — Z719 Counseling, unspecified: Secondary | ICD-10-CM

## 2022-12-14 MED ORDER — ROSUVASTATIN CALCIUM 10 MG PO TABS: 10.0000 mg | ORAL_TABLET | Freq: Every day | ORAL | 0 refills | Status: DC

## 2022-12-14 NOTE — Progress Notes (Signed)

## 2022-12-17 ENCOUNTER — Encounter: Payer: PPO | Admitting: Plastic Surgery

## 2022-12-20 ENCOUNTER — Encounter: Payer: PPO | Admitting: Plastic Surgery

## 2022-12-28 ENCOUNTER — Institutional Professional Consult (permissible substitution): Payer: Self-pay | Admitting: Plastic Surgery

## 2022-12-28 ENCOUNTER — Inpatient Hospital Stay: Payer: PPO

## 2022-12-28 ENCOUNTER — Inpatient Hospital Stay: Payer: PPO | Attending: Hematology and Oncology | Admitting: Hematology and Oncology

## 2022-12-28 DIAGNOSIS — C799 Secondary malignant neoplasm of unspecified site: Secondary | ICD-10-CM | POA: Diagnosis not present

## 2022-12-28 DIAGNOSIS — C784 Secondary malignant neoplasm of small intestine: Secondary | ICD-10-CM | POA: Diagnosis present

## 2022-12-28 DIAGNOSIS — C50919 Malignant neoplasm of unspecified site of unspecified female breast: Secondary | ICD-10-CM | POA: Diagnosis present

## 2022-12-28 DIAGNOSIS — Z95828 Presence of other vascular implants and grafts: Secondary | ICD-10-CM

## 2022-12-28 DIAGNOSIS — Z5112 Encounter for antineoplastic immunotherapy: Secondary | ICD-10-CM | POA: Insufficient documentation

## 2022-12-28 DIAGNOSIS — Z79620 Long term (current) use of immunosuppressive biologic: Secondary | ICD-10-CM | POA: Diagnosis not present

## 2022-12-28 LAB — CMP (CANCER CENTER ONLY)
ALT: 16 U/L (ref 10–47)
AST: 22 U/L (ref 15–41)
Albumin: 4.4 g/dL (ref 3.5–5.0)
Alkaline Phosphatase: 72 U/L (ref 38–126)
Anion gap: 5 (ref 5–15)
BUN: 18 mg/dL (ref 8–23)
CO2: 30 mmol/L (ref 22–32)
Calcium: 9.9 mg/dL (ref 8.9–10.3)
Chloride: 106 mmol/L (ref 98–111)
Creatinine: 0.93 mg/dL (ref 0.60–1.20)
GFR, Estimated: >60 mL/min (ref >60)
Glucose, Bld: 133 mg/dL — ABNORMAL HIGH (ref 70–99)
Potassium: 3.8 mmol/L (ref 3.5–5.1)
Sodium: 141 mmol/L (ref 135–145)
Total Bilirubin: 0.7 mg/dL (ref 0.3–1.2)
Total Protein: 6.9 g/dL (ref 6.5–8.1)

## 2022-12-28 LAB — CBC WITH DIFFERENTIAL (CANCER CENTER ONLY)
Abs Immature Granulocytes: 0.01 10*3/uL (ref 0.00–0.07)
Basophils Absolute: 0 10*3/uL (ref 0.0–0.1)
Basophils Relative: 1 %
Eosinophils Absolute: 0 10*3/uL (ref 0.0–0.5)
Eosinophils Relative: 1 %
HCT: 37.3 % (ref 36.0–46.0)
Hemoglobin: 12.8 g/dL (ref 12.0–15.0)
Immature Granulocytes: 0 %
Lymphocytes Relative: 31 %
Lymphs Abs: 1.6 10*3/uL (ref 0.7–4.0)
MCH: 33.5 pg (ref 26.0–34.0)
MCHC: 34.3 g/dL (ref 30.0–36.0)
MCV: 97.6 fL (ref 80.0–100.0)
Monocytes Absolute: 0.5 10*3/uL (ref 0.1–1.0)
Monocytes Relative: 9 %
Neutro Abs: 3 10*3/uL (ref 1.7–7.7)
Neutrophils Relative %: 58 %
Platelet Count: 203 10*3/uL (ref 150–400)
RBC: 3.82 MIL/uL — ABNORMAL LOW (ref 3.87–5.11)
RDW: 12 % (ref 11.5–15.5)
WBC Count: 5.1 10*3/uL (ref 4.0–10.5)
nRBC: 0 % (ref 0.0–0.2)

## 2022-12-28 LAB — TSH: TSH: 2.374 u[IU]/mL (ref 0.350–4.500)

## 2022-12-28 MED ORDER — HEPARIN SOD (PORK) LOCK FLUSH 100 UNIT/ML IV SOLN
500.0000 [IU] | Freq: Once | INTRAVENOUS | Status: AC | PRN
  Administered 2022-12-28: 500 [IU]

## 2022-12-28 MED ORDER — SODIUM CHLORIDE 0.9% FLUSH
10.0000 mL | INTRAVENOUS | Status: DC | PRN
  Administered 2022-12-28: 10 mL

## 2022-12-28 MED ORDER — DICLOFENAC SODIUM 75 MG PO TBEC: 75.0000 mg | DELAYED_RELEASE_TABLET | Freq: Two times a day (BID) | ORAL | 0 refills | Status: AC

## 2022-12-28 MED ORDER — SODIUM CHLORIDE 0.9% FLUSH
10.0000 mL | Freq: Once | INTRAVENOUS | Status: AC
  Administered 2022-12-28: 10 mL

## 2022-12-28 MED ORDER — SODIUM CHLORIDE 0.9 % IV SOLN
Freq: Once | INTRAVENOUS | Status: AC
Start: 2022-12-28 — End: 2022-12-28

## 2022-12-28 MED ORDER — PEMBROLIZUMAB CHEMO INJECTION 100 MG/4ML
400.0000 mg | Freq: Once | INTRAVENOUS | Status: AC
  Administered 2022-12-28: 400 mg via INTRAVENOUS
  Filled 2022-12-28: qty 16

## 2022-12-28 NOTE — Patient Instructions (Signed)
[facility]  Discharge Instructions: Thank you for choosing [facility] to provide your oncology and hematology care.   If you have a lab appointment with the Cancer Center, please go directly to the Cancer Center and check in at the registration area.   Wear comfortable clothing and clothing appropriate for easy access to any Portacath or PICC line.   We strive to give you quality time with your provider. You may need to reschedule your appointment if you arrive late (15 or more minutes).  Arriving late affects you and other patients whose appointments are after yours.  Also, if you miss three or more appointments without notifying the office, you may be dismissed from the clinic at the provider's discretion.      For prescription refill requests, have your pharmacy contact our office and allow 72 hours for refills to be completed.    Today you received the following chemotherapy and/or immunotherapy agent: Pembrolizumab (Keytruda)   To help prevent nausea and vomiting after your treatment, we encourage you to take your nausea medication as directed.  BELOW ARE SYMPTOMS THAT SHOULD BE REPORTED IMMEDIATELY: *FEVER GREATER THAN 100.4 F (38 C) OR HIGHER *CHILLS OR SWEATING *NAUSEA AND VOMITING THAT IS NOT CONTROLLED WITH YOUR NAUSEA MEDICATION *UNUSUAL SHORTNESS OF BREATH *UNUSUAL BRUISING OR BLEEDING *URINARY PROBLEMS (pain or burning when urinating, or frequent urination) *BOWEL PROBLEMS (unusual diarrhea, constipation, pain near the anus) TENDERNESS IN MOUTH AND THROAT WITH OR WITHOUT PRESENCE OF ULCERS (sore throat, sores in mouth, or a toothache) UNUSUAL RASH, SWELLING OR PAIN  UNUSUAL VAGINAL DISCHARGE OR ITCHING   Items with * indicate a potential emergency and should be followed up as soon as possible or go to the Emergency Department if any problems should occur.  Please show the CHEMOTHERAPY ALERT CARD or IMMUNOTHERAPY ALERT  CARD at check-in to the Emergency Department and triage nurse.  Should you have questions after your visit or need to cancel or reschedule your appointment, please contact [facility]  Dept: [phone]  and follow the prompts.  Office hours are 8:00 a.m. to 4:30 p.m. Monday - Friday. Please note that voicemails left after 4:00 p.m. may not be returned until the following business day.  We are closed weekends and major holidays. You have access to a nurse at all times for urgent questions. Please call the main number to the clinic Dept: [phone] and follow the prompts.   For any non-urgent questions, you may also contact your provider using MyChart. We now offer e-Visits for anyone 18 and older to request care online for non-urgent symptoms. For details visit [email or web address].   Also download the MyChart app! Go to the app store, search "MyChart", open the app, select [place], and log in with your MyChart username and password.  Pembrolizumab Injection What is this medication? PEMBROLIZUMAB (PEM broe LIZ ue mab) treats some types of cancer. It works by helping your immune system slow or stop the spread of cancer cells. It is a monoclonal antibody. This medicine may be used for other purposes; ask your health care provider or pharmacist if you have questions. COMMON BRAND NAME(S): Keytruda What should I tell my care team before I take this medication? They need to know if you have any of these conditions: Allogeneic stem cell transplant (uses someone else's stem cells) Autoimmune diseases, such as Crohn disease, ulcerative colitis, lupus History of chest radiation Nervous system problems,   such as Guillain-Barre syndrome, myasthenia gravis Organ transplant An unusual or allergic reaction to pembrolizumab, other medications, foods, dyes, or preservatives Pregnant or trying to get pregnant Breast-feeding How should I use this medication? This  medication is injected into a vein. It is given by your care team in a hospital or clinic setting. A special MedGuide will be given to you before each treatment. Be sure to read this information carefully each time. Talk to your care team about the use of this medication in children. While it may be prescribed for children as young as 6 months for selected conditions, precautions do apply. Overdosage: If you think you have taken too much of this medicine contact a poison control center or emergency room at once. NOTE: This medicine is only for you. Do not share this medicine with others. What if I miss a dose? Keep appointments for follow-up doses. It is important not to miss your dose. Call your care team if you are unable to keep an appointment. What may interact with this medication? Interactions have not been studied. This list may not describe all possible interactions. Give your health care provider a list of all the medicines, herbs, non-prescription drugs, or dietary supplements you use. Also tell them if you smoke, drink alcohol, or use illegal drugs. Some items may interact with your medicine. What should I watch for while using this medication? Your condition will be monitored carefully while you are receiving this medication. You may need blood work while taking this medication. This medication may cause serious skin reactions. They can happen weeks to months after starting the medication. Contact your care team right away if you notice fevers or flu-like symptoms with a rash. The rash may be red or purple and then turn into blisters or peeling of the skin. You may also notice a red rash with swelling of the face, lips, or lymph nodes in your neck or under your arms. Tell your care team right away if you have any change in your eyesight. Talk to your care team if you may be pregnant. Serious birth defects can occur if you take this medication during pregnancy and for 4 months after the last  dose. You will need a negative pregnancy test before starting this medication. Contraception is recommended while taking this medication and for 4 months after the last dose. Your care team can help you find the option that works for you. Do not breastfeed while taking this medication and for 4 months after the last dose. What side effects may I notice from receiving this medication? Side effects that you should report to your care team as soon as possible: Allergic reactions--skin rash, itching, hives, swelling of the face, lips, tongue, or throat Dry cough, shortness of breath or trouble breathing Eye pain, redness, irritation, or discharge with blurry or decreased vision Heart muscle inflammation--unusual weakness or fatigue, shortness of breath, chest pain, fast or irregular heartbeat, dizziness, swelling of the ankles, feet, or hands Hormone gland problems--headache, sensitivity to light, unusual weakness or fatigue, dizziness, fast or irregular heartbeat, increased sensitivity to cold or heat, excessive sweating, constipation, hair loss, increased thirst or amount of urine, tremors or shaking, irritability Infusion reactions--chest pain, shortness of breath or trouble breathing, feeling faint or lightheaded Kidney injury (glomerulonephritis)--decrease in the amount of urine, red or dark brown urine, foamy or bubbly urine, swelling of the ankles, hands, or feet Liver injury--right upper belly pain, loss of appetite, nausea, light-colored stool, dark yellow or brown urine,   yellowing skin or eyes, unusual weakness or fatigue Pain, tingling, or numbness in the hands or feet, muscle weakness, change in vision, confusion or trouble speaking, loss of balance or coordination, trouble walking, seizures Rash, fever, and swollen lymph nodes Redness, blistering, peeling, or loosening of the skin, including inside the mouth Sudden or severe stomach pain, bloody diarrhea, fever, nausea, vomiting Side effects  that usually do not require medical attention (report to your care team if they continue or are bothersome): Bone, joint, or muscle pain Diarrhea Fatigue Loss of appetite Nausea Skin rash This list may not describe all possible side effects. Call your doctor for medical advice about side effects. You may report side effects to FDA at 1-800-FDA-1088. Where should I keep my medication? This medication is given in a hospital or clinic. It will not be stored at home. NOTE: This sheet is a summary. It may not cover all possible information. If you have questions about this medicine, talk to your doctor, pharmacist, or health care provider.  2024 Elsevier/Gold Standard (2021-06-30 00:00:00)   

## 2022-12-28 NOTE — Assessment & Plan Note (Signed)
[name] is a 67 year old woman with metastatic breast cancer here today for follow-up and evaluation prior to receiving every 3-week pembrolizumab. She continues to tolerate the pembrolizumab well.  She has no major toxicities today.  Assessment and Plan    Cancer Treatment Patient is currently on Keytruda. Reported experiencing some arthralgias and wrists since her last dosing.  This joint pains lasted for about 2 weeks. Discussed the possibility of these symptoms being side effects of the treatment, refilled a short prescription for diclofenac since she is traveling for 6 weeks starting tomorrow. -Continue Keytruda treatment as planned today. -We will plan to repeat PET/CT in December. -Once she has done with a year of immunotherapy, I might consider guardant testing for circulating tumor cells once every 6 months on her.  Travel Plans Patient has upcoming travel to [place] in October and potential travel in February/March for the birth of a grandchild. Discussed adjusting treatment schedule to accommodate these plans. Next dose due in 6 weeks.

## 2022-12-28 NOTE — Progress Notes (Signed)
[facility] Cancer Follow up:    [name], MD [address] [place] [address]   DIAGNOSIS:  Cancer Staging  No matching staging information was found for the patient.   SUMMARY OF ONCOLOGIC HISTORY: Oncology History  Metastatic carcinoma (HCC)  10/2021 Genetic Testing   Negative. 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  Genes tested: AIP, ALK, APC, ATM, AXIN2, BAP1, BARD1, BLM, BMPR1A, BRCA1, BRCA2, BRIP1, CDC73, CDH1, CDK4, CDKN1B, CDKN2A, CHEK2, CTNNA1, DICER1, FANCC, FH, FLCN, GALNT12, KIF1B, LZTR1, MAX, MEN1, MET, MLH1, MSH2, MSH3, MSH6, MUTYH, NBN, NF1, NF2, NTHL1, PALB2, PHOX2B, PMS2, POT1, PRKAR1A, PTCH1, PTEN, RAD51C, RAD51D, RB1, RECQL, RET, SDHA, SDHAF2, SDHB, SDHC, SDHD, SMAD4, SMARCA4, SMARCB1, SMARCE1, STK11, SUFU, TMEM127, TP53, TSC1, TSC2, VHL and XRCC2 (sequencing and deletion/duplication); EGFR, EGLN1, HOXB13, KIT, MITF, PDGFRA, POLD1, and POLE (sequencing only); EPCAM and GREM1 (deletion/duplication only).    04/08/2022 Initial Biopsy   [facility] Enteroscopy and small bowel mass biopsy: The enteroscopy showed normal esophagus, stomach and examined duodenum.  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  Histology showed sheets to partially nested neoplastic cells with expression of CAM5.2 and GATA3, negative for CDX2, could represent metastatic breast cancer.  ER negative, PR negative, HER2 2+, FISH negative.    04/14/2022 Initial Biopsy   Omental biopsy: benign omental tissue   05/02/2022 PET scan   IMPRESSION: 1. There are 2 short segments of small bowel with mild wall thickening and increased radiotracer uptake within the left upper quadrant of the abdomen and lower pelvis. Findings may represent known small-bowel metastasis and/or additional sites of disease. 2. No tracer avid nodal metastasis or solid organ metastasis. 3.  Aortic  Atherosclerosis (ICD10-I70.0).     Electronically Signed   By: Signa Kell M.D.   On: 05/02/2022 09:49   05/17/2022 Surgery   Small bowel resection: Poorly differentiated carcinoma, 3.7cm; margins negative and 5.3cm margins negative, 1LN negative for cancer, likely breast primary   05/17/2022 Pathology Results   Foundation 1: PD-L1 CPS 60, PI3KCA mutation   06/22/2022 -  Chemotherapy   Patient is on Treatment Plan : BREAST Pembrolizumab (200) q21d x 24 months       CURRENT THERAPY: Keytruda  INTERVAL HISTORY:  Persia Fifield 67 y.o. female returns for follow-up and evaluation prior to receiving Keytruda.    Discussed the use of AI scribe software for clinical note transcription with the patient, who gave verbal consent to proceed.  History of Present Illness    [name] is here for a follow up.  Since her last visit here, she has been doing well.  She tolerated the 400 mg dosing of Keytruda quite well except for increase in arthralgias.  She had to use the Voltaren gel and some Tylenol for about 2 weeks after the last infusion.  She is traveling to [place] and [place] for 6 weeks from tomorrow.  She is getting her Keytruda dose today.  She once again denies any new health complaints.  No change in breathing, bowel habits or urinary habits.  Rest of the pertinent 10 point ROS reviewed and negative   Patient Active Problem List   Diagnosis Date Noted   Port-A-Cath in place 07/13/2022   Metastatic carcinoma (HCC) 06/06/2022   Small bowel mass 05/17/2022   SBO (small bowel obstruction) (HCC) 03/31/2022   Genetic testing 11/10/2021   Family history of breast cancer  10/27/2021   Family history of colon cancer 10/27/2021   S/P breast reconstruction, bilateral 09/30/2019   S/P mastectomy, bilateral 04/13/2019   Weight gain 09/09/2013   Encounter for counseling 11/25/2012   Screening for malignant neoplasm of cervix 11/25/2012   Menopause 10/08/2012   Vitamin D  deficiency 10/08/2012   Hyperlipemia 10/08/2012   Osteoarthritis 06/04/2012    has No Known Allergies.  MEDICAL HISTORY: Past Medical History:  Diagnosis Date   Arthritis    Backache, unspecified    Cancer (HCC)    breast s/p BL mastectomies   Colon polyps    Family history of breast cancer 10/27/2021   Family history of colon cancer 10/27/2021   Hyperlipidemia    Mitral valve prolapse of mother during pregnancy    PONV (postoperative nausea and vomiting)     SURGICAL HISTORY: Past Surgical History:  Procedure Laterality Date   BOWEL RESECTION N/A 05/17/2022   Procedure: SMALL BOWEL RESECTION;  Surgeon: Quentin Ore, MD;  Location: [facility];  Service: General;  Laterality: N/A;   BREAST RECONSTRUCTION WITH PLACEMENT OF TISSUE EXPANDER AND FLEX HD (ACELLULAR HYDRATED DERMIS) Bilateral 04/05/2019   Procedure: BILATERAL BREAST RECONSTRUCTION WITH PLACEMENT OF TISSUE EXPANDER AND FLEX HD (ACELLULAR HYDRATED DERMIS);  Surgeon: Peggye Form, DO;  Location: [facility];  Service: Plastics;  Laterality: Bilateral;   ENDOMETRIAL ABLATION  1998   GIVENS CAPSULE STUDY N/A 12/04/2021   Procedure: GIVENS CAPSULE STUDY;  Surgeon: Charna Elizabeth, MD;  Location: [facility];  Service: Gastroenterology;  Laterality: N/A;   IR IMAGING GUIDED PORT INSERTION  07/12/2022   KNEE SURGERY Right 2007   LAPAROSCOPY N/A 04/14/2022   Procedure: LAPAROSCOPY DIAGNOSTIC, G-TUBE PLACEMENT; OMENTUM BIOPSY; LYSIS OF ADHESIONS;  Surgeon: Quentin Ore, MD;  Location: [facility];  Service: General;  Laterality: N/A;   LIPOSUCTION WITH LIPOFILLING Bilateral 01/30/2020   Procedure: LIPOSUCTION WITH LIPOFILLING;  Surgeon: Peggye Form, DO;  Location: [facility];  Service: Plastics;  Laterality: Bilateral;  90 min, please   MASTECTOMY W/ SENTINEL NODE BIOPSY Bilateral 04/05/2019   Procedure: BILATERAL MASTECTOMIES WITH RIGHT SENTINEL LYMPH NODE BIOPSY;  Surgeon: Griselda Miner, MD;  Location: [facility];  Service: General;  Laterality: Bilateral;   REMOVAL OF BILATERAL TISSUE EXPANDERS WITH PLACEMENT OF BILATERAL BREAST IMPLANTS Bilateral 09/26/2019   Procedure: REMOVAL OF BILATERAL TISSUE EXPANDERS WITH PLACEMENT OF BILATERAL BREAST IMPLANTS;  Surgeon: Peggye Form, DO;  Location: [facility];  Service: Plastics;  Laterality: Bilateral;   RHINOPLASTY  1977   TONSILLECTOMY  1961    SOCIAL HISTORY: Social History   Socioeconomic History   Marital status: Married    Spouse name: Actor   Number of children: 3   Years of education: 16+   Highest education level: Not on file  Occupational History    Employer: [employer]   Occupation: Herbalist  Tobacco Use   Smoking status: Never   Smokeless tobacco: Never  Vaping Use   Vaping status: Never Used  Substance and Sexual Activity   Alcohol use: Not Currently    Comment: 2-3/week   Drug use: No   Sexual activity: Yes    Partners: Male  Other Topics Concern   Not on file  Social History Narrative   Marital Status:  Married Midwife)    Children:  G3 P3 Daughters (Lauren, Valma Cava, [place])    Pets:  Poodle (Mollie); Cats Billey [place], [place])    Living  Situation: Lives with spouse.  Her daughters live in [place].    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[employer])    Education:  Best boy)    Tobacco Use/Exposure:  None    Alcohol Use:  Occasional   Drug Use:  None   Diet:  Regular   Exercise:  Walking   Hobbies:  Gardening, Film/video editor              Social Determinants of Health   Financial Resource Strain: Low Risk  (04/07/2022)   Received from [facility], [facility]   Overall Financial Resource Strain (CARDIA)    Difficulty of Paying Living Expenses: Not hard at all  Food Insecurity: No Food Insecurity (04/13/2022)   Hunger Vital Sign    Worried About Running Out of  Food in the Last Year: Never true    Ran Out of Food in the Last Year: Never true  Transportation Needs: No Transportation Needs (04/13/2022)   PRAPARE - Administrator, Civil Service (Medical): No    Lack of Transportation (Non-Medical): No  Physical Activity: Sufficiently Active (04/07/2022)   Received from [facility], [facility]   Exercise Vital Sign    Days of Exercise per Week: 4 days    Minutes of Exercise per Session: 60 min  Stress: Stress Concern Present (04/07/2022)   Received from [facility], [facility] - Occupational Stress Questionnaire    Feeling of Stress : To some extent  Social Connections: Socially Integrated (04/07/2022)   Received from [facility], [facility]   Social Network    How would you rate your social network (family, work, friends)?: Good participation with social networks  Intimate Partner Violence: Not At Risk (04/13/2022)   Humiliation, Afraid, Rape, and Kick questionnaire    Fear of Current or Ex-Partner: No    Emotionally Abused: No    Physically Abused: No    Sexually Abused: No    FAMILY HISTORY: Family History  Problem Relation Age of Onset   Arthritis Mother    Colon cancer Mother 24   Melanoma Father        dx 44s; nose   Lung cancer Paternal Uncle        dx after 21; smoking hx   Breast cancer Maternal Grandmother        dx 69s   Hyperlipidemia Paternal Grandmother    Cancer Paternal Grandfather        unknown type; d. before 66   Colon cancer Other        MGM's mother    Review of Systems  Constitutional:  Negative for appetite change, chills, fatigue, fever and unexpected weight change.  HENT:   Negative for hearing loss, lump/mass and trouble swallowing.   Eyes:  Negative for eye problems and icterus.  Respiratory:  Negative for chest tightness, cough and shortness of breath.   Cardiovascular:  Negative for chest pain, leg swelling and palpitations.   Gastrointestinal:  Negative for abdominal distention, abdominal pain, constipation, diarrhea, nausea and vomiting.  Endocrine: Negative for hot flashes.  Genitourinary:  Negative for difficulty urinating.   Musculoskeletal:  Negative for arthralgias.  Skin:  Negative for itching and rash.  Neurological:  Negative for dizziness, extremity weakness, headaches and numbness.  Hematological:  Negative for adenopathy. Does not bruise/bleed easily.  Psychiatric/Behavioral:  Negative for depression. The patient is not nervous/anxious.  PHYSICAL EXAMINATION    Vitals:   12/28/22 0950  BP: 116/82  Pulse: 80  Resp: 16  Temp: (!) 97.5 F (36.4 C)  SpO2: 98%    Physical Exam Constitutional:      General: She is not in acute distress.    Appearance: Normal appearance. She is not toxic-appearing.  HENT:     Head: Normocephalic and atraumatic.  Eyes:     General: No scleral icterus. Cardiovascular:     Rate and Rhythm: Normal rate and regular rhythm.     Pulses: Normal pulses.     Heart sounds: Normal heart sounds.  Pulmonary:     Effort: Pulmonary effort is normal.     Breath sounds: Normal breath sounds.  Chest:     Comments: She is status post bilateral mastectomy.  No palpable masses or regional adenopathy. Abdominal:     General: Abdomen is flat. Bowel sounds are normal. There is no distension.     Palpations: Abdomen is soft.     Tenderness: There is no abdominal tenderness.  Musculoskeletal:        General: No swelling.     Cervical back: Neck supple.  Lymphadenopathy:     Cervical: No cervical adenopathy.  Skin:    General: Skin is warm and dry.     Findings: No rash.  Neurological:     General: No focal deficit present.     Mental Status: She is alert.  Psychiatric:        Mood and Affect: Mood normal.        Behavior: Behavior normal.     LABORATORY DATA:  CBC    Component Value Date/Time   WBC 5.1 12/28/2022 0909   WBC 11.5 (H) 05/18/2022 0510    RBC 3.82 (L) 12/28/2022 0909   HGB 12.8 12/28/2022 0909   HCT 37.3 12/28/2022 0909   PLT 203 12/28/2022 0909   MCV 97.6 12/28/2022 0909   MCH 33.5 12/28/2022 0909   MCHC 34.3 12/28/2022 0909   RDW 12.0 12/28/2022 0909   LYMPHSABS 1.6 12/28/2022 0909   MONOABS 0.5 12/28/2022 0909   EOSABS 0.0 12/28/2022 0909   BASOSABS 0.0 12/28/2022 0909    CMP     Component Value Date/Time   NA 141 12/28/2022 0909   K 3.8 12/28/2022 0909   CL 106 12/28/2022 0909   CO2 30 12/28/2022 0909   GLUCOSE 133 (H) 12/28/2022 0909   BUN 18 12/28/2022 0909   CREATININE 0.93 12/28/2022 0909   CREATININE 0.66 08/28/2012 1400   CALCIUM 9.9 12/28/2022 0909   PROT 6.9 12/28/2022 0909   ALBUMIN 4.4 12/28/2022 0909   AST 22 12/28/2022 0909   ALT 16 12/28/2022 0909   ALKPHOS 72 12/28/2022 0909   BILITOT 0.7 12/28/2022 0909   GFRNONAA >60 12/28/2022 0909   GFRNONAA >89 08/28/2012 1400   GFRAA >60 01/17/2019 1301   GFRAA >89 08/28/2012 1400       ASSESSMENT and THERAPY PLAN:   Metastatic carcinoma (HCC) Shwana is a 67 year old woman with metastatic breast cancer here today for follow-up and evaluation prior to receiving every 3-week pembrolizumab. She continues to tolerate the pembrolizumab well.  She has no major toxicities today.  Assessment and Plan    Cancer Treatment Patient is currently on Keytruda. Reported experiencing some arthralgias and wrists since her last dosing.  This joint pains lasted for about 2 weeks. Discussed the possibility of these symptoms being side effects of the treatment, refilled a short prescription for  diclofenac since she is traveling for 6 weeks starting tomorrow. -Continue Keytruda treatment as planned today. -We will plan to repeat PET/CT in December. -Once she has done with a year of immunotherapy, I might consider guardant testing for circulating tumor cells once every 6 months on her.  Travel Plans Patient has upcoming travel to [place] in October and  potential travel in February/March for the birth of a grandchild. Discussed adjusting treatment schedule to accommodate these plans. Next dose due in 6 weeks.     All questions were answered. The patient knows to call the clinic with any problems, questions or concerns. We can certainly see the patient much sooner if necessary.  Total encounter time:30 minutes*in face-to-face visit time, chart review, lab review, care coordination, order entry, and documentation of the encounter time.  *Total Encounter Time as defined by the Centers for Medicare and Medicaid Services includes, in addition to the face-to-face time of a patient visit (documented in the note above) non-face-to-face time: obtaining and reviewing outside history, ordering and reviewing medications, tests or procedures, care coordination (communications with other health care professionals or caregivers) and documentation in the medical record.

## 2022-12-29 LAB — T4: T4, Total: 7.4 ug/dL (ref 4.5–12.0)

## 2022-12-29 LAB — CA 125: Cancer Antigen (CA) 125: 8.4 U/mL (ref 0.0–38.1)

## 2023-01-20 ENCOUNTER — Other Ambulatory Visit: Payer: Self-pay

## 2023-01-22 ENCOUNTER — Other Ambulatory Visit: Payer: Self-pay

## 2023-02-15 ENCOUNTER — Inpatient Hospital Stay: Payer: PPO | Attending: Hematology and Oncology | Admitting: Hematology and Oncology

## 2023-02-15 ENCOUNTER — Inpatient Hospital Stay: Payer: PPO

## 2023-02-15 ENCOUNTER — Other Ambulatory Visit: Payer: Self-pay | Admitting: *Deleted

## 2023-02-15 ENCOUNTER — Encounter: Payer: Self-pay | Admitting: Hematology and Oncology

## 2023-02-15 VITALS — BP 110/73 | HR 78 | Temp 97.9°F | Resp 16 | Wt 154.4 lb

## 2023-02-15 DIAGNOSIS — C799 Secondary malignant neoplasm of unspecified site: Secondary | ICD-10-CM

## 2023-02-15 DIAGNOSIS — C171 Malignant neoplasm of jejunum: Secondary | ICD-10-CM | POA: Insufficient documentation

## 2023-02-15 DIAGNOSIS — Z95828 Presence of other vascular implants and grafts: Secondary | ICD-10-CM

## 2023-02-15 DIAGNOSIS — C50911 Malignant neoplasm of unspecified site of right female breast: Secondary | ICD-10-CM | POA: Diagnosis not present

## 2023-02-15 DIAGNOSIS — Z5112 Encounter for antineoplastic immunotherapy: Secondary | ICD-10-CM | POA: Diagnosis present

## 2023-02-15 DIAGNOSIS — Z171 Estrogen receptor negative status [ER-]: Secondary | ICD-10-CM

## 2023-02-15 DIAGNOSIS — R1902 Left upper quadrant abdominal swelling, mass and lump: Secondary | ICD-10-CM | POA: Insufficient documentation

## 2023-02-15 DIAGNOSIS — C784 Secondary malignant neoplasm of small intestine: Secondary | ICD-10-CM | POA: Insufficient documentation

## 2023-02-15 LAB — CBC WITH DIFFERENTIAL (CANCER CENTER ONLY)
Abs Immature Granulocytes: 0.01 10*3/uL (ref 0.00–0.07)
Basophils Absolute: 0 10*3/uL (ref 0.0–0.1)
Basophils Relative: 0 %
Eosinophils Absolute: 0.1 10*3/uL (ref 0.0–0.5)
Eosinophils Relative: 1 %
HCT: 40.1 % (ref 36.0–46.0)
Hemoglobin: 13.6 g/dL (ref 12.0–15.0)
Immature Granulocytes: 0 %
Lymphocytes Relative: 35 %
Lymphs Abs: 1.6 10*3/uL (ref 0.7–4.0)
MCH: 33 pg (ref 26.0–34.0)
MCHC: 33.9 g/dL (ref 30.0–36.0)
MCV: 97.3 fL (ref 80.0–100.0)
Monocytes Absolute: 0.3 10*3/uL (ref 0.1–1.0)
Monocytes Relative: 7 %
Neutro Abs: 2.6 10*3/uL (ref 1.7–7.7)
Neutrophils Relative %: 57 %
Platelet Count: 215 10*3/uL (ref 150–400)
RBC: 4.12 MIL/uL (ref 3.87–5.11)
RDW: 11.6 % (ref 11.5–15.5)
WBC Count: 4.7 10*3/uL (ref 4.0–10.5)
nRBC: 0 % (ref 0.0–0.2)

## 2023-02-15 LAB — CMP (CANCER CENTER ONLY)
ALT: 19 U/L (ref 0–44)
AST: 23 U/L (ref 15–41)
Albumin: 4.4 g/dL (ref 3.5–5.0)
Alkaline Phosphatase: 64 U/L (ref 38–126)
Anion gap: 7 (ref 5–15)
BUN: 19 mg/dL (ref 8–23)
CO2: 29 mmol/L (ref 22–32)
Calcium: 9.7 mg/dL (ref 8.9–10.3)
Chloride: 103 mmol/L (ref 98–111)
Creatinine: 0.9 mg/dL (ref 0.44–1.00)
GFR, Estimated: >60 mL/min (ref >60)
Glucose, Bld: 97 mg/dL (ref 70–99)
Potassium: 4.2 mmol/L (ref 3.5–5.1)
Sodium: 139 mmol/L (ref 135–145)
Total Bilirubin: 0.7 mg/dL (ref <1.2)
Total Protein: 6.6 g/dL (ref 6.5–8.1)

## 2023-02-15 MED ORDER — SODIUM CHLORIDE 0.9% FLUSH
10.0000 mL | Freq: Once | INTRAVENOUS | Status: AC
  Administered 2023-02-15: 10 mL

## 2023-02-15 MED ORDER — HEPARIN SOD (PORK) LOCK FLUSH 100 UNIT/ML IV SOLN
500.0000 [IU] | Freq: Once | INTRAVENOUS | Status: AC | PRN
Start: 2023-02-15 — End: 2023-02-15
  Administered 2023-02-15: 500 [IU]

## 2023-02-15 MED ORDER — SODIUM CHLORIDE 0.9 % IV SOLN
400.0000 mg | Freq: Once | INTRAVENOUS | Status: AC
  Administered 2023-02-15: 400 mg via INTRAVENOUS
  Filled 2023-02-15: qty 16

## 2023-02-15 MED ORDER — HEPARIN SOD (PORK) LOCK FLUSH 100 UNIT/ML IV SOLN: 500.0000 [IU] | Freq: Once | INTRAVENOUS | Status: DC

## 2023-02-15 MED ORDER — SODIUM CHLORIDE 0.9 % IV SOLN: Freq: Once | INTRAVENOUS | Status: AC

## 2023-02-15 MED ORDER — SODIUM CHLORIDE 0.9% FLUSH
10.0000 mL | INTRAVENOUS | Status: DC | PRN
  Administered 2023-02-15: 10 mL

## 2023-02-15 NOTE — Assessment & Plan Note (Signed)
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   Outpatient PET/CT showed 2 short segments of small bowel with mild wall thickening and increased radiotracer uptake within the left upper quadrant of abdominal and lower pelvis.  Findings may represent known small bowel metastasis or additional sites of disease   She underwent surgical resection, two small bowel masses, neg margins, once again path with metastatic breast cancer likely, triple neg. Foundation one showed CPS 60%, PIK3CA mutation, no other targets. We have discussed about considering immunotherapy maintenance for at least a year and periodic imaging.   She is now on maintenance immunotherapy, tolerates extremely well.  Patient is tolerating current treatment well. Last treatment was 400mg  and patient reported no adverse effects. -Continue with 400mg  treatment every six weeks   Constipation Patient reports not having a bowel movement since returning from travel. Currently taking Dulcolax daily. -Consider using Dulcolax suppository if no improvement in a couple of days. -Consider taking a laxative such as Nolix.  General Health Maintenance -Order a PET scan to be scheduled in approximately two weeks. -Consider complementary DNA tumor blood test every six months. Provide patient with pamphlet and further information.

## 2023-02-15 NOTE — Patient Instructions (Signed)

## 2023-02-15 NOTE — Progress Notes (Signed)
[facility] Cancer Follow up:    Caroline Inch, MD [address] [name] [address]   DIAGNOSIS:  Cancer Staging  No matching staging information was found for the patient.   SUMMARY OF ONCOLOGIC HISTORY: Oncology History  Metastatic carcinoma (HCC)  10/2021 Genetic Testing   Negative. 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  Genes tested: AIP, ALK, APC, ATM, AXIN2, BAP1, BARD1, BLM, BMPR1A, BRCA1, BRCA2, BRIP1, CDC73, CDH1, CDK4, CDKN1B, CDKN2A, CHEK2, CTNNA1, DICER1, FANCC, FH, FLCN, GALNT12, KIF1B, LZTR1, MAX, MEN1, MET, MLH1, MSH2, MSH3, MSH6, MUTYH, NBN, NF1, NF2, NTHL1, PALB2, PHOX2B, PMS2, POT1, PRKAR1A, PTCH1, PTEN, RAD51C, RAD51D, RB1, RECQL, RET, SDHA, SDHAF2, SDHB, SDHC, SDHD, SMAD4, SMARCA4, SMARCB1, SMARCE1, STK11, SUFU, TMEM127, TP53, TSC1, TSC2, VHL and XRCC2 (sequencing and deletion/duplication); EGFR, EGLN1, HOXB13, KIT, MITF, PDGFRA, POLD1, and POLE (sequencing only); EPCAM and GREM1 (deletion/duplication only).    04/08/2022 Initial Biopsy   [facility] Enteroscopy and small bowel mass biopsy: The enteroscopy showed normal esophagus, stomach and examined duodenum.  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  Histology showed sheets to partially nested neoplastic cells with expression of CAM5.2 and GATA3, negative for CDX2, could represent metastatic breast cancer.  ER negative, PR negative, HER2 2+, FISH negative.    04/14/2022 Initial Biopsy   Omental biopsy: benign omental tissue   05/02/2022 PET scan   IMPRESSION: 1. There are 2 short segments of small bowel with mild wall thickening and increased radiotracer uptake within the left upper quadrant of the abdomen and lower pelvis. Findings may represent known small-bowel metastasis and/or additional sites of disease. 2. No tracer avid nodal metastasis or solid organ metastasis. 3.  Aortic  Atherosclerosis (ICD10-I70.0).     Electronically Signed   By: Signa Kell M.D.   On: 05/02/2022 09:49   05/17/2022 Surgery   Small bowel resection: Poorly differentiated carcinoma, 3.7cm; margins negative and 5.3cm margins negative, 1LN negative for cancer, likely breast primary   05/17/2022 Pathology Results   Foundation 1: PD-L1 CPS 60, PI3KCA mutation   06/22/2022 -  Chemotherapy   Patient is on Treatment Plan : BREAST Pembrolizumab (200) q21d x 24 months       CURRENT THERAPY: Keytruda  INTERVAL HISTORY:  [name] 67 y.o. female returns for follow-up and evaluation prior to receiving Keytruda.    Discussed the use of AI scribe software for clinical note transcription with the patient, who gave verbal consent to proceed.  History of Present Illness    [name] is here for a follow up.  Since her last visit here, she has been doing well.  She tolerated the 400 mg dosing of Keytruda quite well and would like to continue the same.  She once again denies any new health complaints.  No change in breathing, bowel habits or urinary habits except for some constipation since she returned.  Rest of the pertinent 10 point ROS reviewed and negative   Patient Active Problem List   Diagnosis Date Noted   Port-A-Cath in place 07/13/2022   Metastatic carcinoma (HCC) 06/06/2022   Small bowel mass 05/17/2022   SBO (small bowel obstruction) (HCC) 03/31/2022   Genetic testing 11/10/2021   Family history of breast cancer 10/27/2021   Family history of colon cancer 10/27/2021   S/P breast reconstruction, bilateral 09/30/2019   S/P mastectomy, bilateral 04/13/2019   Weight gain 09/09/2013   Encounter for counseling  11/25/2012   Screening for malignant neoplasm of cervix 11/25/2012   Menopause 10/08/2012   Vitamin D deficiency 10/08/2012   Hyperlipemia 10/08/2012   Osteoarthritis 06/04/2012    has no known allergies.  MEDICAL HISTORY: Past Medical History:  Diagnosis  Date   Arthritis    Backache, unspecified    Cancer (HCC)    breast s/p BL mastectomies   Colon polyps    Family history of breast cancer 10/27/2021   Family history of colon cancer 10/27/2021   Hyperlipidemia    Mitral valve prolapse of mother during pregnancy    PONV (postoperative nausea and vomiting)     SURGICAL HISTORY: Past Surgical History:  Procedure Laterality Date   BOWEL RESECTION N/A 05/17/2022   Procedure: SMALL BOWEL RESECTION;  Surgeon: Quentin Ore, MD;  Location: [facility];  Service: General;  Laterality: N/A;   BREAST RECONSTRUCTION WITH PLACEMENT OF TISSUE EXPANDER AND FLEX HD (ACELLULAR HYDRATED DERMIS) Bilateral 04/05/2019   Procedure: BILATERAL BREAST RECONSTRUCTION WITH PLACEMENT OF TISSUE EXPANDER AND FLEX HD (ACELLULAR HYDRATED DERMIS);  Surgeon: Peggye Form, DO;  Location: [facility];  Service: Plastics;  Laterality: Bilateral;   ENDOMETRIAL ABLATION  1998   GIVENS CAPSULE STUDY N/A 12/04/2021   Procedure: GIVENS CAPSULE STUDY;  Surgeon: Charna Elizabeth, MD;  Location: [facility];  Service: Gastroenterology;  Laterality: N/A;   IR IMAGING GUIDED PORT INSERTION  07/12/2022   KNEE SURGERY Right 2007   LAPAROSCOPY N/A 04/14/2022   Procedure: LAPAROSCOPY DIAGNOSTIC, G-TUBE PLACEMENT; OMENTUM BIOPSY; LYSIS OF ADHESIONS;  Surgeon: Quentin Ore, MD;  Location: [facility];  Service: General;  Laterality: N/A;   LIPOSUCTION WITH LIPOFILLING Bilateral 01/30/2020   Procedure: LIPOSUCTION WITH LIPOFILLING;  Surgeon: Peggye Form, DO;  Location: [facility];  Service: Plastics;  Laterality: Bilateral;  90 min, please   MASTECTOMY W/ SENTINEL NODE BIOPSY Bilateral 04/05/2019   Procedure: BILATERAL MASTECTOMIES WITH RIGHT SENTINEL LYMPH NODE BIOPSY;  Surgeon: Griselda Miner, MD;  Location: [facility];  Service: General;  Laterality: Bilateral;   REMOVAL OF BILATERAL TISSUE EXPANDERS WITH PLACEMENT OF BILATERAL  BREAST IMPLANTS Bilateral 09/26/2019   Procedure: REMOVAL OF BILATERAL TISSUE EXPANDERS WITH PLACEMENT OF BILATERAL BREAST IMPLANTS;  Surgeon: Peggye Form, DO;  Location: [facility];  Service: Plastics;  Laterality: Bilateral;   RHINOPLASTY  1977   TONSILLECTOMY  1961    SOCIAL HISTORY: Social History   Socioeconomic History   Marital status: Married    Spouse name: Actor   Number of children: 3   Years of education: 16+   Highest education level: Not on file  Occupational History    Employer: [employer]   Occupation: Herbalist  Tobacco Use   Smoking status: Never   Smokeless tobacco: Never  Vaping Use   Vaping status: Never Used  Substance and Sexual Activity   Alcohol use: Not Currently    Comment: 2-3/week   Drug use: No   Sexual activity: Yes    Partners: Male  Other Topics Concern   Not on file  Social History Narrative   Marital Status:  Married Midwife)    Children:  G3 P3 Daughters (Lauren, Valma Cava, Franquez)    Pets:  Poodle (Mollie); Cats Billey Gosling, Delorise Shiner)    Living Situation: Lives with spouse.  Her daughters live in [place].    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[employer])    Education:  Master's Degree (Dental Hygiene)    Tobacco Use/Exposure:  None    Alcohol Use:  Occasional   Drug Use:  None   Diet:  Regular   Exercise:  Walking   Hobbies:  Gardening, Shopping              Social Drivers of Health   Financial Resource Strain: Low Risk  (04/07/2022)   Received from [facility], [facility]   Overall Financial Resource Strain (CARDIA)    Difficulty of Paying Living Expenses: Not hard at all  Food Insecurity: No Food Insecurity (04/13/2022)   Hunger Vital Sign    Worried About Running Out of Food in the Last Year: Never true    Ran Out of Food in the Last Year: Never true  Transportation Needs: No Transportation Needs (04/13/2022)   PRAPARE -  Administrator, Civil Service (Medical): No    Lack of Transportation (Non-Medical): No  Physical Activity: Sufficiently Active (04/07/2022)   Received from [facility], [facility]   Exercise Vital Sign    Days of Exercise per Week: 4 days    Minutes of Exercise per Session: 60 min  Stress: Stress Concern Present (04/07/2022)   Received from [facility], [facility] - Occupational Stress Questionnaire    Feeling of Stress : To some extent  Social Connections: Socially Integrated (04/07/2022)   Received from [facility], [facility]   Social Network    How would you rate your social network (family, work, friends)?: Good participation with social networks  Intimate Partner Violence: Not At Risk (04/13/2022)   Humiliation, Afraid, Rape, and Kick questionnaire    Fear of Current or Ex-Partner: No    Emotionally Abused: No    Physically Abused: No    Sexually Abused: No    FAMILY HISTORY: Family History  Problem Relation Age of Onset   Arthritis Mother    Colon cancer Mother 72   Melanoma Father        dx 36s; nose   Lung cancer Paternal Uncle        dx after 19; smoking hx   Breast cancer Maternal Grandmother        dx 68s   Hyperlipidemia Paternal Grandmother    Cancer Paternal Grandfather        unknown type; d. before 17   Colon cancer Other        MGM's mother    Review of Systems  Constitutional:  Negative for appetite change, chills, fatigue, fever and unexpected weight change.  HENT:   Negative for hearing loss, lump/mass and trouble swallowing.   Eyes:  Negative for eye problems and icterus.  Respiratory:  Negative for chest tightness, cough and shortness of breath.   Cardiovascular:  Negative for chest pain, leg swelling and palpitations.  Gastrointestinal:  Negative for abdominal distention, abdominal pain, constipation, diarrhea, nausea and vomiting.  Endocrine: Negative for hot flashes.   Genitourinary:  Negative for difficulty urinating.   Musculoskeletal:  Negative for arthralgias.  Skin:  Negative for itching and rash.  Neurological:  Negative for dizziness, extremity weakness, headaches and numbness.  Hematological:  Negative for adenopathy. Does not bruise/bleed easily.  Psychiatric/Behavioral:  Negative for depression. The patient is not nervous/anxious.       PHYSICAL EXAMINATION    Vitals:   02/15/23 1313  BP: 110/73  Pulse: 78  Resp: 16  Temp: 97.9 F (36.6 C)  SpO2: 100%  Physical Exam Constitutional:      General: She is not in acute distress.    Appearance: Normal appearance. She is not toxic-appearing.  HENT:     Head: Normocephalic and atraumatic.  Eyes:     General: No scleral icterus. Cardiovascular:     Rate and Rhythm: Normal rate and regular rhythm.     Pulses: Normal pulses.     Heart sounds: Normal heart sounds.  Pulmonary:     Effort: Pulmonary effort is normal.     Breath sounds: Normal breath sounds.  Chest:     Comments: She is status post bilateral mastectomy.  No palpable masses or regional adenopathy. Abdominal:     General: Abdomen is flat. Bowel sounds are normal. There is no distension.     Palpations: Abdomen is soft.     Tenderness: There is no abdominal tenderness.  Musculoskeletal:        General: No swelling.     Cervical back: Neck supple.  Lymphadenopathy:     Cervical: No cervical adenopathy.  Skin:    General: Skin is warm and dry.     Findings: No rash.  Neurological:     General: No focal deficit present.     Mental Status: She is alert.  Psychiatric:        Mood and Affect: Mood normal.        Behavior: Behavior normal.     LABORATORY DATA:  CBC    Component Value Date/Time   WBC 4.7 02/15/2023 1231   WBC 11.5 (H) 05/18/2022 0510   RBC 4.12 02/15/2023 1231   HGB 13.6 02/15/2023 1231   HCT 40.1 02/15/2023 1231   PLT 215 02/15/2023 1231   MCV 97.3 02/15/2023 1231   MCH 33.0 02/15/2023  1231   MCHC 33.9 02/15/2023 1231   RDW 11.6 02/15/2023 1231   LYMPHSABS 1.6 02/15/2023 1231   MONOABS 0.3 02/15/2023 1231   EOSABS 0.1 02/15/2023 1231   BASOSABS 0.0 02/15/2023 1231    CMP     Component Value Date/Time   NA 139 02/15/2023 1231   K 4.2 02/15/2023 1231   CL 103 02/15/2023 1231   CO2 29 02/15/2023 1231   GLUCOSE 97 02/15/2023 1231   BUN 19 02/15/2023 1231   CREATININE 0.90 02/15/2023 1231   CREATININE 0.66 08/28/2012 1400   CALCIUM 9.7 02/15/2023 1231   PROT 6.6 02/15/2023 1231   ALBUMIN 4.4 02/15/2023 1231   AST 23 02/15/2023 1231   ALT 19 02/15/2023 1231   ALKPHOS 64 02/15/2023 1231   BILITOT 0.7 02/15/2023 1231   GFRNONAA >60 02/15/2023 1231   GFRNONAA >89 08/28/2012 1400   GFRAA >60 01/17/2019 1301   GFRAA >89 08/28/2012 1400       ASSESSMENT and THERAPY PLAN:   Metastatic carcinoma (HCC) This is a very pleasant 67 year old postmenopausal female patient with 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   Outpatient PET/CT showed 2 short segments of small bowel with mild wall thickening and increased radiotracer uptake within the left upper quadrant of abdominal and lower pelvis.  Findings may represent known small bowel metastasis or additional sites of disease   She underwent surgical resection, two small bowel masses, neg  margins, once again path with metastatic breast cancer likely, triple neg. Foundation one showed CPS 60%, PIK3CA mutation, no other targets. We have discussed about considering immunotherapy maintenance for at least a year and periodic imaging.   She is now on maintenance immunotherapy,  tolerates extremely well.  Patient is tolerating current treatment well. Last treatment was 400mg  and patient reported no adverse effects. -Continue with 400mg  treatment every six weeks   Constipation Patient reports not having a bowel movement since returning from travel. Currently taking Dulcolax daily. -Consider using Dulcolax suppository if no improvement in a couple of days. -Consider taking a laxative such as Nolix.  General Health Maintenance -Order a PET scan to be scheduled in approximately two weeks. -Consider complementary DNA tumor blood test every six months. Provide patient with pamphlet and further information.   All questions were answered. The patient knows to call the clinic with any problems, questions or concerns. We can certainly see the patient much sooner if necessary.  Total encounter time:30 minutes*in face-to-face visit time, chart review, lab review, care coordination, order entry, and documentation of the encounter time.  *Total Encounter Time as defined by the Centers for Medicare and Medicaid Services includes, in addition to the face-to-face time of a patient visit (documented in the note above) non-face-to-face time: obtaining and reviewing outside history, ordering and reviewing medications, tests or procedures, care coordination (communications with other health care professionals or caregivers) and documentation in the medical record.

## 2023-02-16 LAB — CA 125: Cancer Antigen (CA) 125: 10 U/mL (ref 0.0–38.1)

## 2023-03-01 ENCOUNTER — Ambulatory Visit (HOSPITAL_COMMUNITY)
Admission: RE | Admit: 2023-03-01 | Discharge: 2023-03-01 | Disposition: A | Discharge status: Home / Self Care | Payer: PPO | Source: Ambulatory Visit | Attending: Hematology and Oncology | Admitting: Hematology and Oncology

## 2023-03-01 DIAGNOSIS — C50911 Malignant neoplasm of unspecified site of right female breast: Secondary | ICD-10-CM | POA: Insufficient documentation

## 2023-03-01 DIAGNOSIS — C799 Secondary malignant neoplasm of unspecified site: Secondary | ICD-10-CM | POA: Diagnosis present

## 2023-03-01 DIAGNOSIS — Z171 Estrogen receptor negative status [ER-]: Secondary | ICD-10-CM | POA: Diagnosis present

## 2023-03-01 LAB — GLUCOSE, CAPILLARY: Glucose-Capillary: 97 mg/dL (ref 70–99)

## 2023-03-01 MED ORDER — FLUDEOXYGLUCOSE F - 18 (FDG) INJECTION
7.9100 | Freq: Once | INTRAVENOUS | Status: AC
  Administered 2023-03-01: 7.91 via INTRAVENOUS

## 2023-03-03 ENCOUNTER — Telehealth: Payer: Self-pay | Admitting: Hematology and Oncology

## 2023-03-03 ENCOUNTER — Encounter (HOSPITAL_COMMUNITY): Payer: PPO

## 2023-03-03 NOTE — Telephone Encounter (Signed)
 Left patient a vm regarding upcoming appointment

## 2023-03-04 ENCOUNTER — Other Ambulatory Visit: Payer: Self-pay

## 2023-03-08 ENCOUNTER — Inpatient Hospital Stay: Payer: PPO | Admitting: Hematology and Oncology

## 2023-03-08 ENCOUNTER — Inpatient Hospital Stay: Payer: PPO | Attending: Hematology and Oncology

## 2023-03-08 ENCOUNTER — Inpatient Hospital Stay: Payer: PPO

## 2023-03-10 ENCOUNTER — Telehealth: Payer: Self-pay | Admitting: *Deleted

## 2023-03-10 NOTE — Telephone Encounter (Addendum)
-----   Message from [place] sent at 03/10/2023  4:33 PM EST ----- Overall great news, no concern for recurrence.  Called and informed pt.

## 2023-03-20 ENCOUNTER — Other Ambulatory Visit: Payer: Self-pay

## 2023-03-21 ENCOUNTER — Telehealth: Payer: Self-pay

## 2023-03-21 DIAGNOSIS — Z719 Counseling, unspecified: Secondary | ICD-10-CM

## 2023-03-21 NOTE — Telephone Encounter (Signed)
LVM per Julious Oka Todino for patient to pay for Zo'Skin Hydrating Cleanser $49.00

## 2023-03-29 ENCOUNTER — Other Ambulatory Visit: Payer: PPO

## 2023-03-29 ENCOUNTER — Encounter: Payer: Self-pay | Admitting: Hematology and Oncology

## 2023-03-29 ENCOUNTER — Inpatient Hospital Stay: Payer: PPO

## 2023-03-29 ENCOUNTER — Inpatient Hospital Stay: Payer: PPO | Admitting: Adult Health

## 2023-03-29 ENCOUNTER — Inpatient Hospital Stay: Payer: PPO | Attending: Hematology and Oncology

## 2023-03-29 ENCOUNTER — Ambulatory Visit: Payer: PPO | Admitting: Hematology and Oncology

## 2023-03-29 ENCOUNTER — Other Ambulatory Visit: Payer: Self-pay | Admitting: Hematology and Oncology

## 2023-03-29 VITALS — BP 126/89 | HR 79 | Temp 97.8°F | Resp 17 | Wt 163.5 lb

## 2023-03-29 DIAGNOSIS — Z95828 Presence of other vascular implants and grafts: Secondary | ICD-10-CM

## 2023-03-29 DIAGNOSIS — Z5112 Encounter for antineoplastic immunotherapy: Secondary | ICD-10-CM | POA: Diagnosis present

## 2023-03-29 DIAGNOSIS — C799 Secondary malignant neoplasm of unspecified site: Secondary | ICD-10-CM

## 2023-03-29 DIAGNOSIS — C784 Secondary malignant neoplasm of small intestine: Secondary | ICD-10-CM | POA: Diagnosis present

## 2023-03-29 DIAGNOSIS — Z171 Estrogen receptor negative status [ER-]: Secondary | ICD-10-CM | POA: Diagnosis not present

## 2023-03-29 DIAGNOSIS — C50911 Malignant neoplasm of unspecified site of right female breast: Secondary | ICD-10-CM | POA: Diagnosis present

## 2023-03-29 DIAGNOSIS — Z1722 Progesterone receptor negative status: Secondary | ICD-10-CM | POA: Diagnosis not present

## 2023-03-29 DIAGNOSIS — Z79620 Long term (current) use of immunosuppressive biologic: Secondary | ICD-10-CM | POA: Insufficient documentation

## 2023-03-29 DIAGNOSIS — Z1731 Human epidermal growth factor receptor 2 positive status: Secondary | ICD-10-CM | POA: Diagnosis not present

## 2023-03-29 LAB — CMP (CANCER CENTER ONLY)
ALT: 17 U/L (ref 0–44)
AST: 23 U/L (ref 15–41)
Albumin: 4.3 g/dL (ref 3.5–5.0)
Alkaline Phosphatase: 66 U/L (ref 38–126)
Anion gap: 5 (ref 5–15)
BUN: 15 mg/dL (ref 8–23)
CO2: 30 mmol/L (ref 22–32)
Calcium: 9.4 mg/dL (ref 8.9–10.3)
Chloride: 106 mmol/L (ref 98–111)
Creatinine: 0.77 mg/dL (ref 0.44–1.00)
GFR, Estimated: >60 mL/min (ref >60)
Glucose, Bld: 94 mg/dL (ref 70–99)
Potassium: 4.1 mmol/L (ref 3.5–5.1)
Sodium: 141 mmol/L (ref 135–145)
Total Bilirubin: 0.5 mg/dL (ref 0.0–1.2)
Total Protein: 6.8 g/dL (ref 6.5–8.1)

## 2023-03-29 LAB — CBC WITH DIFFERENTIAL (CANCER CENTER ONLY)
Abs Immature Granulocytes: 0.02 10*3/uL (ref 0.00–0.07)
Basophils Absolute: 0.1 10*3/uL (ref 0.0–0.1)
Basophils Relative: 1 %
Eosinophils Absolute: 0.1 10*3/uL (ref 0.0–0.5)
Eosinophils Relative: 1 %
HCT: 37.2 % (ref 36.0–46.0)
Hemoglobin: 12.6 g/dL (ref 12.0–15.0)
Immature Granulocytes: 0 %
Lymphocytes Relative: 36 %
Lymphs Abs: 1.8 10*3/uL (ref 0.7–4.0)
MCH: 32.6 pg (ref 26.0–34.0)
MCHC: 33.9 g/dL (ref 30.0–36.0)
MCV: 96.1 fL (ref 80.0–100.0)
Monocytes Absolute: 0.5 10*3/uL (ref 0.1–1.0)
Monocytes Relative: 9 %
Neutro Abs: 2.7 10*3/uL (ref 1.7–7.7)
Neutrophils Relative %: 53 %
Platelet Count: 237 10*3/uL (ref 150–400)
RBC: 3.87 MIL/uL (ref 3.87–5.11)
RDW: 12.3 % (ref 11.5–15.5)
WBC Count: 5.1 10*3/uL (ref 4.0–10.5)
nRBC: 0 % (ref 0.0–0.2)

## 2023-03-29 LAB — TSH: TSH: 1.792 u[IU]/mL (ref 0.350–4.500)

## 2023-03-29 MED ORDER — SODIUM CHLORIDE 0.9 % IV SOLN
400.0000 mg | Freq: Once | INTRAVENOUS | Status: AC
  Administered 2023-03-29: 400 mg via INTRAVENOUS
  Filled 2023-03-29: qty 16

## 2023-03-29 MED ORDER — HEPARIN SOD (PORK) LOCK FLUSH 100 UNIT/ML IV SOLN
500.0000 [IU] | Freq: Once | INTRAVENOUS | Status: AC | PRN
  Administered 2023-03-29: 500 [IU]

## 2023-03-29 MED ORDER — SODIUM CHLORIDE 0.9% FLUSH
10.0000 mL | Freq: Once | INTRAVENOUS | Status: AC
  Administered 2023-03-29: 10 mL

## 2023-03-29 MED ORDER — SODIUM CHLORIDE 0.9 % IV SOLN: Freq: Once | INTRAVENOUS | Status: AC

## 2023-03-29 MED ORDER — SODIUM CHLORIDE 0.9% FLUSH
10.0000 mL | INTRAVENOUS | Status: DC | PRN
Start: 2023-03-29 — End: 2023-03-29
  Administered 2023-03-29: 10 mL

## 2023-03-29 NOTE — Patient Instructions (Signed)
[facility] - A DEPT OF [facility]  Discharge Instructions: Thank you for choosing [facility] to provide your oncology and hematology care.   If you have a lab appointment with the Cancer Center, please go directly to the Cancer Center and check in at the registration area.   Wear comfortable clothing and clothing appropriate for easy access to any Portacath or PICC line.   We strive to give you quality time with your provider. You may need to reschedule your appointment if you arrive late (15 or more minutes).  Arriving late affects you and other patients whose appointments are after yours.  Also, if you miss three or more appointments without notifying the office, you may be dismissed from the clinic at the provider's discretion.      For prescription refill requests, have your pharmacy contact our office and allow 72 hours for refills to be completed.    Today you received the following chemotherapy and/or immunotherapy agents: Keytruda      To help prevent nausea and vomiting after your treatment, we encourage you to take your nausea medication as directed.  BELOW ARE SYMPTOMS THAT SHOULD BE REPORTED IMMEDIATELY: *FEVER GREATER THAN 100.4 F (38 C) OR HIGHER *CHILLS OR SWEATING *NAUSEA AND VOMITING THAT IS NOT CONTROLLED WITH YOUR NAUSEA MEDICATION *UNUSUAL SHORTNESS OF BREATH *UNUSUAL BRUISING OR BLEEDING *URINARY PROBLEMS (pain or burning when urinating, or frequent urination) *BOWEL PROBLEMS (unusual diarrhea, constipation, pain near the anus) TENDERNESS IN MOUTH AND THROAT WITH OR WITHOUT PRESENCE OF ULCERS (sore throat, sores in mouth, or a toothache) UNUSUAL RASH, SWELLING OR PAIN  UNUSUAL VAGINAL DISCHARGE OR ITCHING   Items with * indicate a potential emergency and should be followed up as soon as possible or go to the Emergency Department if any problems should occur.  Please show the CHEMOTHERAPY ALERT CARD or IMMUNOTHERAPY  ALERT CARD at check-in to the Emergency Department and triage nurse.  Should you have questions after your visit or need to cancel or reschedule your appointment, please contact [facility] - A DEPT OF [facility]  Dept: [phone]  and follow the prompts.  Office hours are 8:00 a.m. to 4:30 p.m. Monday - Friday. Please note that voicemails left after 4:00 p.m. may not be returned until the following business day.  We are closed weekends and major holidays. You have access to a nurse at all times for urgent questions. Please call the main number to the clinic Dept: [phone] and follow the prompts.   For any non-urgent questions, you may also contact your provider using MyChart. We now offer e-Visits for anyone 107 and older to request care online for non-urgent symptoms. For details visit [email or web address].   Also download the MyChart app! Go to the app store, search "MyChart", open the app, select [facility], and log in with your MyChart username and password.

## 2023-03-30 LAB — T4: T4, Total: 6.3 ug/dL (ref 4.5–12.0)

## 2023-03-31 ENCOUNTER — Inpatient Hospital Stay: Payer: PPO | Admitting: Adult Health

## 2023-04-01 ENCOUNTER — Other Ambulatory Visit: Payer: Self-pay

## 2023-04-04 ENCOUNTER — Encounter: Payer: Self-pay | Admitting: Hematology and Oncology

## 2023-04-06 ENCOUNTER — Other Ambulatory Visit: Payer: Self-pay | Admitting: Hematology and Oncology

## 2023-04-07 ENCOUNTER — Telehealth: Payer: Self-pay | Admitting: Hematology and Oncology

## 2023-04-07 NOTE — Telephone Encounter (Signed)
 Left patient a vm regarding upcoming appointment

## 2023-04-08 ENCOUNTER — Other Ambulatory Visit: Payer: Self-pay

## 2023-05-10 ENCOUNTER — Inpatient Hospital Stay: Payer: PPO | Admitting: Hematology and Oncology

## 2023-05-10 ENCOUNTER — Inpatient Hospital Stay: Payer: PPO | Attending: Hematology and Oncology

## 2023-05-10 ENCOUNTER — Inpatient Hospital Stay: Payer: PPO

## 2023-05-10 VITALS — BP 119/77 | HR 80 | Temp 97.6°F | Resp 17 | Wt 155.8 lb

## 2023-05-10 DIAGNOSIS — Z1722 Progesterone receptor negative status: Secondary | ICD-10-CM | POA: Insufficient documentation

## 2023-05-10 DIAGNOSIS — C799 Secondary malignant neoplasm of unspecified site: Secondary | ICD-10-CM | POA: Diagnosis not present

## 2023-05-10 DIAGNOSIS — Z1732 Human epidermal growth factor receptor 2 negative status: Secondary | ICD-10-CM | POA: Insufficient documentation

## 2023-05-10 DIAGNOSIS — Z171 Estrogen receptor negative status [ER-]: Secondary | ICD-10-CM | POA: Diagnosis not present

## 2023-05-10 DIAGNOSIS — Z5112 Encounter for antineoplastic immunotherapy: Secondary | ICD-10-CM | POA: Diagnosis present

## 2023-05-10 DIAGNOSIS — C50911 Malignant neoplasm of unspecified site of right female breast: Secondary | ICD-10-CM | POA: Diagnosis present

## 2023-05-10 DIAGNOSIS — Z95828 Presence of other vascular implants and grafts: Secondary | ICD-10-CM

## 2023-05-10 DIAGNOSIS — C784 Secondary malignant neoplasm of small intestine: Secondary | ICD-10-CM | POA: Insufficient documentation

## 2023-05-10 LAB — CBC WITH DIFFERENTIAL (CANCER CENTER ONLY)
Abs Immature Granulocytes: 0.01 10*3/uL (ref 0.00–0.07)
Basophils Absolute: 0 10*3/uL (ref 0.0–0.1)
Basophils Relative: 1 %
Eosinophils Absolute: 0 10*3/uL (ref 0.0–0.5)
Eosinophils Relative: 1 %
HCT: 38.8 % (ref 36.0–46.0)
Hemoglobin: 13.2 g/dL (ref 12.0–15.0)
Immature Granulocytes: 0 %
Lymphocytes Relative: 30 %
Lymphs Abs: 1.3 10*3/uL (ref 0.7–4.0)
MCH: 32.8 pg (ref 26.0–34.0)
MCHC: 34 g/dL (ref 30.0–36.0)
MCV: 96.3 fL (ref 80.0–100.0)
Monocytes Absolute: 0.4 10*3/uL (ref 0.1–1.0)
Monocytes Relative: 9 %
Neutro Abs: 2.6 10*3/uL (ref 1.7–7.7)
Neutrophils Relative %: 59 %
Platelet Count: 218 10*3/uL (ref 150–400)
RBC: 4.03 MIL/uL (ref 3.87–5.11)
RDW: 12 % (ref 11.5–15.5)
WBC Count: 4.4 10*3/uL (ref 4.0–10.5)
nRBC: 0 % (ref 0.0–0.2)

## 2023-05-10 LAB — CMP (CANCER CENTER ONLY)
ALT: 15 U/L (ref 0–44)
AST: 22 U/L (ref 15–41)
Albumin: 4.4 g/dL (ref 3.5–5.0)
Alkaline Phosphatase: 67 U/L (ref 38–126)
Anion gap: 5 (ref 5–15)
BUN: 20 mg/dL (ref 8–23)
CO2: 30 mmol/L (ref 22–32)
Calcium: 9.1 mg/dL (ref 8.9–10.3)
Chloride: 104 mmol/L (ref 98–111)
Creatinine: 0.75 mg/dL (ref 0.44–1.00)
GFR, Estimated: >60 mL/min (ref >60)
Glucose, Bld: 112 mg/dL — ABNORMAL HIGH (ref 70–99)
Potassium: 4.3 mmol/L (ref 3.5–5.1)
Sodium: 139 mmol/L (ref 135–145)
Total Bilirubin: 0.5 mg/dL (ref 0.0–1.2)
Total Protein: 6.7 g/dL (ref 6.5–8.1)

## 2023-05-10 MED ORDER — SODIUM CHLORIDE 0.9 % IV SOLN
400.0000 mg | Freq: Once | INTRAVENOUS | Status: AC
  Administered 2023-05-10: 400 mg via INTRAVENOUS
  Filled 2023-05-10: qty 16

## 2023-05-10 MED ORDER — SODIUM CHLORIDE 0.9 % IV SOLN: Freq: Once | INTRAVENOUS | Status: AC

## 2023-05-10 MED ORDER — SODIUM CHLORIDE 0.9% FLUSH
10.0000 mL | Freq: Once | INTRAVENOUS | Status: AC
  Administered 2023-05-10: 10 mL

## 2023-05-10 NOTE — Assessment & Plan Note (Signed)
 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   Outpatient PET/CT showed 2 short segments of small bowel with mild wall thickening and increased radiotracer uptake within the left upper quadrant of abdominal and lower pelvis.  Findings may represent known small bowel metastasis or additional sites of disease   She underwent surgical resection, two small bowel masses, neg margins, once again path with metastatic breast cancer likely, triple neg. Foundation one showed CPS 60%, PIK3CA mutation, no other targets. We have discussed about considering immunotherapy maintenance for at least a year and periodic imaging.   She is now on maintenance immunotherapy, tolerates extremely well.  Metastatic breast cancer Metastatic breast cancer with favorable PET scan results. Undergoing immunotherapy , will complete a yr in April 2025. Discussed Guardant Reveal blood test for monitoring recurrence, not FDA approved yet, She agreed to use blood test and CT scans for monitoring. - Continue immunotherapy until April. - Order Guardant Reveal blood test in April and October. - Order CT scans twice a year. - Provided information about Guardant Reveal blood test.  [facility] in place for treatments, considering retention post-treatment. Requires flushing every eight weeks if retained. - Consider keeping the port and schedule flushes every eight weeks if retained.  Arthritis Arthritis flaring due to treatment side effect. Managed with Voltaren and Tylenol. - Continue using Voltaren and Tylenol as  needed for arthritis symptoms.  Follow-up Planning travel to [place] in May. Follow-up needed before trip. Last treatment scheduled for April 22nd. - Schedule follow-up appointment before her trip to [place]. - Ensure last treatment is completed on April 22nd.

## 2023-05-10 NOTE — Progress Notes (Signed)
 [facility] Cancer Follow up:    [name], MD [address] [place] [address]   DIAGNOSIS:  Cancer Staging  No matching staging information was found for the patient.   SUMMARY OF ONCOLOGIC HISTORY: Oncology History  Metastatic carcinoma (HCC)  10/2021 Genetic Testing   Negative. 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  Genes tested: AIP, ALK, APC, ATM, AXIN2, BAP1, BARD1, BLM, BMPR1A, BRCA1, BRCA2, BRIP1, CDC73, CDH1, CDK4, CDKN1B, CDKN2A, CHEK2, CTNNA1, DICER1, FANCC, FH, FLCN, GALNT12, KIF1B, LZTR1, MAX, MEN1, MET, MLH1, MSH2, MSH3, MSH6, MUTYH, NBN, NF1, NF2, NTHL1, PALB2, PHOX2B, PMS2, POT1, PRKAR1A, PTCH1, PTEN, RAD51C, RAD51D, RB1, RECQL, RET, SDHA, SDHAF2, SDHB, SDHC, SDHD, SMAD4, SMARCA4, SMARCB1, SMARCE1, STK11, SUFU, TMEM127, TP53, TSC1, TSC2, VHL and XRCC2 (sequencing and deletion/duplication); EGFR, EGLN1, HOXB13, KIT, MITF, PDGFRA, POLD1, and POLE (sequencing only); EPCAM and GREM1 (deletion/duplication only).    04/08/2022 Initial Biopsy   [facility] Enteroscopy and small bowel mass biopsy: The enteroscopy showed normal esophagus, stomach and examined duodenum.  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  Histology showed sheets to partially nested neoplastic cells with expression of CAM5.2 and GATA3, negative for CDX2, could represent metastatic breast cancer.  ER negative, PR negative, HER2 2+, FISH negative.    04/14/2022 Initial Biopsy   Omental biopsy: benign omental tissue   05/02/2022 PET scan   IMPRESSION: 1. There are 2 short segments of small bowel with mild wall thickening and increased radiotracer uptake within the left upper quadrant of the abdomen and lower pelvis. Findings may represent known small-bowel metastasis and/or additional sites of disease. 2. No tracer avid nodal metastasis or solid organ metastasis. 3.  Aortic  Atherosclerosis (ICD10-I70.0).     Electronically Signed   By: Signa Kell M.D.   On: 05/02/2022 09:49   05/17/2022 Surgery   Small bowel resection: Poorly differentiated carcinoma, 3.7cm; margins negative and 5.3cm margins negative, 1LN negative for cancer, likely breast primary   05/17/2022 Pathology Results   Foundation 1: PD-L1 CPS 60, PI3KCA mutation   06/22/2022 -  Chemotherapy   Patient is on Treatment Plan : BREAST Pembrolizumab (200) q21d x 24 months       CURRENT THERAPY: Keytruda  INTERVAL HISTORY:  Tracia Lacomb 68 y.o. female returns for follow-up and evaluation prior to receiving Keytruda.    Discussed the use of AI scribe software for clinical note transcription with the patient, who gave verbal consent to proceed.  History of Present Illness    The patient, with metastatic breast cancer, presents for follow-up.  A recent PET scan showed very low-level activity that appears reactive, and overall, it was a good report. She is not currently worried about the results.  She experiences arthritis flare-ups, which she attributes to a side effect of her current treatment. She manages the symptoms with Voltaren and Tylenol as needed. Being retired allows her to rest on days when the arthritis is particularly bothersome. No new side effects apart from the arthritis flare-ups.  She is planning to travel to [place] in mid to late May to visit her daughter, Zollie Scale, who recently had a baby boy. She is excited about the trip and mentions that her family has a pattern of being born on the 26th of various months.  She is interested in the Guardant reveal to continue monitoring mRD   Patient Active Problem List   Diagnosis Date Noted  Port-A-Cath in place 07/13/2022   Metastatic carcinoma (HCC) 06/06/2022   Small bowel mass 05/17/2022   SBO (small bowel obstruction) (HCC) 03/31/2022   Genetic testing 11/10/2021   Family history of breast cancer 10/27/2021    Family history of colon cancer 10/27/2021   S/P breast reconstruction, bilateral 09/30/2019   S/P mastectomy, bilateral 04/13/2019   Weight gain 09/09/2013   Encounter for counseling 11/25/2012   Screening for malignant neoplasm of cervix 11/25/2012   Menopause 10/08/2012   Vitamin D deficiency 10/08/2012   Hyperlipemia 10/08/2012   Osteoarthritis 06/04/2012    has no known allergies.  MEDICAL HISTORY: Past Medical History:  Diagnosis Date   Arthritis    Backache, unspecified    Cancer (HCC)    breast s/p BL mastectomies   Colon polyps    Family history of breast cancer 10/27/2021   Family history of colon cancer 10/27/2021   Hyperlipidemia    Mitral valve prolapse of mother during pregnancy    PONV (postoperative nausea and vomiting)     SURGICAL HISTORY: Past Surgical History:  Procedure Laterality Date   BOWEL RESECTION N/A 05/17/2022   Procedure: SMALL BOWEL RESECTION;  Surgeon: Quentin Ore, MD;  Location: [facility];  Service: General;  Laterality: N/A;   BREAST RECONSTRUCTION WITH PLACEMENT OF TISSUE EXPANDER AND FLEX HD (ACELLULAR HYDRATED DERMIS) Bilateral 04/05/2019   Procedure: BILATERAL BREAST RECONSTRUCTION WITH PLACEMENT OF TISSUE EXPANDER AND FLEX HD (ACELLULAR HYDRATED DERMIS);  Surgeon: Peggye Form, DO;  Location: [facility];  Service: Plastics;  Laterality: Bilateral;   ENDOMETRIAL ABLATION  1998   GIVENS CAPSULE STUDY N/A 12/04/2021   Procedure: GIVENS CAPSULE STUDY;  Surgeon: Charna Elizabeth, MD;  Location: [facility];  Service: Gastroenterology;  Laterality: N/A;   IR IMAGING GUIDED PORT INSERTION  07/12/2022   KNEE SURGERY Right 2007   LAPAROSCOPY N/A 04/14/2022   Procedure: LAPAROSCOPY DIAGNOSTIC, G-TUBE PLACEMENT; OMENTUM BIOPSY; LYSIS OF ADHESIONS;  Surgeon: Quentin Ore, MD;  Location: [facility];  Service: General;  Laterality: N/A;   LIPOSUCTION WITH LIPOFILLING Bilateral 01/30/2020   Procedure: LIPOSUCTION WITH  LIPOFILLING;  Surgeon: Peggye Form, DO;  Location: [facility];  Service: Plastics;  Laterality: Bilateral;  90 min, please   MASTECTOMY W/ SENTINEL NODE BIOPSY Bilateral 04/05/2019   Procedure: BILATERAL MASTECTOMIES WITH RIGHT SENTINEL LYMPH NODE BIOPSY;  Surgeon: Griselda Miner, MD;  Location: [facility];  Service: General;  Laterality: Bilateral;   REMOVAL OF BILATERAL TISSUE EXPANDERS WITH PLACEMENT OF BILATERAL BREAST IMPLANTS Bilateral 09/26/2019   Procedure: REMOVAL OF BILATERAL TISSUE EXPANDERS WITH PLACEMENT OF BILATERAL BREAST IMPLANTS;  Surgeon: Peggye Form, DO;  Location: [facility];  Service: Plastics;  Laterality: Bilateral;   RHINOPLASTY  1977   TONSILLECTOMY  1961    SOCIAL HISTORY: Social History   Socioeconomic History   Marital status: Married    Spouse name: Actor   Number of children: 3   Years of education: 16+   Highest education level: Not on file  Occupational History    Employer: [employer]   Occupation: Herbalist  Tobacco Use   Smoking status: Never   Smokeless tobacco: Never  Vaping Use   Vaping status: Never Used  Substance and Sexual Activity   Alcohol use: Not Currently    Comment: 2-3/week   Drug use: No   Sexual activity: Yes    Partners: Male  Other Topics Concern   Not on file  Social History Narrative   Marital Status:  Married Midwife)    Children:  G3 P3 Daughters (Lauren, Valma Cava, [place])    Pets:  Poodle [facility]); Cats Billey Gosling, Delorise Shiner)    Living Situation: Lives with spouse.  Her daughters live in [place].    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[employer])    Education:  Best boy)    Tobacco Use/Exposure:  None    Alcohol Use:  Occasional   Drug Use:  None   Diet:  Regular   Exercise:  Walking   Hobbies:  Gardening, Shopping              Social Drivers of Health    Financial Resource Strain: Low Risk  (04/07/2022)   Received from [facility], [facility]   Overall Financial Resource Strain (CARDIA)    Difficulty of Paying Living Expenses: Not hard at all  Food Insecurity: No Food Insecurity (04/13/2022)   Hunger Vital Sign    Worried About Running Out of Food in the Last Year: Never true    Ran Out of Food in the Last Year: Never true  Transportation Needs: No Transportation Needs (04/13/2022)   PRAPARE - Administrator, Civil Service (Medical): No    Lack of Transportation (Non-Medical): No  Physical Activity: Sufficiently Active (04/07/2022)   [facility]   Exercise Vital Sign    Days of Exercise per Week: 4 days    Minutes of Exercise per Session: 60 min  Stress: Stress Concern Present (04/07/2022)   Received from [facility], [facility] - Occupational Stress Questionnaire    Feeling of Stress : To some extent  Social Connections: Socially Integrated (04/07/2022)   Received from [facility], [facility]   Social Network    How would you rate your social network (family, work, friends)?: Good participation with social networks  Intimate Partner Violence: Not At Risk (04/13/2022)   Humiliation, Afraid, Rape, and Kick questionnaire    Fear of Current or Ex-Partner: No    Emotionally Abused: No    Physically Abused: No    Sexually Abused: No    FAMILY HISTORY: Family History  Problem Relation Age of Onset   Arthritis Mother    Colon cancer Mother 49   Melanoma Father        dx 69s; nose   Lung cancer Paternal Uncle        dx after 40; smoking hx   Breast cancer Maternal Grandmother        dx 86s   Hyperlipidemia Paternal Grandmother    Cancer Paternal Grandfather        unknown type; d. before 52   Colon cancer Other        MGM's mother    Review of Systems  Constitutional:  Negative for appetite change, chills, fatigue, fever and  unexpected weight change.  HENT:   Negative for hearing loss, lump/mass and trouble swallowing.   Eyes:  Negative for eye problems and icterus.  Respiratory:  Negative for chest tightness, cough and shortness of breath.   Cardiovascular:  Negative for chest pain, leg swelling and palpitations.  Gastrointestinal:  Negative for abdominal distention, abdominal pain, constipation, diarrhea, nausea and vomiting.  Endocrine: Negative for hot flashes.  Genitourinary:  Negative for difficulty urinating.   Musculoskeletal:  Negative for arthralgias.  Skin:  Negative for itching and  rash.  Neurological:  Negative for dizziness, extremity weakness, headaches and numbness.  Hematological:  Negative for adenopathy. Does not bruise/bleed easily.  Psychiatric/Behavioral:  Negative for depression. The patient is not nervous/anxious.       PHYSICAL EXAMINATION    Vitals:   05/10/23 1208  BP: 119/77  Pulse: 80  Resp: 17  Temp: 97.6 F (36.4 C)  SpO2: 100%    Physical Exam Constitutional:      General: She is not in acute distress.    Appearance: Normal appearance. She is not toxic-appearing.  HENT:     Head: Normocephalic and atraumatic.  Eyes:     General: No scleral icterus. Cardiovascular:     Rate and Rhythm: Normal rate and regular rhythm.     Pulses: Normal pulses.     Heart sounds: Normal heart sounds.  Pulmonary:     Effort: Pulmonary effort is normal.     Breath sounds: Normal breath sounds.  Abdominal:     General: Abdomen is flat. Bowel sounds are normal. There is no distension.     Palpations: Abdomen is soft.     Tenderness: There is no abdominal tenderness.  Musculoskeletal:        General: No swelling.     Cervical back: Neck supple.  Lymphadenopathy:     Cervical: No cervical adenopathy.  Skin:    General: Skin is warm and dry.     Findings: No rash.  Neurological:     General: No focal deficit present.     Mental Status: She is alert.  Psychiatric:         Mood and Affect: Mood normal.        Behavior: Behavior normal.     LABORATORY DATA:  CBC    Component Value Date/Time   WBC 4.4 05/10/2023 1152   WBC 11.5 (H) 05/18/2022 0510   RBC 4.03 05/10/2023 1152   HGB 13.2 05/10/2023 1152   HCT 38.8 05/10/2023 1152   PLT 218 05/10/2023 1152   MCV 96.3 05/10/2023 1152   MCH 32.8 05/10/2023 1152   MCHC 34.0 05/10/2023 1152   RDW 12.0 05/10/2023 1152   LYMPHSABS 1.3 05/10/2023 1152   MONOABS 0.4 05/10/2023 1152   EOSABS 0.0 05/10/2023 1152   BASOSABS 0.0 05/10/2023 1152    CMP     Component Value Date/Time   NA 139 05/10/2023 1152   K 4.3 05/10/2023 1152   CL 104 05/10/2023 1152   CO2 30 05/10/2023 1152   GLUCOSE 112 (H) 05/10/2023 1152   BUN 20 05/10/2023 1152   CREATININE 0.75 05/10/2023 1152   CREATININE 0.66 08/28/2012 1400   CALCIUM 9.1 05/10/2023 1152   PROT 6.7 05/10/2023 1152   ALBUMIN 4.4 05/10/2023 1152   AST 22 05/10/2023 1152   ALT 15 05/10/2023 1152   ALKPHOS 67 05/10/2023 1152   BILITOT 0.5 05/10/2023 1152   GFRNONAA >60 05/10/2023 1152   GFRNONAA >89 08/28/2012 1400   GFRAA >60 01/17/2019 1301   GFRAA >89 08/28/2012 1400       ASSESSMENT and THERAPY PLAN:   Metastatic carcinoma (HCC) This is a very pleasant 68 year old postmenopausal female patient with 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  Outpatient PET/CT showed 2 short segments of small bowel with mild wall thickening and increased radiotracer uptake within the left upper quadrant of abdominal and lower pelvis.  Findings may represent known small bowel metastasis or additional sites of disease   She  underwent surgical resection, two small bowel masses, neg margins, once again path with metastatic breast cancer likely, triple neg. Foundation one showed CPS 60%, PIK3CA mutation, no other targets. We have discussed about considering immunotherapy maintenance for at least a year and periodic imaging.   She is now on maintenance immunotherapy, tolerates extremely well.  Metastatic breast cancer Metastatic breast cancer with favorable PET scan results. Undergoing immunotherapy , will complete a yr in April 2025. Discussed Guardant Reveal blood test for monitoring recurrence, not FDA approved yet, She agreed to use blood test and CT scans for monitoring. - Continue immunotherapy until April. - Order Guardant Reveal blood test in April and October. - Order CT scans twice a year. - Provided information about Guardant Reveal blood test.  [facility] in place for treatments, considering retention post-treatment. Requires flushing every eight weeks if retained. - Consider keeping the port and schedule flushes every eight weeks if retained.  Arthritis Arthritis flaring due to treatment side effect. Managed with Voltaren and Tylenol. - Continue using Voltaren and Tylenol as needed for arthritis symptoms.  Follow-up Planning travel to [place] in May. Follow-up needed before trip. Last treatment scheduled for April 22nd. - Schedule follow-up appointment before her trip to [place]. - Ensure last treatment is completed on April 22nd.    All questions were answered. The patient knows to call the clinic with any problems, questions or concerns. We can certainly see the patient much sooner if necessary.  Total encounter time:30 minutes*in face-to-face visit time, chart review, lab review, care coordination, order entry, and documentation of the encounter time.  *Total Encounter Time as defined by the Centers for Medicare and Medicaid Services includes, in addition to the face-to-face  time of a patient visit (documented in the note above) non-face-to-face time: obtaining and reviewing outside history, ordering and reviewing medications, tests or procedures, care coordination (communications with other health care professionals or caregivers) and documentation in the medical record.

## 2023-05-13 ENCOUNTER — Other Ambulatory Visit: Payer: Self-pay

## 2023-05-16 ENCOUNTER — Telehealth: Payer: Self-pay | Admitting: *Deleted

## 2023-05-18 ENCOUNTER — Other Ambulatory Visit: Payer: Self-pay

## 2023-05-25 ENCOUNTER — Encounter: Payer: Self-pay | Admitting: Hematology and Oncology

## 2023-05-25 NOTE — Telephone Encounter (Signed)
 Pt needs port flush with labs ( from other MD office) will send scheduling request- pt will bring in order request.

## 2023-05-26 ENCOUNTER — Encounter: Payer: Self-pay | Admitting: Hematology and Oncology

## 2023-05-27 ENCOUNTER — Ambulatory Visit (INDEPENDENT_AMBULATORY_CARE_PROVIDER_SITE_OTHER): Payer: Self-pay | Admitting: Surgical

## 2023-05-27 DIAGNOSIS — Z719 Counseling, unspecified: Secondary | ICD-10-CM

## 2023-05-27 DIAGNOSIS — Z411 Encounter for cosmetic surgery: Secondary | ICD-10-CM

## 2023-05-27 NOTE — Progress Notes (Signed)
 Botulinum Toxin Procedure Note  Procedure: Cosmetic botulinum toxin  Pre-operative Diagnosis: Dynamic rhytides  Post-operative Diagnosis: Same  Complications:  None  Brief history: The patient desires botulinum toxin injection.  She is aware of the risks including bleeding, damage to deeper structures, asymmetry, brow ptosis, eyelid ptosis, bruising. The patient understands and wishes to proceed.  Procedure: The area was prepped with alcohol and dried with a clean gauze.  Using a clean technique the botulinum toxin was diluted with 2.5 mL of bacteriostatic saline per 100 unit vial which resulted in 4 units per 0.1 mL.  Subsequently the mixture was injected in the glabellar, lateral canthal lines, forehead area with preservation of the temporal branch to the lateral eyebrow. A total of 37 Units of botulinum toxin was used. The forehead and glabellar area was injected with care to inject intramuscular only while holding pressure on the supratrochlear vessels in each area during each injection on either side of the medial corrugators. The injection proceeded vertically superiorly to the medial 2/3 of the frontalis muscle and superior 2/3 of the lateral frontalis, again with preservation of the frontal branch.  No complications were noted. Light pressure was held for 5 minutes. She was instructed explicitly in post-operative care.  Botox LOT:  [id number] EXP:  04/2025

## 2023-06-02 ENCOUNTER — Telehealth: Payer: Self-pay

## 2023-06-02 NOTE — Telephone Encounter (Signed)
 Pt called and LVM following up on her Mychart message. She needs labs done and her MD sent lab orders.

## 2023-06-06 ENCOUNTER — Encounter: Payer: Self-pay | Admitting: Hematology and Oncology

## 2023-06-06 ENCOUNTER — Inpatient Hospital Stay: Attending: Hematology and Oncology

## 2023-06-06 DIAGNOSIS — Z452 Encounter for adjustment and management of vascular access device: Secondary | ICD-10-CM | POA: Insufficient documentation

## 2023-06-06 DIAGNOSIS — Z95828 Presence of other vascular implants and grafts: Secondary | ICD-10-CM

## 2023-06-06 DIAGNOSIS — C784 Secondary malignant neoplasm of small intestine: Secondary | ICD-10-CM | POA: Diagnosis present

## 2023-06-06 DIAGNOSIS — Z1722 Progesterone receptor negative status: Secondary | ICD-10-CM | POA: Diagnosis not present

## 2023-06-06 DIAGNOSIS — Z1732 Human epidermal growth factor receptor 2 negative status: Secondary | ICD-10-CM | POA: Insufficient documentation

## 2023-06-06 DIAGNOSIS — Z171 Estrogen receptor negative status [ER-]: Secondary | ICD-10-CM | POA: Insufficient documentation

## 2023-06-06 DIAGNOSIS — C50911 Malignant neoplasm of unspecified site of right female breast: Secondary | ICD-10-CM | POA: Diagnosis present

## 2023-06-06 DIAGNOSIS — Z5112 Encounter for antineoplastic immunotherapy: Secondary | ICD-10-CM | POA: Insufficient documentation

## 2023-06-06 MED ORDER — HEPARIN SOD (PORK) LOCK FLUSH 100 UNIT/ML IV SOLN
500.0000 [IU] | Freq: Once | INTRAVENOUS | Status: AC
Start: 2023-06-06 — End: 2023-06-06
  Administered 2023-06-06: 500 [IU]

## 2023-06-06 MED ORDER — SODIUM CHLORIDE 0.9% FLUSH
10.0000 mL | Freq: Once | INTRAVENOUS | Status: AC
  Administered 2023-06-06: 10 mL

## 2023-06-06 NOTE — Patient Instructions (Signed)

## 2023-06-15 ENCOUNTER — Other Ambulatory Visit: Payer: Self-pay

## 2023-06-21 ENCOUNTER — Ambulatory Visit: Payer: PPO | Admitting: Hematology and Oncology

## 2023-06-21 ENCOUNTER — Inpatient Hospital Stay (HOSPITAL_BASED_OUTPATIENT_CLINIC_OR_DEPARTMENT_OTHER): Admitting: Hematology and Oncology

## 2023-06-21 ENCOUNTER — Ambulatory Visit: Payer: PPO

## 2023-06-21 ENCOUNTER — Other Ambulatory Visit: Payer: Self-pay

## 2023-06-21 ENCOUNTER — Inpatient Hospital Stay

## 2023-06-21 ENCOUNTER — Telehealth: Payer: Self-pay | Admitting: *Deleted

## 2023-06-21 ENCOUNTER — Other Ambulatory Visit: Payer: Self-pay | Admitting: *Deleted

## 2023-06-21 ENCOUNTER — Other Ambulatory Visit: Payer: PPO

## 2023-06-21 VITALS — BP 132/93 | HR 76 | Temp 97.8°F | Resp 17 | Wt 156.0 lb

## 2023-06-21 DIAGNOSIS — Z5112 Encounter for antineoplastic immunotherapy: Secondary | ICD-10-CM | POA: Diagnosis not present

## 2023-06-21 DIAGNOSIS — C799 Secondary malignant neoplasm of unspecified site: Secondary | ICD-10-CM

## 2023-06-21 DIAGNOSIS — Z95828 Presence of other vascular implants and grafts: Secondary | ICD-10-CM

## 2023-06-21 DIAGNOSIS — C50911 Malignant neoplasm of unspecified site of right female breast: Secondary | ICD-10-CM

## 2023-06-21 LAB — CMP (CANCER CENTER ONLY)
ALT: 13 U/L (ref 0–44)
AST: 18 U/L (ref 15–41)
Albumin: 4.2 g/dL (ref 3.5–5.0)
Alkaline Phosphatase: 67 U/L (ref 38–126)
Anion gap: 3 — ABNORMAL LOW (ref 5–15)
BUN: 15 mg/dL (ref 8–23)
CO2: 31 mmol/L (ref 22–32)
Calcium: 9.1 mg/dL (ref 8.9–10.3)
Chloride: 107 mmol/L (ref 98–111)
Creatinine: 0.77 mg/dL (ref 0.44–1.00)
GFR, Estimated: >60 mL/min (ref >60)
Glucose, Bld: 100 mg/dL — ABNORMAL HIGH (ref 70–99)
Potassium: 4.2 mmol/L (ref 3.5–5.1)
Sodium: 141 mmol/L (ref 135–145)
Total Bilirubin: 0.4 mg/dL (ref 0.0–1.2)
Total Protein: 6.5 g/dL (ref 6.5–8.1)

## 2023-06-21 LAB — CBC WITH DIFFERENTIAL (CANCER CENTER ONLY)
Abs Immature Granulocytes: 0.02 10*3/uL (ref 0.00–0.07)
Basophils Absolute: 0 10*3/uL (ref 0.0–0.1)
Basophils Relative: 1 %
Eosinophils Absolute: 0.1 10*3/uL (ref 0.0–0.5)
Eosinophils Relative: 1 %
HCT: 37.2 % (ref 36.0–46.0)
Hemoglobin: 12.7 g/dL (ref 12.0–15.0)
Immature Granulocytes: 0 %
Lymphocytes Relative: 29 %
Lymphs Abs: 1.6 10*3/uL (ref 0.7–4.0)
MCH: 33 pg (ref 26.0–34.0)
MCHC: 34.1 g/dL (ref 30.0–36.0)
MCV: 96.6 fL (ref 80.0–100.0)
Monocytes Absolute: 0.5 10*3/uL (ref 0.1–1.0)
Monocytes Relative: 9 %
Neutro Abs: 3.3 10*3/uL (ref 1.7–7.7)
Neutrophils Relative %: 60 %
Platelet Count: 243 10*3/uL (ref 150–400)
RBC: 3.85 MIL/uL — ABNORMAL LOW (ref 3.87–5.11)
RDW: 12.2 % (ref 11.5–15.5)
WBC Count: 5.6 10*3/uL (ref 4.0–10.5)
nRBC: 0 % (ref 0.0–0.2)

## 2023-06-21 MED ORDER — SODIUM CHLORIDE 0.9% FLUSH
10.0000 mL | INTRAVENOUS | Status: DC | PRN
  Administered 2023-06-21: 10 mL

## 2023-06-21 MED ORDER — SODIUM CHLORIDE 0.9 % IV SOLN: Freq: Once | INTRAVENOUS | Status: AC

## 2023-06-21 MED ORDER — HEPARIN SOD (PORK) LOCK FLUSH 100 UNIT/ML IV SOLN
500.0000 [IU] | Freq: Once | INTRAVENOUS | Status: AC | PRN
Start: 2023-06-21 — End: 2023-06-21
  Administered 2023-06-21: 500 [IU]

## 2023-06-21 MED ORDER — SODIUM CHLORIDE 0.9% FLUSH
10.0000 mL | Freq: Once | INTRAVENOUS | Status: AC
  Administered 2023-06-21: 10 mL

## 2023-06-21 MED ORDER — SODIUM CHLORIDE 0.9 % IV SOLN
400.0000 mg | Freq: Once | INTRAVENOUS | Status: AC
  Administered 2023-06-21: 400 mg via INTRAVENOUS
  Filled 2023-06-21: qty 16

## 2023-06-21 NOTE — Progress Notes (Signed)
 [facility] Cancer Follow up:    Caroline Handsome, MD [address] [place] [address]   DIAGNOSIS:  Cancer Staging  No matching staging information was found for the patient.   SUMMARY OF ONCOLOGIC HISTORY: Oncology History  Metastatic carcinoma (HCC)  10/2021 Genetic Testing   Negative. 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  Genes tested: AIP, ALK, APC, ATM, AXIN2, BAP1, BARD1, BLM, BMPR1A, BRCA1, BRCA2, BRIP1, CDC73, CDH1, CDK4, CDKN1B, CDKN2A, CHEK2, CTNNA1, DICER1, FANCC, FH, FLCN, GALNT12, KIF1B, LZTR1, MAX, MEN1, MET, MLH1, MSH2, MSH3, MSH6, MUTYH, NBN, NF1, NF2, NTHL1, PALB2, PHOX2B, PMS2, POT1, PRKAR1A, PTCH1, PTEN, RAD51C, RAD51D, RB1, RECQL, RET, SDHA, SDHAF2, SDHB, SDHC, SDHD, SMAD4, SMARCA4, SMARCB1, SMARCE1, STK11, SUFU, TMEM127, TP53, TSC1, TSC2, VHL and XRCC2 (sequencing and deletion/duplication); EGFR, EGLN1, HOXB13, KIT, MITF, PDGFRA, POLD1, and POLE (sequencing only); EPCAM and GREM1 (deletion/duplication only).    04/08/2022 Initial Biopsy   [facility] Enteroscopy and small bowel mass biopsy: The enteroscopy showed normal esophagus, stomach and examined duodenum.  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  Histology showed sheets to partially nested neoplastic cells with expression of CAM5.2 and GATA3, negative for CDX2, could represent metastatic breast cancer.  ER negative, PR negative, HER2 2+, FISH negative.    04/14/2022 Initial Biopsy   Omental biopsy: benign omental tissue   05/02/2022 PET scan   IMPRESSION: 1. There are 2 short segments of small bowel with mild wall thickening and increased radiotracer uptake within the left upper quadrant of the abdomen and lower pelvis. Findings may represent known small-bowel metastasis and/or additional sites of disease. 2. No tracer avid nodal metastasis or solid organ metastasis. 3.  Aortic  Atherosclerosis (ICD10-I70.0).     Electronically Signed   By: Kimberley Penman M.D.   On: 05/02/2022 09:49   05/17/2022 Surgery   Small bowel resection: Poorly differentiated carcinoma, 3.7cm; margins negative and 5.3cm margins negative, 1LN negative for cancer, likely breast primary   05/17/2022 Pathology Results   Foundation 1: PD-L1 CPS 60, PI3KCA mutation   06/22/2022 -  Chemotherapy   Patient is on Treatment Plan : BREAST Pembrolizumab  (200) q21d x 24 months       CURRENT THERAPY: Keytruda   INTERVAL HISTORY:  [name] 68 y.o. female returns for follow-up and evaluation prior to receiving Keytruda .    Discussed the use of AI scribe software for clinical note transcription with the patient, who gave verbal consent to proceed.  History of Present Illness    The patient, with metastatic breast cancer, presents for follow-up.  Discussed the use of AI scribe software for clinical note transcription with the patient, who gave verbal consent to proceed.  History of Present Illness [name] is a 68 year old female with metastatic breast cancer who presents for immunotherapy and a Guardant reveal.  She is undergoing treatment for metastatic breast cancer with metastasis identified in the small bowel. Her last scan was in December. She is scheduled for immunotherapy today and will have a Guardant reveal to detect tumor DNA. She prefers using her port for lab draws.  She suspects a urinary tract infection that began yesterday morning. She has not had a UTI in a long time and is currently using medication for pain relief. She inquires about starting an antibiotic, mentioning ciprofloxacin  as a potential option.  She experiences arthritis symptoms, particularly in her hands and hip,  describing fingers as 'bigger' with a sensation of discomfort in her hip and back. A PET scan previously showed a small pocket of scar tissue in the area of discomfort.  She is  planning a trip to [place] in May and discusses the expected weather conditions with her daughter, who advises that it will be fall and not as cold as her current location.   Patient Active Problem List   Diagnosis Date Noted   Port-A-Cath in place 07/13/2022   Metastatic carcinoma (HCC) 06/06/2022   Small bowel mass 05/17/2022   SBO (small bowel obstruction) (HCC) 03/31/2022   Genetic testing 11/10/2021   Family history of breast cancer 10/27/2021   Family history of colon cancer 10/27/2021   S/P breast reconstruction, bilateral 09/30/2019   S/P mastectomy, bilateral 04/13/2019   Weight gain 09/09/2013   Encounter for counseling 11/25/2012   Screening for malignant neoplasm of cervix 11/25/2012   Menopause 10/08/2012   Vitamin D  deficiency 10/08/2012   Hyperlipemia 10/08/2012   Osteoarthritis 06/04/2012    has no known allergies.  MEDICAL HISTORY: Past Medical History:  Diagnosis Date   Arthritis    Backache, unspecified    Cancer (HCC)    breast s/p BL mastectomies   Colon polyps    Family history of breast cancer 10/27/2021   Family history of colon cancer 10/27/2021   Hyperlipidemia    Mitral valve prolapse of mother during pregnancy    PONV (postoperative nausea and vomiting)     SURGICAL HISTORY: Past Surgical History:  Procedure Laterality Date   BOWEL RESECTION N/A 05/17/2022   Procedure: SMALL BOWEL RESECTION;  Surgeon: Junie Olds, MD;  Location: [facility];  Service: General;  Laterality: N/A;   BREAST RECONSTRUCTION WITH PLACEMENT OF TISSUE EXPANDER AND FLEX HD (ACELLULAR HYDRATED DERMIS) Bilateral 04/05/2019   Procedure: BILATERAL BREAST RECONSTRUCTION WITH PLACEMENT OF TISSUE EXPANDER AND FLEX HD (ACELLULAR HYDRATED DERMIS);  Surgeon: Thornell Flirt, DO;  Location: [facility];  Service: Plastics;  Laterality: Bilateral;   ENDOMETRIAL ABLATION  1998   GIVENS CAPSULE STUDY N/A 12/04/2021   Procedure: GIVENS CAPSULE STUDY;  Surgeon:  Tami Falcon, MD;  Location: [facility];  Service: Gastroenterology;  Laterality: N/A;   IR IMAGING GUIDED PORT INSERTION  07/12/2022   KNEE SURGERY Right 2007   LAPAROSCOPY N/A 04/14/2022   Procedure: LAPAROSCOPY DIAGNOSTIC, G-TUBE PLACEMENT; OMENTUM BIOPSY; LYSIS OF ADHESIONS;  Surgeon: Junie Olds, MD;  Location: [facility];  Service: General;  Laterality: N/A;   LIPOSUCTION WITH LIPOFILLING Bilateral 01/30/2020   Procedure: LIPOSUCTION WITH LIPOFILLING;  Surgeon: Thornell Flirt, DO;  Location: [facility];  Service: Plastics;  Laterality: Bilateral;  90 min, please   MASTECTOMY W/ SENTINEL NODE BIOPSY Bilateral 04/05/2019   Procedure: BILATERAL MASTECTOMIES WITH RIGHT SENTINEL LYMPH NODE BIOPSY;  Surgeon: Caralyn Chandler, MD;  Location: [facility];  Service: General;  Laterality: Bilateral;   REMOVAL OF BILATERAL TISSUE EXPANDERS WITH PLACEMENT OF BILATERAL BREAST IMPLANTS Bilateral 09/26/2019   Procedure: REMOVAL OF BILATERAL TISSUE EXPANDERS WITH PLACEMENT OF BILATERAL BREAST IMPLANTS;  Surgeon: Thornell Flirt, DO;  Location: [facility];  Service: Plastics;  Laterality: Bilateral;   RHINOPLASTY  1977   TONSILLECTOMY  1961    SOCIAL HISTORY: Social History   Socioeconomic History   Marital status: Married    Spouse name: Lesia Raring   Number of children: 3   Years of education: 16+   Highest education level: Not on file  Occupational History    Employer: [employer]    Occupation: Herbalist  Tobacco Use   Smoking status: Never   Smokeless tobacco: Never  Vaping Use   Vaping status: Never Used  Substance and Sexual Activity   Alcohol use: Not Currently    Comment: 2-3/week   Drug use: No   Sexual activity: Yes    Partners: Male  Other Topics Concern   Not on file  Social History Narrative   Marital Status:  Married Midwife)    Children:  G3 P3 Daughters ([place]ren, [place], [place])    Pets:  Poodle (Mollie); Cats  Dorla Gartner, Carlon Chester)    Living Situation: Lives with spouse.  Her daughters live in [place].    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[employer])    Education:  Best boy)    Tobacco Use/Exposure:  None    Alcohol Use:  Occasional   Drug Use:  None   Diet:  Regular   Exercise:  Walking   Hobbies:  Gardening, Film/video editor              Social Drivers of Health   Financial Resource Strain: Low Risk  (05/23/2023)   Received from [facility]   Overall Financial Resource Strain (CARDIA)    Difficulty of Paying Living Expenses: Not hard at all  Food Insecurity: No Food Insecurity (05/23/2023)   Received from [facility]   Hunger Vital Sign    Worried About Running Out of Food in the Last Year: Never true    Ran Out of Food in the Last Year: Never true  Transportation Needs: No Transportation Needs (05/23/2023)   Received from [facility] - Transportation    Lack of Transportation (Medical): No    Lack of Transportation (Non-Medical): No  Physical Activity: Insufficiently Active (05/23/2023)   Received from [facility]   Exercise Vital Sign    Days of Exercise per Week: 1 day    Minutes of Exercise per Session: 60 min  Stress: No Stress Concern Present (05/23/2023)   Received from [facility] - Occupational Stress Questionnaire    Feeling of Stress : Only a little  Social Connections: Patient Declined (05/23/2023)   Received from [facility]   Social Network    How would you rate your social network (family, work, friends)?: Patient declined  Intimate Partner Violence: Not At Risk (05/23/2023)   Received from [facility]   HITS    Over the last 12 months how often did your partner physically hurt you?: Never    Over the last 12 months how often did your partner insult you or talk down to you?: Never    Over the last 12 months how often did  your partner threaten you with physical harm?: Never    Over the last 12 months how often did your partner scream or curse at you?: Never    FAMILY HISTORY: Family History  Problem Relation Age of Onset   Arthritis Mother    Colon cancer Mother 82   Melanoma Father        dx 58s; nose   Lung cancer Paternal Uncle        dx after 48; smoking hx   Breast cancer Maternal Grandmother        dx 43s   Hyperlipidemia Paternal Grandmother    Cancer Paternal Grandfather  unknown type; d. before 79   Colon cancer Other        MGM's mother    Review of Systems  Constitutional:  Negative for appetite change, chills, fatigue, fever and unexpected weight change.  HENT:   Negative for hearing loss, lump/mass and trouble swallowing.   Eyes:  Negative for eye problems and icterus.  Respiratory:  Negative for chest tightness, cough and shortness of breath.   Cardiovascular:  Negative for chest pain, leg swelling and palpitations.  Gastrointestinal:  Negative for abdominal distention, abdominal pain, constipation, diarrhea, nausea and vomiting.  Endocrine: Negative for hot flashes.  Genitourinary:  Negative for difficulty urinating.   Musculoskeletal:  Negative for arthralgias.  Skin:  Negative for itching and rash.  Neurological:  Negative for dizziness, extremity weakness, headaches and numbness.  Hematological:  Negative for adenopathy. Does not bruise/bleed easily.  Psychiatric/Behavioral:  Negative for depression. The patient is not nervous/anxious.       PHYSICAL EXAMINATION    Vitals:   06/21/23 1342  BP: (!) 132/93  Pulse: 76  Resp: 17  Temp: 97.8 F (36.6 C)  SpO2: 98%    Physical Exam Constitutional:      General: She is not in acute distress.    Appearance: Normal appearance. She is not toxic-appearing.  HENT:     Head: Normocephalic and atraumatic.  Eyes:     General: No scleral icterus. Cardiovascular:     Rate and Rhythm: Normal rate and regular rhythm.      Pulses: Normal pulses.     Heart sounds: Normal heart sounds.  Pulmonary:     Effort: Pulmonary effort is normal.     Breath sounds: Normal breath sounds.  Abdominal:     General: Abdomen is flat. Bowel sounds are normal. There is no distension.     Palpations: Abdomen is soft.     Tenderness: There is no abdominal tenderness.  Musculoskeletal:        General: No swelling.     Cervical back: Neck supple.  Lymphadenopathy:     Cervical: No cervical adenopathy.  Skin:    General: Skin is warm and dry.     Findings: No rash.  Neurological:     General: No focal deficit present.     Mental Status: She is alert.  Psychiatric:        Mood and Affect: Mood normal.        Behavior: Behavior normal.     LABORATORY DATA:  CBC    Component Value Date/Time   WBC 5.6 06/21/2023 1325   WBC 11.5 (H) 05/18/2022 0510   RBC 3.85 (L) 06/21/2023 1325   HGB 12.7 06/21/2023 1325   HCT 37.2 06/21/2023 1325   PLT 243 06/21/2023 1325   MCV 96.6 06/21/2023 1325   MCH 33.0 06/21/2023 1325   MCHC 34.1 06/21/2023 1325   RDW 12.2 06/21/2023 1325   LYMPHSABS 1.6 06/21/2023 1325   MONOABS 0.5 06/21/2023 1325   EOSABS 0.1 06/21/2023 1325   BASOSABS 0.0 06/21/2023 1325    CMP     Component Value Date/Time   NA 141 06/21/2023 1325   K 4.2 06/21/2023 1325   CL 107 06/21/2023 1325   CO2 31 06/21/2023 1325   GLUCOSE 100 (H) 06/21/2023 1325   BUN 15 06/21/2023 1325   CREATININE 0.77 06/21/2023 1325   CREATININE 0.66 08/28/2012 1400   CALCIUM  9.1 06/21/2023 1325   PROT 6.5 06/21/2023 1325   ALBUMIN 4.2 06/21/2023 1325  AST 18 06/21/2023 1325   ALT 13 06/21/2023 1325   ALKPHOS 67 06/21/2023 1325   BILITOT 0.4 06/21/2023 1325   GFRNONAA >60 06/21/2023 1325   GFRNONAA >89 08/28/2012 1400   GFRAA >60 01/17/2019 1301   GFRAA >89 08/28/2012 1400       ASSESSMENT and THERAPY PLAN:   Metastatic carcinoma (HCC) This is a very pleasant 68 year old postmenopausal female patient with  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   Outpatient PET/CT showed 2 short segments of small bowel with mild wall thickening and increased radiotracer uptake within the left upper quadrant of abdominal and lower pelvis.  Findings may represent known small bowel metastasis or additional sites of disease   She underwent surgical resection, two small bowel masses, neg margins, once again path with metastatic breast cancer likely, triple neg. Foundation one showed CPS 60%, PIK3CA mutation, no other targets. We have discussed about considering immunotherapy maintenance for at least a year and periodic imaging.   She is now on maintenance immunotherapy, tolerates extremely well.  Metastatic breast cancer Metastatic breast cancer with favorable PET scan results. Undergoing immunotherapy , today is her last dose of immunotherapy. She tolerated it extremely well, minor fatigue and arthralgias. 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 She is agreeable to this plan. RTC in 3 months      All questions were answered. The patient knows to call the clinic with any problems, questions or concerns. We can certainly see the patient much sooner if necessary.  Total encounter time:30 minutes*in face-to-face visit time, chart review, lab review, care coordination, order entry, and documentation of the encounter time.  *Total Encounter Time as defined by the Centers for Medicare and Medicaid Services  includes, in addition to the face-to-face time of a patient visit (documented in the note above) non-face-to-face time: obtaining and reviewing outside history, ordering and reviewing medications, tests or procedures, care coordination (communications with other health care professionals or caregivers) and documentation in the medical record.

## 2023-06-21 NOTE — Assessment & Plan Note (Addendum)
 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   Outpatient PET/CT showed 2 short segments of small bowel with mild wall thickening and increased radiotracer uptake within the left upper quadrant of abdominal and lower pelvis.  Findings may represent known small bowel metastasis or additional sites of disease   She underwent surgical resection, two small bowel masses, neg margins, once again path with metastatic breast cancer likely, triple neg. Foundation one showed CPS 60%, PIK3CA mutation, no other targets. We have discussed about considering immunotherapy maintenance for at least a year and periodic imaging.   She is now on maintenance immunotherapy, tolerates extremely well.  Metastatic breast cancer Metastatic breast cancer with favorable PET scan results. Undergoing immunotherapy , today is her last dose of immunotherapy. She tolerated it extremely well, minor fatigue and arthralgias. 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 She is agreeable to this plan. RTC in 3 months

## 2023-06-21 NOTE — Telephone Encounter (Signed)
 Guardant reveal sent.

## 2023-06-22 ENCOUNTER — Telehealth: Payer: Self-pay | Admitting: Hematology and Oncology

## 2023-06-22 ENCOUNTER — Other Ambulatory Visit: Payer: Self-pay | Admitting: *Deleted

## 2023-06-22 MED ORDER — CIPROFLOXACIN HCL 500 MG PO TABS: 500.0000 mg | ORAL_TABLET | Freq: Two times a day (BID) | ORAL | 0 refills | Status: DC

## 2023-06-22 NOTE — Telephone Encounter (Signed)
 Spoke with patient confirming upcoming appointment

## 2023-06-23 ENCOUNTER — Other Ambulatory Visit: Payer: Self-pay

## 2023-07-01 ENCOUNTER — Telehealth: Payer: Self-pay

## 2023-07-01 ENCOUNTER — Other Ambulatory Visit: Payer: Self-pay | Admitting: Hematology and Oncology

## 2023-07-01 DIAGNOSIS — C799 Secondary malignant neoplasm of unspecified site: Secondary | ICD-10-CM

## 2023-07-01 NOTE — Progress Notes (Signed)
 Called pt to discuss guardant results. She would like to continue immunotherapy maintenance since ct DNA is positive. Will arrange for her next immunotherapy once she returns from [place].  [name]

## 2023-07-01 NOTE — Telephone Encounter (Signed)
 Pt called and reports she s/w MD today and was given the option to retest her "blood test".   Message forwarded to MD for further review if pt is able to redraw Guardant.

## 2023-07-04 ENCOUNTER — Other Ambulatory Visit: Payer: Self-pay | Admitting: Hematology and Oncology

## 2023-07-05 ENCOUNTER — Encounter: Payer: Self-pay | Admitting: Hematology and Oncology

## 2023-07-05 ENCOUNTER — Telehealth: Payer: Self-pay | Admitting: Hematology and Oncology

## 2023-07-05 ENCOUNTER — Other Ambulatory Visit: Payer: Self-pay | Admitting: *Deleted

## 2023-07-05 NOTE — Telephone Encounter (Signed)
 Left patient a vm regarding upcoming appointment

## 2023-07-07 LAB — GUARDANT REVEAL

## 2023-07-12 ENCOUNTER — Encounter: Payer: Self-pay | Admitting: Hematology and Oncology

## 2023-07-12 NOTE — Telephone Encounter (Signed)
 Message sent in staff message to [facility] [facility] [place]. Terrel Ferries, RN

## 2023-07-27 ENCOUNTER — Encounter: Payer: Self-pay | Admitting: Hematology and Oncology

## 2023-08-23 ENCOUNTER — Other Ambulatory Visit

## 2023-08-23 ENCOUNTER — Ambulatory Visit: Admitting: Physician Assistant

## 2023-08-23 ENCOUNTER — Ambulatory Visit

## 2023-08-24 ENCOUNTER — Encounter (HOSPITAL_COMMUNITY)
Admission: RE | Admit: 2023-08-24 | Discharge: 2023-08-24 | Disposition: A | Discharge status: Home / Self Care | Source: Ambulatory Visit | Attending: Hematology and Oncology | Admitting: Hematology and Oncology

## 2023-08-24 DIAGNOSIS — C799 Secondary malignant neoplasm of unspecified site: Secondary | ICD-10-CM | POA: Diagnosis present

## 2023-08-24 LAB — GLUCOSE, CAPILLARY: Glucose-Capillary: 104 mg/dL — ABNORMAL HIGH (ref 70–99)

## 2023-08-24 MED ORDER — FLUDEOXYGLUCOSE F - 18 (FDG) INJECTION
7.9000 | Freq: Once | INTRAVENOUS | Status: AC
  Administered 2023-08-24: 7.9 via INTRAVENOUS

## 2023-08-25 ENCOUNTER — Inpatient Hospital Stay

## 2023-08-25 ENCOUNTER — Inpatient Hospital Stay: Admitting: Adult Health

## 2023-08-25 ENCOUNTER — Encounter: Payer: Self-pay | Admitting: Hematology and Oncology

## 2023-08-25 ENCOUNTER — Inpatient Hospital Stay: Attending: Hematology and Oncology

## 2023-08-25 ENCOUNTER — Encounter: Payer: Self-pay | Admitting: Adult Health

## 2023-08-25 VITALS — BP 114/90 | HR 76 | Temp 98.7°F | Resp 17 | Wt 157.9 lb

## 2023-08-25 DIAGNOSIS — C801 Malignant (primary) neoplasm, unspecified: Secondary | ICD-10-CM | POA: Diagnosis present

## 2023-08-25 DIAGNOSIS — C799 Secondary malignant neoplasm of unspecified site: Secondary | ICD-10-CM | POA: Insufficient documentation

## 2023-08-25 DIAGNOSIS — Z5112 Encounter for antineoplastic immunotherapy: Secondary | ICD-10-CM | POA: Diagnosis present

## 2023-08-25 DIAGNOSIS — Z95828 Presence of other vascular implants and grafts: Secondary | ICD-10-CM

## 2023-08-25 LAB — CBC WITH DIFFERENTIAL (CANCER CENTER ONLY)
Abs Immature Granulocytes: 0 10*3/uL (ref 0.00–0.07)
Basophils Absolute: 0 10*3/uL (ref 0.0–0.1)
Basophils Relative: 1 %
Eosinophils Absolute: 0.1 10*3/uL (ref 0.0–0.5)
Eosinophils Relative: 2 %
HCT: 35.9 % — ABNORMAL LOW (ref 36.0–46.0)
Hemoglobin: 12.2 g/dL (ref 12.0–15.0)
Immature Granulocytes: 0 %
Lymphocytes Relative: 29 %
Lymphs Abs: 1.4 10*3/uL (ref 0.7–4.0)
MCH: 33.2 pg (ref 26.0–34.0)
MCHC: 34 g/dL (ref 30.0–36.0)
MCV: 97.6 fL (ref 80.0–100.0)
Monocytes Absolute: 0.6 10*3/uL (ref 0.1–1.0)
Monocytes Relative: 12 %
Neutro Abs: 2.7 10*3/uL (ref 1.7–7.7)
Neutrophils Relative %: 56 %
Platelet Count: 194 10*3/uL (ref 150–400)
RBC: 3.68 MIL/uL — ABNORMAL LOW (ref 3.87–5.11)
RDW: 12.1 % (ref 11.5–15.5)
WBC Count: 4.8 10*3/uL (ref 4.0–10.5)
nRBC: 0 % (ref 0.0–0.2)

## 2023-08-25 LAB — CMP (CANCER CENTER ONLY)
ALT: 17 U/L (ref 0–44)
AST: 21 U/L (ref 15–41)
Albumin: 4.3 g/dL (ref 3.5–5.0)
Alkaline Phosphatase: 70 U/L (ref 38–126)
Anion gap: 5 (ref 5–15)
BUN: 18 mg/dL (ref 8–23)
CO2: 29 mmol/L (ref 22–32)
Calcium: 9.2 mg/dL (ref 8.9–10.3)
Chloride: 107 mmol/L (ref 98–111)
Creatinine: 0.77 mg/dL (ref 0.44–1.00)
GFR, Estimated: >60 mL/min (ref >60)
Glucose, Bld: 114 mg/dL — ABNORMAL HIGH (ref 70–99)
Potassium: 4.3 mmol/L (ref 3.5–5.1)
Sodium: 141 mmol/L (ref 135–145)
Total Bilirubin: 0.5 mg/dL (ref 0.0–1.2)
Total Protein: 6.6 g/dL (ref 6.5–8.1)

## 2023-08-25 MED ORDER — SODIUM CHLORIDE 0.9% FLUSH
10.0000 mL | INTRAVENOUS | Status: DC | PRN
  Administered 2023-08-25: 10 mL

## 2023-08-25 MED ORDER — SODIUM CHLORIDE 0.9 % IV SOLN
Freq: Once | INTRAVENOUS | Status: AC
Start: 2023-08-25 — End: 2023-08-25

## 2023-08-25 MED ORDER — HEPARIN SOD (PORK) LOCK FLUSH 100 UNIT/ML IV SOLN
500.0000 [IU] | Freq: Once | INTRAVENOUS | Status: AC | PRN
  Administered 2023-08-25: 500 [IU]

## 2023-08-25 MED ORDER — SODIUM CHLORIDE 0.9 % IV SOLN
400.0000 mg | Freq: Once | INTRAVENOUS | Status: AC
  Administered 2023-08-25: 400 mg via INTRAVENOUS
  Filled 2023-08-25: qty 16

## 2023-08-25 MED ORDER — SODIUM CHLORIDE 0.9% FLUSH
10.0000 mL | Freq: Once | INTRAVENOUS | Status: AC
  Administered 2023-08-25: 10 mL

## 2023-08-25 NOTE — Progress Notes (Signed)
 [facility] Cancer Follow up:    System, Provider Not In No address on file   DIAGNOSIS:  Cancer Staging  No matching staging information was found for the patient.    SUMMARY OF ONCOLOGIC HISTORY: Oncology History  Metastatic carcinoma (HCC)  10/2021 Genetic Testing   Negative. 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  Genes tested: AIP, ALK, APC, ATM, AXIN2, BAP1, BARD1, BLM, BMPR1A, BRCA1, BRCA2, BRIP1, CDC73, CDH1, CDK4, CDKN1B, CDKN2A, CHEK2, CTNNA1, DICER1, FANCC, FH, FLCN, GALNT12, KIF1B, LZTR1, MAX, MEN1, MET, MLH1, MSH2, MSH3, MSH6, MUTYH, NBN, NF1, NF2, NTHL1, PALB2, PHOX2B, PMS2, POT1, PRKAR1A, PTCH1, PTEN, RAD51C, RAD51D, RB1, RECQL, RET, SDHA, SDHAF2, SDHB, SDHC, SDHD, SMAD4, SMARCA4, SMARCB1, SMARCE1, STK11, SUFU, TMEM127, TP53, TSC1, TSC2, VHL and XRCC2 (sequencing and deletion/duplication); EGFR, EGLN1, HOXB13, KIT, MITF, PDGFRA, POLD1, and POLE (sequencing only); EPCAM and GREM1 (deletion/duplication only).    04/08/2022 Initial Biopsy   [facility] Enteroscopy and small bowel mass biopsy: The enteroscopy showed normal esophagus, stomach and examined duodenum.  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  Histology showed sheets to partially nested neoplastic cells with expression of CAM5.2 and GATA3, negative for CDX2, could represent metastatic breast cancer.  ER negative, PR negative, HER2 2+, FISH negative.    04/14/2022 Initial Biopsy   Omental biopsy: benign omental tissue   05/02/2022 PET scan   IMPRESSION: 1. There are 2 short segments of small bowel with mild wall thickening and increased radiotracer uptake within the left upper quadrant of the abdomen and lower pelvis. Findings may represent known small-bowel metastasis and/or additional sites of disease. 2. No tracer avid nodal metastasis or solid organ metastasis. 3.  Aortic Atherosclerosis  (ICD10-I70.0).     Electronically Signed   By: Waddell Calk M.D.   On: 05/02/2022 09:49   05/17/2022 Surgery   Small bowel resection: Poorly differentiated carcinoma, 3.7cm; margins negative and 5.3cm margins negative, 1LN negative for cancer, likely breast primary   05/17/2022 Pathology Results   Foundation 1: PD-L1 CPS 60, PI3KCA mutation   06/22/2022 -  Chemotherapy   Patient is on Treatment Plan : BREAST Pembrolizumab  (200) q21d x 24 months       CURRENT THERAPY: Keytruda   INTERVAL HISTORY:  Brigett is doing well today.  She continues on Keytruda  given every 3 weeks with good tolerance. She denies any significant issues other than bloating.  She underwent PET scan yesterday and the results are not yet available.     Patient Active Problem List   Diagnosis Date Noted   Port-A-Cath in place 07/13/2022   Metastatic carcinoma (HCC) 06/06/2022   Small bowel mass 05/17/2022   SBO (small bowel obstruction) (HCC) 03/31/2022   Genetic testing 11/10/2021   Family history of breast cancer 10/27/2021   Family history of colon cancer 10/27/2021   S/P breast reconstruction, bilateral 09/30/2019   S/P mastectomy, bilateral 04/13/2019   Weight gain 09/09/2013   Encounter for counseling 11/25/2012   Screening for malignant neoplasm of cervix 11/25/2012   Menopause 10/08/2012   Vitamin D  deficiency 10/08/2012   Hyperlipemia 10/08/2012   Osteoarthritis 06/04/2012    has no known allergies.  MEDICAL HISTORY: Past Medical History:  Diagnosis Date   Arthritis    Backache, unspecified    Cancer (HCC)    breast s/p BL mastectomies   Colon polyps    Family history of breast cancer 10/27/2021   Family history of colon cancer 10/27/2021  Hyperlipidemia    Mitral valve prolapse of mother during pregnancy    PONV (postoperative nausea and vomiting)     SURGICAL HISTORY: Past Surgical History:  Procedure Laterality Date   BOWEL RESECTION N/A 05/17/2022   Procedure: SMALL BOWEL  RESECTION;  Surgeon: Lyndel Deward PARAS, MD;  Location: [facility];  Service: General;  Laterality: N/A;   BREAST RECONSTRUCTION WITH PLACEMENT OF TISSUE EXPANDER AND FLEX HD (ACELLULAR HYDRATED DERMIS) Bilateral 04/05/2019   Procedure: BILATERAL BREAST RECONSTRUCTION WITH PLACEMENT OF TISSUE EXPANDER AND FLEX HD (ACELLULAR HYDRATED DERMIS);  Surgeon: Lowery Estefana RAMAN, DO;  Location: [facility];  Service: Plastics;  Laterality: Bilateral;   ENDOMETRIAL ABLATION  1998   GIVENS CAPSULE STUDY N/A 12/04/2021   Procedure: GIVENS CAPSULE STUDY;  Surgeon: Kristie Lamprey, MD;  Location: [facility];  Service: Gastroenterology;  Laterality: N/A;   IR IMAGING GUIDED PORT INSERTION  07/12/2022   KNEE SURGERY Right 2007   LAPAROSCOPY N/A 04/14/2022   Procedure: LAPAROSCOPY DIAGNOSTIC, G-TUBE PLACEMENT; OMENTUM BIOPSY; LYSIS OF ADHESIONS;  Surgeon: Lyndel Deward PARAS, MD;  Location: [facility];  Service: General;  Laterality: N/A;   LIPOSUCTION WITH LIPOFILLING Bilateral 01/30/2020   Procedure: LIPOSUCTION WITH LIPOFILLING;  Surgeon: Lowery Estefana RAMAN, DO;  Location: [facility];  Service: Plastics;  Laterality: Bilateral;  90 min, please   MASTECTOMY W/ SENTINEL NODE BIOPSY Bilateral 04/05/2019   Procedure: BILATERAL MASTECTOMIES WITH RIGHT SENTINEL LYMPH NODE BIOPSY;  Surgeon: Curvin Deward MOULD, MD;  Location: [facility];  Service: General;  Laterality: Bilateral;   REMOVAL OF BILATERAL TISSUE EXPANDERS WITH PLACEMENT OF BILATERAL BREAST IMPLANTS Bilateral 09/26/2019   Procedure: REMOVAL OF BILATERAL TISSUE EXPANDERS WITH PLACEMENT OF BILATERAL BREAST IMPLANTS;  Surgeon: Lowery Estefana RAMAN, DO;  Location: [facility];  Service: Plastics;  Laterality: Bilateral;   RHINOPLASTY  1977   TONSILLECTOMY  1961    SOCIAL HISTORY: Social History   Socioeconomic History   Marital status: Married    Spouse name: Actor   Number of children: 3   Years of education:  16+   Highest education level: Not on file  Occupational History    Employer: [employer]    Occupation: Herbalist  Tobacco Use   Smoking status: Never   Smokeless tobacco: Never  Vaping Use   Vaping status: Never Used  Substance and Sexual Activity   Alcohol use: Not Currently    Comment: 2-3/week   Drug use: No   Sexual activity: Yes    Partners: Male  Other Topics Concern   Not on file  Social History Narrative   Marital Status:  Married Midwife)    Children:  G3 P3 Daughters (Lauren, Ronal Legacy, Gorst)    Pets:  Poodle (Mollie); Cats Jenelle, Ronnald)    Living Situation: Lives with spouse.  Her daughters live in [place].    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[employer])    Education:  Best boy)    Tobacco Use/Exposure:  None    Alcohol Use:  Occasional   Drug Use:  None   Diet:  Regular   Exercise:  Walking   Hobbies:  Social research officer, government, Film/video editor              Social Drivers of Health   Financial Resource Strain: Low Risk  (06/20/2023)   Received from [facility]   Overall Financial Resource Strain (CARDIA)    Difficulty of Paying Living  Expenses: Not hard at all  Food Insecurity: No Food Insecurity (06/20/2023)   Received from [facility]   Hunger Vital Sign    Within the past 12 months, you worried that your food would run out before you got the money to buy more.: Never true    Within the past 12 months, the food you bought just didn't last and you didn't have money to get more.: Never true  Transportation Needs: No Transportation Needs (06/20/2023)   Received from [facility] - Transportation    Lack of Transportation (Medical): No    Lack of Transportation (Non-Medical): No  Physical Activity: Sufficiently Active (06/20/2023)   Received from [facility]   Exercise Vital Sign    On average, how many days per week do you engage in moderate to strenuous exercise  (like a brisk walk)?: 4 days    On average, how many minutes do you engage in exercise at this level?: 150+ min  Recent Concern: Physical Activity - Insufficiently Active (05/23/2023)   Received from [facility]   Exercise Vital Sign    Days of Exercise per Week: 1 day    Minutes of Exercise per Session: 60 min  Stress: No Stress Concern Present (06/20/2023)   Received from [facility] - Occupational Stress Questionnaire    Feeling of Stress : Not at all  Social Connections: Socially Integrated (06/20/2023)   Received from [facility]   Social Network    How would you rate your social network (family, work, friends)?: Good participation with social networks  Intimate Partner Violence: Not At Risk (06/20/2023)   Received from [facility]   HITS    Over the last 12 months how often did your partner physically hurt you?: Never    Over the last 12 months how often did your partner insult you or talk down to you?: Never    Over the last 12 months how often did your partner threaten you with physical harm?: Never    Over the last 12 months how often did your partner scream or curse at you?: Never    FAMILY HISTORY: Family History  Problem Relation Age of Onset   Arthritis Mother    Colon cancer Mother 41   Melanoma Father        dx 48s; nose   Lung cancer Paternal Uncle        dx after 5; smoking hx   Breast cancer Maternal Grandmother        dx 11s   Hyperlipidemia Paternal Grandmother    Cancer Paternal Grandfather        unknown type; d. before 34   Colon cancer Other        MGM's mother    Review of Systems  Constitutional:  Negative for appetite change, chills, fatigue, fever and unexpected weight change.  HENT:   Negative for hearing loss, lump/mass and trouble swallowing.   Eyes:  Negative for eye problems and icterus.  Respiratory:  Negative for chest tightness, cough and shortness of breath.   Cardiovascular:  Negative  for chest pain, leg swelling and palpitations.  Gastrointestinal:  Negative for abdominal distention, abdominal pain, blood in stool, constipation, diarrhea, nausea, rectal pain and vomiting.  Endocrine: Negative for hot flashes.  Genitourinary:  Negative for difficulty urinating.   Musculoskeletal:  Negative for arthralgias.  Skin:  Negative for itching and rash.  Neurological:  Negative for dizziness, extremity weakness, headaches  and numbness.  Hematological:  Negative for adenopathy. Does not bruise/bleed easily.  Psychiatric/Behavioral:  Negative for depression. The patient is not nervous/anxious.       PHYSICAL EXAMINATION    Vitals:   08/25/23 0958  BP: (!) 114/90  Pulse: 76  Resp: 17  Temp: 98.7 F (37.1 C)  SpO2: 99%    Physical Exam Constitutional:      General: She is not in acute distress.    Appearance: Normal appearance. She is not toxic-appearing.  HENT:     Head: Normocephalic and atraumatic.     Mouth/Throat:     Mouth: Mucous membranes are moist.     Pharynx: Oropharynx is clear. No oropharyngeal exudate or posterior oropharyngeal erythema.   Eyes:     General: No scleral icterus.   Cardiovascular:     Rate and Rhythm: Normal rate and regular rhythm.     Pulses: Normal pulses.     Heart sounds: Normal heart sounds.  Pulmonary:     Effort: Pulmonary effort is normal.     Breath sounds: Normal breath sounds.  Abdominal:     General: Abdomen is flat. Bowel sounds are normal. There is no distension.     Palpations: Abdomen is soft.     Tenderness: There is no abdominal tenderness.   Musculoskeletal:        General: No swelling.     Cervical back: Neck supple.  Lymphadenopathy:     Cervical: No cervical adenopathy.   Skin:    General: Skin is warm and dry.     Findings: No rash.   Neurological:     General: No focal deficit present.     Mental Status: She is alert.   Psychiatric:        Mood and Affect: Mood normal.        Behavior:  Behavior normal.     LABORATORY DATA:  CBC    Component Value Date/Time   WBC 4.8 08/25/2023 0913   WBC 11.5 (H) 05/18/2022 0510   RBC 3.68 (L) 08/25/2023 0913   HGB 12.2 08/25/2023 0913   HCT 35.9 (L) 08/25/2023 0913   PLT 194 08/25/2023 0913   MCV 97.6 08/25/2023 0913   MCH 33.2 08/25/2023 0913   MCHC 34.0 08/25/2023 0913   RDW 12.1 08/25/2023 0913   LYMPHSABS 1.4 08/25/2023 0913   MONOABS 0.6 08/25/2023 0913   EOSABS 0.1 08/25/2023 0913   BASOSABS 0.0 08/25/2023 0913    CMP     Component Value Date/Time   NA 141 08/25/2023 0913   K 4.3 08/25/2023 0913   CL 107 08/25/2023 0913   CO2 29 08/25/2023 0913   GLUCOSE 114 (H) 08/25/2023 0913   BUN 18 08/25/2023 0913   CREATININE 0.77 08/25/2023 0913   CREATININE 0.66 08/28/2012 1400   CALCIUM  9.2 08/25/2023 0913   PROT 6.6 08/25/2023 0913   ALBUMIN 4.3 08/25/2023 0913   AST 21 08/25/2023 0913   ALT 17 08/25/2023 0913   ALKPHOS 70 08/25/2023 0913   BILITOT 0.5 08/25/2023 0913   GFRNONAA >60 08/25/2023 0913   GFRNONAA >89 08/28/2012 1400   GFRAA >60 01/17/2019 1301   GFRAA >89 08/28/2012 1400     ASSESSMENT and THERAPY PLAN:   Metastatic carcinoma (HCC) [name] is a 68 year old with metastatic carcinoma undergoing treatment with Keytruda .  Labs today are within parameters for treatment.  PET scan pending read from radiology. Guardant reveal testing detected <0.05% circulating tumor DNA in 05/2023.  -  Proceed with Keytruda  today - Await PET results and discuss next steps after they result and with consultation with Dr. Loretha - Continue healthy diet and exercise  RTC based on the above.    All questions were answered. The patient knows to call the clinic with any problems, questions or concerns. We can certainly see the patient much sooner if necessary.  Total encounter time:20 minutes*in face-to-face visit time, chart review, lab review, care coordination, order entry, and documentation of the encounter  time.    Morna Kendall, NP 08/25/23 12:50 PM Medical Oncology and Hematology [facility] [address] [place], [place] [address] Tel. [phone]    Fax. [phone]  *Total Encounter Time as defined by the Centers for Medicare and Medicaid Services includes, in addition to the face-to-face time of a patient visit (documented in the note above) non-face-to-face time: obtaining and reviewing outside history, ordering and reviewing medications, tests or procedures, care coordination (communications with other health care professionals or caregivers) and documentation in the medical record.

## 2023-08-25 NOTE — Assessment & Plan Note (Signed)
 [name] is a 68 year old with metastatic carcinoma undergoing treatment with Keytruda .  Labs today are within parameters for treatment.  PET scan pending read from radiology. Guardant reveal testing detected <0.05% circulating tumor DNA in 05/2023.  - Proceed with Keytruda  today - Await PET results and discuss next steps after they result and with consultation with Dr. Loretha - Continue healthy diet and exercise  RTC based on the above.

## 2023-08-25 NOTE — Patient Instructions (Signed)

## 2023-08-26 ENCOUNTER — Other Ambulatory Visit: Payer: Self-pay | Admitting: Hematology and Oncology

## 2023-08-26 DIAGNOSIS — C799 Secondary malignant neoplasm of unspecified site: Secondary | ICD-10-CM

## 2023-08-26 NOTE — Progress Notes (Signed)
 Discussed PET results. Solitary right internal mammary LN noted on the most recent PET scan. We discussed about considering radiation given solitary area of concern. She is agreeable. Referral placed to rad onc.  Josslynn Mentzer

## 2023-09-10 ENCOUNTER — Other Ambulatory Visit: Payer: Self-pay

## 2023-09-12 NOTE — Progress Notes (Signed)
 Histology and Location of Primary Cancer:  Right Breast DCIS with microinvasion noted  Location(s) of  tumor(s):  Enteroscopy and Small Bowel Mass Biopsy: Pathology showed poorly differentiated carcinoma   Patient's main complaints related to symptomatic tumor(s) are: *** Difficulty having bowel movements Small Bowel Resection on 05/17/2022  Past/Anticipated chemotherapy by medical oncology, if any:  Immunotherapy Maintenance  Pembrolizumab   Q 21 days X 24 Months  Radiation Therapy with Dr. Izell  Patient's main complaints related to symptomatic tumor(s) are: ***  Pain on a scale of 0-10 is:     If Spine Met(s), symptoms, if any, include: Bowel/Bladder retention or incontinence (please describe): *** Numbness or weakness in extremities (please describe): *** Current Decadron  regimen, if applicable: ***  Ambulatory status? Walker? Wheelchair?: ***  SAFETY ISSUES: Prior radiation? *** Pacemaker/ICD? *** Possible current pregnancy? *** Is the patient on methotrexate? ***  Additional Complaints / other details:  ***

## 2023-09-12 NOTE — Progress Notes (Signed)
 Radiation Oncology         [phone]) [phone] ________________________________  Initial outpatient Consultation  Name: [name] MRN: [id number]  Date: 09/13/2023  DOB: 10-15-1955  RR:Dbduzf, Provider Not In  Caroline Ash, MD   REFERRING PHYSICIAN: Loretha Ash, MD  DIAGNOSIS: No diagnosis found.  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   CHIEF COMPLAINT: Here to discuss management of metastatic cancer in small bowels  HISTORY OF PRESENT ILLNESS::[name] is a 68 y.o. female with a history of right breast DCIS diagnosed in 2020. She was last seen in office on 01/17/2019 at the breast clinic.   Of note: patient experienced an episode of melena in November 2022, she then followed up with Dr. Kristie from gastroenterology and had a colonoscopy in 12/09/21 which was unremarkable. Dr. Kristie recommended further evaluation with imaging which she underwent a CT abdomen showing no definitive abnormality.   She presented to her PCP in January of 2024 complaining of frequent abdominal pain described as spasms. She was referred to [facility] on 04/07/22. To further investigate her symptoms, she underwent a enteroscopy on 04/08/22 which showing discrete focal segment of abnormal jejunum with findings concerning for lymphoma or for other infiltrative malignancy.    Subsequently, she then had small bowel mass biopsy. 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  immunohistochemistry histology showed sheets to partially nested neoplastic cells with expression of CAM5.2 and GATA3.  diagnosis was believed to be breast cancer primary.   She then underwent surgical resection on 05/17/22 of  two small bowel masses. Surgical pathology showed negative margins. Foundation one showed CPS 60%, PIK3CA mutation, no other targets. Following her procedure, patient was placed on  Pembrolizumab  initiated on 06/22/22.  Patient continues to be followed by Dr. Loretha. She is currently on Keytruda  given every 3 weeks. Guardant reveal testing detected <0.05% circulating tumor DNA in 05/2023.     Most recent PET preformed on 08/24/23 showed persistent metabolic activity associated with a small right internal mammary lymph node measuring 5 mm compared to 3 mm previously.   PREVIOUS RADIATION THERAPY: No  PAST MEDICAL HISTORY:  has a past medical history of Arthritis, Backache, unspecified, Cancer (HCC), Colon polyps, Family history of breast cancer (10/27/2021), Family history of colon cancer (10/27/2021), Hyperlipidemia, Mitral valve prolapse of mother during pregnancy, and PONV (postoperative nausea and vomiting).    PAST SURGICAL HISTORY: Past Surgical History:  Procedure Laterality Date   BOWEL RESECTION N/A 05/17/2022   Procedure: SMALL BOWEL RESECTION;  Surgeon: Caroline Deward PARAS, MD;  Location: [facility];  Service: General;  Laterality: N/A;   BREAST RECONSTRUCTION WITH PLACEMENT OF TISSUE EXPANDER AND FLEX HD (ACELLULAR HYDRATED DERMIS) Bilateral 04/05/2019   Procedure: BILATERAL BREAST RECONSTRUCTION WITH PLACEMENT OF TISSUE EXPANDER AND FLEX HD (ACELLULAR HYDRATED DERMIS);  Surgeon: Caroline Estefana RAMAN, DO;  Location: [facility];  Service: Plastics;  Laterality: Bilateral;   ENDOMETRIAL ABLATION  1998   GIVENS CAPSULE STUDY N/A 12/04/2021   Procedure: GIVENS CAPSULE STUDY;  Surgeon: Caroline Lamprey, MD;  Location: [facility];  Service: Gastroenterology;  Laterality: N/A;   IR IMAGING GUIDED PORT INSERTION  07/12/2022   KNEE SURGERY Right 2007   LAPAROSCOPY N/A 04/14/2022   Procedure: LAPAROSCOPY DIAGNOSTIC, G-TUBE PLACEMENT; OMENTUM BIOPSY; LYSIS OF ADHESIONS;  Surgeon: Caroline Deward PARAS, MD;  Location: [facility];  Service: General;  Laterality: N/A;  LIPOSUCTION WITH LIPOFILLING Bilateral 01/30/2020   Procedure: LIPOSUCTION WITH LIPOFILLING;  Surgeon:  Caroline Estefana RAMAN, DO;  Location: [facility];  Service: Plastics;  Laterality: Bilateral;  90 min, please   MASTECTOMY W/ SENTINEL NODE BIOPSY Bilateral 04/05/2019   Procedure: BILATERAL MASTECTOMIES WITH RIGHT SENTINEL LYMPH NODE BIOPSY;  Surgeon: Caroline Deward MOULD, MD;  Location: [facility];  Service: General;  Laterality: Bilateral;   REMOVAL OF BILATERAL TISSUE EXPANDERS WITH PLACEMENT OF BILATERAL BREAST IMPLANTS Bilateral 09/26/2019   Procedure: REMOVAL OF BILATERAL TISSUE EXPANDERS WITH PLACEMENT OF BILATERAL BREAST IMPLANTS;  Surgeon: Caroline Estefana RAMAN, DO;  Location: [facility];  Service: Plastics;  Laterality: Bilateral;   RHINOPLASTY  1977   TONSILLECTOMY  1961    FAMILY HISTORY: family history includes Arthritis in her mother; Breast cancer in her maternal grandmother; Cancer in her paternal grandfather; Colon cancer in an other family member; Colon cancer (age of onset: 9) in her mother; Hyperlipidemia in her paternal grandmother; Lung cancer in her paternal uncle; Melanoma in her father.  SOCIAL HISTORY:  reports that she has never smoked. She has never used smokeless tobacco. She reports that she does not currently use alcohol. She reports that she does not use drugs.  ALLERGIES: Patient has no known allergies.  MEDICATIONS:  Current Outpatient Medications  Medication Sig Dispense Refill   acetaminophen  (TYLENOL ) 500 MG tablet Take 2 tablets (1,000 mg total) by mouth every 6 (six) hours as needed for moderate pain. 30 tablet 0   bisacodyl  (DULCOLAX) 5 MG EC tablet Take 1 tablet (5 mg total) by mouth daily as needed for moderate constipation. 30 tablet 0   calcium  carbonate (OS-CAL - DOSED IN MG OF ELEMENTAL CALCIUM ) 1250 (500 Ca) MG tablet Take 1 tablet by mouth 2 (two) times daily with a meal.     Calcium  Carbonate Antacid (TUMS PO) Take 3-4 tablets by mouth as needed (heartburn).     cycloSPORINE  (RESTASIS ) 0.05 % ophthalmic  emulsion Place 1 drop into both eyes 2 (two) times daily.     diclofenac  (VOLTAREN ) 75 MG EC tablet Take 1 tablet (75 mg total) by mouth 2 (two) times daily. 15 tablet 0   Ensure Max Protein (ENSURE MAX PROTEIN) LIQD Take 330 mLs (11 oz total) by mouth 3 (three) times daily.     famotidine  (PEPCID ) 40 MG tablet Take 1 tablet (40 mg total) by mouth daily as needed for heartburn or indigestion. 30 tablet 1   lidocaine -prilocaine  (EMLA ) cream Apply to affected area once 30 g 3   Melatonin 10 MG TABS Take 10 mg by mouth at bedtime.     Multiple Vitamin (MULTI-VITAMIN DAILY PO) Take 1 tablet by mouth daily.     ondansetron  (ZOFRAN ) 8 MG tablet Take 1 tablet (8 mg total) by mouth every 8 (eight) hours as needed for nausea or vomiting. 30 tablet 1   prochlorperazine  (COMPAZINE ) 10 MG tablet Take 1 tablet (10 mg total) by mouth every 6 (six) hours as needed for nausea or vomiting. (Patient not taking: Reported on 05/27/2023) 30 tablet 1   rosuvastatin  (CRESTOR ) 10 MG tablet TAKE 1 TABLET(10 MG) BY MOUTH AT BEDTIME 90 tablet 0   No current facility-administered medications for this encounter.    REVIEW OF SYSTEMS:  Notable for that above.   PHYSICAL EXAM:  vitals were not taken for this visit.   General: Alert and oriented, in no acute distress *** HEENT: Head is normocephalic. Extraocular movements are intact.  Oropharynx is clear. Neck: Neck is supple, no palpable cervical or supraclavicular lymphadenopathy. Heart: Regular in rate and rhythm with no murmurs, rubs, or gallops. Chest: Clear to auscultation bilaterally, with no rhonchi, wheezes, or rales. Abdomen: Soft, nontender, nondistended, with no rigidity or guarding. Extremities: No cyanosis or edema. Lymphatics: see Neck Exam Skin: No concerning lesions. Musculoskeletal: symmetric strength and muscle tone throughout. Neurologic: Cranial nerves II through XII are grossly intact. No obvious focalities. Speech is fluent. Coordination is  intact. Psychiatric: Judgment and insight are intact. Affect is appropriate.   ECOG = ***  0 - Asymptomatic (Fully active, able to carry on all predisease activities without restriction)  1 - Symptomatic but completely ambulatory (Restricted in physically strenuous activity but ambulatory and able to carry out work of a light or sedentary nature. For example, light housework, office work)  2 - Symptomatic, <50% in bed during the day (Ambulatory and capable of all self care but unable to carry out any work activities. Up and about more than 50% of waking hours)  3 - Symptomatic, >50% in bed, but not bedbound (Capable of only limited self-care, confined to bed or chair 50% or more of waking hours)  4 - Bedbound (Completely disabled. Cannot carry on any self-care. Totally confined to bed or chair)  5 - Death   Raylene MM, Creech RH, Tormey DC, et al. [phone]). Toxicity and response criteria of the [facility]. Am. DOROTHA Bridges. Oncol. 5 (6): 649-55   LABORATORY DATA:  Lab Results  Component Value Date   WBC 4.8 08/25/2023   HGB 12.2 08/25/2023   HCT 35.9 (L) 08/25/2023   MCV 97.6 08/25/2023   PLT 194 08/25/2023   CMP     Component Value Date/Time   NA 141 08/25/2023 0913   K 4.3 08/25/2023 0913   CL 107 08/25/2023 0913   CO2 29 08/25/2023 0913   GLUCOSE 114 (H) 08/25/2023 0913   BUN 18 08/25/2023 0913   CREATININE 0.77 08/25/2023 0913   CREATININE 0.66 08/28/2012 1400   CALCIUM  9.2 08/25/2023 0913   PROT 6.6 08/25/2023 0913   ALBUMIN 4.3 08/25/2023 0913   AST 21 08/25/2023 0913   ALT 17 08/25/2023 0913   ALKPHOS 70 08/25/2023 0913   BILITOT 0.5 08/25/2023 0913   GFRNONAA >60 08/25/2023 0913   GFRNONAA >89 08/28/2012 1400         RADIOGRAPHY: NM PET Image Restage (PS) Skull Base to Thigh (F-18 FDG) Result Date: 08/25/2023 CLINICAL DATA:  Subsequent treatment strategy for breast carcinoma. EXAM: NUCLEAR MEDICINE PET SKULL BASE TO THIGH TECHNIQUE: 7.9  mCi F-18 FDG was injected intravenously. Full-ring PET imaging was performed from the skull base to thigh after the radiotracer. CT data was obtained and used for attenuation correction and anatomic localization. Fasting blood glucose: 104 mg/dl COMPARISON:  FDG PET scan 03/01/2023 FINDINGS: NECK: No hypermetabolic lymph nodes in the neck. Incidental CT findings: None. CHEST: Persistent metabolic activity associated with a small RIGHT internal mammary lymph node with SUV max equal 4.2 increased from SUV max equal 1.9 (image 78). Small node measures 5 mm short axis and increased in size from 3 mm. No hypermetabolic mediastinal lymph nodes. No hypermetabolic axillary lymph nodes. Bilateral breast prosthetics noted. Incidental CT findings: No suspicious pulmonary nodules. Port in the anterior chest wall with tip in distal SVC. ABDOMEN/PELVIS: Several low-density lesions in liver unchanged from comparison exam and do not have radiotracer activity. No hypermetabolic abdominopelvic lymph nodes. Incidental CT findings: Uterus  and adnexa unremarkable. SKELETON: No focal hypermetabolic activity to suggest skeletal metastasis. Incidental CT findings: None. IMPRESSION: 1. Persistent hypermetabolic activity associated with a small RIGHT internal mammary lymph node. Node is increased in size from comparison exam. Findings concerning for solitary breast cancer nodal metastasis. 2. No evidence of pulmonary metastasis. 3. No evidence of metastatic adenopathy in the abdomen pelvis. 4. No evidence of skeletal metastasis. Electronically Signed   By: Jackquline Boxer M.D.   On: 08/25/2023 11:08      IMPRESSION/PLAN:***    On date of service, in total, I spent *** minutes on this encounter. Patient was seen in person.   __________________________________________   Lauraine Golden, MD  This document serves as a record of services personally performed by Lauraine Golden, MD. It was created on her behalf by Reymundo Cartwright, a trained  medical scribe. The creation of this record is based on the scribe's personal observations and the provider's statements to them. This document has been checked and approved by the attending provider.

## 2023-09-13 ENCOUNTER — Ambulatory Visit
Admission: RE | Admit: 2023-09-13 | Discharge: 2023-09-13 | Disposition: A | Discharge status: Home / Self Care | Source: Ambulatory Visit | Attending: Radiation Oncology | Admitting: Radiation Oncology

## 2023-09-13 ENCOUNTER — Other Ambulatory Visit: Payer: Self-pay

## 2023-09-13 ENCOUNTER — Encounter: Payer: Self-pay | Admitting: Radiation Oncology

## 2023-09-13 VITALS — BP 138/93 | HR 68 | Temp 97.9°F | Resp 18 | Ht 67.0 in | Wt 155.6 lb

## 2023-09-13 DIAGNOSIS — C771 Secondary and unspecified malignant neoplasm of intrathoracic lymph nodes: Secondary | ICD-10-CM | POA: Insufficient documentation

## 2023-09-13 HISTORY — DX: Malignant neoplasm of unspecified site of unspecified female breast: C50.919

## 2023-09-16 ENCOUNTER — Ambulatory Visit
Admission: RE | Admit: 2023-09-16 | Discharge: 2023-09-16 | Disposition: A | Discharge status: Home / Self Care | Source: Ambulatory Visit | Attending: Radiation Oncology | Admitting: Radiation Oncology

## 2023-09-16 DIAGNOSIS — C50919 Malignant neoplasm of unspecified site of unspecified female breast: Secondary | ICD-10-CM | POA: Diagnosis present

## 2023-09-16 DIAGNOSIS — Z1732 Human epidermal growth factor receptor 2 negative status: Secondary | ICD-10-CM | POA: Diagnosis not present

## 2023-09-16 DIAGNOSIS — Z510 Encounter for antineoplastic radiation therapy: Secondary | ICD-10-CM | POA: Diagnosis present

## 2023-09-16 DIAGNOSIS — C771 Secondary and unspecified malignant neoplasm of intrathoracic lymph nodes: Secondary | ICD-10-CM | POA: Insufficient documentation

## 2023-09-16 DIAGNOSIS — Z1722 Progesterone receptor negative status: Secondary | ICD-10-CM | POA: Insufficient documentation

## 2023-09-16 DIAGNOSIS — Z171 Estrogen receptor negative status [ER-]: Secondary | ICD-10-CM | POA: Insufficient documentation

## 2023-09-20 ENCOUNTER — Telehealth: Payer: Self-pay

## 2023-09-20 DIAGNOSIS — Z510 Encounter for antineoplastic radiation therapy: Secondary | ICD-10-CM | POA: Diagnosis not present

## 2023-09-20 NOTE — Telephone Encounter (Signed)
Patient didn't answer the phone call.

## 2023-09-21 ENCOUNTER — Inpatient Hospital Stay: Attending: Hematology and Oncology | Admitting: Hematology and Oncology

## 2023-09-21 VITALS — BP 122/71 | HR 70 | Temp 98.5°F | Resp 19 | Wt 156.3 lb

## 2023-09-21 DIAGNOSIS — Z801 Family history of malignant neoplasm of trachea, bronchus and lung: Secondary | ICD-10-CM | POA: Insufficient documentation

## 2023-09-21 DIAGNOSIS — C801 Malignant (primary) neoplasm, unspecified: Secondary | ICD-10-CM | POA: Diagnosis present

## 2023-09-21 DIAGNOSIS — Z171 Estrogen receptor negative status [ER-]: Secondary | ICD-10-CM | POA: Diagnosis not present

## 2023-09-21 DIAGNOSIS — Z803 Family history of malignant neoplasm of breast: Secondary | ICD-10-CM | POA: Insufficient documentation

## 2023-09-21 DIAGNOSIS — C799 Secondary malignant neoplasm of unspecified site: Secondary | ICD-10-CM

## 2023-09-21 DIAGNOSIS — Z800 Family history of malignant neoplasm of digestive organs: Secondary | ICD-10-CM | POA: Diagnosis not present

## 2023-09-21 DIAGNOSIS — Z808 Family history of malignant neoplasm of other organs or systems: Secondary | ICD-10-CM | POA: Diagnosis not present

## 2023-09-21 DIAGNOSIS — C784 Secondary malignant neoplasm of small intestine: Secondary | ICD-10-CM | POA: Diagnosis present

## 2023-09-21 DIAGNOSIS — C50911 Malignant neoplasm of unspecified site of right female breast: Secondary | ICD-10-CM

## 2023-09-21 NOTE — Assessment & Plan Note (Signed)
 [name] is a 68 year old with 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  Labs today are within parameters for treatment.  PET scan pending read from radiology. Guardant reveal testing detected <0.05% circulating tumor DNA in 05/2023.  PET showed hypermetabolic IM LN, no other evidence of met disease Given single area of concern, discussed with Dr Izell, she will get radiation. We have also discussed continuing keytruda  indefinitely. We will also plan to repeat guardant reveal, anticipate repeat imaging in about 3/4 months

## 2023-09-21 NOTE — Progress Notes (Signed)
 [facility] Cancer Follow up:    System, Provider Not In No address on file   DIAGNOSIS:  Cancer Staging  No matching staging information was found for the patient.    SUMMARY OF ONCOLOGIC HISTORY: Oncology History  Metastatic carcinoma (HCC)  10/2021 Genetic Testing   Negative. 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  Genes tested: AIP, ALK, APC, ATM, AXIN2, BAP1, BARD1, BLM, BMPR1A, BRCA1, BRCA2, BRIP1, CDC73, CDH1, CDK4, CDKN1B, CDKN2A, CHEK2, CTNNA1, DICER1, FANCC, FH, FLCN, GALNT12, KIF1B, LZTR1, MAX, MEN1, MET, MLH1, MSH2, MSH3, MSH6, MUTYH, NBN, NF1, NF2, NTHL1, PALB2, PHOX2B, PMS2, POT1, PRKAR1A, PTCH1, PTEN, RAD51C, RAD51D, RB1, RECQL, RET, SDHA, SDHAF2, SDHB, SDHC, SDHD, SMAD4, SMARCA4, SMARCB1, SMARCE1, STK11, SUFU, TMEM127, TP53, TSC1, TSC2, VHL and XRCC2 (sequencing and deletion/duplication); EGFR, EGLN1, HOXB13, KIT, MITF, PDGFRA, POLD1, and POLE (sequencing only); EPCAM and GREM1 (deletion/duplication only).    04/08/2022 Initial Biopsy   [facility] Enteroscopy and small bowel mass biopsy: The enteroscopy showed normal esophagus, stomach and examined duodenum.  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  Histology showed sheets to partially nested neoplastic cells with expression of CAM5.2 and GATA3, negative for CDX2, could represent metastatic breast cancer.  ER negative, PR negative, HER2 2+, FISH negative.    04/14/2022 Initial Biopsy   Omental biopsy: benign omental tissue   05/02/2022 PET scan   IMPRESSION: 1. There are 2 short segments of small bowel with mild wall thickening and increased radiotracer uptake within the left upper quadrant of the abdomen and lower pelvis. Findings may represent known small-bowel metastasis and/or additional sites of disease. 2. No tracer avid nodal metastasis or solid organ metastasis. 3.  Aortic Atherosclerosis  (ICD10-I70.0).     Electronically Signed   By: Waddell Calk M.D.   On: 05/02/2022 09:49   05/17/2022 Surgery   Small bowel resection: Poorly differentiated carcinoma, 3.7cm; margins negative and 5.3cm margins negative, 1LN negative for cancer, likely breast primary   05/17/2022 Pathology Results   Foundation 1: PD-L1 CPS 60, PI3KCA mutation   06/22/2022 -  Chemotherapy   Patient is on Treatment Plan : BREAST Pembrolizumab  (200) q21d x 24 months       CURRENT THERAPY: Keytruda   INTERVAL HISTORY:  Discussed the use of AI scribe software for clinical note transcription with the patient, who gave verbal consent to proceed.  History of Present Illness Jaquisha Frech is a 68 year old female who presents for follow-up regarding her ongoing cancer treatment with immunotherapy. She will be receiving radiation to the internal mammary LN, will be following with Dr Izell.  She is currently undergoing treatment with Keytruda  (pembrolizumab ) and radiation therapy, consisting of ten fractions over three weeks, with immunotherapy continuing concurrently. She has previously undergone similar overlapping treatments without issues.  She feels well with no new symptoms or complaints.  Her treatment schedule includes immunotherapy every six weeks, with the next dose scheduled for August 5th. She has been on this regimen for some time and is comfortable continuing as long as it remains effective.   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) 09/13/2023   Port-A-Cath in place 07/13/2022   Metastatic carcinoma (HCC) 06/06/2022   Small bowel mass 05/17/2022   SBO (small bowel obstruction) (HCC) 03/31/2022   Genetic testing 11/10/2021   Family history of breast cancer 10/27/2021   Family history  of colon cancer 10/27/2021   S/P breast reconstruction, bilateral 09/30/2019   S/P mastectomy, bilateral 04/13/2019    Weight gain 09/09/2013   Encounter for counseling 11/25/2012   Screening for malignant neoplasm of cervix 11/25/2012   Menopause 10/08/2012   Vitamin D  deficiency 10/08/2012   Hyperlipemia 10/08/2012   Osteoarthritis 06/04/2012    has no known allergies.  MEDICAL HISTORY: Past Medical History:  Diagnosis Date   Arthritis    Backache, unspecified    Breast cancer (HCC)    Cancer (HCC)    breast s/p BL mastectomies   Colon polyps    Family history of breast cancer 10/27/2021   Family history of colon cancer 10/27/2021   Hyperlipidemia    Mitral valve prolapse of mother during pregnancy    PONV (postoperative nausea and vomiting)     SURGICAL HISTORY: Past Surgical History:  Procedure Laterality Date   BOWEL RESECTION N/A 05/17/2022   Procedure: SMALL BOWEL RESECTION;  Surgeon: Lyndel Deward PARAS, MD;  Location: [facility];  Service: General;  Laterality: N/A;   BREAST RECONSTRUCTION WITH PLACEMENT OF TISSUE EXPANDER AND FLEX HD (ACELLULAR HYDRATED DERMIS) Bilateral 04/05/2019   Procedure: BILATERAL BREAST RECONSTRUCTION WITH PLACEMENT OF TISSUE EXPANDER AND FLEX HD (ACELLULAR HYDRATED DERMIS);  Surgeon: Lowery Estefana RAMAN, DO;  Location: [facility];  Service: Plastics;  Laterality: Bilateral;   ENDOMETRIAL ABLATION  1998   GIVENS CAPSULE STUDY N/A 12/04/2021   Procedure: GIVENS CAPSULE STUDY;  Surgeon: Kristie Lamprey, MD;  Location: [facility];  Service: Gastroenterology;  Laterality: N/A;   IR IMAGING GUIDED PORT INSERTION  07/12/2022   KNEE SURGERY Right 2007   LAPAROSCOPY N/A 04/14/2022   Procedure: LAPAROSCOPY DIAGNOSTIC, G-TUBE PLACEMENT; OMENTUM BIOPSY; LYSIS OF ADHESIONS;  Surgeon: Lyndel Deward PARAS, MD;  Location: [facility];  Service: General;  Laterality: N/A;   LIPOSUCTION WITH LIPOFILLING Bilateral 01/30/2020   Procedure: LIPOSUCTION WITH LIPOFILLING;  Surgeon: Lowery Estefana RAMAN, DO;  Location: [facility];  Service: Plastics;  Laterality:  Bilateral;  90 min, please   MASTECTOMY W/ SENTINEL NODE BIOPSY Bilateral 04/05/2019   Procedure: BILATERAL MASTECTOMIES WITH RIGHT SENTINEL LYMPH NODE BIOPSY;  Surgeon: Curvin Deward MOULD, MD;  Location: [facility];  Service: General;  Laterality: Bilateral;   REMOVAL OF BILATERAL TISSUE EXPANDERS WITH PLACEMENT OF BILATERAL BREAST IMPLANTS Bilateral 09/26/2019   Procedure: REMOVAL OF BILATERAL TISSUE EXPANDERS WITH PLACEMENT OF BILATERAL BREAST IMPLANTS;  Surgeon: Lowery Estefana RAMAN, DO;  Location: [facility];  Service: Plastics;  Laterality: Bilateral;   RHINOPLASTY  1977   TONSILLECTOMY  1961    SOCIAL HISTORY: Social History   Socioeconomic History   Marital status: Married    Spouse name: Actor   Number of children: 3   Years of education: 16+   Highest education level: Not on file  Occupational History    Employer: [employer]    Occupation: Herbalist  Tobacco Use   Smoking status: Never   Smokeless tobacco: Never  Vaping Use   Vaping status: Never Used  Substance and Sexual Activity   Alcohol use: Not Currently    Comment: 2-3/week   Drug use: No   Sexual activity: Yes    Partners: Male  Other Topics Concern   Not on file  Social History Narrative   Marital Status:  Married Midwife)    Children:  G3 P3 Daughters (Lauren, Ronal Legacy, [place])    Pets:  Poodle (Mollie); Cats Jenelle, [place])  Living Situation: Lives with spouse.  Her daughters live in [place].    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[employer])    Education:  Best boy)    Tobacco Use/Exposure:  None    Alcohol Use:  Occasional   Drug Use:  None   Diet:  Regular   Exercise:  Walking   Hobbies:  Gardening, Film/video editor              Social Drivers of Corporate investment banker Strain: Low Risk  (06/20/2023)   Received from [facility]   Overall Financial Resource Strain (CARDIA)     Difficulty of Paying Living Expenses: Not hard at all  Food Insecurity: No Food Insecurity (06/20/2023)   [facility]   Hunger Vital Sign    Within the past 12 months, you worried that your food would run out before you got the money to buy more.: Never true    Within the past 12 months, the food you bought just didn't last and you didn't have money to get more.: Never true  Transportation Needs: No Transportation Needs (06/20/2023)   Received from [facility] - Transportation    Lack of Transportation (Medical): No    Lack of Transportation (Non-Medical): No  Physical Activity: Sufficiently Active (06/20/2023)   Received from [facility]   Exercise Vital Sign    On average, how many days per week do you engage in moderate to strenuous exercise (like a brisk walk)?: 4 days    On average, how many minutes do you engage in exercise at this level?: 150+ min  Recent Concern: Physical Activity - Insufficiently Active (05/23/2023)   Received from [facility]   Exercise Vital Sign    Days of Exercise per Week: 1 day    Minutes of Exercise per Session: 60 min  Stress: No Stress Concern Present (06/20/2023)   Received from [facility] Occupational [facility] - Occupational Stress Questionnaire    Feeling of Stress : Not at all  Social Connections: Socially Integrated (06/20/2023)   Received from [facility]   Social Network    How would you rate your social network (family, work, friends)?: Good participation with social networks  Intimate Partner Violence: Not At Risk (06/20/2023)   Received from [facility]   HITS    Over the last 12 months how often did your partner physically hurt you?: Never    Over the last 12 months how often did your partner insult you or talk down to you?: Never    Over the last 12 months how often did your partner threaten you with physical harm?: Never    Over the last 12 months how often did your partner scream  or curse at you?: Never    FAMILY HISTORY: Family History  Problem Relation Age of Onset   Arthritis Mother    Colon cancer Mother 9   Melanoma Father        dx 56s; nose   Breast cancer Sister    Lung cancer Paternal Uncle        dx after 68; smoking hx   Breast cancer Maternal Grandmother        dx 66s   Hyperlipidemia Paternal Grandmother    Cancer Paternal Grandfather        unknown type; d. before 18   Colon cancer Other  MGM's mother    Review of Systems  Constitutional:  Negative for appetite change, chills, fatigue, fever and unexpected weight change.  HENT:   Negative for hearing loss, lump/mass and trouble swallowing.   Eyes:  Negative for eye problems and icterus.  Respiratory:  Negative for chest tightness, cough and shortness of breath.   Cardiovascular:  Negative for chest pain, leg swelling and palpitations.  Gastrointestinal:  Negative for abdominal distention, abdominal pain, blood in stool, constipation, diarrhea, nausea, rectal pain and vomiting.  Endocrine: Negative for hot flashes.  Genitourinary:  Negative for difficulty urinating.   Musculoskeletal:  Negative for arthralgias.  Skin:  Negative for itching and rash.  Neurological:  Negative for dizziness, extremity weakness, headaches and numbness.  Hematological:  Negative for adenopathy. Does not bruise/bleed easily.  Psychiatric/Behavioral:  Negative for depression. The patient is not nervous/anxious.       PHYSICAL EXAMINATION   Onc Performance Status - 09/21/23 1200       KPS SCALE   KPS % SCORE Normal, no compliants, no evidence of disease          Vitals:   09/21/23 1200  BP: 122/71  Pulse: 70  Resp: 19  Temp: 98.5 F (36.9 C)  SpO2: 100%    Physical Exam Constitutional:      General: She is not in acute distress.    Appearance: Normal appearance. She is not toxic-appearing.  HENT:     Head: Normocephalic and atraumatic.     Mouth/Throat:     Mouth: Mucous  membranes are moist.     Pharynx: Oropharynx is clear. No oropharyngeal exudate or posterior oropharyngeal erythema.  Eyes:     General: No scleral icterus. Cardiovascular:     Rate and Rhythm: Normal rate and regular rhythm.     Pulses: Normal pulses.     Heart sounds: Normal heart sounds.  Pulmonary:     Effort: Pulmonary effort is normal.     Breath sounds: Normal breath sounds.  Abdominal:     General: Abdomen is flat. Bowel sounds are normal. There is no distension.     Palpations: Abdomen is soft.     Tenderness: There is no abdominal tenderness.  Musculoskeletal:        General: No swelling.     Cervical back: Neck supple.  Lymphadenopathy:     Cervical: No cervical adenopathy.  Skin:    General: Skin is warm and dry.     Findings: No rash.  Neurological:     General: No focal deficit present.     Mental Status: She is alert.  Psychiatric:        Mood and Affect: Mood normal.        Behavior: Behavior normal.     LABORATORY DATA:  CBC    Component Value Date/Time   WBC 4.8 08/25/2023 0913   WBC 11.5 (H) 05/18/2022 0510   RBC 3.68 (L) 08/25/2023 0913   HGB 12.2 08/25/2023 0913   HCT 35.9 (L) 08/25/2023 0913   PLT 194 08/25/2023 0913   MCV 97.6 08/25/2023 0913   MCH 33.2 08/25/2023 0913   MCHC 34.0 08/25/2023 0913   RDW 12.1 08/25/2023 0913   LYMPHSABS 1.4 08/25/2023 0913   MONOABS 0.6 08/25/2023 0913   EOSABS 0.1 08/25/2023 0913   BASOSABS 0.0 08/25/2023 0913    CMP     Component Value Date/Time   NA 141 08/25/2023 0913   K 4.3 08/25/2023 0913   CL 107 08/25/2023 0913  CO2 29 08/25/2023 0913   GLUCOSE 114 (H) 08/25/2023 0913   BUN 18 08/25/2023 0913   CREATININE 0.77 08/25/2023 0913   CREATININE 0.66 08/28/2012 1400   CALCIUM  9.2 08/25/2023 0913   PROT 6.6 08/25/2023 0913   ALBUMIN 4.3 08/25/2023 0913   AST 21 08/25/2023 0913   ALT 17 08/25/2023 0913   ALKPHOS 70 08/25/2023 0913   BILITOT 0.5 08/25/2023 0913   GFRNONAA >60 08/25/2023 0913    GFRNONAA >89 08/28/2012 1400   GFRAA >60 01/17/2019 1301   GFRAA >89 08/28/2012 1400     ASSESSMENT and THERAPY PLAN:   Metastatic carcinoma (HCC) Saranda is a 68 year old with 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  Labs today are within parameters for treatment.  PET scan pending read from radiology. Guardant reveal testing detected <0.05% circulating tumor DNA in 05/2023.  PET showed hypermetabolic IM LN, no other evidence of met disease Given single area of concern, discussed with Dr Izell, she will get radiation. We have also discussed continuing keytruda  indefinitely. We will also plan to repeat guardant reveal, anticipate repeat imaging in about 3/4 months    All questions were answered. The patient knows to call the clinic with any problems, questions or concerns. We can certainly see the patient much sooner if necessary.  Total encounter time:20 minutes*in face-to-face visit time, chart review, lab review, care coordination, order entry, and documentation of the encounter time.   *Total Encounter Time as defined by the Centers for Medicare and Medicaid Services includes, in addition to the face-to-face time of a patient visit (documented in the note above) non-face-to-face time: obtaining and reviewing outside history, ordering and reviewing medications, tests or procedures, care coordination (communications with other health care professionals or caregivers) and documentation in the medical record.

## 2023-09-28 ENCOUNTER — Ambulatory Visit

## 2023-09-28 ENCOUNTER — Ambulatory Visit
Admission: RE | Admit: 2023-09-28 | Discharge: 2023-09-28 | Disposition: A | Discharge status: Home / Self Care | Source: Ambulatory Visit | Attending: Radiation Oncology

## 2023-09-28 ENCOUNTER — Other Ambulatory Visit: Payer: Self-pay

## 2023-09-28 DIAGNOSIS — Z510 Encounter for antineoplastic radiation therapy: Secondary | ICD-10-CM | POA: Diagnosis not present

## 2023-09-28 LAB — RAD ONC ARIA SESSION SUMMARY
Course Elapsed Days: 0
Plan Fractions Treated to Date: 1
Plan Prescribed Dose Per Fraction: 5 Gy
Plan Total Fractions Prescribed: 10
Plan Total Prescribed Dose: 50 Gy
Reference Point Dosage Given to Date: 5 Gy
Reference Point Session Dosage Given: 5 Gy
Session Number: 1

## 2023-09-29 ENCOUNTER — Other Ambulatory Visit: Payer: Self-pay

## 2023-09-29 ENCOUNTER — Ambulatory Visit
Admission: RE | Admit: 2023-09-29 | Discharge: 2023-09-29 | Disposition: A | Discharge status: Home / Self Care | Source: Ambulatory Visit | Attending: Radiation Oncology | Admitting: Radiation Oncology

## 2023-09-29 DIAGNOSIS — Z510 Encounter for antineoplastic radiation therapy: Secondary | ICD-10-CM | POA: Diagnosis not present

## 2023-09-29 LAB — RAD ONC ARIA SESSION SUMMARY
Course Elapsed Days: 1
Plan Fractions Treated to Date: 2
Plan Prescribed Dose Per Fraction: 5 Gy
Plan Total Fractions Prescribed: 10
Plan Total Prescribed Dose: 50 Gy
Reference Point Dosage Given to Date: 10 Gy
Reference Point Session Dosage Given: 5 Gy
Session Number: 2

## 2023-09-30 ENCOUNTER — Other Ambulatory Visit: Payer: Self-pay

## 2023-09-30 ENCOUNTER — Ambulatory Visit
Admission: RE | Admit: 2023-09-30 | Discharge: 2023-09-30 | Disposition: A | Discharge status: Home / Self Care | Source: Ambulatory Visit | Attending: Radiation Oncology | Admitting: Radiation Oncology

## 2023-09-30 DIAGNOSIS — Z1722 Progesterone receptor negative status: Secondary | ICD-10-CM | POA: Insufficient documentation

## 2023-09-30 DIAGNOSIS — Z86000 Personal history of in-situ neoplasm of breast: Secondary | ICD-10-CM | POA: Diagnosis not present

## 2023-09-30 DIAGNOSIS — Z510 Encounter for antineoplastic radiation therapy: Secondary | ICD-10-CM | POA: Insufficient documentation

## 2023-09-30 DIAGNOSIS — Z171 Estrogen receptor negative status [ER-]: Secondary | ICD-10-CM | POA: Insufficient documentation

## 2023-09-30 DIAGNOSIS — C801 Malignant (primary) neoplasm, unspecified: Secondary | ICD-10-CM | POA: Diagnosis present

## 2023-09-30 DIAGNOSIS — C784 Secondary malignant neoplasm of small intestine: Secondary | ICD-10-CM | POA: Diagnosis present

## 2023-09-30 DIAGNOSIS — Z1732 Human epidermal growth factor receptor 2 negative status: Secondary | ICD-10-CM | POA: Insufficient documentation

## 2023-09-30 DIAGNOSIS — Z79899 Other long term (current) drug therapy: Secondary | ICD-10-CM | POA: Diagnosis not present

## 2023-09-30 DIAGNOSIS — C771 Secondary and unspecified malignant neoplasm of intrathoracic lymph nodes: Secondary | ICD-10-CM | POA: Insufficient documentation

## 2023-09-30 DIAGNOSIS — C50919 Malignant neoplasm of unspecified site of unspecified female breast: Secondary | ICD-10-CM | POA: Insufficient documentation

## 2023-09-30 DIAGNOSIS — Z5112 Encounter for antineoplastic immunotherapy: Secondary | ICD-10-CM | POA: Insufficient documentation

## 2023-09-30 LAB — RAD ONC ARIA SESSION SUMMARY
Course Elapsed Days: 2
Plan Fractions Treated to Date: 3
Plan Prescribed Dose Per Fraction: 5 Gy
Plan Total Fractions Prescribed: 10
Plan Total Prescribed Dose: 50 Gy
Reference Point Dosage Given to Date: 15 Gy
Reference Point Session Dosage Given: 5 Gy
Session Number: 3

## 2023-10-01 ENCOUNTER — Other Ambulatory Visit: Payer: Self-pay | Admitting: Hematology and Oncology

## 2023-10-01 DIAGNOSIS — C799 Secondary malignant neoplasm of unspecified site: Secondary | ICD-10-CM

## 2023-10-03 ENCOUNTER — Ambulatory Visit

## 2023-10-03 ENCOUNTER — Encounter: Payer: Self-pay | Admitting: Hematology and Oncology

## 2023-10-04 ENCOUNTER — Inpatient Hospital Stay: Admitting: Hematology and Oncology

## 2023-10-04 ENCOUNTER — Encounter: Payer: Self-pay | Admitting: Hematology and Oncology

## 2023-10-04 ENCOUNTER — Inpatient Hospital Stay

## 2023-10-04 ENCOUNTER — Ambulatory Visit
Admission: RE | Admit: 2023-10-04 | Discharge: 2023-10-04 | Disposition: A | Discharge status: Home / Self Care | Source: Ambulatory Visit | Attending: Radiation Oncology | Admitting: Radiation Oncology

## 2023-10-04 ENCOUNTER — Inpatient Hospital Stay: Attending: Hematology and Oncology

## 2023-10-04 ENCOUNTER — Other Ambulatory Visit: Payer: Self-pay

## 2023-10-04 VITALS — BP 117/85 | HR 77 | Temp 98.2°F | Resp 18 | Wt 157.4 lb

## 2023-10-04 DIAGNOSIS — Z95828 Presence of other vascular implants and grafts: Secondary | ICD-10-CM

## 2023-10-04 DIAGNOSIS — Z79899 Other long term (current) drug therapy: Secondary | ICD-10-CM | POA: Insufficient documentation

## 2023-10-04 DIAGNOSIS — C799 Secondary malignant neoplasm of unspecified site: Secondary | ICD-10-CM

## 2023-10-04 DIAGNOSIS — C50911 Malignant neoplasm of unspecified site of right female breast: Secondary | ICD-10-CM

## 2023-10-04 DIAGNOSIS — C801 Malignant (primary) neoplasm, unspecified: Secondary | ICD-10-CM | POA: Insufficient documentation

## 2023-10-04 DIAGNOSIS — C784 Secondary malignant neoplasm of small intestine: Secondary | ICD-10-CM | POA: Insufficient documentation

## 2023-10-04 DIAGNOSIS — Z5112 Encounter for antineoplastic immunotherapy: Secondary | ICD-10-CM | POA: Diagnosis not present

## 2023-10-04 LAB — CBC WITH DIFFERENTIAL (CANCER CENTER ONLY)
Abs Immature Granulocytes: 0.01 K/uL (ref 0.00–0.07)
Basophils Absolute: 0 K/uL (ref 0.0–0.1)
Basophils Relative: 1 %
Eosinophils Absolute: 0.1 K/uL (ref 0.0–0.5)
Eosinophils Relative: 2 %
HCT: 38.4 % (ref 36.0–46.0)
Hemoglobin: 13.3 g/dL (ref 12.0–15.0)
Immature Granulocytes: 0 %
Lymphocytes Relative: 31 %
Lymphs Abs: 1.2 K/uL (ref 0.7–4.0)
MCH: 33.7 pg (ref 26.0–34.0)
MCHC: 34.6 g/dL (ref 30.0–36.0)
MCV: 97.2 fL (ref 80.0–100.0)
Monocytes Absolute: 0.4 K/uL (ref 0.1–1.0)
Monocytes Relative: 10 %
Neutro Abs: 2.2 K/uL (ref 1.7–7.7)
Neutrophils Relative %: 56 %
Platelet Count: 205 K/uL (ref 150–400)
RBC: 3.95 MIL/uL (ref 3.87–5.11)
RDW: 12.1 % (ref 11.5–15.5)
WBC Count: 3.9 K/uL — ABNORMAL LOW (ref 4.0–10.5)
nRBC: 0 % (ref 0.0–0.2)

## 2023-10-04 LAB — RAD ONC ARIA SESSION SUMMARY
Course Elapsed Days: 6
Plan Fractions Treated to Date: 4
Plan Prescribed Dose Per Fraction: 5 Gy
Plan Total Fractions Prescribed: 10
Plan Total Prescribed Dose: 50 Gy
Reference Point Dosage Given to Date: 20 Gy
Reference Point Session Dosage Given: 5 Gy
Session Number: 4

## 2023-10-04 LAB — CMP (CANCER CENTER ONLY)
ALT: 15 U/L (ref 0–44)
AST: 20 U/L (ref 15–41)
Albumin: 4.4 g/dL (ref 3.5–5.0)
Alkaline Phosphatase: 79 U/L (ref 38–126)
Anion gap: 3 — ABNORMAL LOW (ref 5–15)
BUN: 19 mg/dL (ref 8–23)
CO2: 31 mmol/L (ref 22–32)
Calcium: 9.4 mg/dL (ref 8.9–10.3)
Chloride: 107 mmol/L (ref 98–111)
Creatinine: 0.75 mg/dL (ref 0.44–1.00)
GFR, Estimated: >60 mL/min (ref >60)
Glucose, Bld: 99 mg/dL (ref 70–99)
Potassium: 4.4 mmol/L (ref 3.5–5.1)
Sodium: 141 mmol/L (ref 135–145)
Total Bilirubin: 0.5 mg/dL (ref 0.0–1.2)
Total Protein: 6.7 g/dL (ref 6.5–8.1)

## 2023-10-04 MED ORDER — SODIUM CHLORIDE 0.9 % IV SOLN
400.0000 mg | Freq: Once | INTRAVENOUS | Status: AC
  Administered 2023-10-04: 400 mg via INTRAVENOUS
  Filled 2023-10-04: qty 16

## 2023-10-04 MED ORDER — SODIUM CHLORIDE 0.9% FLUSH
10.0000 mL | Freq: Once | INTRAVENOUS | Status: AC
  Administered 2023-10-04: 10 mL

## 2023-10-04 MED ORDER — SODIUM CHLORIDE 0.9% FLUSH: 10.0000 mL | INTRAVENOUS | Status: DC | PRN

## 2023-10-04 MED ORDER — SODIUM CHLORIDE 0.9 % IV SOLN
Freq: Once | INTRAVENOUS | Status: AC
Start: 2023-10-04 — End: 2023-10-04

## 2023-10-04 NOTE — Patient Instructions (Signed)
 [facility] - A DEPT OF [facility]  Discharge Instructions: Thank you for choosing [facility] to provide your oncology and hematology care.   If you have a lab appointment with the Cancer Center, please go directly to the Cancer Center and check in at the registration area.   Wear comfortable clothing and clothing appropriate for easy access to any Portacath or PICC line.   We strive to give you quality time with your provider. You may need to reschedule your appointment if you arrive late (15 or more minutes).  Arriving late affects you and other patients whose appointments are after yours.  Also, if you miss three or more appointments without notifying the office, you may be dismissed from the clinic at the provider's discretion.      For prescription refill requests, have your pharmacy contact our office and allow 72 hours for refills to be completed.    Today you received the following chemotherapy and/or immunotherapy agents :  Pembrolizumab       To help prevent nausea and vomiting after your treatment, we encourage you to take your nausea medication as directed.  BELOW ARE SYMPTOMS THAT SHOULD BE REPORTED IMMEDIATELY: *FEVER GREATER THAN 100.4 F (38 C) OR HIGHER *CHILLS OR SWEATING *NAUSEA AND VOMITING THAT IS NOT CONTROLLED WITH YOUR NAUSEA MEDICATION *UNUSUAL SHORTNESS OF BREATH *UNUSUAL BRUISING OR BLEEDING *URINARY PROBLEMS (pain or burning when urinating, or frequent urination) *BOWEL PROBLEMS (unusual diarrhea, constipation, pain near the anus) TENDERNESS IN MOUTH AND THROAT WITH OR WITHOUT PRESENCE OF ULCERS (sore throat, sores in mouth, or a toothache) UNUSUAL RASH, SWELLING OR PAIN  UNUSUAL VAGINAL DISCHARGE OR ITCHING   Items with * indicate a potential emergency and should be followed up as soon as possible or go to the Emergency Department if any problems should occur.  Please show the CHEMOTHERAPY ALERT CARD or  IMMUNOTHERAPY ALERT CARD at check-in to the Emergency Department and triage nurse.  Should you have questions after your visit or need to cancel or reschedule your appointment, please contact [facility] - A DEPT [facility]  Dept: [phone]  and follow the prompts.  Office hours are 8:00 a.m. to 4:30 p.m. Monday - Friday. Please note that voicemails left after 4:00 p.m. may not be returned until the following business day.  We are closed weekends and major holidays. You have access to a nurse at all times for urgent questions. Please call the main number to the clinic Dept: [phone] and follow the prompts.   For any non-urgent questions, you may also contact your provider using MyChart. We now offer e-Visits for anyone 25 and older to request care online for non-urgent symptoms. For details visit [email or web address].   Also download the MyChart app! Go to the app store, search "MyChart", open the app, select [facility], and log in with your MyChart username and password.

## 2023-10-05 ENCOUNTER — Ambulatory Visit
Admission: RE | Admit: 2023-10-05 | Discharge: 2023-10-05 | Disposition: A | Discharge status: Home / Self Care | Source: Ambulatory Visit | Attending: Radiation Oncology

## 2023-10-05 ENCOUNTER — Other Ambulatory Visit: Payer: Self-pay

## 2023-10-05 DIAGNOSIS — Z5112 Encounter for antineoplastic immunotherapy: Secondary | ICD-10-CM | POA: Diagnosis not present

## 2023-10-05 LAB — RAD ONC ARIA SESSION SUMMARY
Course Elapsed Days: 7
Plan Fractions Treated to Date: 5
Plan Prescribed Dose Per Fraction: 5 Gy
Plan Total Fractions Prescribed: 10
Plan Total Prescribed Dose: 50 Gy
Reference Point Dosage Given to Date: 25 Gy
Reference Point Session Dosage Given: 5 Gy
Session Number: 5

## 2023-10-06 ENCOUNTER — Ambulatory Visit
Admission: RE | Admit: 2023-10-06 | Discharge: 2023-10-06 | Disposition: A | Discharge status: Home / Self Care | Source: Ambulatory Visit | Attending: Radiation Oncology | Admitting: Radiation Oncology

## 2023-10-06 ENCOUNTER — Other Ambulatory Visit: Payer: Self-pay

## 2023-10-06 DIAGNOSIS — Z5112 Encounter for antineoplastic immunotherapy: Secondary | ICD-10-CM | POA: Diagnosis not present

## 2023-10-06 LAB — RAD ONC ARIA SESSION SUMMARY
Course Elapsed Days: 8
Plan Fractions Treated to Date: 6
Plan Prescribed Dose Per Fraction: 5 Gy
Plan Total Fractions Prescribed: 10
Plan Total Prescribed Dose: 50 Gy
Reference Point Dosage Given to Date: 30 Gy
Reference Point Session Dosage Given: 5 Gy
Session Number: 6

## 2023-10-07 ENCOUNTER — Other Ambulatory Visit: Payer: Self-pay

## 2023-10-07 ENCOUNTER — Ambulatory Visit
Admission: RE | Admit: 2023-10-07 | Discharge: 2023-10-07 | Disposition: A | Discharge status: Home / Self Care | Source: Ambulatory Visit | Attending: Radiation Oncology | Admitting: Radiation Oncology

## 2023-10-07 DIAGNOSIS — Z5112 Encounter for antineoplastic immunotherapy: Secondary | ICD-10-CM | POA: Diagnosis not present

## 2023-10-07 LAB — RAD ONC ARIA SESSION SUMMARY
Course Elapsed Days: 9
Plan Fractions Treated to Date: 7
Plan Prescribed Dose Per Fraction: 5 Gy
Plan Total Fractions Prescribed: 10
Plan Total Prescribed Dose: 50 Gy
Reference Point Dosage Given to Date: 35 Gy
Reference Point Session Dosage Given: 5 Gy
Session Number: 7

## 2023-10-10 ENCOUNTER — Ambulatory Visit
Admission: RE | Admit: 2023-10-10 | Discharge: 2023-10-10 | Disposition: A | Discharge status: Home / Self Care | Source: Ambulatory Visit | Attending: Radiation Oncology | Admitting: Radiation Oncology

## 2023-10-10 ENCOUNTER — Other Ambulatory Visit: Payer: Self-pay | Admitting: Hematology and Oncology

## 2023-10-10 ENCOUNTER — Other Ambulatory Visit: Payer: Self-pay

## 2023-10-10 ENCOUNTER — Ambulatory Visit
Admission: RE | Admit: 2023-10-10 | Discharge: 2023-10-10 | Disposition: A | Discharge status: Home / Self Care | Source: Ambulatory Visit | Attending: Radiation Oncology

## 2023-10-10 DIAGNOSIS — C799 Secondary malignant neoplasm of unspecified site: Secondary | ICD-10-CM

## 2023-10-10 DIAGNOSIS — Z5112 Encounter for antineoplastic immunotherapy: Secondary | ICD-10-CM | POA: Diagnosis not present

## 2023-10-10 LAB — RAD ONC ARIA SESSION SUMMARY
Course Elapsed Days: 12
Plan Fractions Treated to Date: 8
Plan Prescribed Dose Per Fraction: 5 Gy
Plan Total Fractions Prescribed: 10
Plan Total Prescribed Dose: 50 Gy
Reference Point Dosage Given to Date: 40 Gy
Reference Point Session Dosage Given: 5 Gy
Session Number: 8

## 2023-10-10 MED ORDER — LIDOCAINE-PRILOCAINE 2.5-2.5 % EX CREA: TOPICAL_CREAM | CUTANEOUS | 3 refills | Status: DC

## 2023-10-11 ENCOUNTER — Ambulatory Visit
Admission: RE | Admit: 2023-10-11 | Discharge: 2023-10-11 | Disposition: A | Discharge status: Home / Self Care | Source: Ambulatory Visit | Attending: Radiation Oncology

## 2023-10-11 ENCOUNTER — Other Ambulatory Visit: Payer: Self-pay

## 2023-10-11 DIAGNOSIS — Z5112 Encounter for antineoplastic immunotherapy: Secondary | ICD-10-CM | POA: Diagnosis not present

## 2023-10-11 LAB — RAD ONC ARIA SESSION SUMMARY
Course Elapsed Days: 13
Plan Fractions Treated to Date: 9
Plan Prescribed Dose Per Fraction: 5 Gy
Plan Total Fractions Prescribed: 10
Plan Total Prescribed Dose: 50 Gy
Reference Point Dosage Given to Date: 45 Gy
Reference Point Session Dosage Given: 5 Gy
Session Number: 9

## 2023-10-12 ENCOUNTER — Encounter: Payer: Self-pay | Admitting: Hematology and Oncology

## 2023-10-12 ENCOUNTER — Ambulatory Visit

## 2023-10-12 ENCOUNTER — Other Ambulatory Visit: Payer: Self-pay

## 2023-10-12 ENCOUNTER — Ambulatory Visit
Admission: RE | Admit: 2023-10-12 | Discharge: 2023-10-12 | Disposition: A | Discharge status: Home / Self Care | Source: Ambulatory Visit | Attending: Radiation Oncology | Admitting: Radiation Oncology

## 2023-10-12 DIAGNOSIS — Z5112 Encounter for antineoplastic immunotherapy: Secondary | ICD-10-CM | POA: Diagnosis not present

## 2023-10-12 LAB — RAD ONC ARIA SESSION SUMMARY
Course Elapsed Days: 14
Plan Fractions Treated to Date: 10
Plan Prescribed Dose Per Fraction: 5 Gy
Plan Total Fractions Prescribed: 10
Plan Total Prescribed Dose: 50 Gy
Reference Point Dosage Given to Date: 50 Gy
Reference Point Session Dosage Given: 5 Gy
Session Number: 10

## 2023-10-12 LAB — GUARDANT REVEAL

## 2023-10-13 NOTE — Radiation Completion Notes (Signed)
 Patient Name: [name], [name] MRN: [id number] Date of Birth: May 13, 1955 Referring Physician: AMBER STALLS, M.D. Date of Service: 2023-10-13 Radiation Oncologist: Lauraine Golden, M.D. 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), N=cN0, M=cM0 Intent: Curative     ==========DELIVERED PLANS==========  First Treatment Date: 2023-09-28 Last Treatment Date: 2023-10-12   Plan Name: [id number] Site: Chest Wall, Right Technique: IMRT Mode: Photon Dose Per Fraction: 5 Gy Prescribed Dose (Delivered / Prescribed): 50 Gy / 50 Gy Prescribed Fxs (Delivered / Prescribed): 10 / 10     ==========ON TREATMENT VISIT DATES========== 2023-09-28, 2023-10-10     ==========UPCOMING VISITS========== 11/15/2023 [facility] INFUSION 2HR (120) [facility] INFUSION  11/15/2023 [facility] EST PT 15 Iruku, AMBER, MD  11/15/2023 [facility] PORT FLUSH W/LAB [facility] FLUSH        ==========APPENDIX - ON TREATMENT VISIT NOTES==========   See weekly On Treatment Notes in Epic for details in the Media tab (listed as Progress notes on the On Treatment Visit Dates listed above).

## 2023-11-15 ENCOUNTER — Encounter: Payer: Self-pay | Admitting: Hematology and Oncology

## 2023-11-15 ENCOUNTER — Inpatient Hospital Stay

## 2023-11-15 ENCOUNTER — Inpatient Hospital Stay (HOSPITAL_BASED_OUTPATIENT_CLINIC_OR_DEPARTMENT_OTHER): Admitting: Hematology and Oncology

## 2023-11-15 ENCOUNTER — Inpatient Hospital Stay: Attending: Hematology and Oncology

## 2023-11-15 VITALS — BP 131/85 | HR 77 | Temp 98.1°F | Resp 18 | Wt 160.9 lb

## 2023-11-15 DIAGNOSIS — C799 Secondary malignant neoplasm of unspecified site: Secondary | ICD-10-CM | POA: Diagnosis not present

## 2023-11-15 DIAGNOSIS — C771 Secondary and unspecified malignant neoplasm of intrathoracic lymph nodes: Secondary | ICD-10-CM

## 2023-11-15 DIAGNOSIS — Z171 Estrogen receptor negative status [ER-]: Secondary | ICD-10-CM | POA: Diagnosis not present

## 2023-11-15 DIAGNOSIS — Z79899 Other long term (current) drug therapy: Secondary | ICD-10-CM | POA: Insufficient documentation

## 2023-11-15 DIAGNOSIS — C784 Secondary malignant neoplasm of small intestine: Secondary | ICD-10-CM | POA: Diagnosis present

## 2023-11-15 DIAGNOSIS — C801 Malignant (primary) neoplasm, unspecified: Secondary | ICD-10-CM | POA: Diagnosis present

## 2023-11-15 DIAGNOSIS — Z5112 Encounter for antineoplastic immunotherapy: Secondary | ICD-10-CM | POA: Insufficient documentation

## 2023-11-15 DIAGNOSIS — C50911 Malignant neoplasm of unspecified site of right female breast: Secondary | ICD-10-CM

## 2023-11-15 LAB — CBC WITH DIFFERENTIAL (CANCER CENTER ONLY)
Abs Immature Granulocytes: 0.02 K/uL (ref 0.00–0.07)
Basophils Absolute: 0 K/uL (ref 0.0–0.1)
Basophils Relative: 1 %
Eosinophils Absolute: 0.1 K/uL (ref 0.0–0.5)
Eosinophils Relative: 2 %
HCT: 37.8 % (ref 36.0–46.0)
Hemoglobin: 12.8 g/dL (ref 12.0–15.0)
Immature Granulocytes: 0 %
Lymphocytes Relative: 21 %
Lymphs Abs: 1.1 K/uL (ref 0.7–4.0)
MCH: 33 pg (ref 26.0–34.0)
MCHC: 33.9 g/dL (ref 30.0–36.0)
MCV: 97.4 fL (ref 80.0–100.0)
Monocytes Absolute: 0.4 K/uL (ref 0.1–1.0)
Monocytes Relative: 8 %
Neutro Abs: 3.7 K/uL (ref 1.7–7.7)
Neutrophils Relative %: 68 %
Platelet Count: 196 K/uL (ref 150–400)
RBC: 3.88 MIL/uL (ref 3.87–5.11)
RDW: 12 % (ref 11.5–15.5)
WBC Count: 5.4 K/uL (ref 4.0–10.5)
nRBC: 0 % (ref 0.0–0.2)

## 2023-11-15 LAB — CMP (CANCER CENTER ONLY)
ALT: 15 U/L (ref 0–44)
AST: 19 U/L (ref 15–41)
Albumin: 4.3 g/dL (ref 3.5–5.0)
Alkaline Phosphatase: 67 U/L (ref 38–126)
Anion gap: 4 — ABNORMAL LOW (ref 5–15)
BUN: 16 mg/dL (ref 8–23)
CO2: 30 mmol/L (ref 22–32)
Calcium: 9.4 mg/dL (ref 8.9–10.3)
Chloride: 107 mmol/L (ref 98–111)
Creatinine: 0.72 mg/dL (ref 0.44–1.00)
GFR, Estimated: >60 mL/min (ref >60)
Glucose, Bld: 102 mg/dL — ABNORMAL HIGH (ref 70–99)
Potassium: 4.2 mmol/L (ref 3.5–5.1)
Sodium: 141 mmol/L (ref 135–145)
Total Bilirubin: 0.5 mg/dL (ref 0.0–1.2)
Total Protein: 6.5 g/dL (ref 6.5–8.1)

## 2023-11-15 MED ORDER — SODIUM CHLORIDE 0.9 % IV SOLN
400.0000 mg | Freq: Once | INTRAVENOUS | Status: AC
  Administered 2023-11-15: 400 mg via INTRAVENOUS
  Filled 2023-11-15: qty 16

## 2023-11-15 MED ORDER — SODIUM CHLORIDE 0.9 % IV SOLN: Freq: Once | INTRAVENOUS | Status: AC

## 2023-11-15 NOTE — Progress Notes (Signed)
 [facility] Cancer Follow up:    System, Provider Not In No address on file   DIAGNOSIS:  Cancer Staging  No matching staging information was found for the patient.    SUMMARY OF ONCOLOGIC HISTORY: Oncology History  Metastatic carcinoma (HCC)  10/2021 Genetic Testing   Negative. 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  Genes tested: AIP, ALK, APC, ATM, AXIN2, BAP1, BARD1, BLM, BMPR1A, BRCA1, BRCA2, BRIP1, CDC73, CDH1, CDK4, CDKN1B, CDKN2A, CHEK2, CTNNA1, DICER1, FANCC, FH, FLCN, GALNT12, KIF1B, LZTR1, MAX, MEN1, MET, MLH1, MSH2, MSH3, MSH6, MUTYH, NBN, NF1, NF2, NTHL1, PALB2, PHOX2B, PMS2, POT1, PRKAR1A, PTCH1, PTEN, RAD51C, RAD51D, RB1, RECQL, RET, SDHA, SDHAF2, SDHB, SDHC, SDHD, SMAD4, SMARCA4, SMARCB1, SMARCE1, STK11, SUFU, TMEM127, TP53, TSC1, TSC2, VHL and XRCC2 (sequencing and deletion/duplication); EGFR, EGLN1, HOXB13, KIT, MITF, PDGFRA, POLD1, and POLE (sequencing only); EPCAM and GREM1 (deletion/duplication only).    04/08/2022 Initial Biopsy   [facility] Enteroscopy and small bowel mass biopsy: The enteroscopy showed normal esophagus, stomach and examined duodenum.  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  Histology showed sheets to partially nested neoplastic cells with expression of CAM5.2 and GATA3, negative for CDX2, could represent metastatic breast cancer.  ER negative, PR negative, HER2 2+, FISH negative.    04/14/2022 Initial Biopsy   Omental biopsy: benign omental tissue   05/02/2022 PET scan   IMPRESSION: 1. There are 2 short segments of small bowel with mild wall thickening and increased radiotracer uptake within the left upper quadrant of the abdomen and lower pelvis. Findings may represent known small-bowel metastasis and/or additional sites of disease. 2. No tracer avid nodal metastasis or solid organ metastasis. 3.  Aortic Atherosclerosis  (ICD10-I70.0).     Electronically Signed   By: Waddell Calk M.D.   On: 05/02/2022 09:49   05/17/2022 Surgery   Small bowel resection: Poorly differentiated carcinoma, 3.7cm; margins negative and 5.3cm margins negative, 1LN negative for cancer, likely breast primary   05/17/2022 Pathology Results   Foundation 1: PD-L1 CPS 60, PI3KCA mutation   06/22/2022 -  Chemotherapy   Patient is on Treatment Plan : BREAST Pembrolizumab  (200) q21d x 24 months       CURRENT THERAPY: Keytruda   INTERVAL HISTORY:  Discussed the use of AI scribe software for clinical note transcription with the patient, who gave verbal consent to proceed.  History of Present Illness [name] is a 68 year old female on maintenance keytruda  here for follow up.  [name] is a 68 year old female with cancer on immunotherapy who presents for follow-up regarding her treatment and concerns about surgery.  She has not experienced significant changes since her last visit. She is currently receiving Keytruda  every six weeks and has completed radiation therapy to the internal mammary lymph node. Her last imaging was in June, with another scan scheduled for the end of September.  She has a history of arthritis and previously underwent a procedure by Dr. Juli, who has since retired. She is experiencing joint pain, described as an ache rather than debilitating pain, which has not limited her activities. She is considering surgery for her arthritis and was told by the hand specialist that she should consult her oncologist before proceeding.  She mentions her family, including her daughter Schuyler, who is a Scientist, water quality studying sea life, and her plans to travel to the [place] with family. She has been retired for almost  two years and previously worked at [employer] and another company for twelve years.   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) 09/13/2023   Port-A-Cath in place 07/13/2022   Metastatic carcinoma (HCC) 06/06/2022   Small bowel mass 05/17/2022   SBO (small bowel obstruction) (HCC) 03/31/2022   Genetic testing 11/10/2021   Family history of breast cancer 10/27/2021   Family history of colon cancer 10/27/2021   S/P breast reconstruction, bilateral 09/30/2019   S/P mastectomy, bilateral 04/13/2019   Weight gain 09/09/2013   Encounter for counseling 11/25/2012   Screening for malignant neoplasm of cervix 11/25/2012   Menopause 10/08/2012   Vitamin D  deficiency 10/08/2012   Hyperlipemia 10/08/2012   Osteoarthritis 06/04/2012    has no known allergies.  MEDICAL HISTORY: Past Medical History:  Diagnosis Date   Arthritis    Backache, unspecified    Breast cancer (HCC)    Cancer (HCC)    breast s/p BL mastectomies   Colon polyps    Family history of breast cancer 10/27/2021   Family history of colon cancer 10/27/2021   Hyperlipidemia    Mitral valve prolapse of mother during pregnancy    PONV (postoperative nausea and vomiting)     SURGICAL HISTORY: Past Surgical History:  Procedure Laterality Date   BOWEL RESECTION N/A 05/17/2022   Procedure: SMALL BOWEL RESECTION;  Surgeon: Lyndel Deward PARAS, MD;  Location: [facility];  Service: General;  Laterality: N/A;   BREAST RECONSTRUCTION WITH PLACEMENT OF TISSUE EXPANDER AND FLEX HD (ACELLULAR HYDRATED DERMIS) Bilateral 04/05/2019   Procedure: BILATERAL BREAST RECONSTRUCTION WITH PLACEMENT OF TISSUE EXPANDER AND FLEX HD (ACELLULAR HYDRATED DERMIS);  Surgeon: Lowery Estefana RAMAN, DO;  Location: [facility];  Service: Plastics;  Laterality: Bilateral;   ENDOMETRIAL ABLATION  1998   GIVENS CAPSULE STUDY N/A 12/04/2021   Procedure: GIVENS CAPSULE STUDY;  Surgeon: Kristie Lamprey, MD;  Location: [facility];  Service: Gastroenterology;  Laterality: N/A;   IR IMAGING GUIDED PORT INSERTION  07/12/2022   KNEE SURGERY Right 2007    LAPAROSCOPY N/A 04/14/2022   Procedure: LAPAROSCOPY DIAGNOSTIC, G-TUBE PLACEMENT; OMENTUM BIOPSY; LYSIS OF ADHESIONS;  Surgeon: Lyndel Deward PARAS, MD;  Location: [facility];  Service: General;  Laterality: N/A;   LIPOSUCTION WITH LIPOFILLING Bilateral 01/30/2020   Procedure: LIPOSUCTION WITH LIPOFILLING;  Surgeon: Lowery Estefana RAMAN, DO;  Location: [facility];  Service: Plastics;  Laterality: Bilateral;  90 min, please   MASTECTOMY W/ SENTINEL NODE BIOPSY Bilateral 04/05/2019   Procedure: BILATERAL MASTECTOMIES WITH RIGHT SENTINEL LYMPH NODE BIOPSY;  Surgeon: Curvin Deward MOULD, MD;  Location: [facility];  Service: General;  Laterality: Bilateral;   REMOVAL OF BILATERAL TISSUE EXPANDERS WITH PLACEMENT OF BILATERAL BREAST IMPLANTS Bilateral 09/26/2019   Procedure: REMOVAL OF BILATERAL TISSUE EXPANDERS WITH PLACEMENT OF BILATERAL BREAST IMPLANTS;  Surgeon: Lowery Estefana RAMAN, DO;  Location:  SURGERY CENTER;  Service: Plastics;  Laterality: Bilateral;   RHINOPLASTY  1977   TONSILLECTOMY  1961    SOCIAL HISTORY: Social History   Socioeconomic History   Marital status: Married    Spouse name: Chuck   Number of children: 3   Years of education: 16+   Highest education level: Not on file  Occupational History    Employer: [employer]    Occupation: Herbalist  Tobacco Use   Smoking status: Never   Smokeless tobacco: Never  Vaping Use  Vaping status: Never Used  Substance and Sexual Activity   Alcohol use: Not Currently    Comment: 2-3/week   Drug use: No   Sexual activity: Yes    Partners: Male  Other Topics Concern   Not on file  Social History Narrative   Marital Status:  Married Midwife)    Children:  G3 P3 Daughters (Lauren, Ronal Legacy, Schuyler)    Pets:  Poodle [facility]); Cats Jenelle, Ronnald)    Living Situation: Lives with spouse.  Her daughters live in [place].    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[employer])    Education:  Best boy)    Tobacco Use/Exposure:  None    Alcohol Use:  Occasional   Drug Use:  None   Diet:  Regular   Exercise:  Walking   Hobbies:  Gardening, Shopping              Social Drivers of Health   Financial Resource Strain: Low Risk  (06/20/2023)   Received from [facility]   Overall Financial Resource Strain (CARDIA)    Difficulty of Paying Living Expenses: Not hard at all  Food Insecurity: Medium Risk (10/27/2023)   Received from [facility]   Hunger Vital Sign    Within the past 12 months, you worried that your food would run out before you got money to buy more: Never true    Within the past 12 months, the food you bought just didn't last and you didn't have money to get more. : Sometimes true  Transportation Needs: No Transportation Needs (10/27/2023)   Received from [facility]    In the past 12 months, has lack of reliable transportation kept you from medical appointments, meetings, work or from getting things needed for daily living? : No  Physical Activity: Sufficiently Active (06/20/2023)   Received from [facility]   Exercise Vital Sign    On average, how many days per week do you engage in moderate to strenuous exercise (like a brisk walk)?: 4 days    On average, how many minutes do you engage in exercise at this level?: 150+ min  Recent Concern: Physical Activity - Insufficiently Active (05/23/2023)   Received from [facility]   Exercise Vital Sign    Days of Exercise per Week: 1 day    Minutes of Exercise per Session: 60 min  Stress: No Stress Concern Present (06/20/2023)   Received from [facility] - Occupational Stress Questionnaire    Feeling of Stress : Not at all  Social Connections: Socially Integrated (06/20/2023)   Received from [facility]   Social Network    How would you rate your social network (family,  work, friends)?: Good participation with social networks  Intimate Partner Violence: Not At Risk (06/20/2023)   Received from [facility]   HITS    Over the last 12 months how often did your partner physically hurt you?: Never    Over the last 12 months how often did your partner insult you or talk down to you?: Never    Over the last 12 months how often did your partner threaten you with physical harm?: Never    Over the last 12 months how often did your partner scream or curse at you?: Never    FAMILY HISTORY: Family History  Problem Relation Age of Onset   Arthritis Mother  Colon cancer Mother 61   Melanoma Father        dx 47s; nose   Breast cancer Sister    Lung cancer Paternal Uncle        dx after 60; smoking hx   Breast cancer Maternal Grandmother        dx 37s   Hyperlipidemia Paternal Grandmother    Cancer Paternal Grandfather        unknown type; d. before 55   Colon cancer Other        MGM's mother    Review of Systems  Constitutional:  Negative for appetite change, chills, fatigue, fever and unexpected weight change.  HENT:   Negative for hearing loss, lump/mass and trouble swallowing.   Eyes:  Negative for eye problems and icterus.  Respiratory:  Negative for chest tightness, cough and shortness of breath.   Cardiovascular:  Negative for chest pain, leg swelling and palpitations.  Gastrointestinal:  Negative for abdominal distention, abdominal pain, blood in stool, constipation, diarrhea, nausea, rectal pain and vomiting.  Endocrine: Negative for hot flashes.  Genitourinary:  Negative for difficulty urinating.   Musculoskeletal:  Negative for arthralgias.  Skin:  Negative for itching and rash.  Neurological:  Negative for dizziness, extremity weakness, headaches and numbness.  Hematological:  Negative for adenopathy. Does not bruise/bleed easily.  Psychiatric/Behavioral:  Negative for depression. The patient is not nervous/anxious.       PHYSICAL  EXAMINATION   Onc Performance Status - 11/15/23 0900       KPS SCALE   KPS % SCORE Normal, no compliants, no evidence of disease          Vitals:   11/15/23 0952  BP: 131/85  Pulse: 77  Resp: 18  Temp: 98.1 F (36.7 C)  SpO2: 98%    Physical Exam Constitutional:      General: She is not in acute distress.    Appearance: Normal appearance. She is not toxic-appearing.  HENT:     Head: Normocephalic and atraumatic.     Mouth/Throat:     Mouth: Mucous membranes are moist.     Pharynx: Oropharynx is clear. No oropharyngeal exudate or posterior oropharyngeal erythema.  Eyes:     General: No scleral icterus. Cardiovascular:     Rate and Rhythm: Normal rate and regular rhythm.     Pulses: Normal pulses.     Heart sounds: Normal heart sounds.  Pulmonary:     Effort: Pulmonary effort is normal.     Breath sounds: Normal breath sounds.  Abdominal:     General: Abdomen is flat. Bowel sounds are normal. There is no distension.     Palpations: Abdomen is soft.     Tenderness: There is no abdominal tenderness.  Musculoskeletal:        General: No swelling.     Cervical back: Neck supple.  Lymphadenopathy:     Cervical: No cervical adenopathy.  Skin:    General: Skin is warm and dry.     Findings: No rash.  Neurological:     General: No focal deficit present.     Mental Status: She is alert.  Psychiatric:        Mood and Affect: Mood normal.        Behavior: Behavior normal.     LABORATORY DATA:  CBC    Component Value Date/Time   WBC 5.4 11/15/2023 0930   WBC 11.5 (H) 05/18/2022 0510   RBC 3.88 11/15/2023 0930   HGB 12.8 11/15/2023  0930   HCT 37.8 11/15/2023 0930   PLT 196 11/15/2023 0930   MCV 97.4 11/15/2023 0930   MCH 33.0 11/15/2023 0930   MCHC 33.9 11/15/2023 0930   RDW 12.0 11/15/2023 0930   LYMPHSABS 1.1 11/15/2023 0930   MONOABS 0.4 11/15/2023 0930   EOSABS 0.1 11/15/2023 0930   BASOSABS 0.0 11/15/2023 0930    CMP     Component Value  Date/Time   NA 141 11/15/2023 0930   K 4.2 11/15/2023 0930   CL 107 11/15/2023 0930   CO2 30 11/15/2023 0930   GLUCOSE 102 (H) 11/15/2023 0930   BUN 16 11/15/2023 0930   CREATININE 0.72 11/15/2023 0930   CREATININE 0.66 08/28/2012 1400   CALCIUM  9.4 11/15/2023 0930   PROT 6.5 11/15/2023 0930   ALBUMIN 4.3 11/15/2023 0930   AST 19 11/15/2023 0930   ALT 15 11/15/2023 0930   ALKPHOS 67 11/15/2023 0930   BILITOT 0.5 11/15/2023 0930   GFRNONAA >60 11/15/2023 0930   GFRNONAA >89 08/28/2012 1400   GFRAA >60 01/17/2019 1301   GFRAA >89 08/28/2012 1400     ASSESSMENT and THERAPY PLAN:   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  Labs today are within parameters for treatment.  PET scan pending read from radiology. Guardant reveal testing detected <0.05% circulating tumor DNA in 05/2023.  PET showed hypermetabolic IM LN, no other evidence of met disease Given single area of concern, discussed with Dr Izell, she will get radiation. We have also discussed continuing keytruda  indefinitely.  Assessment and Plan Assessment & Plan Metastatic breast cancer involving small bowel s.p resection and a recent Right IMLN. Completed radiation therapy to the internal mammary lymph node in June. - Order PET scan for end of September to assess disease status.  Immunotherapy with pembrolizumab  (Keytruda ) for metastatic breast cancer Clarified that Keytruda  should not delay healing or increase infection risk. Ok to proceed with keytruda  while preparing for surgical procedures. - Continue pembrolizumab  (Keytruda ) every six weeks.  RTC in 6 weeks.       All questions were answered. The patient knows to call the clinic with any problems, questions or concerns. We can certainly see the patient much sooner if  necessary.   *Total Encounter Time as defined by the Centers for Medicare and Medicaid Services includes, in addition to the face-to-face time of a patient visit (documented in the note above) non-face-to-face time: obtaining and reviewing outside history, ordering and reviewing medications, tests or procedures, care coordination (communications with other health care professionals or caregivers) and documentation in the medical record.

## 2023-11-15 NOTE — Assessment & Plan Note (Addendum)
 [name] is a 68 year old with 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  Labs today are within parameters for treatment.  PET scan pending read from radiology. Guardant reveal testing detected <0.05% circulating tumor DNA in 05/2023.  PET showed hypermetabolic IM LN, no other evidence of met disease Given single area of concern, discussed with Dr Izell, she will get radiation. We have also discussed continuing keytruda  indefinitely.  Assessment and Plan Assessment & Plan Metastatic breast cancer involving small bowel s.p resection and a recent Right IMLN. Completed radiation therapy to the internal mammary lymph node in June. - Order PET scan for end of September to assess disease status.  Immunotherapy with pembrolizumab  (Keytruda ) for metastatic breast cancer Clarified that Keytruda  should not delay healing or increase infection risk. Ok to proceed with keytruda  while preparing for surgical procedures. - Continue pembrolizumab  (Keytruda ) every six weeks.  RTC in 6 weeks.

## 2023-11-15 NOTE — Patient Instructions (Signed)
 [facility] - A DEPT OF [facility]  Discharge Instructions: Thank you for choosing [facility] to provide your oncology and hematology care.   If you have a lab appointment with the Cancer Center, please go directly to the Cancer Center and check in at the registration area.   Wear comfortable clothing and clothing appropriate for easy access to any Portacath or PICC line.   We strive to give you quality time with your provider. You may need to reschedule your appointment if you arrive late (15 or more minutes).  Arriving late affects you and other patients whose appointments are after yours.  Also, if you miss three or more appointments without notifying the office, you may be dismissed from the clinic at the provider's discretion.      For prescription refill requests, have your pharmacy contact our office and allow 72 hours for refills to be completed.    Today you received the following chemotherapy and/or immunotherapy agents :  Pembrolizumab       To help prevent nausea and vomiting after your treatment, we encourage you to take your nausea medication as directed.  BELOW ARE SYMPTOMS THAT SHOULD BE REPORTED IMMEDIATELY: *FEVER GREATER THAN 100.4 F (38 C) OR HIGHER *CHILLS OR SWEATING *NAUSEA AND VOMITING THAT IS NOT CONTROLLED WITH YOUR NAUSEA MEDICATION *UNUSUAL SHORTNESS OF BREATH *UNUSUAL BRUISING OR BLEEDING *URINARY PROBLEMS (pain or burning when urinating, or frequent urination) *BOWEL PROBLEMS (unusual diarrhea, constipation, pain near the anus) TENDERNESS IN MOUTH AND THROAT WITH OR WITHOUT PRESENCE OF ULCERS (sore throat, sores in mouth, or a toothache) UNUSUAL RASH, SWELLING OR PAIN  UNUSUAL VAGINAL DISCHARGE OR ITCHING   Items with * indicate a potential emergency and should be followed up as soon as possible or go to the Emergency Department if any problems should occur.  Please show the CHEMOTHERAPY ALERT CARD or  IMMUNOTHERAPY ALERT CARD at check-in to the Emergency Department and triage nurse.  Should you have questions after your visit or need to cancel or reschedule your appointment, please contact [facility] - A DEPT [facility]  Dept: [phone]  and follow the prompts.  Office hours are 8:00 a.m. to 4:30 p.m. Monday - Friday. Please note that voicemails left after 4:00 p.m. may not be returned until the following business day.  We are closed weekends and major holidays. You have access to a nurse at all times for urgent questions. Please call the main number to the clinic Dept: [phone] and follow the prompts.   For any non-urgent questions, you may also contact your provider using MyChart. We now offer e-Visits for anyone 25 and older to request care online for non-urgent symptoms. For details visit [email or web address].   Also download the MyChart app! Go to the app store, search "MyChart", open the app, select [facility], and log in with your MyChart username and password.

## 2023-11-16 ENCOUNTER — Other Ambulatory Visit: Payer: Self-pay

## 2023-11-24 ENCOUNTER — Ambulatory Visit (INDEPENDENT_AMBULATORY_CARE_PROVIDER_SITE_OTHER): Payer: Self-pay | Admitting: Surgical

## 2023-11-24 DIAGNOSIS — Z719 Counseling, unspecified: Secondary | ICD-10-CM

## 2023-11-24 DIAGNOSIS — Z411 Encounter for cosmetic surgery: Secondary | ICD-10-CM

## 2023-11-24 NOTE — Progress Notes (Signed)
 Botulinum Toxin Procedure Note  Procedure: Cosmetic botulinum toxin  Pre-operative Diagnosis: Dynamic rhytides  Post-operative Diagnosis: Same  Complications:  None  Brief history: The patient desires botulinum toxin injection.  She is aware of the risks including bleeding, damage to deeper structures, asymmetry, brow ptosis, eyelid ptosis, bruising. The patient understands and wishes to proceed.  Procedure: The area was prepped with alcohol and dried with a clean gauze.  Using a clean technique the botulinum toxin was diluted with 2.5 mL of bacteriostatic saline per 100 unit vial which resulted in 4 units per 0.1 mL.  Subsequently the mixture was injected in the glabellar, lateral canthal lines, forehead area with preservation of the temporal branch to the lateral eyebrow. A total of 37 Units of botulinum toxin was used. The forehead and glabellar area was injected with care to inject intramuscular only while holding pressure on the supratrochlear vessels in each area during each injection on either side of the medial corrugators. The injection proceeded vertically superiorly to the medial 2/3 of the frontalis muscle and superior 2/3 of the lateral frontalis, again with preservation of the frontal branch.  No complications were noted. Light pressure was held for 5 minutes. She was instructed explicitly in post-operative care.  Botox LOT:  [id number] EXP:  11/2025

## 2023-11-28 ENCOUNTER — Ambulatory Visit (HOSPITAL_COMMUNITY)

## 2023-12-02 ENCOUNTER — Ambulatory Visit (HOSPITAL_COMMUNITY)
Admission: RE | Admit: 2023-12-02 | Discharge: 2023-12-02 | Disposition: A | Discharge status: Home / Self Care | Source: Ambulatory Visit | Attending: Hematology and Oncology | Admitting: Hematology and Oncology

## 2023-12-02 DIAGNOSIS — C799 Secondary malignant neoplasm of unspecified site: Secondary | ICD-10-CM | POA: Diagnosis present

## 2023-12-02 DIAGNOSIS — C50911 Malignant neoplasm of unspecified site of right female breast: Secondary | ICD-10-CM | POA: Insufficient documentation

## 2023-12-02 DIAGNOSIS — Z171 Estrogen receptor negative status [ER-]: Secondary | ICD-10-CM | POA: Diagnosis present

## 2023-12-02 LAB — GLUCOSE, CAPILLARY
Glucose-Capillary: 18 mg/dL — CL (ref 70–99)
Glucose-Capillary: 97 mg/dL (ref 70–99)

## 2023-12-02 MED ORDER — FLUDEOXYGLUCOSE F - 18 (FDG) INJECTION
8.0000 | Freq: Once | INTRAVENOUS | Status: AC | PRN
Start: 2023-12-02 — End: 2023-12-02
  Administered 2023-12-02: 7.7 via INTRAVENOUS

## 2023-12-07 ENCOUNTER — Ambulatory Visit: Payer: Self-pay | Admitting: Hematology and Oncology

## 2023-12-07 ENCOUNTER — Telehealth: Payer: Self-pay | Admitting: Surgical

## 2023-12-07 NOTE — Telephone Encounter (Signed)
 Pt wants to know how much filler you will be using she wants a ballpark on what she will be spending 12-13-23

## 2023-12-07 NOTE — Telephone Encounter (Signed)
 Attempted to call pt. LVM for call back

## 2023-12-07 NOTE — Telephone Encounter (Signed)
-----   Message from Gravette Iruku sent at 12/07/2023  8:24 AM EDT ----- PET looks great, no new disease identified, Previously intramammary LN now looks good. ----- Message ----- From: Interface, Rad Results In Sent: 12/05/2023   5:17 PM EDT To: Amber Stalls, MD

## 2023-12-08 ENCOUNTER — Encounter: Payer: Self-pay | Admitting: Hematology and Oncology

## 2023-12-08 NOTE — Telephone Encounter (Signed)
 error

## 2023-12-09 ENCOUNTER — Telehealth: Payer: Self-pay | Admitting: *Deleted

## 2023-12-09 NOTE — Telephone Encounter (Signed)
 Pt called requesting changing all appts from 10/28 to the week of 11/4 or 11/11 due to an upcoming event. Scheduling message has been sent to f/u

## 2023-12-09 NOTE — Telephone Encounter (Signed)
-----   Message from Nurse Elpidio Thielen C sent at 12/09/2023 10:57 AM EDT ----- [name], this pt has appts scheduled for 10/28 and wants to reschedule to either the week of Nov 3rd or 11th? Can we get her rescheduled when possible. Thx

## 2023-12-12 ENCOUNTER — Telehealth: Payer: Self-pay

## 2023-12-12 NOTE — Telephone Encounter (Signed)
 Please give her prices for restylane lyft, juvederm voluma. 1cc.  We will plan to use 1cc and add more if needed.

## 2023-12-12 NOTE — Telephone Encounter (Signed)
 Pt called and would like to push her therapy appts out so that [place] the beach with [place]. A message has been sent to scheduling to facilitate this. She verbalized thanks and understanding.

## 2023-12-13 ENCOUNTER — Encounter: Admitting: Surgical

## 2023-12-27 ENCOUNTER — Inpatient Hospital Stay

## 2023-12-27 ENCOUNTER — Inpatient Hospital Stay: Admitting: Hematology and Oncology

## 2024-01-03 ENCOUNTER — Inpatient Hospital Stay: Attending: Hematology and Oncology

## 2024-01-03 ENCOUNTER — Inpatient Hospital Stay (HOSPITAL_BASED_OUTPATIENT_CLINIC_OR_DEPARTMENT_OTHER): Admitting: Hematology and Oncology

## 2024-01-03 ENCOUNTER — Inpatient Hospital Stay

## 2024-01-03 VITALS — BP 124/94 | HR 89 | Temp 97.8°F | Resp 16 | Wt 162.9 lb

## 2024-01-03 DIAGNOSIS — C801 Malignant (primary) neoplasm, unspecified: Secondary | ICD-10-CM | POA: Insufficient documentation

## 2024-01-03 DIAGNOSIS — C784 Secondary malignant neoplasm of small intestine: Secondary | ICD-10-CM | POA: Diagnosis present

## 2024-01-03 DIAGNOSIS — C799 Secondary malignant neoplasm of unspecified site: Secondary | ICD-10-CM | POA: Diagnosis not present

## 2024-01-03 DIAGNOSIS — C771 Secondary and unspecified malignant neoplasm of intrathoracic lymph nodes: Secondary | ICD-10-CM | POA: Diagnosis not present

## 2024-01-03 DIAGNOSIS — Z5112 Encounter for antineoplastic immunotherapy: Secondary | ICD-10-CM | POA: Diagnosis present

## 2024-01-03 LAB — CBC WITH DIFFERENTIAL (CANCER CENTER ONLY)
Abs Immature Granulocytes: 0.01 K/uL (ref 0.00–0.07)
Basophils Absolute: 0 K/uL (ref 0.0–0.1)
Basophils Relative: 1 %
Eosinophils Absolute: 0 K/uL (ref 0.0–0.5)
Eosinophils Relative: 1 %
HCT: 39.1 % (ref 36.0–46.0)
Hemoglobin: 13.4 g/dL (ref 12.0–15.0)
Immature Granulocytes: 0 %
Lymphocytes Relative: 26 %
Lymphs Abs: 1.3 K/uL (ref 0.7–4.0)
MCH: 33 pg (ref 26.0–34.0)
MCHC: 34.3 g/dL (ref 30.0–36.0)
MCV: 96.3 fL (ref 80.0–100.0)
Monocytes Absolute: 0.5 K/uL (ref 0.1–1.0)
Monocytes Relative: 9 %
Neutro Abs: 3.1 K/uL (ref 1.7–7.7)
Neutrophils Relative %: 63 %
Platelet Count: 191 K/uL (ref 150–400)
RBC: 4.06 MIL/uL (ref 3.87–5.11)
RDW: 11.9 % (ref 11.5–15.5)
WBC Count: 4.9 K/uL (ref 4.0–10.5)
nRBC: 0 % (ref 0.0–0.2)

## 2024-01-03 LAB — CMP (CANCER CENTER ONLY)
ALT: 22 U/L (ref 0–44)
AST: 25 U/L (ref 15–41)
Albumin: 4.4 g/dL (ref 3.5–5.0)
Alkaline Phosphatase: 73 U/L (ref 38–126)
Anion gap: 6 (ref 5–15)
BUN: 20 mg/dL (ref 8–23)
CO2: 28 mmol/L (ref 22–32)
Calcium: 9.4 mg/dL (ref 8.9–10.3)
Chloride: 106 mmol/L (ref 98–111)
Creatinine: 0.76 mg/dL (ref 0.44–1.00)
GFR, Estimated: >60 mL/min (ref >60)
Glucose, Bld: 105 mg/dL — ABNORMAL HIGH (ref 70–99)
Potassium: 4 mmol/L (ref 3.5–5.1)
Sodium: 140 mmol/L (ref 135–145)
Total Bilirubin: 0.5 mg/dL (ref 0.0–1.2)
Total Protein: 6.8 g/dL (ref 6.5–8.1)

## 2024-01-03 MED ORDER — SODIUM CHLORIDE 0.9 % IV SOLN
400.0000 mg | Freq: Once | INTRAVENOUS | Status: AC
  Administered 2024-01-03: 400 mg via INTRAVENOUS
  Filled 2024-01-03: qty 16

## 2024-01-03 MED ORDER — SODIUM CHLORIDE 0.9 % IV SOLN: Freq: Once | INTRAVENOUS | Status: AC

## 2024-01-03 NOTE — Progress Notes (Signed)
 [facility] Cancer Follow up:    System, Provider Not In No address on file   DIAGNOSIS:  Cancer Staging  No matching staging information was found for the patient.    SUMMARY OF ONCOLOGIC HISTORY: Oncology History  Metastatic carcinoma (HCC)  10/2021 Genetic Testing   Negative. 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  Genes tested: AIP, ALK, APC, ATM, AXIN2, BAP1, BARD1, BLM, BMPR1A, BRCA1, BRCA2, BRIP1, CDC73, CDH1, CDK4, CDKN1B, CDKN2A, CHEK2, CTNNA1, DICER1, FANCC, FH, FLCN, GALNT12, KIF1B, LZTR1, MAX, MEN1, MET, MLH1, MSH2, MSH3, MSH6, MUTYH, NBN, NF1, NF2, NTHL1, PALB2, PHOX2B, PMS2, POT1, PRKAR1A, PTCH1, PTEN, RAD51C, RAD51D, RB1, RECQL, RET, SDHA, SDHAF2, SDHB, SDHC, SDHD, SMAD4, SMARCA4, SMARCB1, SMARCE1, STK11, SUFU, TMEM127, TP53, TSC1, TSC2, VHL and XRCC2 (sequencing and deletion/duplication); EGFR, EGLN1, HOXB13, KIT, MITF, PDGFRA, POLD1, and POLE (sequencing only); EPCAM and GREM1 (deletion/duplication only).    04/08/2022 Initial Biopsy   [facility] Enteroscopy and small bowel mass biopsy: The enteroscopy showed normal esophagus, stomach and examined duodenum.  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  Histology showed sheets to partially nested neoplastic cells with expression of CAM5.2 and GATA3, negative for CDX2, could represent metastatic breast cancer.  ER negative, PR negative, HER2 2+, FISH negative.    04/14/2022 Initial Biopsy   Omental biopsy: benign omental tissue   05/02/2022 PET scan   IMPRESSION: 1. There are 2 short segments of small bowel with mild wall thickening and increased radiotracer uptake within the left upper quadrant of the abdomen and lower pelvis. Findings may represent known small-bowel metastasis and/or additional sites of disease. 2. No tracer avid nodal metastasis or solid organ metastasis. 3.  Aortic Atherosclerosis  (ICD10-I70.0).     Electronically Signed   By: Waddell Calk M.D.   On: 05/02/2022 09:49   05/17/2022 Surgery   Small bowel resection: Poorly differentiated carcinoma, 3.7cm; margins negative and 5.3cm margins negative, 1LN negative for cancer, likely breast primary   05/17/2022 Pathology Results   Foundation 1: PD-L1 CPS 60, PI3KCA mutation   06/22/2022 -  Chemotherapy   Patient is on Treatment Plan : BREAST Pembrolizumab  (200) q21d x 24 months       CURRENT THERAPY: Keytruda   INTERVAL HISTORY:  Discussed the use of AI scribe software for clinical note transcription with the patient, who gave verbal consent to proceed.  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  She continues to receive immunotherapy every six weeks.  She experiences some joint swelling, which she attributes to the immunotherapy. The swelling is minimal and virtually painless. She also noted a nodule in her thumb at the joint  Her blood work has been stable, with regular guardant reveal every 3 months.  She discusses her social activities, including recent trips to [place]  and [place] , and mentions her family, including her sister's son's upcoming wedding and her brother-in-law's vacation home. Her daughter is planning to visit for the holidays, and she has plans to go to the [employer].   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) 09/13/2023   Port-A-Cath in place 07/13/2022   Metastatic carcinoma (HCC) 06/06/2022   Small bowel mass 05/17/2022   SBO (small bowel obstruction) (HCC) 03/31/2022   Genetic testing 11/10/2021   Family  history of breast cancer 10/27/2021   Family history of colon cancer 10/27/2021   S/P breast reconstruction, bilateral 09/30/2019   S/P mastectomy, bilateral 04/13/2019   Weight gain  09/09/2013   Encounter for counseling 11/25/2012   Screening for malignant neoplasm of cervix 11/25/2012   Menopause 10/08/2012   Vitamin D  deficiency 10/08/2012   Hyperlipemia 10/08/2012   Osteoarthritis 06/04/2012    has no known allergies.  MEDICAL HISTORY: Past Medical History:  Diagnosis Date   Arthritis    Backache, unspecified    Breast cancer (HCC)    Cancer (HCC)    breast s/p BL mastectomies   Colon polyps    Family history of breast cancer 10/27/2021   Family history of colon cancer 10/27/2021   Hyperlipidemia    Mitral valve prolapse of mother during pregnancy    PONV (postoperative nausea and vomiting)     SURGICAL HISTORY: Past Surgical History:  Procedure Laterality Date   BOWEL RESECTION N/A 05/17/2022   Procedure: SMALL BOWEL RESECTION;  Surgeon: Lyndel Deward PARAS, MD;  Location: [facility];  Service: General;  Laterality: N/A;   BREAST RECONSTRUCTION WITH PLACEMENT OF TISSUE EXPANDER AND FLEX HD (ACELLULAR HYDRATED DERMIS) Bilateral 04/05/2019   Procedure: BILATERAL BREAST RECONSTRUCTION WITH PLACEMENT OF TISSUE EXPANDER AND FLEX HD (ACELLULAR HYDRATED DERMIS);  Surgeon: Lowery Estefana RAMAN, DO;  Location: [facility];  Service: Plastics;  Laterality: Bilateral;   ENDOMETRIAL ABLATION  1998   GIVENS CAPSULE STUDY N/A 12/04/2021   Procedure: GIVENS CAPSULE STUDY;  Surgeon: Kristie Lamprey, MD;  [facility];  Service: Gastroenterology;  Laterality: N/A;   IR IMAGING GUIDED PORT INSERTION  07/12/2022   KNEE SURGERY Right 2007   LAPAROSCOPY N/A 04/14/2022   Procedure: LAPAROSCOPY DIAGNOSTIC, G-TUBE PLACEMENT; OMENTUM BIOPSY; LYSIS OF ADHESIONS;  Surgeon: Lyndel Deward PARAS, MD;  Location: [facility];  Service: General;  Laterality: N/A;   LIPOSUCTION WITH LIPOFILLING Bilateral 01/30/2020   Procedure: LIPOSUCTION WITH LIPOFILLING;  Surgeon: Lowery Estefana RAMAN, DO;  Location: [facility];  Service: Plastics;  Laterality: Bilateral;   90 min, please   MASTECTOMY W/ SENTINEL NODE BIOPSY Bilateral 04/05/2019   Procedure: BILATERAL MASTECTOMIES WITH RIGHT SENTINEL LYMPH NODE BIOPSY;  Surgeon: Curvin Deward MOULD, MD;  Location:  SURGERY CENTER;  Service: General;  Laterality: Bilateral;   REMOVAL OF BILATERAL TISSUE EXPANDERS WITH PLACEMENT OF BILATERAL BREAST IMPLANTS Bilateral 09/26/2019   Procedure: REMOVAL OF BILATERAL TISSUE EXPANDERS WITH PLACEMENT OF BILATERAL BREAST IMPLANTS;  Surgeon: Lowery Estefana RAMAN, DO;  Location:  SURGERY CENTER;  Service: Plastics;  Laterality: Bilateral;   RHINOPLASTY  1977   TONSILLECTOMY  1961    SOCIAL HISTORY: Social History   Socioeconomic History   Marital status: Married    Spouse name: Actor   Number of children: 3   Years of education: 16+   Highest education level: Not on file  Occupational History    Employer: [employer]    Occupation: Herbalist  Tobacco Use   Smoking status: Never   Smokeless tobacco: Never  Vaping Use   Vaping status: Never Used  Substance and Sexual Activity   Alcohol use: Not Currently    Comment: 2-3/week   Drug use: No   Sexual activity: Yes    Partners: Male  Other Topics Concern   Not on file  Social History Narrative   Marital Status:  Married Midwife)    Children:  G3 P3 Daughters (Lauren, Ronal [place], [place])    Pets:  [facility]); Cats Jenelle, Ronnald)    Living Situation: Lives with spouse.  Her daughters live in [place].    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[employer])    Education:  [employer])    Tobacco Use/Exposure:  None    Alcohol Use:  Occasional   Drug Use:  None   Diet:  Regular   Exercise:  Walking   Hobbies:  Gardening, Shopping              Social Drivers of Health   Financial Resource Strain: Low Risk  (06/20/2023)   Received from [facility]   Overall Financial Resource Strain (CARDIA)    Difficulty  of Paying Living Expenses: Not hard at all  Food Insecurity: Medium Risk (10/27/2023)   Received from [facility]   Hunger Vital Sign    Within the past 12 months, you worried that your food would run out before you got money to buy more: Never true    Within the past 12 months, the food you bought just didn't last and you didn't have money to get more. : Sometimes true  Transportation Needs: No Transportation Needs (10/27/2023)   Received from [facility]    In the past 12 months, has lack of reliable transportation kept you from medical appointments, meetings, work or from getting things needed for daily living? : No  Physical Activity: Sufficiently Active (06/20/2023)   Received from [facility]   Exercise Vital Sign    On average, how many days per week do you engage in moderate to strenuous exercise (like a brisk walk)?: 4 days    On average, how many minutes do you engage in exercise at this level?: 150+ min  Recent Concern: Physical Activity - Insufficiently Active (05/23/2023)   Received from [facility]   Exercise Vital Sign    Days of Exercise per Week: 1 day    Minutes of Exercise per Session: 60 min  Stress: No Stress Concern Present (06/20/2023)   Received from [facility] - Occupational Stress Questionnaire    Feeling of Stress : Not at all  Social Connections: Socially Integrated (06/20/2023)   Received from [facility]   Social Network    How would you rate your social network (family, work, friends)?: Good participation with social networks  Intimate Partner Violence: Not At Risk (06/20/2023)   Received from [facility]   HITS    Over the last 12 months how often did your partner physically hurt you?: Never    Over the last 12 months how often did your partner insult you or talk down to you?: Never    Over the last 12 months how often did your partner threaten you with physical harm?: Never    Over  the last 12 months how often did your partner scream or curse at you?: Never    FAMILY HISTORY: Family History  Problem Relation Age of Onset   Arthritis Mother    Colon cancer Mother 66   Melanoma Father        dx 21s; nose   Breast cancer Sister    Lung cancer Paternal Uncle        dx after 30; smoking hx   Breast cancer Maternal Grandmother        dx 14s   Hyperlipidemia Paternal Grandmother    Cancer Paternal Grandfather  unknown type; d. before 40   Colon cancer Other        MGM's mother    Review of Systems  Constitutional:  Negative for appetite change, chills, fatigue, fever and unexpected weight change.  HENT:   Negative for hearing loss, lump/mass and trouble swallowing.   Eyes:  Negative for eye problems and icterus.  Respiratory:  Negative for chest tightness, cough and shortness of breath.   Cardiovascular:  Negative for chest pain, leg swelling and palpitations.  Gastrointestinal:  Negative for abdominal distention, abdominal pain, blood in stool, constipation, diarrhea, nausea, rectal pain and vomiting.  Endocrine: Negative for hot flashes.  Genitourinary:  Negative for difficulty urinating.   Musculoskeletal:  Negative for arthralgias.  Skin:  Negative for itching and rash.  Neurological:  Negative for dizziness, extremity weakness, headaches and numbness.  Hematological:  Negative for adenopathy. Does not bruise/bleed easily.  Psychiatric/Behavioral:  Negative for depression. The patient is not nervous/anxious.       PHYSICAL EXAMINATION     Vitals:   01/03/24 1441  BP: (!) 124/94  Pulse: 89  Resp: 16  Temp: 97.8 F (36.6 C)  SpO2: 96%    Physical Exam Constitutional:      General: She is not in acute distress.    Appearance: Normal appearance. She is not toxic-appearing.  HENT:     Head: Normocephalic and atraumatic.     Mouth/Throat:     Mouth: Mucous membranes are moist.     Pharynx: Oropharynx is clear. No oropharyngeal exudate  or posterior oropharyngeal erythema.  Eyes:     General: No scleral icterus. Cardiovascular:     Rate and Rhythm: Normal rate and regular rhythm.     Pulses: Normal pulses.     Heart sounds: Normal heart sounds.  Pulmonary:     Effort: Pulmonary effort is normal.     Breath sounds: Normal breath sounds.  Abdominal:     General: Abdomen is flat. Bowel sounds are normal. There is no distension.     Palpations: Abdomen is soft.     Tenderness: There is no abdominal tenderness.  Musculoskeletal:        General: No swelling.     Cervical back: Neck supple.  Lymphadenopathy:     Cervical: No cervical adenopathy.  Skin:    General: Skin is warm and dry.     Findings: No rash.  Neurological:     General: No focal deficit present.     Mental Status: She is alert.  Psychiatric:        Mood and Affect: Mood normal.        Behavior: Behavior normal.     LABORATORY DATA:  CBC    Component Value Date/Time   WBC 4.9 01/03/2024 1409   WBC 11.5 (H) 05/18/2022 0510   RBC 4.06 01/03/2024 1409   HGB 13.4 01/03/2024 1409   HCT 39.1 01/03/2024 1409   PLT 191 01/03/2024 1409   MCV 96.3 01/03/2024 1409   MCH 33.0 01/03/2024 1409   MCHC 34.3 01/03/2024 1409   RDW 11.9 01/03/2024 1409   LYMPHSABS 1.3 01/03/2024 1409   MONOABS 0.5 01/03/2024 1409   EOSABS 0.0 01/03/2024 1409   BASOSABS 0.0 01/03/2024 1409    CMP     Component Value Date/Time   NA 140 01/03/2024 1409   K 4.0 01/03/2024 1409   CL 106 01/03/2024 1409   CO2 28 01/03/2024 1409   GLUCOSE 105 (H) 01/03/2024 1409   BUN 20  01/03/2024 1409   CREATININE 0.76 01/03/2024 1409   CREATININE 0.66 08/28/2012 1400   CALCIUM  9.4 01/03/2024 1409   PROT 6.8 01/03/2024 1409   ALBUMIN 4.4 01/03/2024 1409   AST 25 01/03/2024 1409   ALT 22 01/03/2024 1409   ALKPHOS 73 01/03/2024 1409   BILITOT 0.5 01/03/2024 1409   GFRNONAA >60 01/03/2024 1409   GFRNONAA >89 08/28/2012 1400   GFRAA >60 01/17/2019 1301   GFRAA >89 08/28/2012 1400      ASSESSMENT and THERAPY PLAN:   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   She also received radiation to the right IM LN which was noted on a PET scan in June after guardant reveal detected circulating DNA. We have also discussed continuing keytruda  indefinitely.  Assessment and Plan Assessment & Plan  PET scan shows no evidence of disease.  Blood work stable. She tolerates immunotherapy well. - Continue immunotherapy every six weeks. - Scheduled next appointment for December 16th, 2025.  Arthralgia secondary to immunotherapy Mild joint swelling persists, likely due to immunotherapy. Symptoms are mild and managed. - Continue current management as symptoms are mild.  Surveillance with Guardant reveal Liquid biopsy results stable, levels below 0.05%. Monitoring ongoing for remission.      All questions were answered. The patient knows to call the clinic with any problems, questions or concerns. We can certainly see the patient much sooner if necessary.   *Total Encounter Time as defined by the Centers for Medicare and Medicaid Services includes, in addition to the face-to-face time of a patient visit (documented in the note above) non-face-to-face time: obtaining and reviewing outside history, ordering and reviewing medications, tests or procedures, care coordination (communications with other health care professionals or caregivers) and documentation in the medical record.

## 2024-01-03 NOTE — Assessment & Plan Note (Signed)
 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   She also received radiation to the right IM LN which was noted on a PET scan in June after guardant reveal detected circulating DNA. We have also discussed continuing keytruda  indefinitely.  Assessment and Plan Assessment & Plan  PET scan shows no evidence of disease.  Blood work stable. She tolerates immunotherapy well. - Continue immunotherapy every six weeks. - Scheduled next appointment for December 16th, 2025.  Arthralgia secondary to immunotherapy Mild joint swelling persists, likely due to immunotherapy. Symptoms are mild and managed. - Continue current management as symptoms are mild.  Surveillance with Guardant reveal Liquid biopsy results stable, levels below 0.05%. Monitoring ongoing for remission.

## 2024-01-04 ENCOUNTER — Other Ambulatory Visit: Payer: Self-pay

## 2024-01-04 ENCOUNTER — Encounter: Payer: Self-pay | Admitting: *Deleted

## 2024-01-04 ENCOUNTER — Other Ambulatory Visit: Payer: Self-pay | Admitting: *Deleted

## 2024-01-04 DIAGNOSIS — C799 Secondary malignant neoplasm of unspecified site: Secondary | ICD-10-CM

## 2024-01-04 DIAGNOSIS — C50911 Malignant neoplasm of unspecified site of right female breast: Secondary | ICD-10-CM

## 2024-01-05 ENCOUNTER — Other Ambulatory Visit: Payer: Self-pay

## 2024-01-05 ENCOUNTER — Encounter: Payer: Self-pay | Admitting: *Deleted

## 2024-01-05 NOTE — Progress Notes (Signed)
 Received message from [facility] Reveal team stating patient was non responsive to mobile phlebotomy team.  Testing will be canceled at this time.  If pt wishes to proceed in the future, orders will be placed.

## 2024-01-09 ENCOUNTER — Encounter: Payer: Self-pay | Admitting: Plastic Surgery

## 2024-01-09 ENCOUNTER — Ambulatory Visit (INDEPENDENT_AMBULATORY_CARE_PROVIDER_SITE_OTHER): Payer: Self-pay | Admitting: Plastic Surgery

## 2024-01-09 DIAGNOSIS — Z719 Counseling, unspecified: Secondary | ICD-10-CM

## 2024-01-09 NOTE — Progress Notes (Signed)
 Botulinum Toxin Procedure Note  Procedure: Cosmetic botulinum toxin   Pre-operative Diagnosis: Dynamic rhytides   Post-operative Diagnosis: Same  Complications:  None  Brief history: The patient desires botulinum toxin injection of her forehead. I discussed with the patient this proposed procedure of botulinum toxin injections, which is customized depending on the particular needs of the patient. It is performed on facial rhytids as a temporary correction. The alternatives were discussed with the patient. The risks were addressed including bleeding, scarring, infection, damage to deeper structures, asymmetry, and chronic pain, which may occur infrequently after a procedure. The individual's choice to undergo a surgical procedure is based on the comparison of risks to potential benefits. Other risks include unsatisfactory results, brow ptosis, eyelid ptosis, allergic reaction, temporary paralysis, which should go away with time, bruising, blurring disturbances and delayed healing. Botulinum toxin injections do not arrest the aging process or produce permanent tightening of the eyelid.  Operative intervention maybe necessary to maintain the results of a blepharoplasty or botulinum toxin. The patient understands and wishes to proceed.  Procedure: The area was prepped with alcohol and dried with a clean gauze. Using a clean technique, the botulinum toxin was diluted with 1.25 cc of preservative-free normal saline which was slowly injected with an 18 gauge needle in a tuberculin syringes.  A 32 gauge needles were then used to inject the botulinum toxin. This mixture allow for an aliquot of 4 units per 0.1 cc in each injection site.    Subsequently the mixture was injected in the glabellar and forehead area with preservation of the temporal branch to the lateral eyebrow as well as into each lateral canthal area beginning from the lateral orbital rim medial to the zygomaticus major in 3 separate areas. A  total of 12 Units of botulinum toxin was used. The forehead and glabellar area was injected with care to inject intramuscular only while holding pressure on the supratrochlear vessels in each area during each injection on either side of the medial corrugators. No complications were noted. Light pressure was held for 5 minutes. She was instructed explicitly in post-operative care.  Botox LOT:  [id number]

## 2024-02-07 ENCOUNTER — Ambulatory Visit: Admitting: Hematology and Oncology

## 2024-02-07 ENCOUNTER — Ambulatory Visit

## 2024-02-07 ENCOUNTER — Other Ambulatory Visit

## 2024-02-08 ENCOUNTER — Other Ambulatory Visit: Payer: Self-pay

## 2024-02-14 ENCOUNTER — Inpatient Hospital Stay

## 2024-02-14 ENCOUNTER — Inpatient Hospital Stay: Admitting: Hematology and Oncology

## 2024-02-14 ENCOUNTER — Inpatient Hospital Stay: Attending: Hematology and Oncology

## 2024-02-14 VITALS — BP 105/70 | HR 75 | Temp 97.7°F | Resp 17

## 2024-02-14 VITALS — BP 126/76 | HR 70 | Temp 97.9°F | Resp 16 | Wt 166.2 lb

## 2024-02-14 DIAGNOSIS — C50919 Malignant neoplasm of unspecified site of unspecified female breast: Secondary | ICD-10-CM | POA: Diagnosis present

## 2024-02-14 DIAGNOSIS — C799 Secondary malignant neoplasm of unspecified site: Secondary | ICD-10-CM | POA: Diagnosis not present

## 2024-02-14 DIAGNOSIS — Z171 Estrogen receptor negative status [ER-]: Secondary | ICD-10-CM | POA: Diagnosis not present

## 2024-02-14 DIAGNOSIS — Z5112 Encounter for antineoplastic immunotherapy: Secondary | ICD-10-CM | POA: Insufficient documentation

## 2024-02-14 DIAGNOSIS — Z79899 Other long term (current) drug therapy: Secondary | ICD-10-CM | POA: Diagnosis not present

## 2024-02-14 DIAGNOSIS — Z1731 Human epidermal growth factor receptor 2 positive status: Secondary | ICD-10-CM | POA: Insufficient documentation

## 2024-02-14 DIAGNOSIS — C50911 Malignant neoplasm of unspecified site of right female breast: Secondary | ICD-10-CM | POA: Diagnosis not present

## 2024-02-14 DIAGNOSIS — Z1722 Progesterone receptor negative status: Secondary | ICD-10-CM | POA: Insufficient documentation

## 2024-02-14 DIAGNOSIS — C784 Secondary malignant neoplasm of small intestine: Secondary | ICD-10-CM | POA: Diagnosis present

## 2024-02-14 LAB — CMP (CANCER CENTER ONLY)
ALT: 25 U/L (ref 0–44)
AST: 27 U/L (ref 15–41)
Albumin: 4.5 g/dL (ref 3.5–5.0)
Alkaline Phosphatase: 74 U/L (ref 38–126)
Anion gap: 9 (ref 5–15)
BUN: 17 mg/dL (ref 8–23)
CO2: 27 mmol/L (ref 22–32)
Calcium: 9.6 mg/dL (ref 8.9–10.3)
Chloride: 104 mmol/L (ref 98–111)
Creatinine: 0.83 mg/dL (ref 0.44–1.00)
GFR, Estimated: >60 mL/min (ref >60)
Glucose, Bld: 113 mg/dL — ABNORMAL HIGH (ref 70–99)
Potassium: 4.4 mmol/L (ref 3.5–5.1)
Sodium: 140 mmol/L (ref 135–145)
Total Bilirubin: 0.6 mg/dL (ref 0.0–1.2)
Total Protein: 7 g/dL (ref 6.5–8.1)

## 2024-02-14 LAB — CBC WITH DIFFERENTIAL (CANCER CENTER ONLY)
Abs Immature Granulocytes: 0.01 K/uL (ref 0.00–0.07)
Basophils Absolute: 0 K/uL (ref 0.0–0.1)
Basophils Relative: 1 %
Eosinophils Absolute: 0.1 K/uL (ref 0.0–0.5)
Eosinophils Relative: 2 %
HCT: 38.8 % (ref 36.0–46.0)
Hemoglobin: 13.2 g/dL (ref 12.0–15.0)
Immature Granulocytes: 0 %
Lymphocytes Relative: 26 %
Lymphs Abs: 1.1 K/uL (ref 0.7–4.0)
MCH: 32.9 pg (ref 26.0–34.0)
MCHC: 34 g/dL (ref 30.0–36.0)
MCV: 96.8 fL (ref 80.0–100.0)
Monocytes Absolute: 0.5 K/uL (ref 0.1–1.0)
Monocytes Relative: 11 %
Neutro Abs: 2.6 K/uL (ref 1.7–7.7)
Neutrophils Relative %: 60 %
Platelet Count: 244 K/uL (ref 150–400)
RBC: 4.01 MIL/uL (ref 3.87–5.11)
RDW: 12.1 % (ref 11.5–15.5)
WBC Count: 4.3 K/uL (ref 4.0–10.5)
nRBC: 0 % (ref 0.0–0.2)

## 2024-02-14 MED ORDER — SODIUM CHLORIDE 0.9 % IV SOLN
400.0000 mg | Freq: Once | INTRAVENOUS | Status: AC
  Administered 2024-02-14: 13:00:00 400 mg via INTRAVENOUS
  Filled 2024-02-14: qty 16

## 2024-02-14 MED ORDER — SODIUM CHLORIDE 0.9 % IV SOLN: Freq: Once | INTRAVENOUS | Status: AC

## 2024-02-14 NOTE — Patient Instructions (Signed)
 [facility] - A DEPT OF [facility]  Discharge Instructions: Thank you for choosing [facility] to provide your oncology and hematology care.   If you have a lab appointment with the Cancer Center, please go directly to the Cancer Center and check in at the registration area.   Wear comfortable clothing and clothing appropriate for easy access to any Portacath or PICC line.   We strive to give you quality time with your provider. You may need to reschedule your appointment if you arrive late (15 or more minutes).  Arriving late affects you and other patients whose appointments are after yours.  Also, if you miss three or more appointments without notifying the office, you may be dismissed from the clinic at the provider's discretion.      For prescription refill requests, have your pharmacy contact our office and allow 72 hours for refills to be completed.    Today you received the following chemotherapy and/or immunotherapy agents :  Pembrolizumab       To help prevent nausea and vomiting after your treatment, we encourage you to take your nausea medication as directed.  BELOW ARE SYMPTOMS THAT SHOULD BE REPORTED IMMEDIATELY: *FEVER GREATER THAN 100.4 F (38 C) OR HIGHER *CHILLS OR SWEATING *NAUSEA AND VOMITING THAT IS NOT CONTROLLED WITH YOUR NAUSEA MEDICATION *UNUSUAL SHORTNESS OF BREATH *UNUSUAL BRUISING OR BLEEDING *URINARY PROBLEMS (pain or burning when urinating, or frequent urination) *BOWEL PROBLEMS (unusual diarrhea, constipation, pain near the anus) TENDERNESS IN MOUTH AND THROAT WITH OR WITHOUT PRESENCE OF ULCERS (sore throat, sores in mouth, or a toothache) UNUSUAL RASH, SWELLING OR PAIN  UNUSUAL VAGINAL DISCHARGE OR ITCHING   Items with * indicate a potential emergency and should be followed up as soon as possible or go to the Emergency Department if any problems should occur.  Please show the CHEMOTHERAPY ALERT CARD or  IMMUNOTHERAPY ALERT CARD at check-in to the Emergency Department and triage nurse.  Should you have questions after your visit or need to cancel or reschedule your appointment, please contact [facility] - A DEPT [facility]  Dept: [phone]  and follow the prompts.  Office hours are 8:00 a.m. to 4:30 p.m. Monday - Friday. Please note that voicemails left after 4:00 p.m. may not be returned until the following business day.  We are closed weekends and major holidays. You have access to a nurse at all times for urgent questions. Please call the main number to the clinic Dept: [phone] and follow the prompts.   For any non-urgent questions, you may also contact your provider using MyChart. We now offer e-Visits for anyone 25 and older to request care online for non-urgent symptoms. For details visit [email or web address].   Also download the MyChart app! Go to the app store, search "MyChart", open the app, select [facility], and log in with your MyChart username and password.

## 2024-02-14 NOTE — Progress Notes (Signed)
 [facility] Cancer Follow up:    System, Provider Not In No address on file   DIAGNOSIS:  Cancer Staging  No matching staging information was found for the patient.    SUMMARY OF ONCOLOGIC HISTORY: Oncology History  Metastatic carcinoma (HCC)  10/2021 Genetic Testing   Negative. 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  Genes tested: AIP, ALK, APC, ATM, AXIN2, BAP1, BARD1, BLM, BMPR1A, BRCA1, BRCA2, BRIP1, CDC73, CDH1, CDK4, CDKN1B, CDKN2A, CHEK2, CTNNA1, DICER1, FANCC, FH, FLCN, GALNT12, KIF1B, LZTR1, MAX, MEN1, MET, MLH1, MSH2, MSH3, MSH6, MUTYH, NBN, NF1, NF2, NTHL1, PALB2, PHOX2B, PMS2, POT1, PRKAR1A, PTCH1, PTEN, RAD51C, RAD51D, RB1, RECQL, RET, SDHA, SDHAF2, SDHB, SDHC, SDHD, SMAD4, SMARCA4, SMARCB1, SMARCE1, STK11, SUFU, TMEM127, TP53, TSC1, TSC2, VHL and XRCC2 (sequencing and deletion/duplication); EGFR, EGLN1, HOXB13, KIT, MITF, PDGFRA, POLD1, and POLE (sequencing only); EPCAM and GREM1 (deletion/duplication only).    04/08/2022 Initial Biopsy   [facility] Enteroscopy and small bowel mass biopsy: The enteroscopy showed normal esophagus, stomach and examined duodenum.  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  Histology showed sheets to partially nested neoplastic cells with expression of CAM5.2 and GATA3, negative for CDX2, could represent metastatic breast cancer.  ER negative, PR negative, HER2 2+, FISH negative.    04/14/2022 Initial Biopsy   Omental biopsy: benign omental tissue   05/02/2022 PET scan   IMPRESSION: 1. There are 2 short segments of small bowel with mild wall thickening and increased radiotracer uptake within the left upper quadrant of the abdomen and lower pelvis. Findings may represent known small-bowel metastasis and/or additional sites of disease. 2. No tracer avid nodal metastasis or solid organ metastasis. 3.  Aortic Atherosclerosis  (ICD10-I70.0).     Electronically Signed   By: Waddell Calk M.D.   On: 05/02/2022 09:49   05/17/2022 Surgery   Small bowel resection: Poorly differentiated carcinoma, 3.7cm; margins negative and 5.3cm margins negative, 1LN negative for cancer, likely breast primary   05/17/2022 Pathology Results   Foundation 1: PD-L1 CPS 60, PI3KCA mutation   06/22/2022 -  Chemotherapy   Patient is on Treatment Plan : BREAST Pembrolizumab  (200) q21d x 24 months       CURRENT THERAPY: Keytruda   INTERVAL HISTORY:  Discussed the use of AI scribe software for clinical note transcription with the patient, who gave verbal consent to proceed.  History of Present Illness  She is here for follow up on keytruda  maintenance every 42 days. She had oligomet breast cancer diagnosed She gets imaging every 6 months and guardant reveal every 3 months She denies any major adverse effects No change in breathing No change in bowel habits or urinary habits No neurological complaints Rest of the pertinent 10 point ROS reviewed and neg.   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) 09/13/2023   Port-A-Cath in place 07/13/2022   Metastatic carcinoma (HCC) 06/06/2022   Small bowel mass 05/17/2022   SBO (small bowel obstruction) (HCC) 03/31/2022   Genetic testing 11/10/2021   Family history of breast cancer 10/27/2021   Family history of colon cancer 10/27/2021   S/P breast reconstruction, bilateral 09/30/2019   S/P mastectomy, bilateral 04/13/2019   Malignant neoplasm of right breast in female, estrogen receptor negative (HCC) 04/05/2019   Weight gain 09/09/2013   Encounter for counseling 11/25/2012   Screening for malignant neoplasm of cervix 11/25/2012  Menopause 10/08/2012   Vitamin D  deficiency 10/08/2012   Hyperlipemia 10/08/2012   Osteoarthritis 06/04/2012    has no known allergies.  MEDICAL HISTORY: Past Medical  History:  Diagnosis Date   Arthritis    Backache, unspecified    Breast cancer (HCC)    Cancer (HCC)    breast s/p BL mastectomies   Colon polyps    Family history of breast cancer 10/27/2021   Family history of colon cancer 10/27/2021   Hyperlipidemia    Mitral valve prolapse of mother during pregnancy    PONV (postoperative nausea and vomiting)     SURGICAL HISTORY: Past Surgical History:  Procedure Laterality Date   BOWEL RESECTION N/A 05/17/2022   Procedure: SMALL BOWEL RESECTION;  Surgeon: Lyndel Deward PARAS, MD;  Location: [facility];  Service: General;  Laterality: N/A;   BREAST RECONSTRUCTION WITH PLACEMENT OF TISSUE EXPANDER AND FLEX HD (ACELLULAR HYDRATED DERMIS) Bilateral 04/05/2019   Procedure: BILATERAL BREAST RECONSTRUCTION WITH PLACEMENT OF TISSUE EXPANDER AND FLEX HD (ACELLULAR HYDRATED DERMIS);  Surgeon: Lowery Estefana RAMAN, DO;  Location: [facility];  Service: Plastics;  Laterality: Bilateral;   ENDOMETRIAL ABLATION  1998   GIVENS CAPSULE STUDY N/A 12/04/2021   Procedure: GIVENS CAPSULE STUDY;  Surgeon: Kristie Lamprey, MD;  Location: [facility];  Service: Gastroenterology;  Laterality: N/A;   IR IMAGING GUIDED PORT INSERTION  07/12/2022   KNEE SURGERY Right 2007   LAPAROSCOPY N/A 04/14/2022   Procedure: LAPAROSCOPY DIAGNOSTIC, G-TUBE PLACEMENT; OMENTUM BIOPSY; LYSIS OF ADHESIONS;  Surgeon: Lyndel Deward PARAS, MD;  Location: [facility];  Service: General;  Laterality: N/A;   LIPOSUCTION WITH LIPOFILLING Bilateral 01/30/2020   Procedure: LIPOSUCTION WITH LIPOFILLING;  Surgeon: Lowery Estefana RAMAN, DO;  Location: [facility];  Service: Plastics;  Laterality: Bilateral;  90 min, please   MASTECTOMY W/ SENTINEL NODE BIOPSY Bilateral 04/05/2019   Procedure: BILATERAL MASTECTOMIES WITH RIGHT SENTINEL LYMPH NODE BIOPSY;  Surgeon: Curvin Deward MOULD, MD;  Location: [facility];  Service: General;  Laterality: Bilateral;   REMOVAL OF BILATERAL  TISSUE EXPANDERS WITH PLACEMENT OF BILATERAL BREAST IMPLANTS Bilateral 09/26/2019   Procedure: REMOVAL OF BILATERAL TISSUE EXPANDERS WITH PLACEMENT OF BILATERAL BREAST IMPLANTS;  Surgeon: Lowery Estefana RAMAN, DO;  Location: [facility];  Service: Plastics;  Laterality: Bilateral;   RHINOPLASTY  1977   TONSILLECTOMY  1961    SOCIAL HISTORY: Social History   Socioeconomic History   Marital status: Married    Spouse name: Actor   Number of children: 3   Years of education: 16+   Highest education level: Not on file  Occupational History    Employer: [employer]    Occupation: Herbalist  Tobacco Use   Smoking status: Never   Smokeless tobacco: Never  Vaping Use   Vaping status: Never Used  Substance and Sexual Activity   Alcohol use: Not Currently    Comment: 2-3/week   Drug use: No   Sexual activity: Yes    Partners: Male  Other Topics Concern   Not on file  Social History Narrative   Marital Status:  Married Midwife)    Children:  G3 P3 Daughters (Lauren, Ronal [place], [place])    Pets:  Poodle (Mollie); Cats Jenelle, Ronnald)    Living Situation: Lives with spouse.  Her daughters live in [place].    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[employer])    Education:  [employer])  Tobacco Use/Exposure:  None    Alcohol Use:  Occasional   Drug Use:  None   Diet:  Regular   Exercise:  Walking   Hobbies:  Gardening, Shopping              Social Drivers of Health   Tobacco Use: Low Risk (01/09/2024)   Patient History    Smoking Tobacco Use: Never    Smokeless Tobacco Use: Never    Passive Exposure: Not on file  Financial Resource Strain: Low Risk (06/20/2023)   Received from [facility]   Overall Financial Resource Strain (CARDIA)    Difficulty of Paying Living Expenses: Not hard at all  Food Insecurity: Medium Risk (10/27/2023)   Received from [facility]   Epic     Within the past 12 months, you worried that your food would run out before you got money to buy more: Never true    Within the past 12 months, the food you bought just didn't last and you didn't have money to get more. : Sometimes true  Transportation Needs: No Transportation Needs (10/27/2023)   Received from [facility]    In the past 12 months, has lack of reliable transportation kept you from medical appointments, meetings, work or from getting things needed for daily living? : No  Physical Activity: Sufficiently Active (06/20/2023)   Received from [facility]   Exercise Vital Sign    On average, how many days per week do you engage in moderate to strenuous exercise (like a brisk walk)?: 4 days    On average, how many minutes do you engage in exercise at this level?: 150+ min  Recent Concern: Physical Activity - Insufficiently Active (05/23/2023)   Received from [facility]   Exercise Vital Sign    Days of Exercise per Week: 1 day    Minutes of Exercise per Session: 60 min  Stress: No Stress Concern Present (06/20/2023)   Received from [facility] - Occupational Stress Questionnaire    Feeling of Stress : Not at all  Social Connections: Socially Integrated (06/20/2023)   Received from [facility]   Social Network    How would you rate your social network (family, work, friends)?: Good participation with social networks  Intimate Partner Violence: Not At Risk (06/20/2023)   Received from [facility]   HITS    Over the last 12 months how often did your partner physically hurt you?: Never    Over the last 12 months how often did your partner insult you or talk down to you?: Never    Over the last 12 months how often did your partner threaten you with physical harm?: Never    Over the last 12 months how often did your partner scream or curse at you?: Never  Depression (PHQ2-9): Low Risk (01/03/2024)   Depression (PHQ2-9)     PHQ-2 Score: 0  Alcohol Screen: Not on file  Housing: Low Risk (10/27/2023)   Received from [facility]   Epic    What is your living situation today?: I have a steady place to live    Think about the place you live. Do you have problems with any of the following? Choose all that apply:: None/None on this list  Utilities: Not At Risk (06/20/2023)   Received from [facility] Utilities    Threatened with loss of utilities: No  Health Literacy: Not on file  FAMILY HISTORY: Family History  Problem Relation Age of Onset   Arthritis Mother    Colon cancer Mother 26   Melanoma Father        dx 4s; nose   Breast cancer Sister    Lung cancer Paternal Uncle        dx after 40; smoking hx   Breast cancer Maternal Grandmother        dx 20s   Hyperlipidemia Paternal Grandmother    Cancer Paternal Grandfather        unknown type; d. before 87   Colon cancer Other        MGM's mother    Review of Systems  Constitutional:  Negative for appetite change, chills, fatigue, fever and unexpected weight change.  HENT:   Negative for hearing loss, lump/mass and trouble swallowing.   Eyes:  Negative for eye problems and icterus.  Respiratory:  Negative for chest tightness, cough and shortness of breath.   Cardiovascular:  Negative for chest pain, leg swelling and palpitations.  Gastrointestinal:  Negative for abdominal distention, abdominal pain, blood in stool, constipation, diarrhea, nausea, rectal pain and vomiting.  Endocrine: Negative for hot flashes.  Genitourinary:  Negative for difficulty urinating.   Musculoskeletal:  Negative for arthralgias.  Skin:  Negative for itching and rash.  Neurological:  Negative for dizziness, extremity weakness, headaches and numbness.  Hematological:  Negative for adenopathy. Does not bruise/bleed easily.  Psychiatric/Behavioral:  Negative for depression. The patient is not nervous/anxious.       PHYSICAL EXAMINATION  BP 126/76 (BP  Location: Left Arm, Patient Position: Sitting)   Pulse 70   Temp 97.9 F (36.6 C) (Temporal)   Resp 16   Wt 166 lb 3.2 oz (75.4 kg)   SpO2 100%   BMI 26.03 kg/m    Physical Exam Constitutional:      General: She is not in acute distress.    Appearance: Normal appearance. She is not toxic-appearing.  HENT:     Head: Normocephalic and atraumatic.     Mouth/Throat:     Mouth: Mucous membranes are moist.     Pharynx: Oropharynx is clear. No oropharyngeal exudate or posterior oropharyngeal erythema.  Eyes:     General: No scleral icterus. Cardiovascular:     Rate and Rhythm: Normal rate and regular rhythm.     Pulses: Normal pulses.     Heart sounds: Normal heart sounds.  Pulmonary:     Effort: Pulmonary effort is normal.     Breath sounds: Normal breath sounds.  Abdominal:     General: Abdomen is flat. Bowel sounds are normal. There is no distension.     Palpations: Abdomen is soft.     Tenderness: There is no abdominal tenderness.  Musculoskeletal:        General: No swelling.     Cervical back: Neck supple.  Lymphadenopathy:     Cervical: No cervical adenopathy.  Skin:    General: Skin is warm and dry.     Findings: No rash.  Neurological:     General: No focal deficit present.     Mental Status: She is alert.  Psychiatric:        Mood and Affect: Mood normal.        Behavior: Behavior normal.     LABORATORY DATA:  CBC    Component Value Date/Time   WBC 4.3 02/14/2024 1113   WBC 11.5 (H) 05/18/2022 0510   RBC 4.01 02/14/2024 1113   HGB 13.2  02/14/2024 1113   HCT 38.8 02/14/2024 1113   PLT 244 02/14/2024 1113   MCV 96.8 02/14/2024 1113   MCH 32.9 02/14/2024 1113   MCHC 34.0 02/14/2024 1113   RDW 12.1 02/14/2024 1113   LYMPHSABS 1.1 02/14/2024 1113   MONOABS 0.5 02/14/2024 1113   EOSABS 0.1 02/14/2024 1113   BASOSABS 0.0 02/14/2024 1113    CMP     Component Value Date/Time   NA 140 01/03/2024 1409   K 4.0 01/03/2024 1409   CL 106 01/03/2024 1409    CO2 28 01/03/2024 1409   GLUCOSE 105 (H) 01/03/2024 1409   BUN 20 01/03/2024 1409   CREATININE 0.76 01/03/2024 1409   CREATININE 0.66 08/28/2012 1400   CALCIUM  9.4 01/03/2024 1409   PROT 6.8 01/03/2024 1409   ALBUMIN 4.4 01/03/2024 1409   AST 25 01/03/2024 1409   ALT 22 01/03/2024 1409   ALKPHOS 73 01/03/2024 1409   BILITOT 0.5 01/03/2024 1409   GFRNONAA >60 01/03/2024 1409   GFRNONAA >89 08/28/2012 1400   GFRAA >60 01/17/2019 1301   GFRAA >89 08/28/2012 1400     ASSESSMENT and THERAPY PLAN:   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   She also received radiation to the right IM LN which was noted on a PET scan in June after guardant reveal detected circulating DNA. We have also discussed continuing keytruda  indefinitely.   Assessment and Plan Assessment & Plan Blood work stable. She tolerates immunotherapy well. - Continue immunotherapy every six weeks. - Scheduled next appointment for Jan 2026.   Surveillance with Guardant reveal Liquid biopsy results stable, levels below 0.05%. Monitoring ongoing for remission.  All questions were answered. The patient knows to call the clinic with any problems, questions or concerns. We can certainly see the patient much sooner if necessary.   *Total Encounter Time as defined by the Centers for Medicare and Medicaid Services includes, in addition to the face-to-face time of a patient visit (documented in the note above) non-face-to-face time: obtaining and reviewing outside history, ordering and reviewing medications, tests or procedures, care coordination (communications with other health care professionals or caregivers) and documentation in the medical record.

## 2024-02-22 ENCOUNTER — Encounter: Payer: Self-pay | Admitting: Hematology and Oncology

## 2024-02-24 LAB — GUARDANT REVEAL

## 2024-02-27 ENCOUNTER — Telehealth: Payer: Self-pay

## 2024-02-27 NOTE — Telephone Encounter (Signed)
 Per Md informed the patient,that guardant reveal results came back and the results has no change. Pt verbalized understanding of the results.

## 2024-03-27 ENCOUNTER — Inpatient Hospital Stay: Attending: Hematology and Oncology

## 2024-03-27 ENCOUNTER — Inpatient Hospital Stay

## 2024-03-27 ENCOUNTER — Inpatient Hospital Stay (HOSPITAL_BASED_OUTPATIENT_CLINIC_OR_DEPARTMENT_OTHER): Admitting: Hematology and Oncology

## 2024-03-27 VITALS — BP 110/70 | HR 76 | Temp 97.6°F | Resp 17 | Ht 67.0 in | Wt 170.2 lb

## 2024-03-27 DIAGNOSIS — C50919 Malignant neoplasm of unspecified site of unspecified female breast: Secondary | ICD-10-CM | POA: Insufficient documentation

## 2024-03-27 DIAGNOSIS — C771 Secondary and unspecified malignant neoplasm of intrathoracic lymph nodes: Secondary | ICD-10-CM | POA: Diagnosis not present

## 2024-03-27 DIAGNOSIS — C50911 Malignant neoplasm of unspecified site of right female breast: Secondary | ICD-10-CM

## 2024-03-27 DIAGNOSIS — Z5112 Encounter for antineoplastic immunotherapy: Secondary | ICD-10-CM | POA: Diagnosis present

## 2024-03-27 DIAGNOSIS — C784 Secondary malignant neoplasm of small intestine: Secondary | ICD-10-CM | POA: Diagnosis present

## 2024-03-27 DIAGNOSIS — Z171 Estrogen receptor negative status [ER-]: Secondary | ICD-10-CM

## 2024-03-27 DIAGNOSIS — C799 Secondary malignant neoplasm of unspecified site: Secondary | ICD-10-CM

## 2024-03-27 LAB — CBC WITH DIFFERENTIAL (CANCER CENTER ONLY)
Abs Immature Granulocytes: 0.01 10*3/uL (ref 0.00–0.07)
Basophils Absolute: 0.1 10*3/uL (ref 0.0–0.1)
Basophils Relative: 1 %
Eosinophils Absolute: 0.1 10*3/uL (ref 0.0–0.5)
Eosinophils Relative: 1 %
HCT: 38.4 % (ref 36.0–46.0)
Hemoglobin: 13.3 g/dL (ref 12.0–15.0)
Immature Granulocytes: 0 %
Lymphocytes Relative: 30 %
Lymphs Abs: 1.4 10*3/uL (ref 0.7–4.0)
MCH: 33.5 pg (ref 26.0–34.0)
MCHC: 34.6 g/dL (ref 30.0–36.0)
MCV: 96.7 fL (ref 80.0–100.0)
Monocytes Absolute: 0.4 10*3/uL (ref 0.1–1.0)
Monocytes Relative: 9 %
Neutro Abs: 2.8 10*3/uL (ref 1.7–7.7)
Neutrophils Relative %: 59 %
Platelet Count: 207 10*3/uL (ref 150–400)
RBC: 3.97 MIL/uL (ref 3.87–5.11)
RDW: 11.9 % (ref 11.5–15.5)
WBC Count: 4.7 10*3/uL (ref 4.0–10.5)
nRBC: 0 % (ref 0.0–0.2)

## 2024-03-27 LAB — CMP (CANCER CENTER ONLY)
ALT: 19 U/L (ref 0–44)
AST: 26 U/L (ref 15–41)
Albumin: 4.5 g/dL (ref 3.5–5.0)
Alkaline Phosphatase: 84 U/L (ref 38–126)
Anion gap: 10 (ref 5–15)
BUN: 16 mg/dL (ref 8–23)
CO2: 28 mmol/L (ref 22–32)
Calcium: 9.5 mg/dL (ref 8.9–10.3)
Chloride: 103 mmol/L (ref 98–111)
Creatinine: 0.8 mg/dL (ref 0.44–1.00)
GFR, Estimated: >60 mL/min
Glucose, Bld: 109 mg/dL — ABNORMAL HIGH (ref 70–99)
Potassium: 4.5 mmol/L (ref 3.5–5.1)
Sodium: 140 mmol/L (ref 135–145)
Total Bilirubin: 0.4 mg/dL (ref 0.0–1.2)
Total Protein: 7 g/dL (ref 6.5–8.1)

## 2024-03-27 MED ORDER — SODIUM CHLORIDE 0.9 % IV SOLN: Freq: Once | INTRAVENOUS | Status: AC

## 2024-03-27 MED ORDER — SODIUM CHLORIDE 0.9 % IV SOLN
400.0000 mg | Freq: Once | INTRAVENOUS | Status: AC
  Administered 2024-03-27: 400 mg via INTRAVENOUS
  Filled 2024-03-27: qty 16

## 2024-03-27 MED ORDER — LIDOCAINE-PRILOCAINE 2.5-2.5 % EX CREA: TOPICAL_CREAM | CUTANEOUS | 3 refills | Status: AC

## 2024-03-27 NOTE — Progress Notes (Unsigned)
 [facility] Cancer Follow up:    System, Provider Not In No address on file   DIAGNOSIS:  Cancer Staging  No matching staging information was found for the patient.    SUMMARY OF ONCOLOGIC HISTORY: Oncology History  Metastatic carcinoma (HCC)  10/2021 Genetic Testing   Negative. 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  Genes tested: AIP, ALK, APC, ATM, AXIN2, BAP1, BARD1, BLM, BMPR1A, BRCA1, BRCA2, BRIP1, CDC73, CDH1, CDK4, CDKN1B, CDKN2A, CHEK2, CTNNA1, DICER1, FANCC, FH, FLCN, GALNT12, KIF1B, LZTR1, MAX, MEN1, MET, MLH1, MSH2, MSH3, MSH6, MUTYH, NBN, NF1, NF2, NTHL1, PALB2, PHOX2B, PMS2, POT1, PRKAR1A, PTCH1, PTEN, RAD51C, RAD51D, RB1, RECQL, RET, SDHA, SDHAF2, SDHB, SDHC, SDHD, SMAD4, SMARCA4, SMARCB1, SMARCE1, STK11, SUFU, TMEM127, TP53, TSC1, TSC2, VHL and XRCC2 (sequencing and deletion/duplication); EGFR, EGLN1, HOXB13, KIT, MITF, PDGFRA, POLD1, and POLE (sequencing only); EPCAM and GREM1 (deletion/duplication only).    04/08/2022 Initial Biopsy   [facility] Enteroscopy and small bowel mass biopsy: The enteroscopy showed normal esophagus, stomach and examined duodenum.  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  Histology showed sheets to partially nested neoplastic cells with expression of CAM5.2 and GATA3, negative for CDX2, could represent metastatic breast cancer.  ER negative, PR negative, HER2 2+, FISH negative.    04/14/2022 Initial Biopsy   Omental biopsy: benign omental tissue   05/02/2022 PET scan   IMPRESSION: 1. There are 2 short segments of small bowel with mild wall thickening and increased radiotracer uptake within the left upper quadrant of the abdomen and lower pelvis. Findings may represent known small-bowel metastasis and/or additional sites of disease. 2. No tracer avid nodal metastasis or solid organ metastasis. 3.  Aortic Atherosclerosis  (ICD10-I70.0).     Electronically Signed   By: Waddell Calk M.D.   On: 05/02/2022 09:49   05/17/2022 Surgery   Small bowel resection: Poorly differentiated carcinoma, 3.7cm; margins negative and 5.3cm margins negative, 1LN negative for cancer, likely breast primary   05/17/2022 Pathology Results   Foundation 1: PD-L1 CPS 60, PI3KCA mutation   06/22/2022 -  Chemotherapy   Patient is on Treatment Plan : BREAST Pembrolizumab  (200) q21d x 24 months       CURRENT THERAPY: Keytruda   INTERVAL HISTORY:  Discussed the use of AI scribe software for clinical note transcription with the patient, who gave verbal consent to proceed.  History of Present Illness  [name] is a 69 year old female with metastatic breast cancer receiving immunotherapy who presents for routine oncology follow-up and management of pruritus and weight gain.  She continues on pembrolizumab  every 42 days for metastatic breast cancer, and her most recent ctDNA was detectable but has not risen in value. She reports no new or worsening systemic symptoms.  She reports significant pruritus and intermittent dermatitis, primarily involving the shoulder and back, characterized by erythematous and pruritic patches. These symptoms have improved but persist intermittently. She has used low-potency topical steroids and lidocaine  with partial relief, and continues to manage xerosis with emollients such as Cetaphil and CeraVe.  She has experienced a weight gain of approximately 16 pounds over the past year since initiating immunotherapy and expresses concern regarding this change. She is interested in nutritional counseling. Bowel function remains regular with the use of Miralax  and coffee, and she denies gastrointestinal complaints. She notes mild arthralgias, which she attributes to immunotherapy, but otherwise has no new complaints.   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) 09/13/2023   Port-A-Cath in place 07/13/2022   Metastatic carcinoma (HCC) 06/06/2022   Small bowel mass 05/17/2022   SBO (small bowel obstruction) (HCC) 03/31/2022   Genetic testing 11/10/2021   Family history of breast cancer 10/27/2021   Family history of colon cancer 10/27/2021   S/P breast reconstruction, bilateral 09/30/2019   S/P mastectomy, bilateral 04/13/2019   Malignant neoplasm of right breast in female, estrogen receptor negative (HCC) 04/05/2019   Weight gain 09/09/2013   Encounter for counseling 11/25/2012   Screening for malignant neoplasm of cervix 11/25/2012   Menopause 10/08/2012   Vitamin D  deficiency 10/08/2012   Hyperlipemia 10/08/2012   Osteoarthritis 06/04/2012    has no known allergies.  MEDICAL HISTORY: Past Medical History:  Diagnosis Date   Arthritis    Backache, unspecified    Breast cancer (HCC)    Cancer (HCC)    breast s/p BL mastectomies   Colon polyps    Family history of breast cancer 10/27/2021   Family history of colon cancer 10/27/2021   Hyperlipidemia    Mitral valve prolapse of mother during pregnancy    PONV (postoperative nausea and vomiting)     SURGICAL HISTORY: Past Surgical History:  Procedure Laterality Date   BOWEL RESECTION N/A 05/17/2022   Procedure: SMALL BOWEL RESECTION;  Surgeon: Lyndel Deward PARAS, MD;  Location: [facility];  Service: General;  Laterality: N/A;   BREAST RECONSTRUCTION WITH PLACEMENT OF TISSUE EXPANDER AND FLEX HD (ACELLULAR HYDRATED DERMIS) Bilateral 04/05/2019   Procedure: BILATERAL BREAST RECONSTRUCTION WITH PLACEMENT OF TISSUE EXPANDER AND FLEX HD (ACELLULAR HYDRATED DERMIS);  Surgeon: Lowery Estefana RAMAN, DO;  Location: [facility];  Service: Plastics;  Laterality: Bilateral;   ENDOMETRIAL ABLATION  1998   GIVENS CAPSULE STUDY N/A 12/04/2021   Procedure: GIVENS CAPSULE STUDY;  Surgeon: Kristie Lamprey, MD;  Location: [facility]  ENDOSCOPY;  Service: Gastroenterology;  Laterality: N/A;   IR IMAGING GUIDED PORT INSERTION  07/12/2022   KNEE SURGERY Right 2007   LAPAROSCOPY N/A 04/14/2022   Procedure: LAPAROSCOPY DIAGNOSTIC, G-TUBE PLACEMENT; OMENTUM BIOPSY; LYSIS OF ADHESIONS;  Surgeon: Lyndel Deward PARAS, MD;  Location: [facility];  Service: General;  Laterality: N/A;   LIPOSUCTION WITH LIPOFILLING Bilateral 01/30/2020   Procedure: LIPOSUCTION WITH LIPOFILLING;  Surgeon: Lowery Estefana RAMAN, DO;  Location: [facility];  Service: Plastics;  Laterality: Bilateral;  90 min, please   MASTECTOMY W/ SENTINEL NODE BIOPSY Bilateral 04/05/2019   Procedure: BILATERAL MASTECTOMIES WITH RIGHT SENTINEL LYMPH NODE BIOPSY;  Surgeon: Curvin Deward MOULD, MD;  Location: [facility];  Service: General;  Laterality: Bilateral;   REMOVAL OF BILATERAL TISSUE EXPANDERS WITH PLACEMENT OF BILATERAL BREAST IMPLANTS Bilateral 09/26/2019   Procedure: REMOVAL OF BILATERAL TISSUE EXPANDERS WITH PLACEMENT OF BILATERAL BREAST IMPLANTS;  Surgeon: Lowery Estefana RAMAN, DO;  Location:  SURGERY CENTER;  Service: Plastics;  Laterality: Bilateral;   RHINOPLASTY  1977   TONSILLECTOMY  1961    SOCIAL HISTORY: Social History   Socioeconomic History   Marital status: Married    Spouse name: Chuck   Number of children: 3   Years of education: 16+   Highest education level: Not on file  Occupational History    Employer: [employer]    Occupation: Herbalist  Tobacco Use   Smoking status: Never   Smokeless tobacco: Never  Vaping Use   Vaping status: Never Used  Substance and Sexual Activity  Alcohol use: Not Currently    Comment: 2-3/week   Drug use: No   Sexual activity: Yes    Partners: Male  Other Topics Concern   Not on file  Social History Narrative   Marital Status:  Married Midwife)    Children:  G3 P3 Daughters (Lauren, Ronal Legacy, Schuyler)    Pets:  Poodle [facility]); Cats Jenelle, Ronnald)     Living Situation: Lives with spouse.  Her daughters live in [place].    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[employer])    Education:  [employer])    Tobacco Use/Exposure:  None    Alcohol Use:  Occasional   Drug Use:  None   Diet:  Regular   Exercise:  Walking   Hobbies:  Gardening, Shopping              Social Drivers of Health   Tobacco Use: Low Risk (03/10/2024)   Received from [facility]   Patient History    Smoking Tobacco Use: Never    Smokeless Tobacco Use: Never    Passive Exposure: Not on file  Financial Resource Strain: Low Risk (06/20/2023)   Received from [facility]   Overall Financial Resource Strain (CARDIA)    Difficulty of Paying Living Expenses: Not hard at all  Food Insecurity: Medium Risk (10/27/2023)   Received from [facility]   Epic    Within the past 12 months, you worried that your food would run out before you got money to buy more: Never true    Within the past 12 months, the food you bought just didn't last and you didn't have money to get more. : Sometimes true  Transportation Needs: No Transportation Needs (10/27/2023)   Received from [facility]    In the past 12 months, has lack of reliable transportation kept you from medical appointments, meetings, work or from getting things needed for daily living? : No  Physical Activity: Sufficiently Active (06/20/2023)   Received from [facility]   Exercise Vital Sign    On average, how many days per week do you engage in moderate to strenuous exercise (like a brisk walk)?: 4 days    On average, how many minutes do you engage in exercise at this level?: 150+ min  Recent Concern: Physical Activity - Insufficiently Active (05/23/2023)   Received from [facility]   Exercise Vital Sign    Days of Exercise per Week: 1 day    Minutes of Exercise per Session: 60 min  Stress: No Stress  Concern Present (06/20/2023)   Received from [facility] - Occupational Stress Questionnaire    Feeling of Stress : Not at all  Social Connections: Socially Integrated (06/20/2023)   Received from [facility]   Social Network    How would you rate your social network (family, work, friends)?: Good participation with social networks  Intimate Partner Violence: Not At Risk (06/20/2023)   Received from [facility]   HITS    Over the last 12 months how often did your partner physically hurt you?: Never    Over the last 12 months how often did your partner insult you or talk down to you?: Never    Over the last 12 months how often did your partner threaten you with physical harm?: Never    Over the last 12 months how often did your partner scream  or curse at you?: Never  Depression (PHQ2-9): Low Risk (03/27/2024)   Depression (PHQ2-9)    PHQ-2 Score: 0  Alcohol Screen: Not on file  Housing: Low Risk (10/27/2023)   Received from [facility]   Epic    What is your living situation today?: I have a steady place to live    Think about the place you live. Do you have problems with any of the following? Choose all that apply:: None/None on this list  Utilities: Not At Risk (06/20/2023)   Received from [facility] Utilities    Threatened with loss of utilities: No  Health Literacy: Not on file    FAMILY HISTORY: Family History  Problem Relation Age of Onset   Arthritis Mother    Colon cancer Mother 74   Melanoma Father        dx 67s; nose   Breast cancer Sister    Lung cancer Paternal Uncle        dx after 70; smoking hx   Breast cancer Maternal Grandmother        dx 31s   Hyperlipidemia Paternal Grandmother    Cancer Paternal Grandfather        unknown type; d. before 59   Colon cancer Other        MGM's mother    Review of Systems  Constitutional:  Negative for appetite change, chills, fatigue, fever and  unexpected weight change.  HENT:   Negative for hearing loss, lump/mass and trouble swallowing.   Eyes:  Negative for eye problems and icterus.  Respiratory:  Negative for chest tightness, cough and shortness of breath.   Cardiovascular:  Negative for chest pain, leg swelling and palpitations.  Gastrointestinal:  Negative for abdominal distention, abdominal pain, blood in stool, constipation, diarrhea, nausea, rectal pain and vomiting.  Endocrine: Negative for hot flashes.  Genitourinary:  Negative for difficulty urinating.   Musculoskeletal:  Negative for arthralgias.  Skin:  Negative for itching and rash.  Neurological:  Negative for dizziness, extremity weakness, headaches and numbness.  Hematological:  Negative for adenopathy. Does not bruise/bleed easily.  Psychiatric/Behavioral:  Negative for depression. The patient is not nervous/anxious.       PHYSICAL EXAMINATION   Onc Performance Status - 03/27/24 1304       ECOG Perf Status   ECOG Perf Status Fully active, able to carry on all pre-disease performance without restriction      KPS SCALE   KPS % SCORE Normal, no compliants, no evidence of disease        BP 110/70 (BP Location: Left Arm)   Pulse 76   Temp 97.6 F (36.4 C)   Resp 17   Ht 5' 7 (1.702 m)   Wt 170 lb 3.2 oz (77.2 kg)   SpO2 98%   BMI 26.66 kg/m    Physical Exam Constitutional:      General: She is not in acute distress.    Appearance: Normal appearance. She is not toxic-appearing.  HENT:     Head: Normocephalic and atraumatic.     Mouth/Throat:     Mouth: Mucous membranes are moist.     Pharynx: Oropharynx is clear. No oropharyngeal exudate or posterior oropharyngeal erythema.  Eyes:     General: No scleral icterus. Cardiovascular:     Rate and Rhythm: Normal rate and regular rhythm.     Pulses: Normal pulses.     Heart sounds: Normal heart sounds.  Pulmonary:     Effort:  Pulmonary effort is normal.     Breath sounds: Normal breath  sounds.  Abdominal:     General: Abdomen is flat. Bowel sounds are normal. There is no distension.     Palpations: Abdomen is soft.     Tenderness: There is no abdominal tenderness.  Musculoskeletal:        General: No swelling.     Cervical back: Neck supple.  Lymphadenopathy:     Cervical: No cervical adenopathy.  Skin:    General: Skin is warm and dry.     Findings: No rash.  Neurological:     General: No focal deficit present.     Mental Status: She is alert.  Psychiatric:        Mood and Affect: Mood normal.        Behavior: Behavior normal.     LABORATORY DATA:  CBC    Component Value Date/Time   WBC 4.7 03/27/2024 1222   WBC 11.5 (H) 05/18/2022 0510   RBC 3.97 03/27/2024 1222   HGB 13.3 03/27/2024 1222   HCT 38.4 03/27/2024 1222   PLT 207 03/27/2024 1222   MCV 96.7 03/27/2024 1222   MCH 33.5 03/27/2024 1222   MCHC 34.6 03/27/2024 1222   RDW 11.9 03/27/2024 1222   LYMPHSABS 1.4 03/27/2024 1222   MONOABS 0.4 03/27/2024 1222   EOSABS 0.1 03/27/2024 1222   BASOSABS 0.1 03/27/2024 1222    CMP     Component Value Date/Time   NA 140 03/27/2024 1222   K 4.5 03/27/2024 1222   CL 103 03/27/2024 1222   CO2 28 03/27/2024 1222   GLUCOSE 109 (H) 03/27/2024 1222   BUN 16 03/27/2024 1222   CREATININE 0.80 03/27/2024 1222   CREATININE 0.66 08/28/2012 1400   CALCIUM  9.5 03/27/2024 1222   PROT 7.0 03/27/2024 1222   ALBUMIN 4.5 03/27/2024 1222   AST 26 03/27/2024 1222   ALT 19 03/27/2024 1222   ALKPHOS 84 03/27/2024 1222   BILITOT 0.4 03/27/2024 1222   GFRNONAA >60 03/27/2024 1222   GFRNONAA >89 08/28/2012 1400   GFRAA >60 01/17/2019 1301   GFRAA >89 08/28/2012 1400     ASSESSMENT and THERAPY PLAN:   Malignant neoplasm metastatic to internal mammary lymph nodes (HCC) Domique is a 69 year old with 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   She also received radiation to the right  IM LN which was noted on a PET scan in June after guardant reveal detected circulating DNA. We have also discussed continuing keytruda  indefinitely.  Assessment & Plan Metastatic breast carcinoma Persistent low-level ctDNA without radiographic progression. Pembrolizumab  with mild side effects. ctDNA every 3 months and imaging every 6 months for surveillance.  Atypical metastatic patterns necessitate vigilance. Chemotherapy not indicated due to low ctDNA burden. - Continued pembrolizumab  every 42 days. - Ordered PET scan for April, scheduled at March appointment. - Continued routine laboratory monitoring. - Maintained current follow-up schedule.  Pruritus and dermatitis secondary to immunotherapy Intermittent pruritus and dermatitis likely due to immunotherapy, exacerbated by xerosis. - Continued low-potency topical corticosteroid and lidocaine  as needed. - Advised regular use of emollients such as Cetaphil or CeraVe.  Abnormal weight gain Gained 16 pounds over the past year since therapy initiation. Motivated for weight management. BMI does not meet criteria for medical weight management clinic. - Referred to nutritionist for dietary counseling. - Nutritionist to contact her for scheduling.  All questions were answered. The patient  knows to call the clinic with any problems, questions or concerns. We can certainly see the patient much sooner if necessary.   *Total Encounter Time as defined by the Centers for Medicare and Medicaid Services includes, in addition to the face-to-face time of a patient visit (documented in the note above) non-face-to-face time: obtaining and reviewing outside history, ordering and reviewing medications, tests or procedures, care coordination (communications with other health care professionals or caregivers) and documentation in the medical record.

## 2024-03-27 NOTE — Patient Instructions (Signed)

## 2024-03-28 ENCOUNTER — Encounter: Payer: Self-pay | Admitting: Hematology and Oncology

## 2024-03-28 ENCOUNTER — Other Ambulatory Visit: Payer: Self-pay

## 2024-03-28 ENCOUNTER — Other Ambulatory Visit: Payer: Self-pay | Admitting: *Deleted

## 2024-03-28 DIAGNOSIS — C50911 Malignant neoplasm of unspecified site of right female breast: Secondary | ICD-10-CM

## 2024-03-28 DIAGNOSIS — C799 Secondary malignant neoplasm of unspecified site: Secondary | ICD-10-CM

## 2024-03-28 DIAGNOSIS — L308 Other specified dermatitis: Secondary | ICD-10-CM

## 2024-03-28 MED ORDER — TRIAMCINOLONE ACETONIDE 0.5 % EX CREA: TOPICAL_CREAM | Freq: Two times a day (BID) | CUTANEOUS | 1 refills | Status: AC

## 2024-03-28 NOTE — Progress Notes (Signed)
 Patient called office - patient still having itching she spoke to you about yesterday during appt. Asked if she can get rx for the creme she got at urgent care -- triamcinolone  acetonide. Dr Loretha informed and gave verbal order for refill. Contacted patient to inform of refill and confirm pharmacy.

## 2024-03-29 ENCOUNTER — Other Ambulatory Visit: Payer: Self-pay

## 2024-03-30 ENCOUNTER — Inpatient Hospital Stay: Admitting: Dietician

## 2024-03-30 NOTE — Progress Notes (Signed)
 Nutrition Follow-up:  Patient with metastatic breast carcinoma to bowel s/p resection. She is receiving Keytruda  q21d. Patient is under the care of Dr. Loretha  Last seen by this RD 07/2022  Met with patient in office. She is doing well. Tolerating immunotherapy with no reported NIS. Patient has a good appetite. Eats 3 balanced meals usually. Recalls avocado toast and boiled egg for breakfast, sometimes will skip lunch or has a protein shake. Had BBQ chicken, collards, broccoli slaw for dinner. Patient previously enjoyed working out. She and her husband were members of [employer] and enjoyed using the machines. Patient has not been as active since small bowel resection (04/2022). She has gained 16 lbs in the last year. Patient motivated to lose added weight.    Medications: reviewed   Labs: reviewed   Anthropometrics: Wt 170 lb 3.2 oz on 1/27  12/16 - 166 lb 3.2 11/4 - 162 lb 14.4 9/16 - 160 lb 14.4 oz  3/11 - 155 lb 12.8   NUTRITION DIAGNOSIS: Food and nutrition related knowledge deficit improving    INTERVENTION:  Discussed AICR recommendations for plant-based diet and physical activity  Educated on consistent daily intake, not skipping meals, and limiting daily fat intake Recommend gradual wt loss to preserve LBM  AICR handout + 1800 calorie sample menu provided   MONITORING, EVALUATION, GOAL: Pt will adhere to recommendations to maintain healthy weight   NEXT VISIT: No follow-up scheduled. Pt has contact information. Encouraged to call with nutrition questions/concerns

## 2024-04-04 ENCOUNTER — Telehealth: Payer: Self-pay

## 2024-04-04 MED ORDER — PREDNISONE 10 MG PO TABS: 10.0000 mg | ORAL_TABLET | Freq: Every day | ORAL | 0 refills | Status: AC

## 2024-04-04 NOTE — Telephone Encounter (Unsigned)
 Received call from pt stating that she has been having an ongoing rash w/ itchy spots and bumps all over that is not improving. RN attempted to call pt x1, LVM to call back.

## 2024-04-04 NOTE — Telephone Encounter (Signed)
 S/w pt per previous RN message.  She describes the rash as intermittent and moves to different places. Keeps her awake. Taking Zyrtec with no relief. Has taken benadryl  for relief/sleep. Rash returns once benadryl  wears off.   Per MD, order placed for Prednisone  10 mg 1 tab every day X 14 days and follow up scheduled for 2/19 at 1200. Pt is agreeable to this plan.

## 2024-04-06 ENCOUNTER — Encounter: Payer: Self-pay | Admitting: Hematology and Oncology

## 2024-04-19 ENCOUNTER — Inpatient Hospital Stay: Attending: Hematology and Oncology | Admitting: Hematology and Oncology

## 2024-05-08 ENCOUNTER — Inpatient Hospital Stay: Admitting: Adult Health

## 2024-05-08 ENCOUNTER — Inpatient Hospital Stay: Attending: Hematology and Oncology

## 2024-05-08 ENCOUNTER — Inpatient Hospital Stay
